# Patient Record
Sex: Male | Born: 1945
Health system: Southern US, Community
[De-identification: ages and names within clinical notes are randomized; demographics above are authoritative.]

## PROBLEM LIST (undated history)

## (undated) DIAGNOSIS — I10 Essential (primary) hypertension: Secondary | ICD-10-CM

## (undated) DIAGNOSIS — E785 Hyperlipidemia, unspecified: Secondary | ICD-10-CM

## (undated) DIAGNOSIS — J90 Pleural effusion, not elsewhere classified: Secondary | ICD-10-CM

## (undated) DIAGNOSIS — I214 Non-ST elevation (NSTEMI) myocardial infarction: Secondary | ICD-10-CM

## (undated) DIAGNOSIS — R570 Cardiogenic shock: Secondary | ICD-10-CM

## (undated) DIAGNOSIS — I209 Angina pectoris, unspecified: Secondary | ICD-10-CM

## (undated) DIAGNOSIS — R0602 Shortness of breath: Secondary | ICD-10-CM

## (undated) DIAGNOSIS — M1990 Unspecified osteoarthritis, unspecified site: Secondary | ICD-10-CM

## (undated) DIAGNOSIS — Z951 Presence of aortocoronary bypass graft: Secondary | ICD-10-CM

## (undated) DIAGNOSIS — I219 Acute myocardial infarction, unspecified: Secondary | ICD-10-CM

## (undated) DIAGNOSIS — I479 Paroxysmal tachycardia, unspecified: Secondary | ICD-10-CM

## (undated) DIAGNOSIS — J9690 Respiratory failure, unspecified, unspecified whether with hypoxia or hypercapnia: Secondary | ICD-10-CM

## (undated) DIAGNOSIS — C61 Malignant neoplasm of prostate: Secondary | ICD-10-CM

## (undated) DIAGNOSIS — R001 Bradycardia, unspecified: Secondary | ICD-10-CM

## (undated) DIAGNOSIS — R609 Edema, unspecified: Secondary | ICD-10-CM

## (undated) DIAGNOSIS — I2510 Atherosclerotic heart disease of native coronary artery without angina pectoris: Secondary | ICD-10-CM

## (undated) HISTORY — DX: Respiratory failure, unspecified, unspecified whether with hypoxia or hypercapnia: J96.90

## (undated) HISTORY — DX: Non-ST elevation (NSTEMI) myocardial infarction: I21.4

## (undated) HISTORY — DX: Presence of aortocoronary bypass graft: Z95.1

## (undated) HISTORY — DX: Malignant neoplasm of prostate: C61

## (undated) HISTORY — DX: Bradycardia, unspecified: R00.1

## (undated) HISTORY — DX: Pleural effusion, not elsewhere classified: J90

## (undated) HISTORY — DX: Shortness of breath: R06.02

## (undated) HISTORY — DX: Cardiogenic shock: R57.0

## (undated) HISTORY — DX: Hyperlipidemia, unspecified: E78.5

## (undated) HISTORY — DX: Edema, unspecified: R60.9

## (undated) HISTORY — DX: Paroxysmal tachycardia, unspecified: I47.9

## (undated) HISTORY — DX: Angina pectoris, unspecified: I20.9

---

## 2015-06-12 DIAGNOSIS — Z888 Allergy status to other drugs, medicaments and biological substances status: Secondary | ICD-10-CM | POA: Diagnosis not present

## 2015-06-12 DIAGNOSIS — Z79899 Other long term (current) drug therapy: Secondary | ICD-10-CM | POA: Diagnosis not present

## 2015-06-12 DIAGNOSIS — E860 Dehydration: Secondary | ICD-10-CM | POA: Diagnosis present

## 2015-06-12 DIAGNOSIS — Z85828 Personal history of other malignant neoplasm of skin: Secondary | ICD-10-CM | POA: Diagnosis not present

## 2015-06-12 DIAGNOSIS — J156 Pneumonia due to other aerobic Gram-negative bacteria: Secondary | ICD-10-CM | POA: Diagnosis present

## 2015-06-12 DIAGNOSIS — F329 Major depressive disorder, single episode, unspecified: Secondary | ICD-10-CM | POA: Diagnosis present

## 2015-06-12 DIAGNOSIS — I10 Essential (primary) hypertension: Secondary | ICD-10-CM | POA: Diagnosis not present

## 2015-06-12 DIAGNOSIS — K6289 Other specified diseases of anus and rectum: Secondary | ICD-10-CM | POA: Diagnosis not present

## 2015-06-12 DIAGNOSIS — K529 Noninfective gastroenteritis and colitis, unspecified: Secondary | ICD-10-CM | POA: Diagnosis not present

## 2015-06-12 DIAGNOSIS — J690 Pneumonitis due to inhalation of food and vomit: Secondary | ICD-10-CM | POA: Diagnosis not present

## 2015-06-12 DIAGNOSIS — R0902 Hypoxemia: Secondary | ICD-10-CM | POA: Diagnosis not present

## 2015-06-12 DIAGNOSIS — A419 Sepsis, unspecified organism: Secondary | ICD-10-CM | POA: Diagnosis not present

## 2015-06-12 DIAGNOSIS — K219 Gastro-esophageal reflux disease without esophagitis: Secondary | ICD-10-CM | POA: Diagnosis present

## 2015-06-12 DIAGNOSIS — K5730 Diverticulosis of large intestine without perforation or abscess without bleeding: Secondary | ICD-10-CM | POA: Diagnosis not present

## 2015-06-12 DIAGNOSIS — R1110 Vomiting, unspecified: Secondary | ICD-10-CM | POA: Diagnosis not present

## 2015-06-12 DIAGNOSIS — M1990 Unspecified osteoarthritis, unspecified site: Secondary | ICD-10-CM | POA: Diagnosis present

## 2015-06-12 DIAGNOSIS — J189 Pneumonia, unspecified organism: Secondary | ICD-10-CM | POA: Diagnosis not present

## 2015-06-12 DIAGNOSIS — J9811 Atelectasis: Secondary | ICD-10-CM | POA: Diagnosis not present

## 2015-06-12 DIAGNOSIS — R6521 Severe sepsis with septic shock: Secondary | ICD-10-CM | POA: Diagnosis not present

## 2015-06-12 DIAGNOSIS — A09 Infectious gastroenteritis and colitis, unspecified: Secondary | ICD-10-CM | POA: Diagnosis not present

## 2015-06-12 DIAGNOSIS — R112 Nausea with vomiting, unspecified: Secondary | ICD-10-CM | POA: Diagnosis not present

## 2015-06-12 DIAGNOSIS — N179 Acute kidney failure, unspecified: Secondary | ICD-10-CM | POA: Diagnosis not present

## 2015-10-29 DIAGNOSIS — M5090 Cervical disc disorder, unspecified, unspecified cervical region: Secondary | ICD-10-CM

## 2015-10-29 DIAGNOSIS — M542 Cervicalgia: Secondary | ICD-10-CM

## 2015-10-29 DIAGNOSIS — R29898 Other symptoms and signs involving the musculoskeletal system: Secondary | ICD-10-CM

## 2015-10-29 DIAGNOSIS — R531 Weakness: Secondary | ICD-10-CM

## 2015-10-29 DIAGNOSIS — R200 Anesthesia of skin: Secondary | ICD-10-CM | POA: Insufficient documentation

## 2015-10-29 DIAGNOSIS — R202 Paresthesia of skin: Secondary | ICD-10-CM | POA: Insufficient documentation

## 2015-10-29 HISTORY — DX: Cervicalgia: M54.2

## 2015-10-29 HISTORY — DX: Anesthesia of skin: R20.0

## 2015-10-29 HISTORY — DX: Weakness: R53.1

## 2015-10-29 HISTORY — DX: Other symptoms and signs involving the musculoskeletal system: R29.898

## 2015-10-29 HISTORY — DX: Cervical disc disorder, unspecified, unspecified cervical region: M50.90

## 2016-09-23 DIAGNOSIS — E785 Hyperlipidemia, unspecified: Secondary | ICD-10-CM | POA: Diagnosis present

## 2016-09-23 DIAGNOSIS — I10 Essential (primary) hypertension: Secondary | ICD-10-CM | POA: Diagnosis present

## 2016-09-23 DIAGNOSIS — W19XXXA Unspecified fall, initial encounter: Secondary | ICD-10-CM | POA: Diagnosis not present

## 2016-09-23 DIAGNOSIS — N19 Unspecified kidney failure: Secondary | ICD-10-CM | POA: Diagnosis not present

## 2016-09-23 DIAGNOSIS — K219 Gastro-esophageal reflux disease without esophagitis: Secondary | ICD-10-CM | POA: Diagnosis present

## 2016-09-23 DIAGNOSIS — Z9181 History of falling: Secondary | ICD-10-CM | POA: Diagnosis not present

## 2016-09-23 DIAGNOSIS — I214 Non-ST elevation (NSTEMI) myocardial infarction: Secondary | ICD-10-CM | POA: Diagnosis present

## 2016-09-23 DIAGNOSIS — Z79899 Other long term (current) drug therapy: Secondary | ICD-10-CM | POA: Diagnosis not present

## 2016-09-23 DIAGNOSIS — N179 Acute kidney failure, unspecified: Secondary | ICD-10-CM | POA: Diagnosis present

## 2016-09-24 ENCOUNTER — Other Ambulatory Visit: Payer: Self-pay | Admitting: *Deleted

## 2016-09-24 ENCOUNTER — Encounter (HOSPITAL_COMMUNITY)
Admission: AD | Disposition: A | Discharge status: Home Health | Payer: Self-pay | Source: Other Acute Inpatient Hospital | Attending: Cardiothoracic Surgery

## 2016-09-24 ENCOUNTER — Encounter (HOSPITAL_COMMUNITY): Payer: Self-pay | Admitting: Cardiology

## 2016-09-24 ENCOUNTER — Inpatient Hospital Stay (HOSPITAL_COMMUNITY)
Admission: AD | Admit: 2016-09-24 | Discharge: 2016-10-10 | DRG: 280 | Disposition: A | Discharge status: Home Health | Payer: BLUE CROSS/BLUE SHIELD | Source: Other Acute Inpatient Hospital | Attending: Cardiothoracic Surgery | Admitting: Cardiothoracic Surgery

## 2016-09-24 DIAGNOSIS — R0902 Hypoxemia: Secondary | ICD-10-CM

## 2016-09-24 DIAGNOSIS — I252 Old myocardial infarction: Secondary | ICD-10-CM

## 2016-09-24 DIAGNOSIS — J90 Pleural effusion, not elsewhere classified: Secondary | ICD-10-CM | POA: Diagnosis not present

## 2016-09-24 DIAGNOSIS — Z79899 Other long term (current) drug therapy: Secondary | ICD-10-CM

## 2016-09-24 DIAGNOSIS — J9601 Acute respiratory failure with hypoxia: Secondary | ICD-10-CM | POA: Diagnosis not present

## 2016-09-24 DIAGNOSIS — H9190 Unspecified hearing loss, unspecified ear: Secondary | ICD-10-CM | POA: Diagnosis present

## 2016-09-24 DIAGNOSIS — N183 Chronic kidney disease, stage 3 (moderate): Secondary | ICD-10-CM | POA: Diagnosis present

## 2016-09-24 DIAGNOSIS — E784 Other hyperlipidemia: Secondary | ICD-10-CM | POA: Diagnosis not present

## 2016-09-24 DIAGNOSIS — E8770 Fluid overload, unspecified: Secondary | ICD-10-CM | POA: Diagnosis not present

## 2016-09-24 DIAGNOSIS — D689 Coagulation defect, unspecified: Secondary | ICD-10-CM | POA: Diagnosis not present

## 2016-09-24 DIAGNOSIS — I25119 Atherosclerotic heart disease of native coronary artery with unspecified angina pectoris: Secondary | ICD-10-CM | POA: Diagnosis not present

## 2016-09-24 DIAGNOSIS — Z951 Presence of aortocoronary bypass graft: Secondary | ICD-10-CM

## 2016-09-24 DIAGNOSIS — J385 Laryngeal spasm: Secondary | ICD-10-CM | POA: Diagnosis not present

## 2016-09-24 DIAGNOSIS — J9811 Atelectasis: Secondary | ICD-10-CM | POA: Diagnosis not present

## 2016-09-24 DIAGNOSIS — Z01818 Encounter for other preprocedural examination: Secondary | ICD-10-CM

## 2016-09-24 DIAGNOSIS — R05 Cough: Secondary | ICD-10-CM | POA: Diagnosis not present

## 2016-09-24 DIAGNOSIS — Z888 Allergy status to other drugs, medicaments and biological substances status: Secondary | ICD-10-CM | POA: Diagnosis not present

## 2016-09-24 DIAGNOSIS — N179 Acute kidney failure, unspecified: Secondary | ICD-10-CM | POA: Diagnosis not present

## 2016-09-24 DIAGNOSIS — Z6835 Body mass index (BMI) 35.0-35.9, adult: Secondary | ICD-10-CM

## 2016-09-24 DIAGNOSIS — R0602 Shortness of breath: Secondary | ICD-10-CM

## 2016-09-24 DIAGNOSIS — I10 Essential (primary) hypertension: Secondary | ICD-10-CM

## 2016-09-24 DIAGNOSIS — D696 Thrombocytopenia, unspecified: Secondary | ICD-10-CM | POA: Diagnosis not present

## 2016-09-24 DIAGNOSIS — M1990 Unspecified osteoarthritis, unspecified site: Secondary | ICD-10-CM | POA: Diagnosis present

## 2016-09-24 DIAGNOSIS — R739 Hyperglycemia, unspecified: Secondary | ICD-10-CM | POA: Diagnosis not present

## 2016-09-24 DIAGNOSIS — E871 Hypo-osmolality and hyponatremia: Secondary | ICD-10-CM | POA: Diagnosis not present

## 2016-09-24 DIAGNOSIS — I25110 Atherosclerotic heart disease of native coronary artery with unstable angina pectoris: Secondary | ICD-10-CM | POA: Diagnosis present

## 2016-09-24 DIAGNOSIS — D62 Acute posthemorrhagic anemia: Secondary | ICD-10-CM | POA: Diagnosis not present

## 2016-09-24 DIAGNOSIS — I110 Hypertensive heart disease with heart failure: Secondary | ICD-10-CM | POA: Diagnosis present

## 2016-09-24 DIAGNOSIS — W19XXXA Unspecified fall, initial encounter: Secondary | ICD-10-CM

## 2016-09-24 DIAGNOSIS — E669 Obesity, unspecified: Secondary | ICD-10-CM | POA: Diagnosis present

## 2016-09-24 DIAGNOSIS — E785 Hyperlipidemia, unspecified: Secondary | ICD-10-CM | POA: Diagnosis present

## 2016-09-24 DIAGNOSIS — R0789 Other chest pain: Secondary | ICD-10-CM | POA: Diagnosis present

## 2016-09-24 DIAGNOSIS — I480 Paroxysmal atrial fibrillation: Secondary | ICD-10-CM | POA: Diagnosis present

## 2016-09-24 DIAGNOSIS — I2510 Atherosclerotic heart disease of native coronary artery without angina pectoris: Secondary | ICD-10-CM | POA: Diagnosis not present

## 2016-09-24 DIAGNOSIS — I214 Non-ST elevation (NSTEMI) myocardial infarction: Secondary | ICD-10-CM | POA: Diagnosis not present

## 2016-09-24 DIAGNOSIS — J939 Pneumothorax, unspecified: Secondary | ICD-10-CM

## 2016-09-24 DIAGNOSIS — J9801 Acute bronchospasm: Secondary | ICD-10-CM | POA: Diagnosis not present

## 2016-09-24 DIAGNOSIS — R570 Cardiogenic shock: Secondary | ICD-10-CM | POA: Diagnosis not present

## 2016-09-24 DIAGNOSIS — N19 Unspecified kidney failure: Secondary | ICD-10-CM

## 2016-09-24 DIAGNOSIS — Z881 Allergy status to other antibiotic agents status: Secondary | ICD-10-CM

## 2016-09-24 DIAGNOSIS — J9690 Respiratory failure, unspecified, unspecified whether with hypoxia or hypercapnia: Secondary | ICD-10-CM

## 2016-09-24 DIAGNOSIS — R001 Bradycardia, unspecified: Secondary | ICD-10-CM | POA: Diagnosis not present

## 2016-09-24 DIAGNOSIS — Z01810 Encounter for preprocedural cardiovascular examination: Secondary | ICD-10-CM | POA: Diagnosis not present

## 2016-09-24 DIAGNOSIS — I479 Paroxysmal tachycardia, unspecified: Secondary | ICD-10-CM | POA: Diagnosis present

## 2016-09-24 DIAGNOSIS — Z8249 Family history of ischemic heart disease and other diseases of the circulatory system: Secondary | ICD-10-CM | POA: Diagnosis not present

## 2016-09-24 DIAGNOSIS — I5043 Acute on chronic combined systolic (congestive) and diastolic (congestive) heart failure: Secondary | ICD-10-CM | POA: Diagnosis present

## 2016-09-24 DIAGNOSIS — K219 Gastro-esophageal reflux disease without esophagitis: Secondary | ICD-10-CM | POA: Diagnosis present

## 2016-09-24 DIAGNOSIS — I360 Nonrheumatic tricuspid (valve) stenosis: Secondary | ICD-10-CM | POA: Diagnosis not present

## 2016-09-24 DIAGNOSIS — E663 Overweight: Secondary | ICD-10-CM | POA: Diagnosis present

## 2016-09-24 DIAGNOSIS — J810 Acute pulmonary edema: Secondary | ICD-10-CM | POA: Diagnosis not present

## 2016-09-24 DIAGNOSIS — I471 Supraventricular tachycardia: Secondary | ICD-10-CM | POA: Diagnosis not present

## 2016-09-24 DIAGNOSIS — I509 Heart failure, unspecified: Secondary | ICD-10-CM

## 2016-09-24 DIAGNOSIS — E876 Hypokalemia: Secondary | ICD-10-CM | POA: Diagnosis present

## 2016-09-24 DIAGNOSIS — I2584 Coronary atherosclerosis due to calcified coronary lesion: Secondary | ICD-10-CM | POA: Diagnosis present

## 2016-09-24 DIAGNOSIS — R112 Nausea with vomiting, unspecified: Secondary | ICD-10-CM | POA: Diagnosis not present

## 2016-09-24 DIAGNOSIS — Z9889 Other specified postprocedural states: Secondary | ICD-10-CM

## 2016-09-24 DIAGNOSIS — K567 Ileus, unspecified: Secondary | ICD-10-CM

## 2016-09-24 HISTORY — PX: LEFT HEART CATH AND CORONARY ANGIOGRAPHY: CATH118249

## 2016-09-24 HISTORY — DX: Essential (primary) hypertension: I10

## 2016-09-24 HISTORY — DX: Hyperlipidemia, unspecified: E78.5

## 2016-09-24 HISTORY — DX: Atherosclerotic heart disease of native coronary artery without angina pectoris: I25.10

## 2016-09-24 HISTORY — DX: Acute myocardial infarction, unspecified: I21.9

## 2016-09-24 HISTORY — DX: Angina pectoris, unspecified: I20.9

## 2016-09-24 HISTORY — DX: Non-ST elevation (NSTEMI) myocardial infarction: I21.4

## 2016-09-24 HISTORY — DX: Unspecified osteoarthritis, unspecified site: M19.90

## 2016-09-24 LAB — HEPARIN LEVEL (UNFRACTIONATED): Heparin Unfractionated: 0.45 [IU]/mL (ref 0.30–0.70)

## 2016-09-24 LAB — MRSA PCR SCREENING: MRSA by PCR: NEGATIVE

## 2016-09-24 SURGERY — LEFT HEART CATH AND CORONARY ANGIOGRAPHY
Anesthesia: LOCAL

## 2016-09-24 MED ORDER — SODIUM CHLORIDE 0.9% FLUSH
3.0000 mL | INTRAVENOUS | Status: DC | PRN
  Administered 2016-09-24: 3 mL via INTRAVENOUS
  Filled 2016-09-24: qty 3

## 2016-09-24 MED ORDER — ASPIRIN EC 81 MG PO TBEC
81.0000 mg | DELAYED_RELEASE_TABLET | Freq: Every day | ORAL | Status: DC
  Administered 2016-09-25 – 2016-09-27 (×3): 81 mg via ORAL
  Filled 2016-09-24 (×4): qty 1

## 2016-09-24 MED ORDER — SODIUM CHLORIDE 0.9 % IV SOLN: 250.0000 mL | INTRAVENOUS | Status: DC | PRN

## 2016-09-24 MED ORDER — VERAPAMIL HCL 2.5 MG/ML IV SOLN
INTRA_ARTERIAL | Status: DC | PRN
  Administered 2016-09-24: 15 mL via INTRA_ARTERIAL

## 2016-09-24 MED ORDER — NITROGLYCERIN 0.4 MG SL SUBL
0.4000 mg | SUBLINGUAL_TABLET | SUBLINGUAL | Status: DC | PRN
  Administered 2016-09-26: 0.4 mg via SUBLINGUAL
  Filled 2016-09-24: qty 1

## 2016-09-24 MED ORDER — SODIUM CHLORIDE 0.9% FLUSH: 3.0000 mL | Freq: Two times a day (BID) | INTRAVENOUS | Status: DC

## 2016-09-24 MED ORDER — PANTOPRAZOLE SODIUM 40 MG PO TBEC
40.0000 mg | DELAYED_RELEASE_TABLET | Freq: Two times a day (BID) | ORAL | Status: DC
  Administered 2016-09-24 – 2016-09-27 (×7): 40 mg via ORAL
  Filled 2016-09-24 (×7): qty 1

## 2016-09-24 MED ORDER — ASPIRIN 81 MG PO CHEW
CHEWABLE_TABLET | ORAL | Status: AC
Start: 2016-09-24 — End: 2016-09-24
  Filled 2016-09-24: qty 1

## 2016-09-24 MED ORDER — ASPIRIN 81 MG PO CHEW
CHEWABLE_TABLET | ORAL | Status: DC | PRN
  Administered 2016-09-24: 324 mg via ORAL

## 2016-09-24 MED ORDER — ONDANSETRON HCL 4 MG/2ML IJ SOLN: 4.0000 mg | Freq: Four times a day (QID) | INTRAMUSCULAR | Status: DC | PRN

## 2016-09-24 MED ORDER — HEPARIN (PORCINE) IN NACL 2-0.9 UNIT/ML-% IJ SOLN
INTRAMUSCULAR | Status: AC
  Filled 2016-09-24: qty 1000

## 2016-09-24 MED ORDER — NITROGLYCERIN 1 MG/10 ML FOR IR/CATH LAB
INTRA_ARTERIAL | Status: AC
  Filled 2016-09-24: qty 10

## 2016-09-24 MED ORDER — HEPARIN SODIUM (PORCINE) 1000 UNIT/ML IJ SOLN
INTRAMUSCULAR | Status: AC
  Filled 2016-09-24: qty 1

## 2016-09-24 MED ORDER — ATORVASTATIN CALCIUM 80 MG PO TABS
80.0000 mg | ORAL_TABLET | Freq: Every day | ORAL | Status: DC
  Administered 2016-09-24 – 2016-10-09 (×14): 80 mg via ORAL
  Filled 2016-09-24 (×14): qty 1

## 2016-09-24 MED ORDER — ASPIRIN 81 MG PO CHEW: 81.0000 mg | CHEWABLE_TABLET | ORAL | Status: DC

## 2016-09-24 MED ORDER — SODIUM CHLORIDE 0.9 % WEIGHT BASED INFUSION: 3.0000 mL/kg/h | INTRAVENOUS | Status: DC

## 2016-09-24 MED ORDER — IOPAMIDOL (ISOVUE-370) INJECTION 76%
INTRAVENOUS | Status: DC | PRN
  Administered 2016-09-24: 90 mL via INTRA_ARTERIAL

## 2016-09-24 MED ORDER — SODIUM CHLORIDE 0.9 % IV SOLN
250.0000 mL | INTRAVENOUS | Status: DC | PRN
Start: 2016-09-24 — End: 2016-09-24

## 2016-09-24 MED ORDER — METOPROLOL SUCCINATE ER 50 MG PO TB24
50.0000 mg | ORAL_TABLET | Freq: Every day | ORAL | Status: DC
  Administered 2016-09-24 – 2016-09-27 (×4): 50 mg via ORAL
  Filled 2016-09-24 (×5): qty 1

## 2016-09-24 MED ORDER — SODIUM CHLORIDE 0.9% FLUSH
3.0000 mL | Freq: Two times a day (BID) | INTRAVENOUS | Status: DC
  Administered 2016-09-24 – 2016-09-27 (×6): 3 mL via INTRAVENOUS

## 2016-09-24 MED ORDER — VERAPAMIL HCL 2.5 MG/ML IV SOLN
INTRAVENOUS | Status: AC
  Filled 2016-09-24: qty 2

## 2016-09-24 MED ORDER — LIDOCAINE HCL (PF) 1 % IJ SOLN
INTRAMUSCULAR | Status: DC | PRN
  Administered 2016-09-24: 3 mL

## 2016-09-24 MED ORDER — HEPARIN (PORCINE) IN NACL 2-0.9 UNIT/ML-% IJ SOLN
INTRAMUSCULAR | Status: AC | PRN
  Administered 2016-09-24: 1000 mL

## 2016-09-24 MED ORDER — ASPIRIN 81 MG PO CHEW
CHEWABLE_TABLET | ORAL | Status: AC
  Filled 2016-09-24: qty 1

## 2016-09-24 MED ORDER — ACETAMINOPHEN 325 MG PO TABS
650.0000 mg | ORAL_TABLET | ORAL | Status: DC | PRN
  Administered 2016-09-24 – 2016-09-26 (×3): 650 mg via ORAL
  Filled 2016-09-24 (×3): qty 2

## 2016-09-24 MED ORDER — MORPHINE SULFATE (PF) 4 MG/ML IV SOLN: 2.0000 mg | INTRAVENOUS | Status: DC | PRN

## 2016-09-24 MED ORDER — HEPARIN (PORCINE) IN NACL 100-0.45 UNIT/ML-% IJ SOLN
1200.0000 [IU]/h | INTRAMUSCULAR | Status: DC
  Administered 2016-09-24 – 2016-09-27 (×3): 1200 [IU]/h via INTRAVENOUS
  Filled 2016-09-24 (×4): qty 250

## 2016-09-24 MED ORDER — ACETAMINOPHEN 325 MG PO TABS: 650.0000 mg | ORAL_TABLET | ORAL | Status: DC | PRN

## 2016-09-24 MED ORDER — HEPARIN SODIUM (PORCINE) 1000 UNIT/ML IJ SOLN
INTRAMUSCULAR | Status: DC | PRN
  Administered 2016-09-24: 4500 [IU] via INTRAVENOUS

## 2016-09-24 MED ORDER — SODIUM CHLORIDE 0.9 % IV SOLN: INTRAVENOUS | Status: AC

## 2016-09-24 MED ORDER — SODIUM CHLORIDE 0.9 % WEIGHT BASED INFUSION: 1.0000 mL/kg/h | INTRAVENOUS | Status: DC

## 2016-09-24 MED ORDER — LIDOCAINE HCL 1 % IJ SOLN
INTRAMUSCULAR | Status: AC
  Filled 2016-09-24: qty 20

## 2016-09-24 MED ORDER — ASPIRIN 81 MG PO CHEW: 81.0000 mg | CHEWABLE_TABLET | Freq: Every day | ORAL | Status: DC

## 2016-09-24 MED ORDER — SODIUM CHLORIDE 0.9% FLUSH: 3.0000 mL | INTRAVENOUS | Status: DC | PRN

## 2016-09-24 SURGICAL SUPPLY — 12 items
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
## 2016-09-24 NOTE — Progress Notes (Signed)
ANTICOAGULATION CONSULT NOTE - Initial Consult  Pharmacy Consult for heparin Indication: CAD- awaiting TCTS eval  Allergies  Allergen Reactions  . Benadryl [Diphenhydramine Hcl (Sleep)]   . Levofloxacin     Patient Measurements:  Reported weight from [facility] = 198 lbs (90 kg) Heparin Dosing Weight: 85 kg  Vital Signs: BP: 116/72 (06/07 1200) Pulse Rate: 58 (06/07 1200)  Labs: From [facility] records: Hgb 13.6, Pltc 159 Scr 1.2 INR 1.1  Medical History: Past Medical History:  Diagnosis Date  . Hyperlipidemia   . Hypertension     Medications:  Infusions:   Assessment: 71 yo male transferred from [facility] on IV heparin at 1200 units/hr.  Heparin turned off for cath lab.  Found with multivessel CAD, awaiting surgical evaluation.  Pharmacy asked to resume IV heparin 2 hrs after radial sheath removed (removed at 1415 PM).  Goal of Therapy:  Heparin level 0.3-0.7 units/ml Monitor platelets by anticoagulation protocol: Yes   Plan:  1. Restart IV heparin at 1615 PM at a rate of 1200 units/hr. 2. Check heparin level 6 hrs after gtt started. 3. Daily heparin level and CBC. 4. F/u plans for surgery.  Uvaldo Rising, BCPS  Clinical Pharmacist Pager [phone]  09/24/2016 3:03 PM

## 2016-09-24 NOTE — Progress Notes (Signed)
Patient admitted to holding area pre cath from [facility] by EMS. Arrived on NTG drip at 49mcg/min and heparin drip at 1100U/hr w/0.9NS at 125cc/hr to left forearm. Saline lock rt ac.

## 2016-09-24 NOTE — Progress Notes (Signed)
ANTICOAGULATION CONSULT NOTE - Follow Up Consult  Pharmacy Consult for Heparin  Indication: CAD awaiting surgical eval  Allergies  Allergen Reactions  . Benadryl [Diphenhydramine Hcl (Sleep)]   . Levofloxacin     Patient Measurements: Weight: 203 lb 11.3 oz (92.4 kg)  Vital Signs: Temp: 98.6 F (37 C) (06/07 2012) Temp Source: Oral (06/07 2012) BP: 121/69 (06/07 2012) Pulse Rate: 66 (06/07 2012)  Labs:  Recent Labs  09/24/16 2225  HEPARINUNFRC 0.45    Assessment: 71 y/o M with multi-vessel CAD awaiting TCTS consult, heparin level therapeutic x 1   Goal of Therapy:  Heparin level 0.3-0.7 units/ml Monitor platelets by anticoagulation protocol: Yes   Plan:  -Cont heparin at 1200 units/hr -AM HL  Narda Bonds 09/24/2016,11:27 PM

## 2016-09-25 ENCOUNTER — Inpatient Hospital Stay (HOSPITAL_COMMUNITY): Payer: BLUE CROSS/BLUE SHIELD

## 2016-09-25 ENCOUNTER — Encounter (HOSPITAL_COMMUNITY): Payer: Self-pay | Admitting: *Deleted

## 2016-09-25 DIAGNOSIS — Z01810 Encounter for preprocedural cardiovascular examination: Secondary | ICD-10-CM

## 2016-09-25 DIAGNOSIS — E876 Hypokalemia: Secondary | ICD-10-CM

## 2016-09-25 DIAGNOSIS — E784 Other hyperlipidemia: Secondary | ICD-10-CM

## 2016-09-25 DIAGNOSIS — I10 Essential (primary) hypertension: Secondary | ICD-10-CM

## 2016-09-25 DIAGNOSIS — I25110 Atherosclerotic heart disease of native coronary artery with unstable angina pectoris: Secondary | ICD-10-CM

## 2016-09-25 DIAGNOSIS — I214 Non-ST elevation (NSTEMI) myocardial infarction: Secondary | ICD-10-CM

## 2016-09-25 DIAGNOSIS — I2510 Atherosclerotic heart disease of native coronary artery without angina pectoris: Secondary | ICD-10-CM

## 2016-09-25 LAB — ECHOCARDIOGRAM COMPLETE
Ao-asc: 35 cm
Area-P 1/2: 3.38 cm2
E decel time: 222 msec
E/e' ratio: 10.96
FS: 33 % (ref 28–44)
Height: 66 in
IVS/LV PW RATIO, ED: 1.44
LA ID, A-P, ES: 35 mm
LA diam end sys: 35 mm
LA diam index: 1.66 cm/m2
LA vol A4C: 42.8 ml
LA vol index: 22.8 mL/m2
LA vol: 48 mL
LV E/e' medial: 10.96
LV E/e'average: 10.96
LV PW d: 9 mm — AB (ref 0.6–1.1)
LV dias vol index: 45 mL/m2
LV dias vol: 95 mL (ref 62–150)
LV e' LATERAL: 9.03 cm/s
LV sys vol index: 15 mL/m2
LV sys vol: 32 mL (ref 21–61)
LVOT SV: 97 mL
LVOT VTI: 31 cm
LVOT area: 3.14 cm2
LVOT diameter: 20 mm
LVOT peak grad rest: 8 mmHg
LVOT peak vel: 138 cm/s
Lateral S' vel: 12.7 cm/s
MV Dec: 222
MV Peak grad: 4 mmHg
MV pk A vel: 88.8 m/s
MV pk E vel: 99 m/s
P 1/2 time: 65 ms
Reg peak vel: 271 cm/s
Simpson's disk: 66
Stroke v: 63 ml
TAPSE: 22.8 mm
TDI e' lateral: 9.03
TDI e' medial: 8.05
TR max vel: 271 cm/s
Weight: 3259.28 oz

## 2016-09-25 LAB — SPIROMETRY WITH GRAPH
FEF 25-75 Post: 1.69 L/sec
FEF 25-75 Pre: 1.13 L/sec
FEF2575-%Change-Post: 49 %
FEF2575-%Pred-Post: 77 %
FEF2575-%Pred-Pre: 51 %
FEV1-%Change-Post: 10 %
FEV1-%Pred-Post: 84 %
FEV1-%Pred-Pre: 76 %
FEV1-Post: 2.39 L
FEV1-Pre: 2.16 L
FEV1FVC-%Change-Post: 9 %
FEV1FVC-%Pred-Pre: 91 %
FEV6-%Change-Post: 4 %
FEV6-%Pred-Post: 88 %
FEV6-%Pred-Pre: 84 %
FEV6-Post: 3.16 L
FEV6-Pre: 3.03 L
FEV6FVC-%Change-Post: 3 %
FEV6FVC-%Pred-Post: 104 %
FEV6FVC-%Pred-Pre: 101 %
FVC-%Change-Post: 0 %
FVC-%Pred-Post: 84 %
FVC-%Pred-Pre: 83 %
FVC-Post: 3.22 L
FVC-Pre: 3.19 L
Post FEV1/FVC ratio: 74 %
Post FEV6/FVC ratio: 98 %
Pre FEV1/FVC ratio: 68 %
Pre FEV6/FVC Ratio: 95 %

## 2016-09-25 LAB — BASIC METABOLIC PANEL
ANION GAP: 8 (ref 5–15)
BUN: 12 mg/dL (ref 6–20)
CHLORIDE: 108 mmol/L (ref 101–111)
CO2: 22 mmol/L (ref 22–32)
CREATININE: 1.2 mg/dL (ref 0.61–1.24)
Calcium: 8.1 mg/dL — ABNORMAL LOW (ref 8.9–10.3)
GFR calc non Af Amer: >60 mL/min (ref >60)
Glucose, Bld: 109 mg/dL — ABNORMAL HIGH (ref 65–99)
POTASSIUM: 3.4 mmol/L — AB (ref 3.5–5.1)
SODIUM: 138 mmol/L (ref 135–145)

## 2016-09-25 LAB — HEPARIN LEVEL (UNFRACTIONATED): HEPARIN UNFRACTIONATED: 0.43 [IU]/mL (ref 0.30–0.70)

## 2016-09-25 LAB — CBC
HEMATOCRIT: 38.8 % — AB (ref 39.0–52.0)
HEMOGLOBIN: 13 g/dL (ref 13.0–17.0)
MCH: 29.9 pg (ref 26.0–34.0)
MCHC: 33.5 g/dL (ref 30.0–36.0)
MCV: 89.2 fL (ref 78.0–100.0)
Platelets: 162 10*3/uL (ref 150–400)
RBC: 4.35 MIL/uL (ref 4.22–5.81)
RDW: 13.8 % (ref 11.5–15.5)
WBC: 9.3 10*3/uL (ref 4.0–10.5)

## 2016-09-25 LAB — URINALYSIS, ROUTINE W REFLEX MICROSCOPIC
Bilirubin Urine: NEGATIVE
Glucose, UA: NEGATIVE mg/dL
Hgb urine dipstick: NEGATIVE
Ketones, ur: NEGATIVE mg/dL
Leukocytes, UA: NEGATIVE
Nitrite: NEGATIVE
Protein, ur: NEGATIVE mg/dL
Specific Gravity, Urine: 1.014 (ref 1.005–1.030)
pH: 5 (ref 5.0–8.0)

## 2016-09-25 LAB — SURGICAL PCR SCREEN
MRSA, PCR: NEGATIVE
Staphylococcus aureus: POSITIVE — AB

## 2016-09-25 MED ORDER — SPIRONOLACTONE 25 MG PO TABS
25.0000 mg | ORAL_TABLET | Freq: Every day | ORAL | Status: DC
  Administered 2016-09-25 – 2016-09-26 (×2): 25 mg via ORAL
  Filled 2016-09-25 (×2): qty 1

## 2016-09-25 MED ORDER — POTASSIUM CHLORIDE CRYS ER 20 MEQ PO TBCR
40.0000 meq | EXTENDED_RELEASE_TABLET | Freq: Once | ORAL | Status: AC
  Administered 2016-09-25: 40 meq via ORAL
  Filled 2016-09-25: qty 2

## 2016-09-25 MED ORDER — MUPIROCIN 2 % EX OINT
TOPICAL_OINTMENT | Freq: Two times a day (BID) | CUTANEOUS | Status: DC
  Administered 2016-09-25: 21:00:00 via NASAL
  Administered 2016-09-25: 1 via NASAL
  Administered 2016-09-26 – 2016-09-27 (×4): via NASAL
  Filled 2016-09-25: qty 22

## 2016-09-25 MED ORDER — ALBUTEROL SULFATE (2.5 MG/3ML) 0.083% IN NEBU
2.5000 mg | INHALATION_SOLUTION | Freq: Once | RESPIRATORY_TRACT | Status: AC
  Administered 2016-09-25: 2.5 mg via RESPIRATORY_TRACT

## 2016-09-25 MED ORDER — CHLORHEXIDINE GLUCONATE CLOTH 2 % EX PADS
6.0000 | MEDICATED_PAD | Freq: Every day | CUTANEOUS | Status: DC
  Administered 2016-09-25 – 2016-09-27 (×3): 6 via TOPICAL

## 2016-09-25 NOTE — Consult Note (Signed)
[address]       [place] [address]             [phone]        Nation Whitter Morris Medical Record #[id number] Date of Birth: 07/01/1945  Referring: Dr Adora Fridge Primary Care:Dr Brigitte Pulse Chief Complaint:   No chief complaint on file.   Patient examined, coronary angiogram and 2-D echocardiogram images personally reviewed and counseled with patient and family  History of Present Illness:      71 year old hypertensive male with recently diagnosed severe multivessel coronary artery disease and non-ST elevation MI. Patient presented to the emergency department at [facility] with recent history of increasing heartburn. 48 hours ago he had severe chest pain while mowing the lawn and was seen in the emergency department. Cardiac enzymes were elevated with troponin of 3.8. He was seen by cardiology who recommended cardiac cath. The patient was transferred to this hospital yesterday and was cathed by Dr. Gwenlyn Found. He had three-vessel coronary disease, mild LV dysfunction, and elevated LVEDP 24 mmHg.  Echocardiogram performed today shows LVH with normal systolic function, no significant valvular disease. The patient is in sinus rhythm. 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   Zubrod Score: At the time of surgery this patient's most appropriate activity status/level should be described as: []      0    Normal activity, no symptoms [x]     1    Restricted in physical strenuous activity but ambulatory, able to do out light work [x]     2    Ambulatory and capable of self care, unable to do work activities, up and about                 more than 50%  Of the time                            []     3    Only limited self care, in bed greater than 50% of waking hours []     4    Completely disabled, no self care, confined to bed or chair []     5    Moribund  Past Medical History:  Diagnosis Date  . Anginal pain ([place])   . Arthritis   . Coronary artery disease   . Hyperlipidemia   . Hypertension   . Myocardial infarction [facility])     Past Surgical History:  Procedure Laterality Date  . LEFT HEART CATH AND CORONARY ANGIOGRAPHY N/A 09/24/2016   Procedure: Left Heart Cath and Coronary Angiography;  Surgeon: Lorretta Harp, MD;  Location: [facility];  Service: Cardiovascular;  Laterality: N/A;    History  Smoking Status  . Never Smoker  Smokeless Tobacco  . Never Used    History  Alcohol Use No    Social History   Social History  .  Marital status: N/A    Spouse name: N/A  . Number of children: N/A  . Years of education: N/A   Occupational History  . Not on file.   Social History Main Topics  . Smoking status: Never Smoker  . Smokeless tobacco: Never Used  . Alcohol use No  . Drug use: No  . Sexual activity: Yes    Birth control/ protection: None   Other Topics Concern  . Not on file   Social History Narrative  . No narrative on file    Allergies  Allergen Reactions  . Benadryl [Diphenhydramine Hcl (Sleep)]     UNSPECIFIED REACTION   . Levofloxacin     UNSPECIFIED REACTION     Current Facility-Administered Medications  Medication Dose Route Frequency Provider Last Rate Last Dose  . acetaminophen (TYLENOL) tablet 650 mg  650 mg Oral Q4H PRN Lorretta Harp, MD   650 mg at 09/24/16 2011  . aspirin EC tablet 81 mg  81 mg Oral Daily Reino Bellis  B, NP   81 mg at 09/25/16 0841  . atorvastatin (LIPITOR) tablet 80 mg  80 mg Oral q1800 Lorretta Harp, MD   80 mg at 09/24/16 1700  . Chlorhexidine Gluconate Cloth 2 % PADS 6 each  6 each Topical Daily Prescott Gum, Collier Salina, MD      . heparin ADULT infusion 100 units/mL (25000 units/259mL sodium chloride 0.45%)  1,200 Units/hr Intravenous Continuous Pat Patrick, RPH 12 mL/hr at 09/24/16 2242 1,200 Units/hr at 09/24/16 2242  . metoprolol succinate (TOPROL-XL) 24 hr tablet 50 mg  50 mg Oral Daily Reino Bellis B, NP   50 mg at 09/25/16 0839  . morphine 4 MG/ML injection 2 mg  2 mg Intravenous Q1H PRN Lorretta Harp, MD      . mupirocin ointment (BACTROBAN) 2 %   Nasal BID Prescott Gum, Collier Salina, MD      . nitroGLYCERIN (NITROSTAT) SL tablet 0.4 mg  0.4 mg Sublingual Q5 Min x 3 PRN Reino Bellis B, NP      . ondansetron [facility]) injection 4 mg  4 mg Intravenous Q6H PRN Lorretta Harp, MD      . pantoprazole (PROTONIX) EC tablet 40 mg  40 mg Oral BID Reino Bellis B, NP   40 mg at 09/25/16 0839  . sodium chloride flush (NS) 0.9 % injection 3 mL  3 mL Intravenous Q12H Lorretta Harp, MD   3 mL at 09/24/16 2136  . sodium chloride flush (NS) 0.9 % injection 3 mL  3 mL Intravenous PRN Lorretta Harp, MD   3 mL at 09/24/16 1700  . spironolactone (ALDACTONE) tablet 25 mg  25 mg Oral Daily Belva Crome, MD   25 mg at 09/25/16 1236    Prescriptions Prior to Admission  Medication Sig Dispense Refill Last Dose  . hydrOXYzine (ATARAX/VISTARIL) 25 MG tablet Take 25-50 mg by mouth at bedtime. For itching  1 Past Month at Unknown time  . lisinopril-hydrochlorothiazide (PRINZIDE,ZESTORETIC) 20-12.5 MG tablet Take 1 tablet by mouth daily.   09/23/2016 at Unknown time  . metoprolol succinate (TOPROL-XL) 50 MG 24 hr tablet Take 50 mg by mouth daily.  3 09/23/2016 at Unknown time  . pantoprazole (PROTONIX) 40 MG tablet Take 40 mg by mouth 2 (two) times daily.  3 09/23/2016 at Unknown time  . vitamin  B-12 (CYANOCOBALAMIN) 1000 MCG tablet Take 1,000 mcg by mouth daily.   09/23/2016 at Unknown time  Family History  Problem Relation Age of Onset  . Heart failure Mother   . Heart attack Father      Review of Systems:       Cardiac Review of Systems: Y or N  Chest Pain [ y   ]  Resting SOB [ n  ] Exertional SOB  [ y ]  Orthopnea [  ]   Pedal Edema [n   ]    Palpitations [ n ] Syncope  [n  ]   Presyncope [  n ]  General Review of Systems: [Y] = yes [  ]=no Constitional: recent weight change [  ]; anorexia [  ]; fatigue Blue.Reese  ]; nausea [  ]; night sweats [  ]; fever [  ]; or chills [  ]                                                               Dental: poor dentition[  ]; Last Dentist visit: 1 yr  Eye : blurred vision [  ]; diplopia [   ]; vision changes [  ];  Amaurosis fugax[  ]; Resp: cough [  ];  wheezing[  ];  hemoptysis[  ]; shortness of breath[  ]; paroxysmal nocturnal dyspnea[  ]; dyspnea on exertion[y  ]; or orthopnea[  ];  GI:  gallstones[  ], vomiting[  ];  dysphagia[  ]; melena[  ];  hematochezia [  ]; heartburn[y  ];   Hx of  Colonoscopy[  ]; GU: kidney stones [  ]; hematuria[  ];   dysuria [  ];  nocturia[  ];  history of     obstruction [  ]; urinary frequency [ y ]             Skin: rash, swelling[  ];, hair loss[  ];  peripheral edema[  ];  or itching[  ]; Musculosketetal: myalgias[  ];  joint swelling[  ];  joint erythema[  ];  joint pain[  ];  back pain[ mild ];  Heme/Lymph: bruising[  ];  bleeding[  ];  anemia[  ];  Neuro: TIA[  ];  headaches[  ];  stroke[  ];  vertigo[  ];  seizures[  ];   paresthesias[  ];  difficulty walking[  ];  Psych:depression[  ]; anxiety[  ];  Endocrine: diabetes[  ];  thyroid dysfunction[  ];  Immunizations: Flu [  ]; Pneumococcal[  ];  Other:right hand dominant, no prior surgery, no hx of thoracic trauma  Physical Exam: BP 114/68 (BP Location: Right Arm)   Pulse 67   Temp 97.7 F (36.5 C) (Axillary)   Resp 15   Ht 5\' 6"  (1.676 m)    Wt 203 lb 11.3 oz (92.4 kg)   SpO2 98%   BMI 32.88 kg/m   .    Physical Exam  General: Overweight 71 year old male no acute distress HEENT: Normocephalic pupils equal , dentition adequate Neck: Supple without JVD, adenopathy, or bruit Chest: Clear to auscultation, symmetrical breath sounds, no rhonchi, no tenderness             or deformity Cardiovascular: Regular rate and rhythm, no murmur, no gallop, peripheral pulses             palpable in all  extremities Abdomen:  Soft, nontender, no palpable mass or organomegaly Extremities: Warm, well-perfused, no clubbing cyanosis edema or tenderness,              no venous stasis changes of the legs Rectal/GU: Deferred Neuro: Grossly non--focal and symmetrical throughout Skin: Clean and dry without rash or ulceration    Diagnostic Studies & Laboratory data:   Cardiac cath, echocardiogram data as described above  Recent Radiology Findings:   No results found.   I have independently reviewed the above radiologic studies. Chest x-ray reveals clear lung fields mild cardiomegaly Recent Lab Findings: Lab Results  Component Value Date   WBC 9.3 09/25/2016   HGB 13.0 09/25/2016   HCT 38.8 (L) 09/25/2016   PLT 162 09/25/2016   GLUCOSE 109 (H) 09/25/2016   NA 138 09/25/2016   K 3.4 (L) 09/25/2016   CL 108 09/25/2016   CREATININE 1.20 09/25/2016   BUN 12 09/25/2016   CO2 22 09/25/2016      Assessment / Plan:      Non-ST elevation MI Severe three-vessel coronary artery disease Hypertension Preserved LV systolic function  Patient is appropriate candidate for CABG and agree with recommendations for his surgical coronary revascularization. Surgery will be scheduled for Monday, June 11. I discussed the procedure in detail the patient has family including indications benefits alternatives and risks.     @ME1 @ 09/25/2016 3:32 PM

## 2016-09-25 NOTE — Progress Notes (Signed)
ANTICOAGULATION CONSULT NOTE - Follow Up Consult  Pharmacy Consult for Heparin Indication: CAD  Allergies  Allergen Reactions  . Benadryl [Diphenhydramine Hcl (Sleep)]   . Levofloxacin     Patient Measurements: Height: 5\' 6"  (167.6 cm) Weight: 203 lb 11.3 oz (92.4 kg) IBW/kg (Calculated) : 63.8 Heparin Dosing Weight:    Vital Signs: Temp: 99.3 F (37.4 C) (06/08 0800) Temp Source: Oral (06/08 0800) BP: 119/68 (06/08 0839) Pulse Rate: 67 (06/08 0839)  Labs:  Recent Labs  09/24/16 2225 09/25/16 0455  HGB  --  13.0  HCT  --  38.8*  PLT  --  162  HEPARINUNFRC 0.45 0.43  CREATININE  --  1.20    Estimated Creatinine Clearance: 62.7 mL/min (by C-G formula based on SCr of 1.2 mg/dL).   Assessment:  Anticoag: CAD. HL 0.43. CBC WNL  Goal of Therapy:  Heparin level 0.3-0.7 units/ml Monitor platelets by anticoagulation protocol: Yes   Plan:  Cont heparin 1200 units/hr Daily HL and CBC   Britani Beattie S. Alford Highland, PharmD, BCPS Clinical Staff Pharmacist Pager [phone]  Eilene Ghazi Stillinger 09/25/2016,10:07 AM

## 2016-09-25 NOTE — Progress Notes (Signed)
Progress Note  Patient Name: Ernest Booker Date of Encounter: 09/25/2016  Primary Cardiologist: Adora Fridge  Subjective   Presented from the [facility] emergency room. Elevated markers and underwent coronary angiography yesterday. Coronary bypass grafting recommended by Dr.Berry.  Inpatient Medications    Scheduled Meds: . aspirin EC  81 mg Oral Daily  . atorvastatin  80 mg Oral q1800  . metoprolol succinate  50 mg Oral Daily  . pantoprazole  40 mg Oral BID  . sodium chloride flush  3 mL Intravenous Q12H   Continuous Infusions: . heparin 1,200 Units/hr (09/24/16 2242)   PRN Meds: acetaminophen, morphine injection, nitroGLYCERIN, ondansetron (ZOFRAN) IV, sodium chloride flush   Vital Signs    Vitals:   09/25/16 0500 09/25/16 0600 09/25/16 0800 09/25/16 0839  BP: 103/65 118/75 119/68 119/68  Pulse: (!) 59 (!) 58 63 67  Resp: (!) 23 18 16    Temp:   99.3 F (37.4 C)   TempSrc:   Oral   SpO2: 97% 96% 96%   Weight:      Height:  5\' 6"  (1.676 m)      Intake/Output Summary (Last 24 hours) at 09/25/16 0942 Last data filed at 09/25/16 0538  Gross per 24 hour  Intake             1074 ml  Output             2225 ml  Net            -1151 ml   Filed Weights   09/24/16 1513  Weight: 203 lb 11.3 oz (92.4 kg)    Telemetry    Normal sinus rhythm without ectopy. - Personally Reviewed  ECG    None available - Personally Reviewed  Physical Exam   GEN: No acute distress.   Neck: No JVD Cardiac: RRR, no murmurs, rubs, or gallops.  Respiratory: Clear to auscultation bilaterally. GI: Soft, nontender, non-distended  MS: No edema; No deformity. Neuro:  Nonfocal . Very hard of hearing Psych: Normal affect   Labs    Chemistry Recent Labs Lab 09/25/16 0455  NA 138  K 3.4*  CL 108  CO2 22  GLUCOSE 109*  BUN 12  CREATININE 1.20  CALCIUM 8.1*  GFRNONAA >60  GFRAA >60  ANIONGAP 8     Hematology Recent Labs Lab 09/25/16 0455  WBC 9.3  RBC 4.35  HGB  13.0  HCT 38.8*  MCV 89.2  MCH 29.9  MCHC 33.5  RDW 13.8  PLT 162    Cardiac EnzymesNo results for input(s): TROPONINI in the last 168 hours. No results for input(s): TROPIPOC in the last 168 hours.   BNPNo results for input(s): BNP, PROBNP in the last 168 hours.   DDimer No results for input(s): DDIMER in the last 168 hours.   Radiology    No results found.  Cardiac Studies   Coronary Diagrams   Diagnostic Diagram        Images were personally reviewed: the ramus intermedius has high-grade obstruction (culprit) as does the mid PDA. Proximal disease in the LAD is moderate to moderately severe (agree 70% range), proximal/ostial circumflex does not contain significant disease. There is diffuse disease and the targets are relatively small (images were made low magnification field). Await surgical opinion.  LVEF 45% LVEDP 36 mmHg    Patient Profile     71 y.o. male with PMH of HTN, and HLAnd no prior history of heart disease. Developed chest discomfort and upon reporting  to [facility] was noted to have elevated cardiac markers consistent with non-ST elevation MI. Coronary angiography demonstrated multivessel CAD with ostial involvement of LAD, ramus, and circumflex as well as high-grade obstruction in the mid PDA. CABG has been recommended.  Assessment & Plan    1. ACS/advanced CAD - coronary images personally reviewed. Targets are relatively small. 2. Hypokalemia - Will replete with oral potassium.  3. A/C systolic and diastolic HF, we'll use diuretic therapy and guideline recommended heart failure therapy as required.  Low-dose Aldactone started. Daily Bmet.  4. Hypertension - will be managed with antianginal and heart failure therapy.   Signed, Sinclair Grooms, MD  09/25/2016, 9:42 AM

## 2016-09-25 NOTE — Progress Notes (Signed)
  Echocardiogram 2D Echocardiogram has been performed.  Ernest Booker 09/25/2016, 2:39 PM

## 2016-09-25 NOTE — Progress Notes (Signed)
CARDIAC REHAB PHASE I   PRE:  Rate/Rhythm: 73 SR    BP: sitting 128/75    SaO2: 97 RA  MODE:  Ambulation: 272 ft   POST:  Rate/Rhythm: 90 SR    BP: sitting 129/81     SaO2: 96 RA  Pt eager to get OOB and walk. Slow pace, steady. Pt did become tired after 200 ft, stopped and rested. To recliner. Sts he is "ok", no CP. Discussed sternal precautions, IS, mobility and d/c planning with pt and family. Voiced understanding. I gave them OHS booklet, careguide, and video to watch. Pt and family thankful for information. Encouraged activity in room and light walking over weekend.  [place], ACSM 09/25/2016 10:23 AM

## 2016-09-25 NOTE — Progress Notes (Signed)
Pre-op Cardiac Surgery  Carotid Findings:  Findings suggest 1-39% internal carotid artery stenosis bilaterally. Vertebral arteries are patent with antegrade flow.  Upper Extremity Right Left  Brachial Pressures Triphasic 117-Triphasic  Radial Waveforms Triphasic Triphasic  Ulnar Waveforms Triphasic Triphasic  Palmar Arch (Allen's Test) Signal decreases <50% with radial compression, is unaffected with ulnar compression. Signal obliterates with radial compression, is unaffected with ulnar compression.   Lower  Extremity Right Left  Dorsalis Pedis Triphasic Triphasic  Posterior Tibial Triphasic Triphasic    Findings:   Bilateral pedal waveforms are within normal limits.  09/25/2016 12:05 PM Maudry Mayhew, BS, RVT, RDCS, RDMS

## 2016-09-26 ENCOUNTER — Inpatient Hospital Stay (HOSPITAL_COMMUNITY): Payer: BLUE CROSS/BLUE SHIELD

## 2016-09-26 LAB — BASIC METABOLIC PANEL
Anion gap: 7 (ref 5–15)
BUN: 11 mg/dL (ref 6–20)
CALCIUM: 8.4 mg/dL — AB (ref 8.9–10.3)
CHLORIDE: 108 mmol/L (ref 101–111)
CO2: 23 mmol/L (ref 22–32)
Creatinine, Ser: 1.25 mg/dL — ABNORMAL HIGH (ref 0.61–1.24)
GFR, EST NON AFRICAN AMERICAN: 57 mL/min — AB (ref >60)
Glucose, Bld: 119 mg/dL — ABNORMAL HIGH (ref 65–99)
Potassium: 3.6 mmol/L (ref 3.5–5.1)
SODIUM: 138 mmol/L (ref 135–145)

## 2016-09-26 LAB — CBC
HCT: 38.3 % — ABNORMAL LOW (ref 39.0–52.0)
HEMOGLOBIN: 13.1 g/dL (ref 13.0–17.0)
MCH: 30.5 pg (ref 26.0–34.0)
MCHC: 34.2 g/dL (ref 30.0–36.0)
MCV: 89.1 fL (ref 78.0–100.0)
PLATELETS: 157 10*3/uL (ref 150–400)
RBC: 4.3 MIL/uL (ref 4.22–5.81)
RDW: 14 % (ref 11.5–15.5)
WBC: 9 10*3/uL (ref 4.0–10.5)

## 2016-09-26 LAB — LIPID PANEL
Cholesterol: 116 mg/dL (ref 0–200)
HDL: 34 mg/dL — ABNORMAL LOW (ref >40)
LDL Cholesterol: 61 mg/dL (ref 0–99)
Total CHOL/HDL Ratio: 3.4 RATIO
Triglycerides: 103 mg/dL (ref <150)
VLDL: 21 mg/dL (ref 0–40)

## 2016-09-26 LAB — HEPARIN LEVEL (UNFRACTIONATED): HEPARIN UNFRACTIONATED: 0.47 [IU]/mL (ref 0.30–0.70)

## 2016-09-26 LAB — TSH: TSH: 2.023 u[IU]/mL (ref 0.350–4.500)

## 2016-09-26 NOTE — Progress Notes (Signed)
ANTICOAGULATION CONSULT NOTE - Follow Up Consult  Pharmacy Consult for Heparin Indication: CAD  Allergies  Allergen Reactions  . Benadryl [Diphenhydramine Hcl (Sleep)]     UNSPECIFIED REACTION   . Levofloxacin     UNSPECIFIED REACTION    Patient Measurements: Height: 5\' 6"  (167.6 cm) Weight: 203 lb 11.3 oz (92.4 kg) IBW/kg (Calculated) : 63.8 Heparin Dosing Weight:    Vital Signs: Temp: 98.2 F (36.8 C) (06/09 0825) Temp Source: Oral (06/09 0825) BP: 131/85 (06/09 0615) Pulse Rate: 65 (06/09 0615)  Labs:  Recent Labs  09/24/16 2225 09/25/16 0455 09/26/16 0232  HGB  --  13.0 13.1  HCT  --  38.8* 38.3*  PLT  --  162 157  HEPARINUNFRC 0.45 0.43 0.47  CREATININE  --  1.20 1.25*   Estimated Creatinine Clearance: 60.2 mL/min (A) (by C-G formula based on SCr of 1.25 mg/dL (H)).  Assessment: 71 yo male s/p cath on 6/7 on heparin gtt for planned CABG 6/11. CBC stable. No bleeding or infusion issues documented.   Heparin level: 0.47  Goal of Therapy:  Heparin level 0.3-0.7 units/ml Monitor platelets by anticoagulation protocol: Yes   Plan:  Continue heparin gtt at 1200 units/hr Daily heparin level/CBC Monitor for s/sx of bleeding  Georga Bora, PharmD Clinical Pharmacist 09/26/2016 11:47 AM

## 2016-09-26 NOTE — Progress Notes (Addendum)
Progress Note  Patient Name: Ernest Booker Date of Encounter: 09/26/2016  Primary Cardiologist: Adora Fridge  Subjective    Reports mild chest discomfort last night. Now resolved. No dyspnea.   Presented from the [facility] emergency room. Elevated markers and underwent coronary angiography yesterday. Coronary bypass grafting recommended by Dr.Berry.  Inpatient Medications    Scheduled Meds: . aspirin EC  81 mg Oral Daily  . atorvastatin  80 mg Oral q1800  . Chlorhexidine Gluconate Cloth  6 each Topical Daily  . metoprolol succinate  50 mg Oral Daily  . mupirocin ointment   Nasal BID  . pantoprazole  40 mg Oral BID  . sodium chloride flush  3 mL Intravenous Q12H  . spironolactone  25 mg Oral Daily   Continuous Infusions: . heparin 1,200 Units/hr (09/25/16 2119)   PRN Meds: acetaminophen, morphine injection, nitroGLYCERIN, ondansetron (ZOFRAN) IV, sodium chloride flush   Vital Signs    Vitals:   09/26/16 0545 09/26/16 0600 09/26/16 0615 09/26/16 0825  BP: 128/87 (!) 143/94 131/85   Pulse: 63 66 65   Resp: 19 (!) 23 (!) 22   Temp:    98.2 F (36.8 C)  TempSrc:    Oral  SpO2: 96% 99% 94%   Weight:      Height:        Intake/Output Summary (Last 24 hours) at 09/26/16 0842 Last data filed at 09/26/16 0700  Gross per 24 hour  Intake              684 ml  Output             1325 ml  Net             -641 ml   Filed Weights   09/24/16 1513  Weight: 203 lb 11.3 oz (92.4 kg)    Telemetry    Normal sinus rhythm without ectopy. - Personally Reviewed  ECG    Yesterday- NSR with low voltage. TWA c/w lateral ischemia.  - Personally Reviewed  Physical Exam   GEN: No acute distress.   Neck: No JVD Cardiac: RRR, no murmurs, rubs, or gallops. Right radial site without hematoma.  Respiratory: Clear to auscultation bilaterally. GI: Soft, nontender, non-distended  MS: No edema; No deformity. Neuro:  Nonfocal . Very hard of hearing Psych: Normal affect   Labs      Chemistry  Recent Labs Lab 09/25/16 0455 09/26/16 0232  NA 138 138  K 3.4* 3.6  CL 108 108  CO2 22 23  GLUCOSE 109* 119*  BUN 12 11  CREATININE 1.20 1.25*  CALCIUM 8.1* 8.4*  GFRNONAA >60 57*  GFRAA >60 >60  ANIONGAP 8 7     Hematology  Recent Labs Lab 09/25/16 0455 09/26/16 0232  WBC 9.3 9.0  RBC 4.35 4.30  HGB 13.0 13.1  HCT 38.8* 38.3*  MCV 89.2 89.1  MCH 29.9 30.5  MCHC 33.5 34.2  RDW 13.8 14.0  PLT 162 157    Cardiac EnzymesNo results for input(s): TROPONINI in the last 168 hours. No results for input(s): TROPIPOC in the last 168 hours.   BNPNo results for input(s): BNP, PROBNP in the last 168 hours.   DDimer No results for input(s): DDIMER in the last 168 hours.   Radiology    Dg Chest Port 1 View  Result Date: 09/26/2016 CLINICAL DATA:  71 year old male preoperative study. Planned CABG. N-STEMI. EXAM: PORTABLE CHEST 1 VIEW COMPARISON:  [facility] portable chest 09/23/2016 and earlier. FINDINGS:  Portable AP semi upright view at 0626 hours. Stable lung volumes. Allowing for portable technique the lungs are clear. Mediastinal contours are stable and within normal limits. Visualized tracheal air column is within normal limits. No acute osseous abnormality identified. IMPRESSION: No acute cardiopulmonary abnormality. Electronically Signed   By: Genevie Ann M.D.   On: 09/26/2016 08:09    Cardiac Studies   Coronary Diagrams   Diagnostic Diagram        Images were personally reviewed: the ramus intermedius has high-grade obstruction (culprit) as does the mid PDA. Proximal disease in the LAD is moderate to moderately severe (agree 70% range), proximal/ostial circumflex does not contain significant disease. There is diffuse disease and the targets are relatively small (images were made low magnification field). Await surgical opinion.  LVEF 45% LVEDP 36 mmHg    Patient Profile     71 y.o. male with PMH of HTN, and HLAnd no prior history of heart  disease. Developed chest discomfort and upon reporting to [facility] was noted to have elevated cardiac markers consistent with non-ST elevation MI. Coronary angiography demonstrated multivessel CAD with ostial involvement of LAD, ramus, and circumflex as well as high-grade obstruction in the mid PDA. CABG has been recommended.  Assessment & Plan    1. NSTEMI/ CAD - severe 3 vessel obstructive CAD. Seen by Dr. Prescott Gum. Plan CABG on Monday. Continue IV heparin. On beta blocker, statin, ASA.  2. Hypokalemia - replaced with oral potassium.  3. A/C systolic and diastolic HF, on aldactone. CXR clear. Lungs clear.  4. Hypertension - controlled.   Signed, Sherrod Toothman [place], MD  09/26/2016, 8:42 AM

## 2016-09-26 NOTE — Progress Notes (Signed)
CARDIAC REHAB PHASE I   PRE:  Rate/Rhythm: 64 SR  BP:  Supine:   Sitting: 121/83  Standing:    SaO2: 97% RA  MODE:  Ambulation: 780 ft   POST:  Rate/Rhythm: 66 SR  BP:  Supine:   Sitting: 124/89  Standing:    SaO2: 98% RA  1014-1040 Pt tolerated ambulation well, independent, denies CP, no c/o, VSS. To chair after walk with legs elevated.  Seward Carol, MS, ACSM CEp

## 2016-09-27 LAB — VAS US DOPPLER PRE CABG
LEFT ECA DIAS: -18 cm/s
LEFT VERTEBRAL DIAS: 13 cm/s
Left CCA dist dias: -26 cm/s
Left CCA dist sys: -107 cm/s
Left CCA prox dias: 22 cm/s
Left CCA prox sys: 119 cm/s
Left ICA dist dias: -27 cm/s
Left ICA dist sys: -75 cm/s
Left ICA prox dias: -20 cm/s
Left ICA prox sys: -55 cm/s
RIGHT ECA DIAS: -12 cm/s
RIGHT VERTEBRAL DIAS: 19 cm/s
Right CCA prox dias: 20 cm/s
Right CCA prox sys: 101 cm/s
Right cca dist sys: -93 cm/s

## 2016-09-27 LAB — CBC
HCT: 39 % (ref 39.0–52.0)
Hemoglobin: 13.4 g/dL (ref 13.0–17.0)
MCH: 30.5 pg (ref 26.0–34.0)
MCHC: 34.4 g/dL (ref 30.0–36.0)
MCV: 88.8 fL (ref 78.0–100.0)
PLATELETS: 172 10*3/uL (ref 150–400)
RBC: 4.39 MIL/uL (ref 4.22–5.81)
RDW: 13.9 % (ref 11.5–15.5)
WBC: 9.1 10*3/uL (ref 4.0–10.5)

## 2016-09-27 LAB — BASIC METABOLIC PANEL
Anion gap: 7 (ref 5–15)
BUN: 10 mg/dL (ref 6–20)
CALCIUM: 8.5 mg/dL — AB (ref 8.9–10.3)
CO2: 23 mmol/L (ref 22–32)
Chloride: 108 mmol/L (ref 101–111)
Creatinine, Ser: 1.29 mg/dL — ABNORMAL HIGH (ref 0.61–1.24)
GFR calc Af Amer: >60 mL/min (ref >60)
GFR calc non Af Amer: 55 mL/min — ABNORMAL LOW (ref >60)
GLUCOSE: 104 mg/dL — AB (ref 65–99)
Potassium: 3.5 mmol/L (ref 3.5–5.1)
Sodium: 138 mmol/L (ref 135–145)

## 2016-09-27 LAB — HEPARIN LEVEL (UNFRACTIONATED): Heparin Unfractionated: 0.44 IU/mL (ref 0.30–0.70)

## 2016-09-27 LAB — PREPARE RBC (CROSSMATCH)

## 2016-09-27 LAB — ABO/RH: ABO/RH(D): A POS

## 2016-09-27 MED ORDER — TRANEXAMIC ACID (OHS) PUMP PRIME SOLUTION
2.0000 mg/kg | INTRAVENOUS | Status: DC
  Filled 2016-09-27: qty 1.85

## 2016-09-27 MED ORDER — POTASSIUM CHLORIDE 2 MEQ/ML IV SOLN
80.0000 meq | INTRAVENOUS | Status: DC
  Filled 2016-09-27: qty 40

## 2016-09-27 MED ORDER — DEXTROSE 5 % IV SOLN
1.5000 g | INTRAVENOUS | Status: AC
  Administered 2016-09-28: 750 g via INTRAVENOUS
  Administered 2016-09-28: 1.5 g via INTRAVENOUS
  Filled 2016-09-27: qty 1.5

## 2016-09-27 MED ORDER — CHLORHEXIDINE GLUCONATE 0.12 % MT SOLN
15.0000 mL | Freq: Once | OROMUCOSAL | Status: AC
  Administered 2016-09-28: 15 mL via OROMUCOSAL
  Filled 2016-09-27: qty 15

## 2016-09-27 MED ORDER — DEXMEDETOMIDINE HCL IN NACL 400 MCG/100ML IV SOLN
0.1000 ug/kg/h | INTRAVENOUS | Status: DC
  Administered 2016-09-28: 0.7 ug/kg/h via INTRAVENOUS
  Filled 2016-09-27: qty 100

## 2016-09-27 MED ORDER — DIAZEPAM 2 MG PO TABS
2.0000 mg | ORAL_TABLET | Freq: Once | ORAL | Status: AC
  Administered 2016-09-28: 2 mg via ORAL
  Filled 2016-09-27: qty 1

## 2016-09-27 MED ORDER — SODIUM CHLORIDE 0.9 % IV SOLN
30.0000 ug/min | INTRAVENOUS | Status: DC
  Filled 2016-09-27: qty 2

## 2016-09-27 MED ORDER — VANCOMYCIN HCL 10 G IV SOLR
1500.0000 mg | INTRAVENOUS | Status: DC
  Filled 2016-09-27: qty 1500

## 2016-09-27 MED ORDER — SODIUM CHLORIDE 0.9 % IV SOLN
INTRAVENOUS | Status: DC
  Filled 2016-09-27: qty 1

## 2016-09-27 MED ORDER — BISACODYL 5 MG PO TBEC
5.0000 mg | DELAYED_RELEASE_TABLET | Freq: Once | ORAL | Status: AC
  Administered 2016-09-27: 5 mg via ORAL
  Filled 2016-09-27: qty 1

## 2016-09-27 MED ORDER — METOPROLOL TARTRATE 12.5 MG HALF TABLET
12.5000 mg | ORAL_TABLET | Freq: Once | ORAL | Status: AC
  Administered 2016-09-28: 12.5 mg via ORAL
  Filled 2016-09-27: qty 1

## 2016-09-27 MED ORDER — NITROGLYCERIN IN D5W 200-5 MCG/ML-% IV SOLN
2.0000 ug/min | INTRAVENOUS | Status: DC
  Filled 2016-09-27: qty 250

## 2016-09-27 MED ORDER — TRANEXAMIC ACID (OHS) BOLUS VIA INFUSION
15.0000 mg/kg | INTRAVENOUS | Status: AC
  Administered 2016-09-28: 1386 mg via INTRAVENOUS
  Filled 2016-09-27: qty 1386

## 2016-09-27 MED ORDER — EPINEPHRINE PF 1 MG/ML IJ SOLN
0.0000 ug/min | INTRAMUSCULAR | Status: DC
  Filled 2016-09-27: qty 4

## 2016-09-27 MED ORDER — DOPAMINE-DEXTROSE 3.2-5 MG/ML-% IV SOLN
0.0000 ug/kg/min | INTRAVENOUS | Status: DC
  Filled 2016-09-27: qty 250

## 2016-09-27 MED ORDER — SODIUM CHLORIDE 0.9 % IV SOLN
INTRAVENOUS | Status: DC
  Filled 2016-09-27: qty 30

## 2016-09-27 MED ORDER — PLASMA-LYTE 148 IV SOLN
INTRAVENOUS | Status: AC
  Administered 2016-09-28: 500 mL
  Filled 2016-09-27: qty 2.5

## 2016-09-27 MED ORDER — ALPRAZOLAM 0.25 MG PO TABS
0.2500 mg | ORAL_TABLET | Freq: Two times a day (BID) | ORAL | Status: DC | PRN
Start: 2016-09-27 — End: 2016-09-28

## 2016-09-27 MED ORDER — CHLORHEXIDINE GLUCONATE 4 % EX LIQD
60.0000 mL | Freq: Once | CUTANEOUS | Status: AC
  Administered 2016-09-28: 4 via TOPICAL

## 2016-09-27 MED ORDER — TEMAZEPAM 7.5 MG PO CAPS: 15.0000 mg | ORAL_CAPSULE | Freq: Once | ORAL | Status: DC | PRN

## 2016-09-27 MED ORDER — MAGNESIUM SULFATE 50 % IJ SOLN
40.0000 meq | INTRAMUSCULAR | Status: DC
Start: 2016-09-28 — End: 2016-09-28
  Filled 2016-09-27: qty 10

## 2016-09-27 MED ORDER — DEXTROSE 5 % IV SOLN
750.0000 mg | INTRAVENOUS | Status: DC
  Filled 2016-09-27: qty 750

## 2016-09-27 MED ORDER — TRANEXAMIC ACID 1000 MG/10ML IV SOLN
1.5000 mg/kg/h | INTRAVENOUS | Status: AC
  Administered 2016-09-28: 1.5 mg/kg/h via INTRAVENOUS
  Filled 2016-09-27: qty 25

## 2016-09-27 MED ORDER — CHLORHEXIDINE GLUCONATE 4 % EX LIQD
60.0000 mL | Freq: Once | CUTANEOUS | Status: AC
  Administered 2016-09-27: 4 via TOPICAL
  Filled 2016-09-27: qty 60

## 2016-09-27 NOTE — Progress Notes (Addendum)
ANTICOAGULATION CONSULT NOTE - Follow Up Consult  Pharmacy Consult for Heparin Indication: CAD  Allergies  Allergen Reactions  . Benadryl [Diphenhydramine Hcl (Sleep)]     UNSPECIFIED REACTION   . Levofloxacin     UNSPECIFIED REACTION    Patient Measurements: Height: 5\' 6"  (167.6 cm) Weight: 203 lb 11.3 oz (92.4 kg) IBW/kg (Calculated) : 63.8 Heparin Dosing Weight:    Vital Signs: Temp: 97.9 F (36.6 C) (06/10 0816) Temp Source: Oral (06/10 0816) BP: 144/90 (06/10 0816) Pulse Rate: 57 (06/10 0359)  Labs:  Recent Labs  09/25/16 0455 09/26/16 0232 09/27/16 0156  HGB 13.0 13.1 13.4  HCT 38.8* 38.3* 39.0  PLT 162 157 172  HEPARINUNFRC 0.43 0.47 0.44  CREATININE 1.20 1.25* 1.29*   Estimated Creatinine Clearance: 58.3 mL/min (A) (by C-G formula based on SCr of 1.29 mg/dL (H)).  Assessment: 71 yo male s/p cath on 6/7 on heparin gtt for planned CABG 6/11. CBC stable. No bleeding or infusion issues documented.   Heparin level: 0.44  Goal of Therapy:  Heparin level 0.3-0.7 units/ml Monitor platelets by anticoagulation protocol: Yes   Plan:  Continue heparin gtt at 1200 units/hr Daily heparin level/CBC Monitor for s/sx of bleeding  Georga Bora, PharmD Clinical Pharmacist 09/27/2016 10:59 AM

## 2016-09-27 NOTE — Progress Notes (Signed)
Progress Note  Patient Name: Ernest Booker Date of Encounter: 09/27/2016  Primary Cardiologist: Adora Fridge  Subjective    No chest pain yesterday. No dyspnea.   Presented from the [facility] emergency room. Elevated markers and underwent coronary angiography yesterday. Coronary bypass grafting recommended by Dr.Berry.  Inpatient Medications    Scheduled Meds: . aspirin EC  81 mg Oral Daily  . atorvastatin  80 mg Oral q1800  . bisacodyl  5 mg Oral Once  . chlorhexidine  60 mL Topical Once   And  . [START ON 09/28/2016] chlorhexidine  60 mL Topical Once  . [START ON 09/28/2016] chlorhexidine  15 mL Mouth/Throat Once  . Chlorhexidine Gluconate Cloth  6 each Topical Daily  . [START ON 09/28/2016] diazepam  2 mg Oral Once  . metoprolol succinate  50 mg Oral Daily  . [START ON 09/28/2016] metoprolol tartrate  12.5 mg Oral Once  . mupirocin ointment   Nasal BID  . pantoprazole  40 mg Oral BID  . sodium chloride flush  3 mL Intravenous Q12H  . spironolactone  25 mg Oral Daily   Continuous Infusions: . heparin 1,200 Units/hr (09/25/16 2119)   PRN Meds: acetaminophen, ALPRAZolam, morphine injection, nitroGLYCERIN, ondansetron (ZOFRAN) IV, sodium chloride flush, temazepam   Vital Signs    Vitals:   09/26/16 2045 09/26/16 2350 09/27/16 0359 09/27/16 0816  BP: 126/76 110/69 117/83 (!) 144/90  Pulse: (!) 57 (!) 54 (!) 57   Resp: 20 20 18 19   Temp: 98.2 F (36.8 C) 98.4 F (36.9 C) 97.8 F (36.6 C) 97.9 F (36.6 C)  TempSrc: Oral Oral Oral Oral  SpO2: 98% 96% 95% 96%  Weight:      Height:        Intake/Output Summary (Last 24 hours) at 09/27/16 0830 Last data filed at 09/27/16 9450  Gross per 24 hour  Intake            278.6 ml  Output              800 ml  Net           -521.4 ml   Filed Weights   09/24/16 1513  Weight: 203 lb 11.3 oz (92.4 kg)    Telemetry    Normal sinus rhythm without ectopy. - Personally Reviewed  ECG    None today  - Personally  Reviewed  Physical Exam   GEN: No acute distress.   Neck: No JVD Cardiac: RRR, no murmurs, rubs, or gallops. Right radial site without hematoma.  Respiratory: Clear to auscultation bilaterally. GI: Soft, nontender, non-distended  MS: No edema; No deformity. Neuro:  Nonfocal . Very hard of hearing Psych: Normal affect   Labs    Chemistry  Recent Labs Lab 09/25/16 0455 09/26/16 0232 09/27/16 0156  NA 138 138 138  K 3.4* 3.6 3.5  CL 108 108 108  CO2 22 23 23   GLUCOSE 109* 119* 104*  BUN 12 11 10   CREATININE 1.20 1.25* 1.29*  CALCIUM 8.1* 8.4* 8.5*  GFRNONAA >60 57* 55*  GFRAA >60 >60 >60  ANIONGAP 8 7 7      Hematology  Recent Labs Lab 09/25/16 0455 09/26/16 0232 09/27/16 0156  WBC 9.3 9.0 9.1  RBC 4.35 4.30 4.39  HGB 13.0 13.1 13.4  HCT 38.8* 38.3* 39.0  MCV 89.2 89.1 88.8  MCH 29.9 30.5 30.5  MCHC 33.5 34.2 34.4  RDW 13.8 14.0 13.9  PLT 162 157 172    Cardiac EnzymesNo  results for input(s): TROPONINI in the last 168 hours. No results for input(s): TROPIPOC in the last 168 hours.   BNPNo results for input(s): BNP, PROBNP in the last 168 hours.   DDimer No results for input(s): DDIMER in the last 168 hours.   Radiology    Dg Chest Port 1 View  Result Date: 09/26/2016 CLINICAL DATA:  71 year old male preoperative study. Planned CABG. N-STEMI. EXAM: PORTABLE CHEST 1 VIEW COMPARISON:  [facility] portable chest 09/23/2016 and earlier. FINDINGS: Portable AP semi upright view at 0626 hours. Stable lung volumes. Allowing for portable technique the lungs are clear. Mediastinal contours are stable and within normal limits. Visualized tracheal air column is within normal limits. No acute osseous abnormality identified. IMPRESSION: No acute cardiopulmonary abnormality. Electronically Signed   By: Genevie Ann M.D.   On: 09/26/2016 08:09    Cardiac Studies   Coronary Diagrams   Diagnostic Diagram        Images were personally reviewed: the ramus intermedius  has high-grade obstruction (culprit) as does the mid PDA. Proximal disease in the LAD is moderate to moderately severe (agree 70% range), proximal/ostial circumflex does not contain significant disease. There is diffuse disease and the targets are relatively small (images were made low magnification field). Await surgical opinion.  LVEF 45% LVEDP 36 mmHg    Patient Profile     71 y.o. male with PMH of HTN, and HLAnd no prior history of heart disease. Developed chest discomfort and upon reporting to [facility] was noted to have elevated cardiac markers consistent with non-ST elevation MI. Coronary angiography demonstrated multivessel CAD with ostial involvement of LAD, ramus, and circumflex as well as high-grade obstruction in the mid PDA. CABG has been recommended.  Assessment & Plan    1. NSTEMI/ CAD - severe 3 vessel obstructive CAD. Seen by Dr. Prescott Gum. Plan CABG on Monday. Continue IV heparin. On beta blocker, statin, ASA.  2. Hypokalemia - replaced with oral potassium.  3. A/C systolic and diastolic HF, CXR clear. Lungs clear. Will hold aldactone for now given CKD and plans for CABG tomorrow.  4. Hypertension - controlled.   Signed, Dameir Gentzler [place], MD  09/27/2016, 8:30 AM

## 2016-09-28 ENCOUNTER — Inpatient Hospital Stay (HOSPITAL_COMMUNITY): Payer: BLUE CROSS/BLUE SHIELD

## 2016-09-28 ENCOUNTER — Inpatient Hospital Stay (HOSPITAL_COMMUNITY): Payer: BLUE CROSS/BLUE SHIELD | Admitting: Certified Registered Nurse Anesthetist

## 2016-09-28 ENCOUNTER — Inpatient Hospital Stay (HOSPITAL_COMMUNITY)
Admission: AD | Disposition: A | Discharge status: Home Health | Payer: Self-pay | Source: Other Acute Inpatient Hospital | Attending: Cardiothoracic Surgery

## 2016-09-28 DIAGNOSIS — I2510 Atherosclerotic heart disease of native coronary artery without angina pectoris: Secondary | ICD-10-CM | POA: Diagnosis present

## 2016-09-28 DIAGNOSIS — I25110 Atherosclerotic heart disease of native coronary artery with unstable angina pectoris: Secondary | ICD-10-CM

## 2016-09-28 HISTORY — DX: Atherosclerotic heart disease of native coronary artery without angina pectoris: I25.10

## 2016-09-28 HISTORY — PX: TEE WITHOUT CARDIOVERSION: SHX5443

## 2016-09-28 HISTORY — PX: CORONARY ARTERY BYPASS GRAFT: SHX141

## 2016-09-28 LAB — POCT I-STAT, CHEM 8
BUN: 12 mg/dL (ref 6–20)
BUN: 12 mg/dL (ref 6–20)
BUN: 12 mg/dL (ref 6–20)
BUN: 11 mg/dL (ref 6–20)
BUN: 10 mg/dL (ref 6–20)
BUN: 10 mg/dL (ref 6–20)
BUN: 10 mg/dL (ref 6–20)
CALCIUM ION: 1.09 mmol/L — AB (ref 1.15–1.40)
CALCIUM ION: 1.07 mmol/L — AB (ref 1.15–1.40)
CALCIUM ION: 1.04 mmol/L — AB (ref 1.15–1.40)
CALCIUM ION: 1.16 mmol/L (ref 1.15–1.40)
CHLORIDE: 100 mmol/L — AB (ref 101–111)
CHLORIDE: 104 mmol/L (ref 101–111)
CREATININE: 1.1 mg/dL (ref 0.61–1.24)
CREATININE: 1.1 mg/dL (ref 0.61–1.24)
CREATININE: 0.9 mg/dL (ref 0.61–1.24)
CREATININE: 1.1 mg/dL (ref 0.61–1.24)
Calcium, Ion: 1.23 mmol/L (ref 1.15–1.40)
Calcium, Ion: 1.21 mmol/L (ref 1.15–1.40)
Calcium, Ion: 1.04 mmol/L — ABNORMAL LOW (ref 1.15–1.40)
Chloride: 105 mmol/L (ref 101–111)
Chloride: 104 mmol/L (ref 101–111)
Chloride: 101 mmol/L (ref 101–111)
Chloride: 101 mmol/L (ref 101–111)
Chloride: 101 mmol/L (ref 101–111)
Creatinine, Ser: 0.9 mg/dL (ref 0.61–1.24)
Creatinine, Ser: 1 mg/dL (ref 0.61–1.24)
Creatinine, Ser: 0.9 mg/dL (ref 0.61–1.24)
GLUCOSE: 102 mg/dL — AB (ref 65–99)
GLUCOSE: 108 mg/dL — AB (ref 65–99)
GLUCOSE: 130 mg/dL — AB (ref 65–99)
GLUCOSE: 175 mg/dL — AB (ref 65–99)
Glucose, Bld: 100 mg/dL — ABNORMAL HIGH (ref 65–99)
Glucose, Bld: 132 mg/dL — ABNORMAL HIGH (ref 65–99)
Glucose, Bld: 118 mg/dL — ABNORMAL HIGH (ref 65–99)
HCT: 30 % — ABNORMAL LOW (ref 39.0–52.0)
HCT: 33 % — ABNORMAL LOW (ref 39.0–52.0)
HCT: 29 % — ABNORMAL LOW (ref 39.0–52.0)
HCT: 30 % — ABNORMAL LOW (ref 39.0–52.0)
HCT: 29 % — ABNORMAL LOW (ref 39.0–52.0)
HCT: 26 % — ABNORMAL LOW (ref 39.0–52.0)
HEMATOCRIT: 38 % — AB (ref 39.0–52.0)
HEMOGLOBIN: 11.2 g/dL — AB (ref 13.0–17.0)
HEMOGLOBIN: 9.9 g/dL — AB (ref 13.0–17.0)
HEMOGLOBIN: 9.9 g/dL — AB (ref 13.0–17.0)
Hemoglobin: 12.9 g/dL — ABNORMAL LOW (ref 13.0–17.0)
Hemoglobin: 10.2 g/dL — ABNORMAL LOW (ref 13.0–17.0)
Hemoglobin: 10.2 g/dL — ABNORMAL LOW (ref 13.0–17.0)
Hemoglobin: 8.8 g/dL — ABNORMAL LOW (ref 13.0–17.0)
POTASSIUM: 4.1 mmol/L (ref 3.5–5.1)
Potassium: 3.7 mmol/L (ref 3.5–5.1)
Potassium: 4.1 mmol/L (ref 3.5–5.1)
Potassium: 4.7 mmol/L (ref 3.5–5.1)
Potassium: 4.6 mmol/L (ref 3.5–5.1)
Potassium: 3.9 mmol/L (ref 3.5–5.1)
Potassium: 4.3 mmol/L (ref 3.5–5.1)
SODIUM: 138 mmol/L (ref 135–145)
Sodium: 140 mmol/L (ref 135–145)
Sodium: 139 mmol/L (ref 135–145)
Sodium: 141 mmol/L (ref 135–145)
Sodium: 135 mmol/L (ref 135–145)
Sodium: 136 mmol/L (ref 135–145)
Sodium: 137 mmol/L (ref 135–145)
TCO2: 27 mmol/L (ref 0–100)
TCO2: 24 mmol/L (ref 0–100)
TCO2: 28 mmol/L (ref 0–100)
TCO2: 27 mmol/L (ref 0–100)
TCO2: 27 mmol/L (ref 0–100)
TCO2: 23 mmol/L (ref 0–100)
TCO2: 22 mmol/L (ref 0–100)

## 2016-09-28 LAB — POCT I-STAT 4, (NA,K, GLUC, HGB,HCT)
Glucose, Bld: 122 mg/dL — ABNORMAL HIGH (ref 65–99)
HCT: 28 % — ABNORMAL LOW (ref 39.0–52.0)
Hemoglobin: 9.5 g/dL — ABNORMAL LOW (ref 13.0–17.0)
POTASSIUM: 3.8 mmol/L (ref 3.5–5.1)
Sodium: 139 mmol/L (ref 135–145)

## 2016-09-28 LAB — BASIC METABOLIC PANEL
ANION GAP: 6 (ref 5–15)
BUN: 12 mg/dL (ref 6–20)
CO2: 23 mmol/L (ref 22–32)
Calcium: 8.7 mg/dL — ABNORMAL LOW (ref 8.9–10.3)
Chloride: 108 mmol/L (ref 101–111)
Creatinine, Ser: 1.24 mg/dL (ref 0.61–1.24)
GFR calc non Af Amer: 58 mL/min — ABNORMAL LOW (ref >60)
Glucose, Bld: 104 mg/dL — ABNORMAL HIGH (ref 65–99)
POTASSIUM: 3.6 mmol/L (ref 3.5–5.1)
SODIUM: 137 mmol/L (ref 135–145)

## 2016-09-28 LAB — POCT I-STAT 3, ART BLOOD GAS (G3+)
ACID-BASE DEFICIT: 1 mmol/L (ref 0.0–2.0)
ACID-BASE DEFICIT: 6 mmol/L — AB (ref 0.0–2.0)
ACID-BASE DEFICIT: 4 mmol/L — AB (ref 0.0–2.0)
ACID-BASE EXCESS: 4 mmol/L — AB (ref 0.0–2.0)
Acid-base deficit: 3 mmol/L — ABNORMAL HIGH (ref 0.0–2.0)
BICARBONATE: 19.5 mmol/L — AB (ref 20.0–28.0)
Bicarbonate: 28.7 mmol/L — ABNORMAL HIGH (ref 20.0–28.0)
Bicarbonate: 22.8 mmol/L (ref 20.0–28.0)
Bicarbonate: 22 mmol/L (ref 20.0–28.0)
Bicarbonate: 21.1 mmol/L (ref 20.0–28.0)
O2 SAT: 100 %
O2 SAT: 97 %
O2 SAT: 93 %
O2 Saturation: 99 %
O2 Saturation: 97 %
PCO2 ART: 34.7 mmHg (ref 32.0–48.0)
PCO2 ART: 34.8 mmHg (ref 32.0–48.0)
PH ART: 7.424 (ref 7.350–7.450)
PO2 ART: 159 mmHg — AB (ref 83.0–108.0)
Patient temperature: 35.8
TCO2: 30 mmol/L (ref 0–100)
TCO2: 24 mmol/L (ref 0–100)
TCO2: 23 mmol/L (ref 0–100)
TCO2: 21 mmol/L (ref 0–100)
TCO2: 22 mmol/L (ref 0–100)
pCO2 arterial: 43.9 mmHg (ref 32.0–48.0)
pCO2 arterial: 35.1 mmHg (ref 32.0–48.0)
pCO2 arterial: 39.6 mmHg (ref 32.0–48.0)
pH, Arterial: 7.426 (ref 7.350–7.450)
pH, Arterial: 7.404 (ref 7.350–7.450)
pH, Arterial: 7.351 (ref 7.350–7.450)
pH, Arterial: 7.336 — ABNORMAL LOW (ref 7.350–7.450)
pO2, Arterial: 448 mmHg — ABNORMAL HIGH (ref 83.0–108.0)
pO2, Arterial: 81 mmHg — ABNORMAL LOW (ref 83.0–108.0)
pO2, Arterial: 95 mmHg (ref 83.0–108.0)
pO2, Arterial: 71 mmHg — ABNORMAL LOW (ref 83.0–108.0)

## 2016-09-28 LAB — CBC
HCT: 42.1 % (ref 39.0–52.0)
HCT: 28.7 % — ABNORMAL LOW (ref 39.0–52.0)
HEMATOCRIT: 32.1 % — AB (ref 39.0–52.0)
HEMOGLOBIN: 14.4 g/dL (ref 13.0–17.0)
HEMOGLOBIN: 10.8 g/dL — AB (ref 13.0–17.0)
Hemoglobin: 9.7 g/dL — ABNORMAL LOW (ref 13.0–17.0)
MCH: 30.8 pg (ref 26.0–34.0)
MCH: 29.5 pg (ref 26.0–34.0)
MCH: 30 pg (ref 26.0–34.0)
MCHC: 34.2 g/dL (ref 30.0–36.0)
MCHC: 33.6 g/dL (ref 30.0–36.0)
MCHC: 33.8 g/dL (ref 30.0–36.0)
MCV: 90.1 fL (ref 78.0–100.0)
MCV: 87.7 fL (ref 78.0–100.0)
MCV: 88.9 fL (ref 78.0–100.0)
PLATELETS: 191 10*3/uL (ref 150–400)
PLATELETS: 121 10*3/uL — AB (ref 150–400)
Platelets: 126 10*3/uL — ABNORMAL LOW (ref 150–400)
RBC: 4.67 MIL/uL (ref 4.22–5.81)
RBC: 3.66 MIL/uL — AB (ref 4.22–5.81)
RBC: 3.23 MIL/uL — ABNORMAL LOW (ref 4.22–5.81)
RDW: 14.5 % (ref 11.5–15.5)
RDW: 13.6 % (ref 11.5–15.5)
RDW: 13.9 % (ref 11.5–15.5)
WBC: 9.2 10*3/uL (ref 4.0–10.5)
WBC: 13.1 10*3/uL — ABNORMAL HIGH (ref 4.0–10.5)
WBC: 11.5 10*3/uL — ABNORMAL HIGH (ref 4.0–10.5)

## 2016-09-28 LAB — GLUCOSE, CAPILLARY
GLUCOSE-CAPILLARY: 115 mg/dL — AB (ref 65–99)
Glucose-Capillary: 170 mg/dL — ABNORMAL HIGH (ref 65–99)
Glucose-Capillary: 132 mg/dL — ABNORMAL HIGH (ref 65–99)

## 2016-09-28 LAB — PROTIME-INR
INR: 1.41
Prothrombin Time: 17.3 seconds — ABNORMAL HIGH (ref 11.4–15.2)

## 2016-09-28 LAB — CREATININE, SERUM
Creatinine, Ser: 1.24 mg/dL (ref 0.61–1.24)
GFR calc Af Amer: >60 mL/min (ref >60)
GFR calc non Af Amer: 58 mL/min — ABNORMAL LOW (ref >60)

## 2016-09-28 LAB — HEMOGLOBIN AND HEMATOCRIT, BLOOD
HCT: 29.7 % — ABNORMAL LOW (ref 39.0–52.0)
Hemoglobin: 10.1 g/dL — ABNORMAL LOW (ref 13.0–17.0)

## 2016-09-28 LAB — APTT: APTT: 29 s (ref 24–36)

## 2016-09-28 LAB — PLATELET COUNT: Platelets: 122 10*3/uL — ABNORMAL LOW (ref 150–400)

## 2016-09-28 LAB — MAGNESIUM: Magnesium: 2.6 mg/dL — ABNORMAL HIGH (ref 1.7–2.4)

## 2016-09-28 SURGERY — CORONARY ARTERY BYPASS GRAFTING (CABG)
Anesthesia: General | Site: Chest

## 2016-09-28 MED ORDER — METOPROLOL TARTRATE 5 MG/5ML IV SOLN
2.5000 mg | INTRAVENOUS | Status: DC | PRN
  Administered 2016-10-03: 5 mg via INTRAVENOUS
  Filled 2016-09-28 (×2): qty 5

## 2016-09-28 MED ORDER — SODIUM CHLORIDE 0.9% FLUSH: 3.0000 mL | INTRAVENOUS | Status: DC | PRN

## 2016-09-28 MED ORDER — MILRINONE LACTATE IN DEXTROSE 20-5 MG/100ML-% IV SOLN
0.1250 ug/kg/min | INTRAVENOUS | Status: AC
  Administered 2016-09-29: 0.125 ug/kg/min via INTRAVENOUS
  Filled 2016-09-28 (×2): qty 100

## 2016-09-28 MED ORDER — AMIODARONE LOAD VIA INFUSION
150.0000 mg | Freq: Once | INTRAVENOUS | Status: AC
  Administered 2016-09-28: 150 mg via INTRAVENOUS
  Filled 2016-09-28: qty 83.34

## 2016-09-28 MED ORDER — LACTATED RINGERS IV SOLN
INTRAVENOUS | Status: DC | PRN
  Administered 2016-09-28: 07:00:00 via INTRAVENOUS

## 2016-09-28 MED ORDER — SODIUM CHLORIDE 0.9% FLUSH: 10.0000 mL | INTRAVENOUS | Status: DC | PRN

## 2016-09-28 MED ORDER — ROCURONIUM BROMIDE 100 MG/10ML IV SOLN
INTRAVENOUS | Status: DC | PRN
  Administered 2016-09-28: 30 mg via INTRAVENOUS
  Administered 2016-09-28 (×3): 50 mg via INTRAVENOUS

## 2016-09-28 MED ORDER — BISACODYL 10 MG RE SUPP: 10.0000 mg | Freq: Every day | RECTAL | Status: DC

## 2016-09-28 MED ORDER — SODIUM CHLORIDE 0.45 % IV SOLN
INTRAVENOUS | Status: DC | PRN
  Administered 2016-09-28: 15:00:00 via INTRAVENOUS

## 2016-09-28 MED ORDER — MORPHINE SULFATE (PF) 4 MG/ML IV SOLN
1.0000 mg | INTRAVENOUS | Status: DC | PRN
  Administered 2016-09-28: 4 mg via INTRAVENOUS

## 2016-09-28 MED ORDER — ALBUMIN HUMAN 5 % IV SOLN
INTRAVENOUS | Status: DC | PRN
  Administered 2016-09-28: 14:00:00 via INTRAVENOUS

## 2016-09-28 MED ORDER — ROCURONIUM BROMIDE 10 MG/ML (PF) SYRINGE
PREFILLED_SYRINGE | INTRAVENOUS | Status: AC
  Filled 2016-09-28: qty 5

## 2016-09-28 MED ORDER — HEMOSTATIC AGENTS (NO CHARGE) OPTIME
TOPICAL | Status: DC | PRN
  Administered 2016-09-28 (×2): 1 via TOPICAL
  Administered 2016-09-28: 2 via TOPICAL

## 2016-09-28 MED ORDER — EPHEDRINE SULFATE 50 MG/ML IJ SOLN
INTRAMUSCULAR | Status: DC | PRN
  Administered 2016-09-28: 10 mg via INTRAVENOUS

## 2016-09-28 MED ORDER — TRAMADOL HCL 50 MG PO TABS
50.0000 mg | ORAL_TABLET | ORAL | Status: DC | PRN
  Administered 2016-10-04 – 2016-10-09 (×3): 50 mg via ORAL
  Filled 2016-09-28 (×3): qty 1

## 2016-09-28 MED ORDER — MILRINONE LACTATE IN DEXTROSE 20-5 MG/100ML-% IV SOLN
0.1250 ug/kg/min | Freq: Once | INTRAVENOUS | Status: AC
  Administered 2016-09-28: 0.25 ug/kg/min via INTRAVENOUS
  Filled 2016-09-28: qty 100

## 2016-09-28 MED ORDER — LACTATED RINGERS IV SOLN: INTRAVENOUS | Status: DC

## 2016-09-28 MED ORDER — POTASSIUM CHLORIDE 10 MEQ/50ML IV SOLN
10.0000 meq | INTRAVENOUS | Status: AC
Start: 2016-09-28 — End: 2016-09-28
  Administered 2016-09-28 (×3): 10 meq via INTRAVENOUS
  Filled 2016-09-28 (×3): qty 50

## 2016-09-28 MED ORDER — INSULIN REGULAR HUMAN 100 UNIT/ML IJ SOLN
INTRAMUSCULAR | Status: DC
  Filled 2016-09-28: qty 1

## 2016-09-28 MED ORDER — PROTAMINE SULFATE 10 MG/ML IV SOLN
INTRAVENOUS | Status: DC | PRN
  Administered 2016-09-28: 350 mg via INTRAVENOUS

## 2016-09-28 MED ORDER — MIDAZOLAM HCL 2 MG/2ML IJ SOLN: 2.0000 mg | INTRAMUSCULAR | Status: DC | PRN

## 2016-09-28 MED ORDER — DEXTROSE 5 % IV SOLN
1.5000 g | Freq: Two times a day (BID) | INTRAVENOUS | Status: AC
  Administered 2016-09-28 – 2016-09-30 (×4): 1.5 g via INTRAVENOUS
  Filled 2016-09-28 (×4): qty 1.5

## 2016-09-28 MED ORDER — CALCIUM CHLORIDE 10 % IV SOLN
INTRAVENOUS | Status: AC
  Filled 2016-09-28: qty 10

## 2016-09-28 MED ORDER — INSULIN ASPART 100 UNIT/ML ~~LOC~~ SOLN
0.0000 [IU] | SUBCUTANEOUS | Status: DC
  Administered 2016-09-28: 2 [IU] via SUBCUTANEOUS
  Administered 2016-09-28: 4 [IU] via SUBCUTANEOUS
  Administered 2016-09-29 – 2016-10-01 (×12): 2 [IU] via SUBCUTANEOUS
  Administered 2016-10-01 (×2): 4 [IU] via SUBCUTANEOUS
  Administered 2016-10-01 – 2016-10-03 (×7): 2 [IU] via SUBCUTANEOUS

## 2016-09-28 MED ORDER — PHENYLEPHRINE HCL 10 MG/ML IJ SOLN
INTRAVENOUS | Status: DC | PRN
  Administered 2016-09-28: 30 ug/min via INTRAVENOUS

## 2016-09-28 MED ORDER — PROPOFOL 10 MG/ML IV BOLUS
INTRAVENOUS | Status: AC
  Filled 2016-09-28: qty 20

## 2016-09-28 MED ORDER — HEPARIN SODIUM (PORCINE) 1000 UNIT/ML IJ SOLN
INTRAMUSCULAR | Status: AC
  Filled 2016-09-28: qty 1

## 2016-09-28 MED ORDER — MIDAZOLAM HCL 5 MG/5ML IJ SOLN
INTRAMUSCULAR | Status: DC | PRN
  Administered 2016-09-28 (×2): 2 mg via INTRAVENOUS
  Administered 2016-09-28: 1 mg via INTRAVENOUS
  Administered 2016-09-28 (×2): 2 mg via INTRAVENOUS
  Administered 2016-09-28: 1 mg via INTRAVENOUS

## 2016-09-28 MED ORDER — VANCOMYCIN HCL IN DEXTROSE 1-5 GM/200ML-% IV SOLN
1000.0000 mg | Freq: Two times a day (BID) | INTRAVENOUS | Status: AC
  Administered 2016-09-28 – 2016-09-29 (×2): 1000 mg via INTRAVENOUS
  Filled 2016-09-28 (×2): qty 200

## 2016-09-28 MED ORDER — PROTAMINE SULFATE 10 MG/ML IV SOLN
INTRAVENOUS | Status: AC
  Filled 2016-09-28: qty 10

## 2016-09-28 MED ORDER — PHENYLEPHRINE HCL 10 MG/ML IJ SOLN
INTRAVENOUS | Status: DC | PRN
  Administered 2016-09-28: 20 ug/min via INTRAVENOUS

## 2016-09-28 MED ORDER — ONDANSETRON HCL 4 MG/2ML IJ SOLN
4.0000 mg | Freq: Four times a day (QID) | INTRAMUSCULAR | Status: DC | PRN
Start: 2016-09-28 — End: 2016-10-10
  Administered 2016-09-30 – 2016-10-07 (×4): 4 mg via INTRAVENOUS
  Filled 2016-09-28 (×4): qty 2

## 2016-09-28 MED ORDER — DEXMEDETOMIDINE HCL IN NACL 400 MCG/100ML IV SOLN
INTRAVENOUS | Status: DC | PRN
  Administered 2016-09-28: .3 ug/kg/h via INTRAVENOUS

## 2016-09-28 MED ORDER — SODIUM CHLORIDE 0.9 % IV SOLN
INTRAVENOUS | Status: DC
  Administered 2016-09-28: 15:00:00 via INTRAVENOUS

## 2016-09-28 MED ORDER — FENTANYL CITRATE (PF) 250 MCG/5ML IJ SOLN
INTRAMUSCULAR | Status: AC
  Filled 2016-09-28: qty 25

## 2016-09-28 MED ORDER — LIDOCAINE 2% (20 MG/ML) 5 ML SYRINGE
INTRAMUSCULAR | Status: AC
  Filled 2016-09-28: qty 5

## 2016-09-28 MED ORDER — ACETAMINOPHEN 160 MG/5ML PO SOLN: 650.0000 mg | Freq: Once | ORAL | Status: AC

## 2016-09-28 MED ORDER — BISACODYL 5 MG PO TBEC
10.0000 mg | DELAYED_RELEASE_TABLET | Freq: Every day | ORAL | Status: DC
  Administered 2016-09-29 – 2016-10-02 (×4): 10 mg via ORAL
  Filled 2016-09-28 (×5): qty 2

## 2016-09-28 MED ORDER — FENTANYL CITRATE (PF) 100 MCG/2ML IJ SOLN
INTRAMUSCULAR | Status: DC | PRN
  Administered 2016-09-28: 200 ug via INTRAVENOUS
  Administered 2016-09-28 (×4): 100 ug via INTRAVENOUS
  Administered 2016-09-28 (×2): 50 ug via INTRAVENOUS
  Administered 2016-09-28: 150 ug via INTRAVENOUS
  Administered 2016-09-28: 100 ug via INTRAVENOUS
  Administered 2016-09-28: 250 ug via INTRAVENOUS
  Administered 2016-09-28: 500 ug via INTRAVENOUS
  Administered 2016-09-28: 50 ug via INTRAVENOUS

## 2016-09-28 MED ORDER — CHLORHEXIDINE GLUCONATE 0.12% ORAL RINSE (MEDLINE KIT)
15.0000 mL | Freq: Two times a day (BID) | OROMUCOSAL | Status: DC
  Administered 2016-09-28 – 2016-09-29 (×3): 15 mL via OROMUCOSAL

## 2016-09-28 MED ORDER — SODIUM CHLORIDE 0.9 % IV SOLN
20.0000 ug | INTRAVENOUS | Status: AC
  Administered 2016-09-28: 20 ug via INTRAVENOUS
  Filled 2016-09-28: qty 5

## 2016-09-28 MED ORDER — GELATIN ABSORBABLE MT POWD
OROMUCOSAL | Status: DC | PRN
  Administered 2016-09-28 (×3): 4 mL via TOPICAL

## 2016-09-28 MED ORDER — CHLORHEXIDINE GLUCONATE CLOTH 2 % EX PADS
6.0000 | MEDICATED_PAD | Freq: Every day | CUTANEOUS | Status: DC
  Administered 2016-09-28 – 2016-09-30 (×3): 6 via TOPICAL

## 2016-09-28 MED ORDER — SODIUM CHLORIDE 0.9% FLUSH
10.0000 mL | Freq: Two times a day (BID) | INTRAVENOUS | Status: DC
  Administered 2016-09-28 – 2016-09-30 (×3): 10 mL

## 2016-09-28 MED ORDER — SODIUM CHLORIDE 0.9 % IV SOLN
INTRAVENOUS | Status: DC | PRN
  Administered 2016-09-28: 1500 mg via INTRAVENOUS

## 2016-09-28 MED ORDER — FAMOTIDINE IN NACL 20-0.9 MG/50ML-% IV SOLN
20.0000 mg | Freq: Two times a day (BID) | INTRAVENOUS | Status: AC
  Administered 2016-09-28 – 2016-09-29 (×2): 20 mg via INTRAVENOUS
  Filled 2016-09-28: qty 50

## 2016-09-28 MED ORDER — NITROGLYCERIN IN D5W 200-5 MCG/ML-% IV SOLN: 0.0000 ug/min | INTRAVENOUS | Status: DC

## 2016-09-28 MED ORDER — HEPARIN SODIUM (PORCINE) 1000 UNIT/ML IJ SOLN
INTRAMUSCULAR | Status: DC | PRN
  Administered 2016-09-28: 37000 [IU] via INTRAVENOUS
  Administered 2016-09-28: 3000 [IU] via INTRAVENOUS

## 2016-09-28 MED ORDER — SODIUM CHLORIDE 0.9 % IV SOLN
0.0000 ug/min | INTRAVENOUS | Status: DC
  Administered 2016-09-28: 50 ug/min via INTRAVENOUS
  Administered 2016-09-29: 60 ug/min via INTRAVENOUS
  Administered 2016-09-29: 35 ug/min via INTRAVENOUS
  Filled 2016-09-28 (×4): qty 2

## 2016-09-28 MED ORDER — ORAL CARE MOUTH RINSE
15.0000 mL | Freq: Four times a day (QID) | OROMUCOSAL | Status: DC
  Administered 2016-09-28 – 2016-09-29 (×4): 15 mL via OROMUCOSAL

## 2016-09-28 MED ORDER — MORPHINE SULFATE (PF) 2 MG/ML IV SOLN: 1.0000 mg | INTRAVENOUS | Status: DC | PRN

## 2016-09-28 MED ORDER — LACTATED RINGERS IV SOLN
INTRAVENOUS | Status: DC | PRN
Start: 2016-09-28 — End: 2016-09-28
  Administered 2016-09-28: 07:00:00 via INTRAVENOUS

## 2016-09-28 MED ORDER — SUCCINYLCHOLINE CHLORIDE 200 MG/10ML IV SOSY
PREFILLED_SYRINGE | INTRAVENOUS | Status: AC
  Filled 2016-09-28: qty 10

## 2016-09-28 MED ORDER — PANTOPRAZOLE SODIUM 40 MG PO TBEC
40.0000 mg | DELAYED_RELEASE_TABLET | Freq: Every day | ORAL | Status: DC
  Administered 2016-09-30 – 2016-10-01 (×2): 40 mg via ORAL
  Filled 2016-09-28 (×2): qty 1

## 2016-09-28 MED ORDER — 0.9 % SODIUM CHLORIDE (POUR BTL) OPTIME
TOPICAL | Status: DC | PRN
  Administered 2016-09-28: 6000 mL

## 2016-09-28 MED ORDER — ACETAMINOPHEN 650 MG RE SUPP
650.0000 mg | Freq: Once | RECTAL | Status: AC
  Administered 2016-09-28: 650 mg via RECTAL

## 2016-09-28 MED ORDER — METOPROLOL TARTRATE 25 MG/10 ML ORAL SUSPENSION: 12.5000 mg | Freq: Two times a day (BID) | ORAL | Status: DC

## 2016-09-28 MED ORDER — SODIUM CHLORIDE 0.9 % IV SOLN
0.0000 ug/kg/h | INTRAVENOUS | Status: DC
  Filled 2016-09-28 (×2): qty 2

## 2016-09-28 MED ORDER — AMIODARONE HCL IN DEXTROSE 360-4.14 MG/200ML-% IV SOLN
60.0000 mg/h | INTRAVENOUS | Status: AC
  Administered 2016-09-28 (×2): 60 mg/h via INTRAVENOUS
  Filled 2016-09-28: qty 200

## 2016-09-28 MED ORDER — METOCLOPRAMIDE HCL 5 MG/ML IJ SOLN
10.0000 mg | Freq: Four times a day (QID) | INTRAMUSCULAR | Status: AC
  Administered 2016-09-28 – 2016-09-29 (×2): 10 mg via INTRAVENOUS
  Filled 2016-09-28: qty 2

## 2016-09-28 MED ORDER — ASPIRIN EC 325 MG PO TBEC
325.0000 mg | DELAYED_RELEASE_TABLET | Freq: Every day | ORAL | Status: DC
  Administered 2016-09-29 – 2016-10-08 (×10): 325 mg via ORAL
  Filled 2016-09-28 (×10): qty 1

## 2016-09-28 MED ORDER — ACETAMINOPHEN 160 MG/5ML PO SOLN: 1000.0000 mg | Freq: Four times a day (QID) | ORAL | Status: AC

## 2016-09-28 MED ORDER — LACTATED RINGERS IV SOLN
INTRAVENOUS | Status: DC
  Administered 2016-09-28: 15:00:00 via INTRAVENOUS

## 2016-09-28 MED ORDER — SODIUM CHLORIDE 0.9% FLUSH
3.0000 mL | Freq: Two times a day (BID) | INTRAVENOUS | Status: DC
  Administered 2016-09-30 – 2016-10-09 (×15): 3 mL via INTRAVENOUS

## 2016-09-28 MED ORDER — OXYCODONE HCL 5 MG PO TABS
5.0000 mg | ORAL_TABLET | ORAL | Status: DC | PRN
  Administered 2016-09-29 – 2016-10-03 (×5): 10 mg via ORAL
  Administered 2016-10-05 – 2016-10-06 (×7): 5 mg via ORAL
  Administered 2016-10-07 – 2016-10-09 (×7): 10 mg via ORAL
  Filled 2016-09-28: qty 1
  Filled 2016-09-28 (×5): qty 2
  Filled 2016-09-28: qty 1
  Filled 2016-09-28: qty 2
  Filled 2016-09-28: qty 1
  Filled 2016-09-28 (×2): qty 2
  Filled 2016-09-28: qty 1
  Filled 2016-09-28: qty 2
  Filled 2016-09-28: qty 1
  Filled 2016-09-28 (×2): qty 2
  Filled 2016-09-28 (×2): qty 1
  Filled 2016-09-28: qty 2

## 2016-09-28 MED ORDER — ACETAMINOPHEN 500 MG PO TABS
1000.0000 mg | ORAL_TABLET | Freq: Four times a day (QID) | ORAL | Status: AC
  Administered 2016-09-28 – 2016-10-03 (×12): 1000 mg via ORAL
  Filled 2016-09-28 (×13): qty 2

## 2016-09-28 MED ORDER — ASPIRIN 81 MG PO CHEW
324.0000 mg | CHEWABLE_TABLET | Freq: Every day | ORAL | Status: DC
  Filled 2016-09-28 (×2): qty 4

## 2016-09-28 MED ORDER — MIDAZOLAM HCL 10 MG/2ML IJ SOLN
INTRAMUSCULAR | Status: AC
  Filled 2016-09-28: qty 2

## 2016-09-28 MED ORDER — PROPOFOL 10 MG/ML IV BOLUS
INTRAVENOUS | Status: DC | PRN
  Administered 2016-09-28: 40 mg via INTRAVENOUS

## 2016-09-28 MED ORDER — DOCUSATE SODIUM 100 MG PO CAPS
200.0000 mg | ORAL_CAPSULE | Freq: Every day | ORAL | Status: DC
  Administered 2016-09-29 – 2016-10-02 (×4): 200 mg via ORAL
  Filled 2016-09-28 (×5): qty 2

## 2016-09-28 MED ORDER — FENTANYL CITRATE (PF) 250 MCG/5ML IJ SOLN
INTRAMUSCULAR | Status: AC
  Filled 2016-09-28: qty 5

## 2016-09-28 MED ORDER — ALBUMIN HUMAN 5 % IV SOLN
250.0000 mL | INTRAVENOUS | Status: AC | PRN
  Administered 2016-09-28 – 2016-09-29 (×4): 250 mL via INTRAVENOUS
  Filled 2016-09-28 (×2): qty 250

## 2016-09-28 MED ORDER — VANCOMYCIN HCL IN DEXTROSE 1-5 GM/200ML-% IV SOLN
1000.0000 mg | Freq: Once | INTRAVENOUS | Status: DC
  Filled 2016-09-28: qty 200

## 2016-09-28 MED ORDER — MORPHINE SULFATE (PF) 2 MG/ML IV SOLN: 2.0000 mg | INTRAVENOUS | Status: DC | PRN

## 2016-09-28 MED ORDER — MORPHINE SULFATE (PF) 4 MG/ML IV SOLN
2.0000 mg | INTRAVENOUS | Status: DC | PRN
  Administered 2016-09-28 – 2016-09-29 (×2): 2 mg via INTRAVENOUS
  Administered 2016-09-29: 4 mg via INTRAVENOUS
  Administered 2016-09-29 (×3): 2 mg via INTRAVENOUS
  Filled 2016-09-28 (×7): qty 1

## 2016-09-28 MED ORDER — METOPROLOL TARTRATE 12.5 MG HALF TABLET: 12.5000 mg | ORAL_TABLET | Freq: Two times a day (BID) | ORAL | Status: DC

## 2016-09-28 MED ORDER — LACTATED RINGERS IV SOLN: 500.0000 mL | Freq: Once | INTRAVENOUS | Status: DC | PRN

## 2016-09-28 MED ORDER — MAGNESIUM SULFATE 4 GM/100ML IV SOLN
4.0000 g | Freq: Once | INTRAVENOUS | Status: AC
  Administered 2016-09-28: 4 g via INTRAVENOUS
  Filled 2016-09-28: qty 100

## 2016-09-28 MED ORDER — PROTAMINE SULFATE 10 MG/ML IV SOLN
INTRAVENOUS | Status: AC
  Filled 2016-09-28: qty 25

## 2016-09-28 MED ORDER — EPHEDRINE 5 MG/ML INJ
INTRAVENOUS | Status: AC
  Filled 2016-09-28: qty 10

## 2016-09-28 MED ORDER — ARTIFICIAL TEARS OPHTHALMIC OINT
TOPICAL_OINTMENT | OPHTHALMIC | Status: DC | PRN
  Administered 2016-09-28: 1 via OPHTHALMIC

## 2016-09-28 MED ORDER — PHENYLEPHRINE 40 MCG/ML (10ML) SYRINGE FOR IV PUSH (FOR BLOOD PRESSURE SUPPORT)
PREFILLED_SYRINGE | INTRAVENOUS | Status: AC
  Filled 2016-09-28: qty 10

## 2016-09-28 MED ORDER — CALCIUM CHLORIDE 10 % IV SOLN
INTRAVENOUS | Status: DC | PRN
  Administered 2016-09-28: 500 mg via INTRAVENOUS

## 2016-09-28 MED ORDER — INSULIN REGULAR HUMAN 100 UNIT/ML IJ SOLN
INTRAMUSCULAR | Status: DC | PRN
  Administered 2016-09-28: .4 [IU]/h via INTRAVENOUS

## 2016-09-28 MED ORDER — AMIODARONE HCL IN DEXTROSE 360-4.14 MG/200ML-% IV SOLN
30.0000 mg/h | INTRAVENOUS | Status: DC
  Administered 2016-09-29: 30 mg/h via INTRAVENOUS
  Filled 2016-09-28 (×2): qty 200

## 2016-09-28 MED ORDER — CHLORHEXIDINE GLUCONATE 0.12 % MT SOLN
15.0000 mL | OROMUCOSAL | Status: AC
  Administered 2016-09-28: 15 mL via OROMUCOSAL

## 2016-09-28 MED ORDER — SODIUM CHLORIDE 0.9 % IV SOLN: 250.0000 mL | INTRAVENOUS | Status: DC

## 2016-09-28 MED ORDER — SODIUM CHLORIDE 0.9 % IV SOLN
INTRAVENOUS | Status: DC | PRN
  Administered 2016-09-28: 14:00:00 via INTRAVENOUS

## 2016-09-28 MED ORDER — INSULIN REGULAR BOLUS VIA INFUSION
0.0000 [IU] | Freq: Three times a day (TID) | INTRAVENOUS | Status: DC
  Filled 2016-09-28: qty 10

## 2016-09-28 MED FILL — Sodium Chloride IV Soln 0.9%: INTRAVENOUS | Qty: 2000 | Status: AC

## 2016-09-28 MED FILL — Potassium Chloride Inj 2 mEq/ML: INTRAVENOUS | Qty: 5 | Status: AC

## 2016-09-28 MED FILL — Mannitol IV Soln 20%: INTRAVENOUS | Qty: 500 | Status: AC

## 2016-09-28 MED FILL — Heparin Sodium (Porcine) Inj 1000 Unit/ML: INTRAMUSCULAR | Qty: 20 | Status: AC

## 2016-09-28 MED FILL — Sodium Bicarbonate IV Soln 8.4%: INTRAVENOUS | Qty: 50 | Status: AC

## 2016-09-28 MED FILL — Heparin Sodium (Porcine) Inj 1000 Unit/ML: INTRAMUSCULAR | Qty: 30 | Status: AC

## 2016-09-28 MED FILL — Electrolyte-R (PH 7.4) Solution: INTRAVENOUS | Qty: 5000 | Status: AC

## 2016-09-28 MED FILL — Lidocaine HCl IV Inj 20 MG/ML: INTRAVENOUS | Qty: 10 | Status: AC

## 2016-09-28 MED FILL — Magnesium Sulfate Inj 50%: INTRAMUSCULAR | Qty: 2 | Status: AC

## 2016-09-28 SURGICAL SUPPLY — 107 items
ADAPTER CARDIO PERF ANTE/RETRO (ADAPTER) ×4 IMPLANT
APPLICATOR CHLORAPREP 3ML ORNG (MISCELLANEOUS) ×4 IMPLANT
BAG DECANTER FOR FLEXI CONT (MISCELLANEOUS) ×4 IMPLANT
BANDAGE ACE 4X5 VEL STRL LF (GAUZE/BANDAGES/DRESSINGS) ×4 IMPLANT
BANDAGE ACE 6X5 VEL STRL LF (GAUZE/BANDAGES/DRESSINGS) ×4 IMPLANT
BASKET HEART  (ORDER IN 25'S) (MISCELLANEOUS) ×1
BASKET HEART (ORDER IN 25'S) (MISCELLANEOUS) ×1
BASKET HEART (ORDER IN 25S) (MISCELLANEOUS) ×2 IMPLANT
BENZOIN TINCTURE PRP APPL 2/3 (GAUZE/BANDAGES/DRESSINGS) ×4 IMPLANT
BLADE CLIPPER SURG (BLADE) IMPLANT
BLADE EYE MINI 60D BEAVER (BLADE) ×4 IMPLANT
BLADE STERNUM SYSTEM 6 (BLADE) ×4 IMPLANT
BLADE SURG 11 STRL SS (BLADE) ×4 IMPLANT
BLADE SURG 12 STRL SS (BLADE) ×4 IMPLANT
BNDG GAUZE ELAST 4 BULKY (GAUZE/BANDAGES/DRESSINGS) ×4 IMPLANT
CANISTER SUCT 3000ML PPV (MISCELLANEOUS) ×4 IMPLANT
CANNULA GUNDRY RCSP 15FR (MISCELLANEOUS) ×4 IMPLANT
CATH CPB KIT VANTRIGT (MISCELLANEOUS) ×4 IMPLANT
CATH ROBINSON RED A/P 18FR (CATHETERS) ×12 IMPLANT
CATH THORACIC 36FR RT ANG (CATHETERS) ×4 IMPLANT
CLIP FOGARTY SPRING 6M (CLIP) ×4 IMPLANT
CLIP RETRACTION 3.0MM CORONARY (MISCELLANEOUS) ×4 IMPLANT
CLIP TI WIDE RED SMALL 24 (CLIP) ×8 IMPLANT
COVER MAYO STAND STRL (DRAPES) ×4 IMPLANT
CRADLE DONUT ADULT HEAD (MISCELLANEOUS) ×4 IMPLANT
DERMABOND ADVANCED (GAUZE/BANDAGES/DRESSINGS) ×2
DERMABOND ADVANCED .7 DNX12 (GAUZE/BANDAGES/DRESSINGS) ×2 IMPLANT
DRAIN CHANNEL 32F RND 10.7 FF (WOUND CARE) ×4 IMPLANT
DRAPE CARDIOVASCULAR INCISE (DRAPES) ×2
DRAPE SLUSH/WARMER DISC (DRAPES) ×4 IMPLANT
DRAPE SRG 135X102X78XABS (DRAPES) ×2 IMPLANT
DRSG AQUACEL AG ADV 3.5X14 (GAUZE/BANDAGES/DRESSINGS) ×4 IMPLANT
ELECT BLADE 4.0 EZ CLEAN MEGAD (MISCELLANEOUS) ×4
ELECT BLADE 6.5 EXT (BLADE) ×4 IMPLANT
ELECT CAUTERY BLADE 6.4 (BLADE) ×4 IMPLANT
ELECT REM PT RETURN 9FT ADLT (ELECTROSURGICAL) ×8
ELECTRODE BLDE 4.0 EZ CLN MEGD (MISCELLANEOUS) ×2 IMPLANT
ELECTRODE REM PT RTRN 9FT ADLT (ELECTROSURGICAL) ×4 IMPLANT
FELT TEFLON 1X6 (MISCELLANEOUS) ×8 IMPLANT
GAUZE SPONGE 4X4 12PLY STRL (GAUZE/BANDAGES/DRESSINGS) ×8 IMPLANT
GAUZE SPONGE 4X4 12PLY STRL LF (GAUZE/BANDAGES/DRESSINGS) ×8 IMPLANT
GLOVE BIO SURGEON STRL SZ 6 (GLOVE) ×12 IMPLANT
GLOVE BIO SURGEON STRL SZ 6.5 (GLOVE) ×15 IMPLANT
GLOVE BIO SURGEON STRL SZ7 (GLOVE) ×8 IMPLANT
GLOVE BIO SURGEON STRL SZ7.5 (GLOVE) ×16 IMPLANT
GLOVE BIO SURGEONS STRL SZ 6.5 (GLOVE) ×5
GOWN STRL REUS W/ TWL LRG LVL3 (GOWN DISPOSABLE) ×20 IMPLANT
GOWN STRL REUS W/TWL LRG LVL3 (GOWN DISPOSABLE) ×20
HEMOSTAT POWDER SURGIFOAM 1G (HEMOSTASIS) ×12 IMPLANT
HEMOSTAT SURGICEL 2X14 (HEMOSTASIS) ×4 IMPLANT
INSERT FOGARTY XLG (MISCELLANEOUS) IMPLANT
KIT BASIN OR (CUSTOM PROCEDURE TRAY) ×4 IMPLANT
KIT ROOM TURNOVER OR (KITS) ×4 IMPLANT
KIT SUCTION CATH 14FR (SUCTIONS) ×4 IMPLANT
KIT VASOVIEW HEMOPRO VH 3000 (KITS) ×4 IMPLANT
LEAD PACING MYOCARDI (MISCELLANEOUS) ×4 IMPLANT
MARKER GRAFT CORONARY BYPASS (MISCELLANEOUS) ×12 IMPLANT
NS IRRIG 1000ML POUR BTL (IV SOLUTION) ×24 IMPLANT
PACK OPEN HEART (CUSTOM PROCEDURE TRAY) ×4 IMPLANT
PAD ARMBOARD 7.5X6 YLW CONV (MISCELLANEOUS) ×8 IMPLANT
PAD ELECT DEFIB RADIOL ZOLL (MISCELLANEOUS) ×4 IMPLANT
PENCIL BUTTON HOLSTER BLD 10FT (ELECTRODE) ×4 IMPLANT
PUNCH AORTIC ROTATE 4.0MM (MISCELLANEOUS) ×4 IMPLANT
PUNCH AORTIC ROTATE 4.5MM 8IN (MISCELLANEOUS) IMPLANT
PUNCH AORTIC ROTATE 5MM 8IN (MISCELLANEOUS) IMPLANT
SET CARDIOPLEGIA MPS 5001102 (MISCELLANEOUS) ×4 IMPLANT
SPOGE SURGIFLO 8M (HEMOSTASIS) ×6
SPONGE LAP 18X18 X RAY DECT (DISPOSABLE) ×8 IMPLANT
SPONGE SURGIFLO 8M (HEMOSTASIS) ×6 IMPLANT
SURGIFLO W/THROMBIN 8M KIT (HEMOSTASIS) ×8 IMPLANT
SUT BONE WAX W31G (SUTURE) ×4 IMPLANT
SUT MNCRL AB 4-0 PS2 18 (SUTURE) ×4 IMPLANT
SUT PROLENE 3 0 SH DA (SUTURE) ×4 IMPLANT
SUT PROLENE 3 0 SH1 36 (SUTURE) IMPLANT
SUT PROLENE 4 0 RB 1 (SUTURE) ×2
SUT PROLENE 4 0 SH DA (SUTURE) ×4 IMPLANT
SUT PROLENE 4-0 RB1 .5 CRCL 36 (SUTURE) ×2 IMPLANT
SUT PROLENE 5 0 C 1 36 (SUTURE) IMPLANT
SUT PROLENE 6 0 C 1 30 (SUTURE) ×40 IMPLANT
SUT PROLENE 6 0 CC (SUTURE) ×28 IMPLANT
SUT PROLENE 8 0 BV175 6 (SUTURE) ×28 IMPLANT
SUT PROLENE BLUE 7 0 (SUTURE) ×8 IMPLANT
SUT PROLENE POLY MONO (SUTURE) ×4 IMPLANT
SUT SILK  1 MH (SUTURE)
SUT SILK 1 MH (SUTURE) IMPLANT
SUT SILK 2 0 SH CR/8 (SUTURE) ×4 IMPLANT
SUT SILK 3 0 SH CR/8 (SUTURE) ×4 IMPLANT
SUT STEEL 6MS V (SUTURE) ×8 IMPLANT
SUT STEEL SZ 6 DBL 3X14 BALL (SUTURE) ×4 IMPLANT
SUT VIC AB 1 CTX 36 (SUTURE) ×8
SUT VIC AB 1 CTX36XBRD ANBCTR (SUTURE) ×8 IMPLANT
SUT VIC AB 2-0 CT1 27 (SUTURE) ×2
SUT VIC AB 2-0 CT1 TAPERPNT 27 (SUTURE) ×2 IMPLANT
SUT VIC AB 2-0 CTX 27 (SUTURE) IMPLANT
SUT VIC AB 3-0 X1 27 (SUTURE) IMPLANT
SUTURE E-PAK OPEN HEART (SUTURE) ×4 IMPLANT
SYSTEM SAHARA CHEST DRAIN ATS (WOUND CARE) ×4 IMPLANT
TAPE CLOTH SURG 4X10 WHT LF (GAUZE/BANDAGES/DRESSINGS) ×4 IMPLANT
TAPE PAPER 2X10 WHT MICROPORE (GAUZE/BANDAGES/DRESSINGS) ×4 IMPLANT
TOWEL GREEN STERILE (TOWEL DISPOSABLE) ×16 IMPLANT
TOWEL GREEN STERILE FF (TOWEL DISPOSABLE) ×8 IMPLANT
TOWEL OR 17X24 6PK STRL BLUE (TOWEL DISPOSABLE) ×8 IMPLANT
TOWEL OR 17X26 10 PK STRL BLUE (TOWEL DISPOSABLE) ×8 IMPLANT
TRAY FOLEY SILVER 16FR TEMP (SET/KITS/TRAYS/PACK) ×4 IMPLANT
TUBING INSUFFLATION (TUBING) ×4 IMPLANT
UNDERPAD 30X30 (UNDERPADS AND DIAPERS) ×4 IMPLANT
WATER STERILE IRR 1000ML POUR (IV SOLUTION) ×8 IMPLANT

## 2016-09-28 NOTE — Anesthesia Preprocedure Evaluation (Addendum)
Anesthesia Evaluation  Patient identified by MRN, date of birth, ID band Patient awake    Reviewed: Allergy & Precautions, NPO status , Patient's Chart, lab work & pertinent test results  Airway Mallampati: I  TM Distance: >3 FB Neck ROM: Full    Dental  (+) Dental Advisory Given, Teeth Intact   Pulmonary    Pulmonary exam normal        Cardiovascular hypertension, Pt. on medications + CAD and + Past MI  Normal cardiovascular exam     Neuro/Psych    GI/Hepatic   Endo/Other    Renal/GU      Musculoskeletal   Abdominal   Peds  Hematology   Anesthesia Other Findings   Reproductive/Obstetrics                            Anesthesia Physical Anesthesia Plan  ASA: III  Anesthesia Plan: General   Post-op Pain Management:    Induction: Intravenous  PONV Risk Score and Plan: 2 and Treatment may vary due to age or medical condition  Airway Management Planned: Oral ETT  Additional Equipment: Arterial line, PA Cath, TEE and Ultrasound Guidance Line Placement  Intra-op Plan:   Post-operative Plan:   Informed Consent: I have reviewed the patients History and Physical, chart, labs and discussed the procedure including the risks, benefits and alternatives for the proposed anesthesia with the patient or authorized representative who has indicated his/her understanding and acceptance.     Plan Discussed with: CRNA, Surgeon and Anesthesiologist  Anesthesia Plan Comments:        Anesthesia Quick Evaluation

## 2016-09-28 NOTE — OR Nursing (Signed)
13:15 - 45 minute call to SICU

## 2016-09-28 NOTE — Anesthesia Procedure Notes (Signed)
Procedure Name: Intubation Date/Time: 09/28/2016 7:55 AM Performed by: Julieta Bellini Pre-anesthesia Checklist: Patient identified, Emergency Drugs available, Suction available and Patient being monitored Patient Re-evaluated:Patient Re-evaluated prior to inductionOxygen Delivery Method: Circle system utilized Preoxygenation: Pre-oxygenation with 100% oxygen Intubation Type: IV induction Ventilation: Mask ventilation without difficulty Laryngoscope Size: Mac and 4 Grade View: Grade I Tube type: Oral Tube size: 7.5 mm Number of attempts: 1 Airway Equipment and Method: Stylet Placement Confirmation: ETT inserted through vocal cords under direct vision,  positive ETCO2 and breath sounds checked- equal and bilateral Secured at: 25 cm Tube secured with: Tape Dental Injury: Teeth and Oropharynx as per pre-operative assessment

## 2016-09-28 NOTE — Anesthesia Postprocedure Evaluation (Signed)
Anesthesia Post Note  Patient: Ernest Booker  Procedure(s) Performed: Procedure(s) (LRB): CORONARY ARTERY BYPASS GRAFTING (CABG) x four, using left internal mammary artery and right leg greater saphenous vein harvested endoscopically (N/A) TRANSESOPHAGEAL ECHOCARDIOGRAM (TEE) (N/A)     Patient location during evaluation: SICU Anesthesia Type: General Level of consciousness: sedated Pain management: pain level controlled Vital Signs Assessment: post-procedure vital signs reviewed and stable Respiratory status: patient remains intubated per anesthesia plan Cardiovascular status: stable Anesthetic complications: no    Last Vitals:  Vitals:   09/28/16 0723 09/28/16 0724  BP:    Pulse: (!) 58 (!) 58  Resp: 14 13  Temp:      Last Pain:  Vitals:   09/28/16 0300  TempSrc: Oral  PainSc:                  Ernest Booker

## 2016-09-28 NOTE — Procedures (Addendum)
Extubation Procedure Note  Patient Details:   Name: Ernest Booker DOB: October 02, 1945 MRN: [id number]   Airway Documentation:     Evaluation  O2 sats: stable throughout Complications: No apparent complications Patient did tolerate procedure well. Bilateral Breath Sounds: Clear   Yes   Pt extubated to 3L [place] per Open Heart Rapid Wean Protocol. VC 0.65, NIF -55, ABG within normal limits. Positive cuff leak noted. Pt stable throughout with no complications. Pt able to speak and has a strong, non-productive cough post extubation. RN at bedside. RT will continue to monitor.    Jesse Sans 09/28/2016, 7:01 PM

## 2016-09-28 NOTE — OR Nursing (Signed)
14:00 - 20 minute call to SICU

## 2016-09-28 NOTE — Brief Op Note (Addendum)
09/24/2016 - 09/28/2016  12:22 PM  PATIENT:  Ernest Booker  71 y.o. male  PRE-OPERATIVE DIAGNOSIS:  CAD  POST-OPERATIVE DIAGNOSIS:  CAD  PROCEDURE: TRANSESOPHAGEAL ECHOCARDIOGRAM (TEE) ,MEDIAN STERNOTOMY for CORONARY ARTERY BYPASS GRAFTING (CABG) x 4 (LIMA to LAD, SVG to DIAGONAL, SVG to RAMUS INTERMEDIATE, and SVG to PDA), using left internal mammary artery and right leg greater saphenous vein harvested endoscopically (N/A)  SURGEON:  Surgeon(s) and Role:    Ivin Poot, MD - Primary  PHYSICIAN ASSISTANT: Lars Pinks PA-C  ANESTHESIA:   general  EBL:  Total I/O In: 1300 [I.V.:1300] Out: 575 [Urine:575]  BLOOD ADMINISTERED:Two  FFP and one PLTS  DRAINS: Chest tubes placed in the mediastinal and pleural spaces   COUNTS CORRECT:  YES  DICTATION: .Dragon Dictation  PLAN OF CARE: Admit to inpatient   PATIENT DISPOSITION:  ICU - intubated and hemodynamically stable.   Delay start of Pharmacological VTE agent (>24hrs) due to surgical blood loss or risk of bleeding: yes  BASELINE WEIGHT: 89 kg

## 2016-09-28 NOTE — Anesthesia Procedure Notes (Signed)
Central Venous Catheter Insertion Performed by: Oleta Mouse, anesthesiologist Start/End6/02/2017 7:09 AM, 09/28/2016 7:16 AM Patient location: Pre-op. Preanesthetic checklist: patient identified, IV checked, site marked, risks and benefits discussed, surgical consent, monitors and equipment checked, pre-op evaluation, timeout performed and anesthesia consent Hand hygiene performed  and maximum sterile barriers used  PA cath was placed.Swan type:thermodilution PA Cath depth:45 Procedure performed without using ultrasound guided technique. Attempts: 1 Patient tolerated the procedure well with no immediate complications.

## 2016-09-28 NOTE — Anesthesia Procedure Notes (Signed)
Central Venous Catheter Insertion Performed by: Oleta Mouse, anesthesiologist Start/End6/02/2017 7:09 AM, 09/28/2016 7:16 AM Patient location: Pre-op. Preanesthetic checklist: patient identified, IV checked, site marked, risks and benefits discussed, surgical consent, monitors and equipment checked, pre-op evaluation, timeout performed and anesthesia consent Lidocaine 1% used for infiltration and patient sedated Hand hygiene performed  and maximum sterile barriers used  Catheter size: 8.5 Fr Total catheter length 10. Sheath introducer Procedure performed using ultrasound guided technique. Ultrasound Notes:anatomy identified, needle tip was noted to be adjacent to the nerve/plexus identified, no ultrasound evidence of intravascular and/or intraneural injection and image(s) printed for medical record Attempts: 1 Following insertion, line sutured and dressing applied. Post procedure assessment: blood return through all ports, free fluid flow and no air  Patient tolerated the procedure well with no immediate complications.

## 2016-09-28 NOTE — Progress Notes (Signed)
      [address]       [place] [address]             [phone]      S/p CABG x 4  Intubated, sedated  BP 90/71   Pulse 70   Temp 97.3 F (36.3 C)   Resp 16   Ht 5\' 6"  (1.676 m)   Wt 196 lb 6.9 oz (89.1 kg)   SpO2 100%   BMI 31.70 kg/m   Intake/Output Summary (Last 24 hours) at 09/28/16 1724 Last data filed at 09/28/16 1605  Gross per 24 hour  Intake          4084.44 ml  Output             2380 ml  Net          1704.44 ml   PA= 43/28, CI 1.4  There is a lot of respiratory variation on the PA tracing- likely still intravascularly dry Will give an additional albumin. Also turned pacer up to 80 bpm On milrinone, consider adding dopamine if index remains low  Remo Lipps C. Roxan Hockey, MD [facility] [phone]

## 2016-09-28 NOTE — Transfer of Care (Signed)
Immediate Anesthesia Transfer of Care Note  Patient: Ernest Booker  Procedure(s) Performed: Procedure(s): CORONARY ARTERY BYPASS GRAFTING (CABG) x four, using left internal mammary artery and right leg greater saphenous vein harvested endoscopically (N/A) TRANSESOPHAGEAL ECHOCARDIOGRAM (TEE) (N/A)  Patient Location: SICU  Anesthesia Type:General  Level of Consciousness: sedated and Patient remains intubated per anesthesia plan  Airway & Oxygen Therapy: Patient remains intubated per anesthesia plan and Patient placed on Ventilator (see vital sign flow sheet for setting)  Post-op Assessment: Report given to RN and Post -op Vital signs reviewed and stable  Post vital signs: Reviewed and stable  Last Vitals:  Vitals:   09/28/16 0723 09/28/16 0724  BP:    Pulse: (!) 58 (!) 58  Resp: 14 13  Temp:      Last Pain:  Vitals:   09/28/16 0300  TempSrc: Oral  PainSc:       Patients Stated Pain Goal: 0 (63/01/60 1093)  Complications: No apparent anesthesia complications   Patient transported to ICU with standard monitors (HR, BP, SPO2, RR) and emergency drugs/equipment. Controlled ventilation maintained via ambu bag. Report given to bedside RN and respiratory therapist. All questioned answered before leaving.

## 2016-09-28 NOTE — Progress Notes (Signed)
Initiated open heart rapid wean per protocol. RN aware. MD at bedside. Rt will continue to monitor.

## 2016-09-28 NOTE — Progress Notes (Signed)
Patient transported to OR via stretcher with RN. Report given to OR nurse at bedside. Patient has hearing aides d/t very [place]. Family in waiting room. Wife has all personal belongings.

## 2016-09-28 NOTE — Progress Notes (Signed)
  Echocardiogram Echocardiogram Transesophageal has been performed.  Ernest Booker Ernest Booker 09/28/2016, 10:08 AM

## 2016-09-28 NOTE — Op Note (Signed)
NAMEADAMA, FERBER NO.:  [id number]  MEDICAL RECORD NO.:  [id number]  LOCATION:  [facility]                         FACILITY:  [facility]  PHYSICIAN:  Ivin Poot, M.D.  DATE OF BIRTH:  Jul 02, 1945  DATE OF PROCEDURE:  09/28/2016 DATE OF DISCHARGE:                              OPERATIVE REPORT   OPERATION: 1. Coronary artery bypass grafting x4 (left internal mammary artery to     LAD, saphenous vein graft to diagonal, saphenous vein graft to     ramus branch of the circumflex, saphenous vein graft to posterior     descending branch of the RCA). 2. Endoscopic harvest of right leg greater saphenous vein.  SURGEON:  Ivin Poot, M.D.  ASSISTANT:  Lars Pinks, PA-C.  PREOPERATIVE DIAGNOSES:  Non-ST elevation MI, unstable angina, severe three-vessel coronary artery disease.  POSTOPERATIVE DIAGNOSES:  Non-ST elevation MI, unstable angina, severe three-vessel coronary artery disease.  ANESTHESIA:  General by Dr. Lillia Abed.  INDICATIONS:  The patient is an obese 71 year old, nondiabetic male, who presented with symptoms of unstable angina and positive cardiac enzymes. Cardiac catheterization was performed after he was transferred from [facility].  This demonstrated severe multivessel coronary artery disease with preserved LV function.  He was felt to be a candidate for surgical revascularization based on his coronary anatomy and symptoms. I discussed the procedure of CABG with the patient.  I discussed the major aspects of the operation including the use of general anesthesia and cardiopulmonary bypass, the location of the surgical incisions, and the expected postoperative hospital recovery.  I discussed with the patient the risks of the operation including risks of the operation with the risks of stroke, bleeding, blood transfusion requirement, infection, postoperative pulmonary problems including pleural effusion, MI, and death.  After  reviewing the coronary arteriograms, I noted that the patient's coronary vessels were very small on the left side and this would also increase risk of surgery.  The right coronary was large and hyper dominant.  After reviewing all of these issues with the patient, he demonstrated his understanding and agreed to proceed with surgery under what I felt was an informed consent.  FINDINGS: 1. Very small left-sided vessels, suboptimal targets, but graftable     vessels to the LAD, diagonal, and ramus branch of the circumflex.     The distal circumflex vessels were too small to graft. 2. Preserved LV systolic function after separation from     cardiopulmonary bypass. 3. Adequate conduit, although the IMA was small.  DESCRIPTION OF PROCEDURE:  The patient was brought to the operating room, placed supine on the operating room table.  General anesthesia was induced under invasive hemodynamic monitor.  The chest, abdomen, and legs were prepped with Betadine and draped as a sterile field.  A proper time-out was performed.  A sternal incision was made as the saphenous vein was harvested endoscopically from the right leg.  The left internal mammary artery was harvested as a pedicle graft from its origin at the subclavian vessels.  It was 1.5-mm vessel with good flow.  The sternal retractor was placed.  The pericardium was opened and suspended. Pursestrings were placed in the  ascending aorta and right atrium.  After heparin was administered and the ACT was documented as being therapeutic, the patient was cannulated and placed on cardiopulmonary bypass.  The coronary arteries were identified for grafting.  The mammary artery and vein grafts were prepared for the distal anastomoses. Cardioplegia cannulas were placed both antegrade and retrograde cold blood cardioplegia.  The patient was cooled to 32 degrees and aortic crossclamp was applied.  One liter of cold blood cardioplegia was delivered in split  doses between the antegrade aortic and retrograde coronary sinus catheters.  There was good cardioplegic arrest and septal temperature dropped less than 14 degrees.  Cardioplegia was delivered every 20 minutes or less.  The distal coronary anastomoses were performed.  The first distal anastomosis was the posterior descending.  This had an 89% stenosis in the mid vessel.  The graft was placed distally.  The vessel was 1.5 mm. A reverse saphenous vein was sewn end-to-side with running 7-0 Prolene and there was good flow through the graft.  Cardioplegia was redosed.  The second distal anastomosis was to the ramus branch of the circumflex. This was a small 1 mm vessel.  A reverse saphenous vein was sewn end-to- side with a running 8-0 Prolene.  There was adequate flow through the graft.  Cardioplegia reduced was redosed.  The third distal anastomosis was the first diagonal branch to the LAD. This had an ostial 90% stenosis.  It was a 1 mm vessel.  A reverse saphenous vein was sewn end-to-side with running 7-0 Prolene with good flow through the graft.  Cardioplegia was redosed.  The fourth distal anastomosis was placed to the distal LAD.  It was diffusely diseased, calcified.  Where we did the anastomosis, there was a posterior plaque.  However, the 1 mm probe passed distally to the apex.  The left IMA pedicle was brought through an opening created in the left lateral pericardium, it was brought down onto the LAD and sewn end-to-side with a running 8-0 Prolene.  There was good flow through the anastomosis after briefly releasing the pedicle bulldog on the mammary artery.  The bulldog was reapplied.  The pedicle was secured with epicardium.  Cardioplegia was redosed.  While the crossclamp was still in place, 3 proximal vein anastomoses were performed on the ascending aorta using a 4.5-mm punch and running 6- 0 Prolene.  Prior to removing the crossclamp, air was vented from the coronary  arteries with a dose of retrograde warm blood cardioplegia in the usual de-airing maneuvers on bypass.  The crossclamp was removed.  The heart resumed spontaneous beating.  The vein grafts were de-aired, unclamped, and opened.  Each had adequate flow and hemostasis was documented proximal to the distal anastomoses.  The patient was rewarmed and reperfused.  Temporary pacing wires were applied.  The lungs were expanded.  Ventilator was resumed.  The patient was weaned from cardiopulmonary bypass without difficulty on low-dose milrinone.  Echo showed preserved LV systolic function.  The patient received protamine without adverse reaction.  Hemodynamics remained stable.  There was diffuse coagulopathy.  The patient was given FFP with improvement in coagulation function.  The pericardial fat over the aorta was closed with interrupted silk sutures.  The chest was drained with an anterior mediastinal and a left pleural chest tube, brought out through separate incisions.  The sternum was closed with wire.  The patient remained stable.  The pectoralis fascia was closed with a running #1 Vicryl.  The subcutaneous and skin layers were  closed in running Vicryl.  Sterile dressings were applied.  Total cardiopulmonary bypass time was 165 minutes.     Ivin Poot, M.D.     PV/MEDQ  D:  09/28/2016  T:  09/28/2016  Job:  [id number]  cc:   Quay Burow, M.D.

## 2016-09-29 ENCOUNTER — Inpatient Hospital Stay (HOSPITAL_COMMUNITY): Payer: BLUE CROSS/BLUE SHIELD

## 2016-09-29 ENCOUNTER — Encounter (HOSPITAL_COMMUNITY): Payer: Self-pay | Admitting: Cardiothoracic Surgery

## 2016-09-29 LAB — MAGNESIUM
Magnesium: 2.3 mg/dL (ref 1.7–2.4)
Magnesium: 2.2 mg/dL (ref 1.7–2.4)

## 2016-09-29 LAB — BASIC METABOLIC PANEL
Anion gap: 6 (ref 5–15)
BUN: 10 mg/dL (ref 6–20)
CHLORIDE: 106 mmol/L (ref 101–111)
CO2: 22 mmol/L (ref 22–32)
Calcium: 7.7 mg/dL — ABNORMAL LOW (ref 8.9–10.3)
Creatinine, Ser: 1.25 mg/dL — ABNORMAL HIGH (ref 0.61–1.24)
GFR calc Af Amer: >60 mL/min (ref >60)
GFR calc non Af Amer: 57 mL/min — ABNORMAL LOW (ref >60)
Glucose, Bld: 144 mg/dL — ABNORMAL HIGH (ref 65–99)
POTASSIUM: 4.5 mmol/L (ref 3.5–5.1)
SODIUM: 134 mmol/L — AB (ref 135–145)

## 2016-09-29 LAB — GLUCOSE, CAPILLARY
GLUCOSE-CAPILLARY: 130 mg/dL — AB (ref 65–99)
GLUCOSE-CAPILLARY: 139 mg/dL — AB (ref 65–99)
GLUCOSE-CAPILLARY: 135 mg/dL — AB (ref 65–99)
Glucose-Capillary: 129 mg/dL — ABNORMAL HIGH (ref 65–99)
Glucose-Capillary: 133 mg/dL — ABNORMAL HIGH (ref 65–99)
Glucose-Capillary: 128 mg/dL — ABNORMAL HIGH (ref 65–99)

## 2016-09-29 LAB — CBC
HCT: 28.2 % — ABNORMAL LOW (ref 39.0–52.0)
HEMATOCRIT: 30.2 % — AB (ref 39.0–52.0)
Hemoglobin: 9.5 g/dL — ABNORMAL LOW (ref 13.0–17.0)
Hemoglobin: 10 g/dL — ABNORMAL LOW (ref 13.0–17.0)
MCH: 29.7 pg (ref 26.0–34.0)
MCH: 29.6 pg (ref 26.0–34.0)
MCHC: 33.7 g/dL (ref 30.0–36.0)
MCHC: 33.1 g/dL (ref 30.0–36.0)
MCV: 88.1 fL (ref 78.0–100.0)
MCV: 89.3 fL (ref 78.0–100.0)
PLATELETS: 120 10*3/uL — AB (ref 150–400)
Platelets: 128 10*3/uL — ABNORMAL LOW (ref 150–400)
RBC: 3.2 MIL/uL — ABNORMAL LOW (ref 4.22–5.81)
RBC: 3.38 MIL/uL — ABNORMAL LOW (ref 4.22–5.81)
RDW: 13.9 % (ref 11.5–15.5)
RDW: 14.4 % (ref 11.5–15.5)
WBC: 10.8 10*3/uL — AB (ref 4.0–10.5)
WBC: 11.6 10*3/uL — AB (ref 4.0–10.5)

## 2016-09-29 LAB — PREPARE FRESH FROZEN PLASMA
UNIT DIVISION: 0
UNIT DIVISION: 0

## 2016-09-29 LAB — POCT I-STAT, CHEM 8
BUN: 11 mg/dL (ref 6–20)
CREATININE: 1.2 mg/dL (ref 0.61–1.24)
Calcium, Ion: 1.19 mmol/L (ref 1.15–1.40)
Chloride: 100 mmol/L — ABNORMAL LOW (ref 101–111)
Glucose, Bld: 131 mg/dL — ABNORMAL HIGH (ref 65–99)
HEMATOCRIT: 28 % — AB (ref 39.0–52.0)
HEMOGLOBIN: 9.5 g/dL — AB (ref 13.0–17.0)
POTASSIUM: 4.2 mmol/L (ref 3.5–5.1)
SODIUM: 135 mmol/L (ref 135–145)
TCO2: 24 mmol/L (ref 0–100)

## 2016-09-29 LAB — BPAM FFP
BLOOD PRODUCT EXPIRATION DATE: 201806162359
Blood Product Expiration Date: 201806162359
ISSUE DATE / TIME: 201806111148
ISSUE DATE / TIME: 201806111148
UNIT TYPE AND RH: 6200
UNIT TYPE AND RH: 6200

## 2016-09-29 LAB — CREATININE, SERUM
Creatinine, Ser: 1.18 mg/dL (ref 0.61–1.24)
GFR calc Af Amer: >60 mL/min (ref >60)

## 2016-09-29 MED ORDER — AMIODARONE HCL 200 MG PO TABS
200.0000 mg | ORAL_TABLET | Freq: Every day | ORAL | Status: DC
  Administered 2016-09-29 – 2016-09-30 (×2): 200 mg via ORAL
  Filled 2016-09-29 (×2): qty 1

## 2016-09-29 MED ORDER — INSULIN ASPART 100 UNIT/ML ~~LOC~~ SOLN: 0.0000 [IU] | SUBCUTANEOUS | Status: DC

## 2016-09-29 MED ORDER — FUROSEMIDE 10 MG/ML IJ SOLN
20.0000 mg | Freq: Two times a day (BID) | INTRAMUSCULAR | Status: DC
Start: 2016-09-29 — End: 2016-09-30
  Administered 2016-09-29 – 2016-09-30 (×3): 20 mg via INTRAVENOUS
  Filled 2016-09-29 (×3): qty 2

## 2016-09-29 MED FILL — Perflutren Lipid Microsphere IV Susp 1.1 MG/ML: INTRAVENOUS | Qty: 10 | Status: AC

## 2016-09-29 NOTE — Progress Notes (Signed)
CT surgery p.m. Rounds  Patient examined and record reviewed.Hemodynamics stable,labs satisfactory.Patient had stable day.Continue current care. Ernest Booker 09/29/2016

## 2016-09-29 NOTE — Progress Notes (Signed)
1 Day Post-Op Procedure(s) (LRB): CORONARY ARTERY BYPASS GRAFTING (CABG) x four, using left internal mammary artery and right leg greater saphenous vein harvested endoscopically (N/A) TRANSESOPHAGEAL ECHOCARDIOGRAM (TEE) (N/A) Subjective: Extubated Slow sinus rhythm woth PACs, a-pacing CXR wetr- lasix ordered Cardiac index 2.5- 3  Objective: Vital signs in last 24 hours: Temp:  [96.4 F (35.8 C)-100.6 F (38.1 C)] 99 F (37.2 C) (06/12 1000) Pulse Rate:  [60-91] 90 (06/12 1100) Cardiac Rhythm: Atrial paced (06/12 0800) Resp:  [6-31] 24 (06/12 1100) BP: (77-115)/(52-72) 96/65 (06/12 1000) SpO2:  [93 %-100 %] 97 % (06/12 1100) Arterial Line BP: (69-139)/(41-76) 135/57 (06/12 1100) FiO2 (%):  [50 %] 50 % (06/11 1437) Weight:  [218 lb 7.6 oz (99.1 kg)] 218 lb 7.6 oz (99.1 kg) (06/12 0432)  Hemodynamic parameters for last 24 hours: PAP: (25-58)/(6-42) 29/15 CO:  [2.8 L/min-6.3 L/min] 6.3 L/min CI:  [1.4 L/min/m2-3.2 L/min/m2] 3.2 L/min/m2  Intake/Output from previous day: 06/11 0701 - 06/12 0700 In: 6841.6 [I.V.:4595.6; Blood:796; IV Piggyback:1450] Out: 9629 [Urine:2250; Blood:875; Chest Tube:520] Intake/Output this shift: Total I/O In: 760.1 [P.O.:120; I.V.:490.1; IV Piggyback:150] Out: 905 [Urine:875; Chest Tube:30]       Exam    General- alert and comfortable   Lungs- clear without rales, wheezes   Cor- regular rate and rhythm, no murmur , gallop   Abdomen- soft, non-tender   Extremities - warm, non-tender, minimal edema   Neuro- oriented, appropriate, no focal weakness   Lab Results:  Recent Labs  09/28/16 2022 09/28/16 2026 09/29/16 0336  WBC 11.5*  --  10.8*  HGB 9.7* 8.8* 9.5*  HCT 28.7* 26.0* 28.2*  PLT 126*  --  128*   BMET:  Recent Labs  09/28/16 0430  09/28/16 2026 09/29/16 0336  NA 137  < > 137 134*  K 3.6  < > 4.3 4.5  CL 108  < > 104 106  CO2 23  --   --  22  GLUCOSE 104*  < > 175* 144*  BUN 12  < > 10 10  CREATININE 1.24  < > 1.10  1.25*  CALCIUM 8.7*  --   --  7.7*  < > = values in this interval not displayed.  PT/INR:  Recent Labs  09/28/16 1449  LABPROT 17.3*  INR 1.41   ABG    Component Value Date/Time   PHART 7.336 (L) 09/28/2016 2025   HCO3 21.1 09/28/2016 2025   TCO2 22 09/28/2016 2026   ACIDBASEDEF 4.0 (H) 09/28/2016 2025   O2SAT 93.0 09/28/2016 2025   CBG (last 3)   Recent Labs  09/29/16 0316 09/29/16 0750 09/29/16 1123  GLUCAP 129* 130* 133*    Assessment/Plan: S/P Procedure(s) (LRB): CORONARY ARTERY BYPASS GRAFTING (CABG) x four, using left internal mammary artery and right leg greater saphenous vein harvested endoscopically (N/A) TRANSESOPHAGEAL ECHOCARDIOGRAM (TEE) (N/A) Diuresis Mobilize progressionn orders Transition to low dose po amiodarone for postop afib   LOS: 5 days    Ernest Booker 09/29/2016

## 2016-09-29 NOTE — Care Management Note (Signed)
Case Management Note Marvetta Gibbons RN, BSN Unit 2W-Case Manager-- [place] coverage [phone]  Patient Details  Name: Ernest Booker MRN: [id number] Date of Birth: April 20, 1946  Subjective/Objective:   Pt admitted to [facility] as a  transfer from outside hospital with non-STEMI. Cath reveled MVD- pt s/p CABGx4 on 09/28/16                Action/Plan: PTA pt lived at home with wife- active and independent- anticipate pt will return home with wife- CM will follow pt progression post op.   Expected Discharge Date:                  Expected Discharge Plan:     In-House Referral:     Discharge planning Services  CM Consult  Post Acute Care Choice:    Choice offered to:     DME Arranged:    DME Agency:     HH Arranged:    HH Agency:     Status of Service:  In process, will continue to follow  If discussed at Long Length of Stay Meetings, dates discussed:    Discharge Disposition:   Additional Comments:  Dawayne Patricia, RN 09/29/2016, 9:47 AM

## 2016-09-30 ENCOUNTER — Inpatient Hospital Stay (HOSPITAL_COMMUNITY): Payer: BLUE CROSS/BLUE SHIELD

## 2016-09-30 LAB — BASIC METABOLIC PANEL
Anion gap: 7 (ref 5–15)
BUN: 12 mg/dL (ref 6–20)
CO2: 23 mmol/L (ref 22–32)
Calcium: 7.7 mg/dL — ABNORMAL LOW (ref 8.9–10.3)
Chloride: 100 mmol/L — ABNORMAL LOW (ref 101–111)
Creatinine, Ser: 1.13 mg/dL (ref 0.61–1.24)
GFR calc Af Amer: >60 mL/min (ref >60)
GFR calc non Af Amer: >60 mL/min (ref >60)
Glucose, Bld: 112 mg/dL — ABNORMAL HIGH (ref 65–99)
Potassium: 3.8 mmol/L (ref 3.5–5.1)
Sodium: 130 mmol/L — ABNORMAL LOW (ref 135–145)

## 2016-09-30 LAB — CBC
HCT: 30.1 % — ABNORMAL LOW (ref 39.0–52.0)
Hemoglobin: 10 g/dL — ABNORMAL LOW (ref 13.0–17.0)
MCH: 29.9 pg (ref 26.0–34.0)
MCHC: 33.2 g/dL (ref 30.0–36.0)
MCV: 89.9 fL (ref 78.0–100.0)
Platelets: 115 10*3/uL — ABNORMAL LOW (ref 150–400)
RBC: 3.35 MIL/uL — ABNORMAL LOW (ref 4.22–5.81)
RDW: 14.5 % (ref 11.5–15.5)
WBC: 11.7 10*3/uL — ABNORMAL HIGH (ref 4.0–10.5)

## 2016-09-30 LAB — GLUCOSE, CAPILLARY
GLUCOSE-CAPILLARY: 127 mg/dL — AB (ref 65–99)
GLUCOSE-CAPILLARY: 131 mg/dL — AB (ref 65–99)
Glucose-Capillary: 116 mg/dL — ABNORMAL HIGH (ref 65–99)
Glucose-Capillary: 129 mg/dL — ABNORMAL HIGH (ref 65–99)
Glucose-Capillary: 143 mg/dL — ABNORMAL HIGH (ref 65–99)

## 2016-09-30 LAB — TYPE AND SCREEN
ABO/RH(D): A POS
Antibody Screen: NEGATIVE
Unit division: 0
Unit division: 0

## 2016-09-30 LAB — POCT I-STAT 3, ART BLOOD GAS (G3+)
Bicarbonate: 24.2 mmol/L (ref 20.0–28.0)
O2 Saturation: 93 %
PH ART: 7.433 (ref 7.350–7.450)
TCO2: 25 mmol/L (ref 0–100)
pCO2 arterial: 36.1 mmHg (ref 32.0–48.0)
pO2, Arterial: 63 mmHg — ABNORMAL LOW (ref 83.0–108.0)

## 2016-09-30 LAB — POCT I-STAT, CHEM 8
BUN: 13 mg/dL (ref 6–20)
CREATININE: 1.1 mg/dL (ref 0.61–1.24)
Calcium, Ion: 1.18 mmol/L (ref 1.15–1.40)
Chloride: 96 mmol/L — ABNORMAL LOW (ref 101–111)
GLUCOSE: 139 mg/dL — AB (ref 65–99)
HCT: 28 % — ABNORMAL LOW (ref 39.0–52.0)
HEMOGLOBIN: 9.5 g/dL — AB (ref 13.0–17.0)
Potassium: 4 mmol/L (ref 3.5–5.1)
Sodium: 130 mmol/L — ABNORMAL LOW (ref 135–145)
TCO2: 26 mmol/L (ref 0–100)

## 2016-09-30 LAB — BPAM RBC
Blood Product Expiration Date: 201806212359
Blood Product Expiration Date: 201806212359
ISSUE DATE / TIME: 201806061306
Unit Type and Rh: 6200
Unit Type and Rh: 6200

## 2016-09-30 LAB — HEMOGLOBIN A1C
HEMOGLOBIN A1C: 5.7 % — AB (ref 4.8–5.6)
Mean Plasma Glucose: 117 mg/dL

## 2016-09-30 LAB — COOXEMETRY PANEL
Carboxyhemoglobin: 1.1 % (ref 0.5–1.5)
Methemoglobin: 1.4 % (ref 0.0–1.5)
O2 Saturation: 55.7 %
Total hemoglobin: 10.4 g/dL — ABNORMAL LOW (ref 12.0–16.0)

## 2016-09-30 MED ORDER — FUROSEMIDE 10 MG/ML IJ SOLN
40.0000 mg | Freq: Two times a day (BID) | INTRAMUSCULAR | Status: DC
  Administered 2016-09-30 – 2016-10-04 (×9): 40 mg via INTRAVENOUS
  Filled 2016-09-30 (×10): qty 4

## 2016-09-30 MED ORDER — SODIUM CHLORIDE 0.9% FLUSH
10.0000 mL | Freq: Two times a day (BID) | INTRAVENOUS | Status: DC
  Administered 2016-09-30: 20 mL

## 2016-09-30 MED ORDER — LEVALBUTEROL HCL 0.63 MG/3ML IN NEBU
0.6300 mg | INHALATION_SOLUTION | RESPIRATORY_TRACT | Status: DC | PRN
  Administered 2016-09-30: 0.63 mg via RESPIRATORY_TRACT

## 2016-09-30 MED ORDER — FUROSEMIDE 10 MG/ML IJ SOLN
20.0000 mg | Freq: Once | INTRAMUSCULAR | Status: AC
  Administered 2016-09-30: 20 mg via INTRAVENOUS
  Filled 2016-09-30: qty 2

## 2016-09-30 MED ORDER — PROMETHAZINE HCL 25 MG/ML IJ SOLN
12.5000 mg | Freq: Four times a day (QID) | INTRAMUSCULAR | Status: DC | PRN
  Administered 2016-09-30 – 2016-10-09 (×4): 12.5 mg via INTRAVENOUS
  Filled 2016-09-30 (×4): qty 1

## 2016-09-30 MED ORDER — SODIUM CHLORIDE 0.9% FLUSH: 10.0000 mL | INTRAVENOUS | Status: DC | PRN

## 2016-09-30 MED ORDER — DILTIAZEM LOAD VIA INFUSION
10.0000 mg | Freq: Once | INTRAVENOUS | Status: AC
  Administered 2016-09-30: 10 mg via INTRAVENOUS
  Filled 2016-09-30: qty 10

## 2016-09-30 MED ORDER — MILRINONE LACTATE IN DEXTROSE 20-5 MG/100ML-% IV SOLN
0.1250 ug/kg/min | INTRAVENOUS | Status: AC
  Administered 2016-09-30: 0.25 ug/kg/min via INTRAVENOUS
  Administered 2016-10-01 (×2): 0.375 ug/kg/min via INTRAVENOUS
  Administered 2016-10-01 – 2016-10-05 (×7): 0.25 ug/kg/min via INTRAVENOUS
  Filled 2016-09-30 (×11): qty 100

## 2016-09-30 MED ORDER — LEVALBUTEROL HCL 0.63 MG/3ML IN NEBU
INHALATION_SOLUTION | RESPIRATORY_TRACT | Status: AC
  Filled 2016-09-30: qty 3

## 2016-09-30 MED ORDER — DILTIAZEM HCL 100 MG IV SOLR
10.0000 mg/h | INTRAVENOUS | Status: DC
  Administered 2016-09-30 – 2016-10-01 (×2): 10 mg/h via INTRAVENOUS
  Filled 2016-09-30 (×3): qty 100

## 2016-09-30 MED ORDER — MILRINONE LACTATE IN DEXTROSE 20-5 MG/100ML-% IV SOLN: 0.2500 ug/kg/min | INTRAVENOUS | Status: DC

## 2016-09-30 MED ORDER — FUROSEMIDE 10 MG/ML IJ SOLN
40.0000 mg | Freq: Once | INTRAMUSCULAR | Status: AC
  Administered 2016-09-30: 40 mg via INTRAVENOUS
  Filled 2016-09-30: qty 4

## 2016-09-30 MED ORDER — ORAL CARE MOUTH RINSE
15.0000 mL | Freq: Two times a day (BID) | OROMUCOSAL | Status: DC
  Administered 2016-09-30 – 2016-10-09 (×11): 15 mL via OROMUCOSAL

## 2016-09-30 MED ORDER — CHLORHEXIDINE GLUCONATE CLOTH 2 % EX PADS
6.0000 | MEDICATED_PAD | Freq: Every day | CUTANEOUS | Status: DC
  Administered 2016-09-30 – 2016-10-09 (×7): 6 via TOPICAL

## 2016-09-30 NOTE — Progress Notes (Signed)
Procedure(s) (LRB): CORONARY ARTERY BYPASS GRAFTING (CABG) x four, using left internal mammary artery and right leg greater saphenous vein harvested endoscopically (N/A) TRANSESOPHAGEAL ECHOCARDIOGRAM (TEE) (N/A) Subjective: Feels poorly, BP stable, a-pacing Wheezing with ambulation Low lung volumes with ambulation Will stop amiodarone  Objective: Vital signs in last 24 hours: Temp:  [97.9 F (36.6 C)-99 F (37.2 C)] 97.9 F (36.6 C) (06/13 0739) Pulse Rate:  [88-90] 89 (06/13 0800) Cardiac Rhythm: Atrial paced (06/13 0800) Resp:  [16-25] 21 (06/13 0800) BP: (94-123)/(63-79) 123/73 (06/13 0800) SpO2:  [93 %-99 %] 98 % (06/13 0800) Arterial Line BP: (94-142)/(44-65) 135/58 (06/12 1800) Weight:  [217 lb 2.5 oz (98.5 kg)] 217 lb 2.5 oz (98.5 kg) (06/13 0444)  Hemodynamic parameters for last 24 hours: PAP: (29)/(15) 29/15  Intake/Output from previous day: 06/12 0701 - 06/13 0700 In: 1312.7 [P.O.:360; I.V.:752.7; IV Piggyback:200] Out: 2090 [Urine:2050; Chest Tube:40] Intake/Output this shift: No intake/output data recorded.      Exam    General- alert and comfortable   Lungs- clear without rales, wheezes   Cor- regular rate and rhythm, no murmur , gallop   Abdomen- soft, non-tender   Extremities - warm, non-tender, minimal edema   Neuro- oriented, appropriate, no focal weakness   Lab Results:  Recent Labs  09/29/16 1705 09/29/16 1718 09/30/16 0410  WBC 11.6*  --  11.7*  HGB 10.0* 9.5* 10.0*  HCT 30.2* 28.0* 30.1*  PLT 120*  --  115*   BMET:  Recent Labs  09/29/16 0336  09/29/16 1718 09/30/16 0410  NA 134*  --  135 130*  K 4.5  --  4.2 3.8  CL 106  --  100* 100*  CO2 22  --   --  23  GLUCOSE 144*  --  131* 112*  BUN 10  --  11 12  CREATININE 1.25*  < > 1.20 1.13  CALCIUM 7.7*  --   --  7.7*  < > = values in this interval not displayed.  PT/INR:  Recent Labs  09/28/16 1449  LABPROT 17.3*  INR 1.41   ABG    Component Value Date/Time   PHART  7.336 (L) 09/28/2016 2025   HCO3 21.1 09/28/2016 2025   TCO2 24 09/29/2016 1718   ACIDBASEDEF 4.0 (H) 09/28/2016 2025   O2SAT 93.0 09/28/2016 2025   CBG (last 3)   Recent Labs  09/29/16 2328 09/30/16 0338 09/30/16 0737  GLUCAP 128* 116* 127*    Assessment/Plan: S/P Procedure(s) (LRB): CORONARY ARTERY BYPASS GRAFTING (CABG) x four, using left internal mammary artery and right leg greater saphenous vein harvested endoscopically (N/A) TRANSESOPHAGEAL ECHOCARDIOGRAM (TEE) (N/Aphysical therapy   LOS: 6 days  Diuresis PT Stop amiodarone Check co-ox  Tharon Aquas Trigt III 09/30/2016

## 2016-09-30 NOTE — Progress Notes (Signed)
      [address]       [place] [address]             [phone]      POD # 2 CABG  Up in chair, no complaints at present  BP 131/82   Pulse 89   Temp 99.3 F (37.4 C) (Oral)   Resp (!) 22   Ht 5\' 6"  (1.676 m)   Wt 217 lb 2.5 oz (98.5 kg)   SpO2 97%   BMI 35.05 kg/m    Intake/Output Summary (Last 24 hours) at 09/30/16 1737 Last data filed at 09/30/16 1600  Gross per 24 hour  Intake            411.6 ml  Output             1925 ml  Net          -1513.4 ml   Co-ox= 56  Shareta Fishbaugh C. Roxan Hockey, MD [facility] [phone]

## 2016-09-30 NOTE — Progress Notes (Signed)
Paged MD Roxan Hockey regarding patient respiratory status. Patient has audible wheezing heard from the hallway, although lungs sound clear and diminished in the bases. Patient now on non-rebreather with O2 sats 92-95%. Patient is using his accessory muscles to breathe and is slow to respond to voice. He is Alert and Oriented x4. I checked a CVP which was 16. BP 112/69. HR 100-120s in afib with occasional v pacing at backup of 60. Orders received to give 40mg  of lasix, STAT chest x-ray, and obtain an ABG. Will call physician back when all orders have been completed. Will continue to monitor patient.  Ernest Hedger, RN

## 2016-09-30 NOTE — Progress Notes (Signed)
Peripherally Inserted Central Catheter/Midline Placement  The IV Nurse has discussed with the patient and/or persons authorized to consent for the patient, the purpose of this procedure and the potential benefits and risks involved with this procedure.  The benefits include less needle sticks, lab draws from the catheter, and the patient may be discharged home with the catheter. Risks include, but not limited to, infection, bleeding, blood clot (thrombus formation), and puncture of an artery; nerve damage and irregular heartbeat and possibility to perform a PICC exchange if needed/ordered by physician.  Alternatives to this procedure were also discussed.  Bard Power PICC patient education guide, fact sheet on infection prevention and patient information card has been provided to patient /or left at bedside.    PICC/Midline Placement Documentation        Ernest Booker 09/30/2016, 11:17 AM

## 2016-09-30 NOTE — Progress Notes (Signed)
Paged MD Roxan Hockey back with results of chest x-ray, response to lasix (300 uop), CVP 15, patient believed to have more of an upper airway stridor rather than wheeze. Patient is still using some accessory muscles, however the patient states he feels more comfortable. Orders received to turn milrinone to 0.376mcg. Will continue to monitor patient.  Levon Hedger, RN

## 2016-09-30 NOTE — Progress Notes (Signed)
Paged MD Roxan Hockey regarding patients audible wheezing, complaint of shortness of breath, and nausea. Lung sounds clear, however wheezing is heard upon entrance to the room. HR afib rate 100-120s, with an occasional ventricular pace back up. BP 125/72. O2 sat 89% on [place] 6L. Orders received to give a breathing treatment and 12.5mg  of phenergan. Will continue to monitor patient.  Levon Hedger, RN

## 2016-10-01 ENCOUNTER — Inpatient Hospital Stay (HOSPITAL_COMMUNITY): Payer: BLUE CROSS/BLUE SHIELD

## 2016-10-01 DIAGNOSIS — J9601 Acute respiratory failure with hypoxia: Secondary | ICD-10-CM

## 2016-10-01 DIAGNOSIS — K219 Gastro-esophageal reflux disease without esophagitis: Secondary | ICD-10-CM

## 2016-10-01 DIAGNOSIS — J810 Acute pulmonary edema: Secondary | ICD-10-CM

## 2016-10-01 DIAGNOSIS — I360 Nonrheumatic tricuspid (valve) stenosis: Secondary | ICD-10-CM

## 2016-10-01 DIAGNOSIS — R05 Cough: Secondary | ICD-10-CM

## 2016-10-01 DIAGNOSIS — R570 Cardiogenic shock: Secondary | ICD-10-CM

## 2016-10-01 LAB — GLUCOSE, CAPILLARY
GLUCOSE-CAPILLARY: 135 mg/dL — AB (ref 65–99)
Glucose-Capillary: 113 mg/dL — ABNORMAL HIGH (ref 65–99)
Glucose-Capillary: 139 mg/dL — ABNORMAL HIGH (ref 65–99)
Glucose-Capillary: 147 mg/dL — ABNORMAL HIGH (ref 65–99)
Glucose-Capillary: 167 mg/dL — ABNORMAL HIGH (ref 65–99)
Glucose-Capillary: 180 mg/dL — ABNORMAL HIGH (ref 65–99)

## 2016-10-01 LAB — CBC
HCT: 28.1 % — ABNORMAL LOW (ref 39.0–52.0)
Hemoglobin: 9.6 g/dL — ABNORMAL LOW (ref 13.0–17.0)
MCH: 30.3 pg (ref 26.0–34.0)
MCHC: 34.2 g/dL (ref 30.0–36.0)
MCV: 88.6 fL (ref 78.0–100.0)
Platelets: 138 10*3/uL — ABNORMAL LOW (ref 150–400)
RBC: 3.17 MIL/uL — ABNORMAL LOW (ref 4.22–5.81)
RDW: 14 % (ref 11.5–15.5)
WBC: 14.2 10*3/uL — ABNORMAL HIGH (ref 4.0–10.5)

## 2016-10-01 LAB — PROCALCITONIN: Procalcitonin: 0.82 ng/mL

## 2016-10-01 LAB — POCT I-STAT, CHEM 8
BUN: 15 mg/dL (ref 6–20)
BUN: 18 mg/dL (ref 6–20)
BUN: 22 mg/dL — ABNORMAL HIGH (ref 6–20)
CALCIUM ION: 1.16 mmol/L (ref 1.15–1.40)
CALCIUM ION: 1.16 mmol/L (ref 1.15–1.40)
CALCIUM ION: 1.19 mmol/L (ref 1.15–1.40)
CHLORIDE: 95 mmol/L — AB (ref 101–111)
CHLORIDE: 94 mmol/L — AB (ref 101–111)
CHLORIDE: 94 mmol/L — AB (ref 101–111)
CREATININE: 1 mg/dL (ref 0.61–1.24)
Creatinine, Ser: 1.4 mg/dL — ABNORMAL HIGH (ref 0.61–1.24)
Creatinine, Ser: 1.6 mg/dL — ABNORMAL HIGH (ref 0.61–1.24)
GLUCOSE: 185 mg/dL — AB (ref 65–99)
GLUCOSE: 197 mg/dL — AB (ref 65–99)
Glucose, Bld: 150 mg/dL — ABNORMAL HIGH (ref 65–99)
HCT: 27 % — ABNORMAL LOW (ref 39.0–52.0)
HCT: 27 % — ABNORMAL LOW (ref 39.0–52.0)
HEMATOCRIT: 28 % — AB (ref 39.0–52.0)
HEMOGLOBIN: 9.2 g/dL — AB (ref 13.0–17.0)
Hemoglobin: 9.2 g/dL — ABNORMAL LOW (ref 13.0–17.0)
Hemoglobin: 9.5 g/dL — ABNORMAL LOW (ref 13.0–17.0)
POTASSIUM: 4.4 mmol/L (ref 3.5–5.1)
POTASSIUM: 4.1 mmol/L (ref 3.5–5.1)
Potassium: 4.2 mmol/L (ref 3.5–5.1)
SODIUM: 131 mmol/L — AB (ref 135–145)
Sodium: 132 mmol/L — ABNORMAL LOW (ref 135–145)
Sodium: 130 mmol/L — ABNORMAL LOW (ref 135–145)
TCO2: 27 mmol/L (ref 0–100)
TCO2: 24 mmol/L (ref 0–100)
TCO2: 23 mmol/L (ref 0–100)

## 2016-10-01 LAB — ECHOCARDIOGRAM COMPLETE
E decel time: 197 msec
E/e' ratio: 12.76
FS: 48 % — AB (ref 28–44)
Height: 66 in
IVS/LV PW RATIO, ED: 0.95
LA ID, A-P, ES: 43 mm
LA diam end sys: 43 mm
LA diam index: 2.09 cm/m2
LA vol A4C: 42.8 ml
LA vol index: 20 mL/m2
LA vol: 41.1 mL
LV E/e' medial: 12.76
LV E/e'average: 12.76
LV PW d: 12.2 mm — AB (ref 0.6–1.1)
LV e' LATERAL: 8.7 cm/s
LVOT area: 3.8 cm2
LVOT diameter: 22 mm
Lateral S' vel: 10.3 cm/s
MV Dec: 197
MV Peak grad: 5 mmHg
MV pk A vel: 79.1 m/s
MV pk E vel: 111 m/s
TDI e' lateral: 8.7
TDI e' medial: 9.46
Weight: 3428.59 oz

## 2016-10-01 LAB — COMPREHENSIVE METABOLIC PANEL
ALT: 26 U/L (ref 17–63)
AST: 29 U/L (ref 15–41)
Albumin: 2.9 g/dL — ABNORMAL LOW (ref 3.5–5.0)
Alkaline Phosphatase: 32 U/L — ABNORMAL LOW (ref 38–126)
Anion gap: 8 (ref 5–15)
BUN: 14 mg/dL (ref 6–20)
CO2: 27 mmol/L (ref 22–32)
Calcium: 7.9 mg/dL — ABNORMAL LOW (ref 8.9–10.3)
Chloride: 97 mmol/L — ABNORMAL LOW (ref 101–111)
Creatinine, Ser: 1.38 mg/dL — ABNORMAL HIGH (ref 0.61–1.24)
GFR calc Af Amer: 59 mL/min — ABNORMAL LOW (ref >60)
GFR calc non Af Amer: 51 mL/min — ABNORMAL LOW (ref >60)
Glucose, Bld: 169 mg/dL — ABNORMAL HIGH (ref 65–99)
Potassium: 3.7 mmol/L (ref 3.5–5.1)
Sodium: 132 mmol/L — ABNORMAL LOW (ref 135–145)
Total Bilirubin: 1.1 mg/dL (ref 0.3–1.2)
Total Protein: 5.2 g/dL — ABNORMAL LOW (ref 6.5–8.1)

## 2016-10-01 LAB — POCT I-STAT 3, ART BLOOD GAS (G3+)
Acid-Base Excess: 1 mmol/L (ref 0.0–2.0)
BICARBONATE: 24.6 mmol/L (ref 20.0–28.0)
O2 Saturation: 93 %
PCO2 ART: 35.8 mmHg (ref 32.0–48.0)
PO2 ART: 64 mmHg — AB (ref 83.0–108.0)
Patient temperature: 98.5
TCO2: 26 mmol/L (ref 0–100)
pH, Arterial: 7.444 (ref 7.350–7.450)

## 2016-10-01 LAB — COOXEMETRY PANEL
Carboxyhemoglobin: 0.8 % (ref 0.5–1.5)
Methemoglobin: 1.3 % (ref 0.0–1.5)
O2 Saturation: 50.5 %
Total hemoglobin: 9.7 g/dL — ABNORMAL LOW (ref 12.0–16.0)

## 2016-10-01 LAB — CORTISOL-AM, BLOOD: Cortisol - AM: 27.9 ug/dL — ABNORMAL HIGH (ref 6.7–22.6)

## 2016-10-01 MED ORDER — AMIODARONE IV BOLUS ONLY 150 MG/100ML
150.0000 mg | Freq: Once | INTRAVENOUS | Status: AC
  Administered 2016-10-01: 150 mg via INTRAVENOUS
  Filled 2016-10-01: qty 100

## 2016-10-01 MED ORDER — ALBUMIN HUMAN 5 % IV SOLN
12.5000 g | Freq: Once | INTRAVENOUS | Status: AC
  Administered 2016-10-01: 12.5 g via INTRAVENOUS
  Filled 2016-10-01: qty 250

## 2016-10-01 MED ORDER — PANTOPRAZOLE SODIUM 40 MG IV SOLR
40.0000 mg | Freq: Two times a day (BID) | INTRAVENOUS | Status: DC
  Administered 2016-10-01 – 2016-10-02 (×4): 40 mg via INTRAVENOUS
  Filled 2016-10-01 (×4): qty 40

## 2016-10-01 MED ORDER — PIPERACILLIN-TAZOBACTAM 3.375 G IVPB 30 MIN
3.3750 g | Freq: Once | INTRAVENOUS | Status: AC
  Administered 2016-10-01: 3.375 g via INTRAVENOUS
  Filled 2016-10-01: qty 50

## 2016-10-01 MED ORDER — LEVALBUTEROL HCL 1.25 MG/0.5ML IN NEBU
1.2500 mg | INHALATION_SOLUTION | Freq: Four times a day (QID) | RESPIRATORY_TRACT | Status: DC
  Administered 2016-10-01 – 2016-10-04 (×13): 1.25 mg via RESPIRATORY_TRACT
  Filled 2016-10-01 (×15): qty 0.5

## 2016-10-01 MED ORDER — SODIUM CHLORIDE 0.9 % IV SOLN: INTRAVENOUS | Status: DC | PRN

## 2016-10-01 MED ORDER — METOCLOPRAMIDE HCL 5 MG/ML IJ SOLN
10.0000 mg | Freq: Four times a day (QID) | INTRAMUSCULAR | Status: AC
  Administered 2016-10-01 – 2016-10-05 (×16): 10 mg via INTRAVENOUS
  Filled 2016-10-01 (×16): qty 2

## 2016-10-01 MED ORDER — AMIODARONE HCL IN DEXTROSE 360-4.14 MG/200ML-% IV SOLN
60.0000 mg/h | INTRAVENOUS | Status: AC
  Administered 2016-10-01 (×2): 60 mg/h via INTRAVENOUS
  Filled 2016-10-01 (×2): qty 200

## 2016-10-01 MED ORDER — ALBUMIN HUMAN 25 % IV SOLN
12.5000 g | Freq: Four times a day (QID) | INTRAVENOUS | Status: DC
  Administered 2016-10-01 – 2016-10-02 (×3): 12.5 g via INTRAVENOUS
  Filled 2016-10-01 (×4): qty 50

## 2016-10-01 MED ORDER — POTASSIUM CHLORIDE 10 MEQ/50ML IV SOLN
10.0000 meq | INTRAVENOUS | Status: AC
  Administered 2016-10-01 (×3): 10 meq via INTRAVENOUS
  Filled 2016-10-01 (×3): qty 50

## 2016-10-01 MED ORDER — LEVALBUTEROL HCL 1.25 MG/0.5ML IN NEBU
1.2500 mg | INHALATION_SOLUTION | Freq: Four times a day (QID) | RESPIRATORY_TRACT | Status: DC
  Administered 2016-10-01: 1.25 mg via RESPIRATORY_TRACT

## 2016-10-01 MED ORDER — CALCIUM GLUCONATE 10 % IV SOLN
1.0000 g | Freq: Once | INTRAVENOUS | Status: DC
  Filled 2016-10-01: qty 10

## 2016-10-01 MED ORDER — PIPERACILLIN-TAZOBACTAM 3.375 G IVPB
3.3750 g | Freq: Three times a day (TID) | INTRAVENOUS | Status: AC
  Administered 2016-10-01 – 2016-10-08 (×22): 3.375 g via INTRAVENOUS
  Filled 2016-10-01 (×23): qty 50

## 2016-10-01 MED ORDER — CALCIUM CHLORIDE 10 % IV SOLN
1.0000 g | Freq: Once | INTRAVENOUS | Status: AC
  Administered 2016-10-01: 1 g via INTRAVENOUS

## 2016-10-01 MED ORDER — AMIODARONE HCL IN DEXTROSE 360-4.14 MG/200ML-% IV SOLN
30.0000 mg/h | INTRAVENOUS | Status: DC
  Administered 2016-10-01 – 2016-10-05 (×10): 30 mg/h via INTRAVENOUS
  Filled 2016-10-01 (×5): qty 200
  Filled 2016-10-01: qty 400
  Filled 2016-10-01 (×5): qty 200

## 2016-10-01 MED ORDER — DOPAMINE-DEXTROSE 3.2-5 MG/ML-% IV SOLN
2.0000 ug/kg/min | INTRAVENOUS | Status: DC
  Administered 2016-10-01: 2.5 ug/kg/min via INTRAVENOUS
  Filled 2016-10-01: qty 250

## 2016-10-01 MED ORDER — POTASSIUM CHLORIDE CRYS ER 20 MEQ PO TBCR: 30.0000 meq | EXTENDED_RELEASE_TABLET | Freq: Once | ORAL | Status: DC

## 2016-10-01 MED ORDER — ENOXAPARIN SODIUM 40 MG/0.4ML ~~LOC~~ SOLN
40.0000 mg | SUBCUTANEOUS | Status: DC
  Administered 2016-10-01 – 2016-10-07 (×7): 40 mg via SUBCUTANEOUS
  Filled 2016-10-01 (×7): qty 0.4

## 2016-10-01 NOTE — Progress Notes (Signed)
Nurse paged me as patient more tachypneic and despite being on a NRB, oxygen desataturation into the high 80's at times. Echo done earlier this am showed LVEF 65-70%, small posterior pericardial effusion, no SAM or LVOT gradient, and volume appears low., On exam, patient in some distress, tachypneic with RR high 20's, in a fib with HR in the 110-120's, upper airway stridor. Respiratory at bedside. Placed on bi pap and stat ABG ordered. As discussed with Dr. Prescott Gum, will ask respiratory to place a line. Patient has not responded to Lasix. Will give Albumin with calcium. Will ask CCM/Pulmonary to evaluate. Patient may need intubated.

## 2016-10-01 NOTE — Progress Notes (Signed)
PT Cancellation Note  Patient Details Name: Ernest Booker MRN: [id number] DOB: Oct 26, 1945   Cancelled Treatment:    Reason Eval/Treat Not Completed: Medical issues which prohibited therapy.  Pt had to placed on BiPAP per RN and she recommends holding today.  PT to check back tomorrow.   Thanks,   Barbarann Ehlers. Jumanah Hynson, PT, DPT [phone]   10/01/2016, 3:10 PM

## 2016-10-01 NOTE — Progress Notes (Signed)
  Echocardiogram 2D Echocardiogram has been performed.  Ernest Booker 10/01/2016, 9:11 AM

## 2016-10-01 NOTE — Progress Notes (Signed)
Pharmacy Antibiotic Note  Ernest Booker is a 71 y.o. male S/P CABG on 6/11 now noted with acute respiratory failure. Pharmacy consulted for Zosyn  Dosing for possible PNA. -WBC= 14.2, SCr= 1.4 (trend up), CrCl ~ 55  Plan: -Zosyn 3.375gm IV q8h -Will follow renal function, cultures and clinical progress   Height: 5\' 6"  (167.6 cm) Weight: 214 lb 4.6 oz (97.2 kg) IBW/kg (Calculated) : 63.8  Temp (24hrs), Avg:98.2 F (36.8 C), Min:97.2 F (36.2 C), Max:99.3 F (37.4 C)   Recent Labs Lab 09/28/16 2022  09/29/16 0336 09/29/16 1705  09/30/16 0410 09/30/16 1127 09/30/16 1708 10/01/16 0226 10/01/16 0828  WBC 11.5*  --  10.8* 11.6*  --  11.7*  --   --  14.2*  --   CREATININE 1.24  < > 1.25* 1.18  < > 1.13 1.10 1.00 1.38* 1.40*  < > = values in this interval not displayed.  Estimated Creatinine Clearance: 55.1 mL/min (A) (by C-G formula based on SCr of 1.4 mg/dL (H)).    Allergies  Allergen Reactions  . Benadryl [Diphenhydramine Hcl (Sleep)]     UNSPECIFIED REACTION   . Levofloxacin     UNSPECIFIED REACTION     Antimicrobials this admission: 6/14 zosyn>>  Dose adjustments this admission:   Microbiology results: 6/8 MRSA PCR- neg  Thank you for allowing pharmacy to be a part of this patient's care.  Hildred Laser, Pharm D 10/01/2016 1:12 PM

## 2016-10-01 NOTE — Consult Note (Signed)
PULMONARY / CRITICAL CARE MEDICINE   Name: Ernest Booker MRN: [id number] DOB: 29-Jun-1945    ADMISSION DATE:  09/24/2016 CONSULTATION DATE:  6/14  REFERRING MD:  Prescott Gum   CHIEF COMPLAINT:  Acute respiratory failure   HISTORY OF PRESENT ILLNESS:   This is a 71 year old male w/ h/o HL and HTN, who was transferred from [facility] on 6/7 after being admitted for chest pain. Underwent cardiac cath which showed multivessel disease and was referred here for CABG. He underwent 4V CABG on 6/11.  He was extubated on 6/12. On 6/13 he was noted to be feeling poorly and had increased audible wheezing w/ ambulation. Also has had intermittent nausea and vomited the am of 6/14. Was felt volume overloaded and treated w/ additional diuresis. Later that evening again noted to have increased wheezing (w/out stethoscope) and desaturations requiring 6 liters to get sats to 89%. Treated again this time w/ lasix and nebs but team concerned about stridor. On 6/14 RR worse, desaturated to high 80s on NRB mask. Placed on NIPPV for loud upper airway noises. PCCM asked to assess.   PAST MEDICAL HISTORY :  He  has a past medical history of Anginal pain ([place]); Arthritis; Coronary artery disease; Hyperlipidemia; Hypertension; and Myocardial infarction ([place]).  PAST SURGICAL HISTORY: He  has a past surgical history that includes Left Heart Cath and Coronary Angiography (N/A, 09/24/2016); Coronary artery bypass graft (N/A, 09/28/2016); and TEE without cardioversion (N/A, 09/28/2016).  Allergies  Allergen Reactions  . Benadryl [Diphenhydramine Hcl (Sleep)]     UNSPECIFIED REACTION   . Levofloxacin     UNSPECIFIED REACTION     No current facility-administered medications on file prior to encounter.    No current outpatient prescriptions on file prior to encounter.    FAMILY HISTORY:  His indicated that the status of his mother is unknown. He indicated that the status of his father is unknown.    SOCIAL  HISTORY: He  reports that he has never smoked. He has never used smokeless tobacco. He reports that he does not drink alcohol or use drugs. Non-smoker  REVIEW OF SYSTEMS:   Feels better on NIPPV   SUBJECTIVE:  Feels better on NIPPV   VITAL SIGNS: BP (!) 91/56   Pulse (!) 118   Temp 97.2 F (36.2 C) (Axillary)   Resp 16   Ht 5\' 6"  (1.676 m)   Wt 214 lb 4.6 oz (97.2 kg)   SpO2 99%   BMI 34.59 kg/m   HEMODYNAMICS: CVP:  [15 mmHg-16 mmHg] 16 mmHg  VENTILATOR SETTINGS: Vent Mode: BIPAP FiO2 (%):  [50 %-100 %] 100 % Set Rate:  [12 bmp] 12 bmp Pressure Support:  [4 cmH20] 4 cmH20  INTAKE / OUTPUT: I/O last 3 completed shifts: In: 910.2 [P.O.:390; I.V.:370.2; IV Piggyback:150] Out: 3050 [Urine:3050]  PHYSICAL EXAMINATION: General appearance:  71 Year old  Male, well nourished, on NIPPV  Eyes: anicteric sclerae icteric , moist conjunctivae; PERRL, EOMI bilaterally. Mouth:  membranes and no mucosal ulcerations; normal hard and soft palate Neck: Trachea midline; neck supple, no JVD Lungs/chest: CTA, with normal respiratory effort and no intercostal retractions when on BIPAP mid-sternal incision CD&I CV: RRR, no MRGs  Abdomen: Soft, non-tender; no masses or HSM Extremities: No peripheral edema or extremity lymphadenopathy Skin: Normal temperature, turgor and texture; no rash, ulcers or subcutaneous nodules Psych: Appropriate affect, alert and oriented to person, place and time   LABS:  BMET  Recent Labs Lab 09/29/16 0336  09/30/16 0410  09/30/16 1708 10/01/16 0226 10/01/16 0828  NA 134*  < > 130*  < > 132* 132* 130*  K 4.5  < > 3.8  < > 4.4 3.7 4.2  CL 106  < > 100*  < > 95* 97* 94*  CO2 22  --  23  --   --  27  --   BUN 10  < > 12  < > 15 14 18   CREATININE 1.25*  < > 1.13  < > 1.00 1.38* 1.40*  GLUCOSE 144*  < > 112*  < > 185* 169* 197*  < > = values in this interval not displayed.  Electrolytes  Recent Labs Lab 09/28/16 2022 09/29/16 0336 09/29/16 1705  09/30/16 0410 10/01/16 0226  CALCIUM  --  7.7*  --  7.7* 7.9*  MG 2.6* 2.3 2.2  --   --     CBC  Recent Labs Lab 09/29/16 1705  09/30/16 0410  09/30/16 1708 10/01/16 0226 10/01/16 0828  WBC 11.6*  --  11.7*  --   --  14.2*  --   HGB 10.0*  < > 10.0*  < > 9.2* 9.6* 9.2*  HCT 30.2*  < > 30.1*  < > 27.0* 28.1* 27.0*  PLT 120*  --  115*  --   --  138*  --   < > = values in this interval not displayed.  Coag's  Recent Labs Lab 09/28/16 1449  APTT 29  INR 1.41    Sepsis Markers No results for input(s): LATICACIDVEN, PROCALCITON, O2SATVEN in the last 168 hours.  ABG  Recent Labs Lab 09/28/16 1857 09/28/16 2025 09/30/16 2152  PHART 7.351 7.336* 7.433  PCO2ART 35.1 39.6 36.1  PO2ART 95.0 71.0* 63.0*    Liver Enzymes  Recent Labs Lab 10/01/16 0226  AST 29  ALT 26  ALKPHOS 32*  BILITOT 1.1  ALBUMIN 2.9*    Cardiac Enzymes No results for input(s): TROPONINI, PROBNP in the last 168 hours.  Glucose  Recent Labs Lab 09/30/16 1154 09/30/16 1622 09/30/16 1950 09/30/16 2344 10/01/16 0346 10/01/16 0808  GLUCAP 129* 131* 143* 139* 113* 180*    Imaging Dg Chest Port 1 View  Result Date: 10/01/2016 CLINICAL DATA:  Shortness of breath and weakness EXAM: PORTABLE CHEST 1 VIEW COMPARISON:  09/30/2016 FINDINGS: Cardiac shadow remains enlarged. Postsurgical changes are again seen. Right-sided PICC line is stable. Bilateral pleural effusions and likely underlying atelectasis are present but stable. No new focal abnormality is seen. IMPRESSION: Stable appearance of the chest. Electronically Signed   By: Inez Catalina M.D.   On: 10/01/2016 07:33   Dg Chest Port 1 View  Result Date: 09/30/2016 CLINICAL DATA:  Dyspnea EXAM: PORTABLE CHEST 1 VIEW COMPARISON:  09/30/2016 FINDINGS: Status post median sternotomy with stable cardiomegaly. Aortic atherosclerosis. Small bilateral pleural effusions with bibasilar atelectasis, similar in appearance to previous comparison. Low  lung volumes are again noted. No acute nor suspicious osseous abnormalities. Osteoarthritis of the glenohumeral joints. Right-sided PICC line tip noted near the cavoatrial juncture. IMPRESSION: 1. Low lung volumes without bibasilar atelectasis and probable small bilateral pleural effusions. 2. Stable cardiomegaly with aortic atherosclerosis. 3. PICC line tip near the cavoatrial juncture. Electronically Signed   By: Ashley Royalty M.D.   On: 09/30/2016 22:05     STUDIES:  ECHO 6/14>>>  CULTURES:   ANTIBIOTICS: Zosyn 6/14>>>  SIGNIFICANT EVENTS:   LINES/TUBES:   DISCUSSION: 71 year old male s/p 4 V CABG 6/11. PCCM asked  to see 6/14 for intermittent hypoxia and presumed laryngospasm.   ASSESSMENT / PLAN:  Acute hypoxic respiratory failure in setting of what seems to be transient Laryngospasm +/-bronchospasm,  superimposed on element of pulmonary edema/volume overload. Can't exclude aspiration event -he has a h/o severe GERD -has had nausea and vomited this am -audible wheeze @ bedside that resolves w/ positive pressure -suspect that he is refluxing and irritating vocal cords resulting transient laryngospasm +/- bronchospasm.  Plan Cont NIPPV (on 2-3 hrs; followed by cycles of rest) Continue lasix as BUN/cr allows PCT algo Empiric zosyn (day 1) 1 view abd r/o ileus.  Agree w/ scheduled reglan PPI BID    CAD now s/p 4V CABG AF w/ RVR Plan:  Cont tele  Await ECHO Rate control and inotropic support per IM.    Mild AKI Fluid and electrolyt Plan Trend I&O Careful w/ diuresis  Renal dose meds F/u am chemistry   GERD, gastric distention, N&V. R/o ileus  Plan Bowel rest PPI 1 view abd   Acute blood loss anemia Plan Trend cbc Transfuse per protocol    Hyperglycemia Plan ssi    My ccm time 35 minutes  Erick Colace ACNP-BC [place] Pager # [phone] OR # [phone] if no answer   10/01/2016, 11:33 AM  Attending Note:  71 year old  male with history of GERD and gastric distension who presents to PCCM post CABG.  Patient was diuresed but continued to have respiratory distress.  Patient was started on BiPAP with significant improvement in respiratory symptoms post BiPAP.  PCCM was consulted for respiratory failure.  With BiPAP the lungs are clear to auscultation.  I reviewed CXR myself, pulmonary edema however improving is noted.  Will continue BiPAP for now.  Continue diureses. Monitor in the ICU for evidence of respiratory deterioration.  Double GERD treatment and PCCM will continue to follow.  The patient is critically ill with multiple organ systems failure and requires high complexity decision making for assessment and support, frequent evaluation and titration of therapies, application of advanced monitoring technologies and extensive interpretation of multiple databases.   Critical Care Time devoted to patient care services described in this note is  35  Minutes. This time reflects time of care of this signee Dr [name]. This critical care time does not reflect procedure time, or teaching time or supervisory time of PA/NP/Med student/Med Resident etc but could involve care discussion time.  Rush Farmer, M.D. [facility] Pulmonary/Critical Care Medicine. Pager: [phone]. After hours pager: [phone].

## 2016-10-01 NOTE — Procedures (Signed)
Arterial Catheter Insertion Procedure Note Ernest Booker [id number] March 29, 1946  Procedure: Insertion of Arterial Catheter  Indications: Blood pressure monitoring and Frequent blood sampling  Procedure Details Consent: Unable to obtain consent because of altered level of consciousness. Time Out: Verified patient identification, verified procedure, site/side was marked, verified correct patient position, special equipment/implants available, medications/allergies/relevent history reviewed, required imaging and test results available.  Performed  Maximum sterile technique was used including antiseptics, cap, gloves, hand hygiene and mask. Skin prep: Chlorhexidine; local anesthetic administered 20 gauge catheter was inserted into left radial artery using the Seldinger technique.  Evaluation Blood flow good; BP tracing good. Complications: No apparent complications.   [name] 10/01/2016

## 2016-10-01 NOTE — Progress Notes (Addendum)
[facility] DAILY ICU PROGRESS NOTE                   [address]            [place] [address]          [phone]   3 Days Post-Op Procedure(s) (LRB): CORONARY ARTERY BYPASS GRAFTING (CABG) x four, using left internal mammary artery and right leg greater saphenous vein harvested endoscopically (N/A) TRANSESOPHAGEAL ECHOCARDIOGRAM (TEE) (N/A)  Total Length of Stay:  LOS: 7 days   Subjective: Patient states he just does not feel well. He has nausea.  Objective: Vital signs in last 24 hours: Temp:  [97.9 F (36.6 C)-99.3 F (37.4 C)] 98.2 F (36.8 C) (06/14 0330) Pulse Rate:  [55-141] 112 (06/14 0700) Cardiac Rhythm: Atrial fibrillation;Ventricular paced (06/14 0600) Resp:  [10-30] 22 (06/14 0700) BP: (88-141)/(52-106) 116/64 (06/14 0700) SpO2:  [87 %-100 %] 97 % (06/14 0700) FiO2 (%):  [50 %-100 %] 100 % (06/13 2140) Weight:  [97.2 kg (214 lb 4.6 oz)] 97.2 kg (214 lb 4.6 oz) (06/14 0500)  Filed Weights   09/29/16 0432 09/30/16 0444 10/01/16 0500  Weight: 99.1 kg (218 lb 7.6 oz) 98.5 kg (217 lb 2.5 oz) 97.2 kg (214 lb 4.6 oz)    Weight change: -1.3 kg (-2 lb 13.9 oz)   Hemodynamic parameters for last 24 hours: CVP:  [15 mmHg-16 mmHg] 15 mmHg  Intake/Output from previous day: 06/13 0701 - 06/14 0700 In: 530.2 [P.O.:150; I.V.:280.2; IV Piggyback:100] Out: 2650 [Urine:2650]  Intake/Output this shift: Total I/O In: 50 [IV Piggyback:50] Out: -   Current Meds: Scheduled Meds: . acetaminophen  1,000 mg Oral Q6H   Or  . acetaminophen (TYLENOL) oral liquid 160 mg/5 mL  1,000 mg Per Tube Q6H  . aspirin EC  325 mg Oral Daily   Or  . aspirin  324 mg Per Tube Daily  . atorvastatin  80 mg Oral q1800  . bisacodyl  10 mg Oral Daily   Or  . bisacodyl  10 mg Rectal Daily  . Chlorhexidine Gluconate Cloth  6 each Topical Daily  . docusate sodium  200 mg Oral Daily  . furosemide  40 mg Intravenous BID  . insulin aspart  0-24 Units Subcutaneous Q4H  . mouth  rinse  15 mL Mouth Rinse BID  . pantoprazole  40 mg Oral Daily  . sodium chloride flush  3 mL Intravenous Q12H   Continuous Infusions: . sodium chloride Stopped (09/29/16 1100)  . sodium chloride    . sodium chloride Stopped (09/29/16 1100)  . amiodarone    . amiodarone    . amiodarone    . diltiazem (CARDIZEM) infusion 10 mg/hr (10/01/16 0700)  . lactated ringers    . lactated ringers Stopped (09/30/16 0400)  . milrinone 0.375 mcg/kg/min (10/01/16 0700)  . potassium chloride 10 mEq (10/01/16 0703)   PRN Meds:.sodium chloride, levalbuterol, metoprolol tartrate, midazolam, ondansetron (ZOFRAN) IV, oxyCODONE, promethazine, sodium chloride flush, sodium chloride flush, traMADol  Neurologic: intact Heart: IRRR IRRR Lungs: Diminshed basilar breath sounds Abdomen: Soft, non tender, sporadic bowel sounds present Extremities: Bilateral LE edema Wound: RLE dressing partially removed and wound is clean and dry;Aquacel sternal dressing is intact  Lab Results: CBC: Recent Labs  09/30/16 0410 09/30/16 1127 10/01/16 0226  WBC 11.7*  --  14.2*  HGB 10.0* 9.5* 9.6*  HCT 30.1* 28.0* 28.1*  PLT 115*  --  138*   BMET:  Recent Labs  09/30/16  0410 09/30/16 1127 10/01/16 0226  NA 130* 130* 132*  K 3.8 4.0 3.7  CL 100* 96* 97*  CO2 23  --  27  GLUCOSE 112* 139* 169*  BUN 12 13 14   CREATININE 1.13 1.10 1.38*  CALCIUM 7.7*  --  7.9*    CMET: Lab Results  Component Value Date   WBC 14.2 (H) 10/01/2016   HGB 9.6 (L) 10/01/2016   HCT 28.1 (L) 10/01/2016   PLT 138 (L) 10/01/2016   GLUCOSE 169 (H) 10/01/2016   CHOL 116 09/26/2016   TRIG 103 09/26/2016   HDL 34 (L) 09/26/2016   LDLCALC 61 09/26/2016   ALT 26 10/01/2016   AST 29 10/01/2016   NA 132 (L) 10/01/2016   K 3.7 10/01/2016   CL 97 (L) 10/01/2016   CREATININE 1.38 (H) 10/01/2016   BUN 14 10/01/2016   CO2 27 10/01/2016   TSH 2.023 09/26/2016   INR 1.41 09/28/2016   HGBA1C 5.7 (H) 09/29/2016      PT/INR:  Recent  Labs  09/28/16 1449  LABPROT 17.3*  INR 1.41   Radiology: Dg Chest Port 1 View  Result Date: 10/01/2016 CLINICAL DATA:  Shortness of breath and weakness EXAM: PORTABLE CHEST 1 VIEW COMPARISON:  09/30/2016 FINDINGS: Cardiac shadow remains enlarged. Postsurgical changes are again seen. Right-sided PICC line is stable. Bilateral pleural effusions and likely underlying atelectasis are present but stable. No new focal abnormality is seen. IMPRESSION: Stable appearance of the chest. Electronically Signed   By: Inez Catalina M.D.   On: 10/01/2016 07:33   Dg Chest Port 1 View  Result Date: 09/30/2016 CLINICAL DATA:  Dyspnea EXAM: PORTABLE CHEST 1 VIEW COMPARISON:  09/30/2016 FINDINGS: Status post median sternotomy with stable cardiomegaly. Aortic atherosclerosis. Small bilateral pleural effusions with bibasilar atelectasis, similar in appearance to previous comparison. Low lung volumes are again noted. No acute nor suspicious osseous abnormalities. Osteoarthritis of the glenohumeral joints. Right-sided PICC line tip noted near the cavoatrial juncture. IMPRESSION: 1. Low lung volumes without bibasilar atelectasis and probable small bilateral pleural effusions. 2. Stable cardiomegaly with aortic atherosclerosis. 3. PICC line tip near the cavoatrial juncture. Electronically Signed   By: Ashley Royalty M.D.   On: 09/30/2016 22:05     Assessment/Plan: S/P Procedure(s) (LRB): CORONARY ARTERY BYPASS GRAFTING (CABG) x four, using left internal mammary artery and right leg greater saphenous vein harvested endoscopically (N/A) TRANSESOPHAGEAL ECHOCARDIOGRAM (TEE) (N/A)  1.CV-A fib with HR in the 110's this am.. Cardizem drip stopped and now on Amiodarone drip. Also, on Milrinone drip and co ox 50.5 this am. Dopamine drip to be started this am. Await echo results. 2. Pulmonary-On a non re breather this am. CXR this am shows no pneumothorax, bilateral pleural effusions, cardiomegaly.  3. Volume overload-weight today  is up at least 8 pounds from pre op weight. On Lasix 40 mg IV bid. 4. ABL anemia- H and H this am stable at 9.6 and 28.1 5. Thrombocytopenia-platelets up to 138,000. 6. Supplement potassium 7. Creatinine slightly increased to 1.38. Monitor 8.GI-regarding nausea, continue Phenergan PRN and Reglan. Monitor   Nani Skillern PA-C 10/01/2016 7:47 AM   Echocardiogram today shows no sig pericardial effusion with normal biventricular function- images personally reviewed. afib  alternates w/ nsr  Acute respiratory failure req BIPAP- appreciate CCM input. Poss aspiration , GERD Zosyn started  Cut back inoropes- pt with LVH needs volume  patient examined and medical record reviewed,agree with above note. Tharon Aquas Trigt III 10/01/2016

## 2016-10-01 NOTE — Progress Notes (Signed)
Patient ID: Ernest Booker, male   DOB: 01/13/1946, 71 y.o.   MRN: [id number]  SICU Evening Rounds:  Hemodynamically stable on milrinone 0.25. Was on dopamine but stopped today.  Has looked better on bipap today according to nurse. sats 100% now.  On IV amio for atrial fib but now back in sinus 76.   Urine output marginal 30-40/hr. Did not diurese with lasix this am and creat up this afternoon.  CBC    Component Value Date/Time   WBC 14.2 (H) 10/01/2016 0226   RBC 3.17 (L) 10/01/2016 0226   HGB 9.5 (L) 10/01/2016 1539   HCT 28.0 (L) 10/01/2016 1539   PLT 138 (L) 10/01/2016 0226   MCV 88.6 10/01/2016 0226   MCH 30.3 10/01/2016 0226   MCHC 34.2 10/01/2016 0226   RDW 14.0 10/01/2016 0226   BMET    Component Value Date/Time   NA 131 (L) 10/01/2016 1539   K 4.1 10/01/2016 1539   CL 94 (L) 10/01/2016 1539   CO2 27 10/01/2016 0226   GLUCOSE 150 (H) 10/01/2016 1539   BUN 22 (H) 10/01/2016 1539   CREATININE 1.60 (H) 10/01/2016 1539   CALCIUM 7.9 (L) 10/01/2016 0226   GFRNONAA 51 (L) 10/01/2016 0226   GFRAA 59 (L) 10/01/2016 2694

## 2016-10-02 ENCOUNTER — Inpatient Hospital Stay (HOSPITAL_COMMUNITY): Payer: BLUE CROSS/BLUE SHIELD

## 2016-10-02 DIAGNOSIS — J90 Pleural effusion, not elsewhere classified: Secondary | ICD-10-CM

## 2016-10-02 LAB — POCT I-STAT, CHEM 8
BUN: 28 mg/dL — AB (ref 6–20)
CREATININE: 1.7 mg/dL — AB (ref 0.61–1.24)
Calcium, Ion: 1.15 mmol/L (ref 1.15–1.40)
Chloride: 95 mmol/L — ABNORMAL LOW (ref 101–111)
GLUCOSE: 155 mg/dL — AB (ref 65–99)
HCT: 24 % — ABNORMAL LOW (ref 39.0–52.0)
HEMOGLOBIN: 8.2 g/dL — AB (ref 13.0–17.0)
Potassium: 3.8 mmol/L (ref 3.5–5.1)
Sodium: 132 mmol/L — ABNORMAL LOW (ref 135–145)
TCO2: 26 mmol/L (ref 0–100)

## 2016-10-02 LAB — COMPREHENSIVE METABOLIC PANEL
ALT: 21 U/L (ref 17–63)
AST: 22 U/L (ref 15–41)
Albumin: 3.1 g/dL — ABNORMAL LOW (ref 3.5–5.0)
Alkaline Phosphatase: 34 U/L — ABNORMAL LOW (ref 38–126)
Anion gap: 11 (ref 5–15)
BUN: 24 mg/dL — ABNORMAL HIGH (ref 6–20)
CO2: 25 mmol/L (ref 22–32)
Calcium: 8.2 mg/dL — ABNORMAL LOW (ref 8.9–10.3)
Chloride: 94 mmol/L — ABNORMAL LOW (ref 101–111)
Creatinine, Ser: 1.71 mg/dL — ABNORMAL HIGH (ref 0.61–1.24)
GFR calc Af Amer: 46 mL/min — ABNORMAL LOW (ref >60)
GFR calc non Af Amer: 39 mL/min — ABNORMAL LOW (ref >60)
Glucose, Bld: 170 mg/dL — ABNORMAL HIGH (ref 65–99)
Potassium: 4.2 mmol/L (ref 3.5–5.1)
Sodium: 130 mmol/L — ABNORMAL LOW (ref 135–145)
Total Bilirubin: 0.9 mg/dL (ref 0.3–1.2)
Total Protein: 5.3 g/dL — ABNORMAL LOW (ref 6.5–8.1)

## 2016-10-02 LAB — COOXEMETRY PANEL
Carboxyhemoglobin: 1.4 % (ref 0.5–1.5)
Methemoglobin: 1 % (ref 0.0–1.5)
O2 Saturation: 73.8 %
Total hemoglobin: 7.3 g/dL — ABNORMAL LOW (ref 12.0–16.0)

## 2016-10-02 LAB — GLUCOSE, CAPILLARY
GLUCOSE-CAPILLARY: 119 mg/dL — AB (ref 65–99)
GLUCOSE-CAPILLARY: 154 mg/dL — AB (ref 65–99)
Glucose-Capillary: 125 mg/dL — ABNORMAL HIGH (ref 65–99)
Glucose-Capillary: 136 mg/dL — ABNORMAL HIGH (ref 65–99)
Glucose-Capillary: 140 mg/dL — ABNORMAL HIGH (ref 65–99)
Glucose-Capillary: 149 mg/dL — ABNORMAL HIGH (ref 65–99)
Glucose-Capillary: 154 mg/dL — ABNORMAL HIGH (ref 65–99)

## 2016-10-02 LAB — CBC
HCT: 25.2 % — ABNORMAL LOW (ref 39.0–52.0)
Hemoglobin: 8.5 g/dL — ABNORMAL LOW (ref 13.0–17.0)
MCH: 29.8 pg (ref 26.0–34.0)
MCHC: 33.7 g/dL (ref 30.0–36.0)
MCV: 88.4 fL (ref 78.0–100.0)
PLATELETS: 164 10*3/uL (ref 150–400)
RBC: 2.85 MIL/uL — ABNORMAL LOW (ref 4.22–5.81)
RDW: 14.3 % (ref 11.5–15.5)
WBC: 11.6 10*3/uL — ABNORMAL HIGH (ref 4.0–10.5)

## 2016-10-02 LAB — BLOOD GAS, ARTERIAL
Acid-Base Excess: 1.8 mmol/L (ref 0.0–2.0)
Bicarbonate: 25.3 mmol/L (ref 20.0–28.0)
Delivery systems: POSITIVE
Drawn by: 437071
Expiratory PAP: 8
FIO2: 60
Inspiratory PAP: 12
O2 Saturation: 99 %
Patient temperature: 99
pCO2 arterial: 36.1 mmHg (ref 32.0–48.0)
pH, Arterial: 7.46 — ABNORMAL HIGH (ref 7.350–7.450)
pO2, Arterial: 137 mmHg — ABNORMAL HIGH (ref 83.0–108.0)

## 2016-10-02 LAB — PROCALCITONIN: PROCALCITONIN: 1.22 ng/mL

## 2016-10-02 MED ORDER — POTASSIUM CHLORIDE 10 MEQ/50ML IV SOLN
INTRAVENOUS | Status: AC
  Administered 2016-10-02: 10 meq via INTRAVENOUS
  Filled 2016-10-02: qty 50

## 2016-10-02 MED ORDER — POTASSIUM CHLORIDE 10 MEQ/50ML IV SOLN
10.0000 meq | INTRAVENOUS | Status: AC
  Administered 2016-10-02 (×4): 10 meq via INTRAVENOUS
  Filled 2016-10-02 (×3): qty 50

## 2016-10-02 MED ORDER — ALBUMIN HUMAN 25 % IV SOLN
12.5000 g | Freq: Four times a day (QID) | INTRAVENOUS | Status: AC
  Administered 2016-10-02 – 2016-10-03 (×4): 12.5 g via INTRAVENOUS
  Filled 2016-10-02 (×3): qty 50

## 2016-10-02 MED ORDER — METHYLPREDNISOLONE SODIUM SUCC 40 MG IJ SOLR
40.0000 mg | Freq: Two times a day (BID) | INTRAMUSCULAR | Status: AC
  Administered 2016-10-02 – 2016-10-03 (×3): 40 mg via INTRAVENOUS
  Filled 2016-10-02 (×3): qty 1

## 2016-10-02 MED ORDER — AMIODARONE IV BOLUS ONLY 150 MG/100ML
150.0000 mg | Freq: Once | INTRAVENOUS | Status: AC
  Administered 2016-10-02: 150 mg via INTRAVENOUS
  Filled 2016-10-02: qty 100

## 2016-10-02 NOTE — Progress Notes (Signed)
10/02/2016 1530 Dr. Prescott Gum paged and made aware of pt. Continued in and out of Afib rate 90-110 and NSR 70-80. Pt. Asymptomatic with Afib, VSS. Orders received to check Chem 8 now and replace KCL if necessary per protocol. Orders enacted. Will continue to closely monitor patient.  Kenitha Glendinning, Arville Lime

## 2016-10-02 NOTE — Progress Notes (Signed)
Patient transported to CT Scan on Bipap and returned to unit without any complications.

## 2016-10-02 NOTE — Progress Notes (Signed)
4 Days Post-Op Procedure(s) (LRB): CORONARY ARTERY BYPASS GRAFTING (CABG) x four, using left internal mammary artery and right leg greater saphenous vein harvested endoscopically (N/A) TRANSESOPHAGEAL ECHOCARDIOGRAM (TEE) (N/A) Subjective: Acute respiratory failure after CABG- aspiration Much better on BiPaP Echo= dynamic LV, RV, co-ox 70% Now nsr CXR mild edema Temp 101.4 yesterday pm, now normal prob GERD- aspiration Cont antibiotics and mobilize OOB w/ BIPaP as needed  Objective: Vital signs in last 24 hours: Temp:  [97.2 F (36.2 C)-101.1 F (38.4 C)] 98.5 F (36.9 C) (06/15 0743) Pulse Rate:  [63-138] 73 (06/15 0743) Cardiac Rhythm: Normal sinus rhythm (06/14 2000) Resp:  [12-32] 21 (06/15 0743) BP: (91-140)/(51-75) 112/75 (06/15 0743) SpO2:  [87 %-100 %] 100 % (06/15 0743) Arterial Line BP: (94-161)/(44-73) 161/57 (06/15 0600) FiO2 (%):  [50 %-100 %] 50 % (06/15 0743) Weight:  [213 lb 13.5 oz (97 kg)] 213 lb 13.5 oz (97 kg) (06/15 0500)  Hemodynamic parameters for last 24 hours:    Intake/Output from previous day: 06/14 0701 - 06/15 0700 In: 1400 [P.O.:320; I.V.:680; IV Piggyback:400] Out: 910 [Urine:910] Intake/Output this shift: No intake/output data recorded.       Exam    General- alert and comfortable   Lungs-diminished at R basewithout rales, wheezes   Cor- regular rate and rhythm, no murmur , gallop   Abdomen- soft, non-tender   Extremities - warm, non-tender, minimal edema   Neuro- oriented, appropriate, no focal weakness   Lab Results:  Recent Labs  10/01/16 0226  10/01/16 1539 10/02/16 0304  WBC 14.2*  --   --  11.6*  HGB 9.6*  < > 9.5* 8.5*  HCT 28.1*  < > 28.0* 25.2*  PLT 138*  --   --  164  < > = values in this interval not displayed. BMET:  Recent Labs  10/01/16 0226  10/01/16 1539 10/02/16 0304  NA 132*  < > 131* 130*  K 3.7  < > 4.1 4.2  CL 97*  < > 94* 94*  CO2 27  --   --  25  GLUCOSE 169*  < > 150* 170*  BUN 14  < > 22*  24*  CREATININE 1.38*  < > 1.60* 1.71*  CALCIUM 7.9*  --   --  8.2*  < > = values in this interval not displayed.  PT/INR: No results for input(s): LABPROT, INR in the last 72 hours. ABG    Component Value Date/Time   PHART 7.460 (H) 10/02/2016 0227   HCO3 25.3 10/02/2016 0227   TCO2 23 10/01/2016 1539   ACIDBASEDEF 4.0 (H) 09/28/2016 2025   O2SAT 73.8 10/02/2016 0315   CBG (last 3)   Recent Labs  10/01/16 2045 10/02/16 0017 10/02/16 0345  GLUCAP 135* 140* 119*    Assessment/Plan: S/P Procedure(s) (LRB): CORONARY ARTERY BYPASS GRAFTING (CABG) x four, using left internal mammary artery and right leg greater saphenous vein harvested endoscopically (N/A) TRANSESOPHAGEAL ECHOCARDIOGRAM (TEE) (N/A) Mobilize Diuresis cont iv amiodarone   LOS: 8 days    Ernest Booker 10/02/2016

## 2016-10-02 NOTE — Progress Notes (Signed)
10/02/2016 1015 RN noted pt. Back in Afib rate 90-115. Pt. Asymptomatic, VSS. Dr. Prescott Gum paged and made aware. Verbal order received to administer 150 mg Amiodarone bolus x 1. Orders enacted. Will continue to closely monitor patient.  Ernest Booker, Ernest Booker

## 2016-10-02 NOTE — Evaluation (Signed)
Physical Therapy Evaluation Patient Details Name: Ernest Booker MRN: [id number] DOB: March 07, 1946 Today's Date: 10/02/2016   History of Present Illness  Pt adm with NSTEMI and underwent CABG x 4 on 6/11. Pt developed respiratory failure on 6/14 and required bipap. PMH - HTN  Clinical Impression  Pt admitted with above diagnosis and presents to PT with functional limitations due to deficits listed below (See PT problem list). Pt needs skilled PT to maximize independence and safety to allow discharge to home with wife. Pt's mobility currently primarily limited by his respiratory status. Expect good progress if respiratory issues resolve.     Follow Up Recommendations Home health PT;Supervision/Assistance - 24 hour    Equipment Recommendations  Other (comment) (To be assessed)    Recommendations for Other Services       Precautions / Restrictions Precautions Precautions: Sternal;Fall Precaution Comments: watch SpO2      Mobility  Bed Mobility Overal bed mobility: Needs Assistance Bed Mobility: Supine to Sit     Supine to sit: +2 for physical assistance;Min assist     General bed mobility comments: Assist to elevate trunk into sitting and bring hips to EOB  Transfers Overall transfer level: Needs assistance Equipment used: Rolling walker (2 wheeled) Transfers: Sit to/from [employer] Transfers Sit to Stand: +2 physical assistance;Min assist Stand pivot transfers: +2 physical assistance;Min assist       General transfer comment: Verbal cues for technique and to follow sternal precautions. Assist to bring hips up and for stability when taking pivotal steps with walker from bed to chair. Pt on 10L of O2 with ventimask. SpO2 >94%  Ambulation/Gait                Stairs            Wheelchair Mobility    Modified Rankin (Stroke Patients Only)       Balance Overall balance assessment: Needs assistance Sitting-balance support: No upper extremity  supported;Feet supported Sitting balance-Leahy Scale: Good     Standing balance support: Bilateral upper extremity supported Standing balance-Leahy Scale: Poor Standing balance comment: walker and min guard for static standing                             Pertinent Vitals/Pain Pain Assessment: No/denies pain    Home Living Family/patient expects to be discharged to:: Private residence Living Arrangements: Spouse/significant other Available Help at Discharge: Family;Available 24 hours/day Type of Home: House Home Access: Ramped entrance     Home Layout: One level Home Equipment: None      Prior Function Level of Independence: Independent               Hand Dominance        Extremity/Trunk Assessment   Upper Extremity Assessment Upper Extremity Assessment: Overall WFL for tasks assessed    Lower Extremity Assessment Lower Extremity Assessment: Generalized weakness       Communication   Communication: No difficulties  Cognition Arousal/Alertness: Awake/alert Behavior During Therapy: WFL for tasks assessed/performed Overall Cognitive Status: Within Functional Limits for tasks assessed                                        General Comments      Exercises     Assessment/Plan    PT Assessment Patient needs continued PT services  PT Problem List  Decreased strength;Decreased activity tolerance;Decreased balance;Decreased mobility;Decreased knowledge of precautions;Cardiopulmonary status limiting activity       PT Treatment Interventions DME instruction;Gait training;Functional mobility training;Therapeutic activities;Therapeutic exercise;Balance training;Patient/family education    PT Goals (Current goals can be found in the Care Plan section)  Acute Rehab PT Goals Patient Stated Goal: go home PT Goal Formulation: With patient/family Time For Goal Achievement: 10/09/16 Potential to Achieve Goals: Good    Frequency Min  3X/week   Barriers to discharge        Co-evaluation               AM-PAC PT "6 Clicks" Daily Activity  Outcome Measure Difficulty turning over in bed (including adjusting bedclothes, sheets and blankets)?: Total Difficulty moving from lying on back to sitting on the side of the bed? : Total Difficulty sitting down on and standing up from a chair with arms (e.g., wheelchair, bedside commode, etc,.)?: Total Help needed moving to and from a bed to chair (including a wheelchair)?: A Little Help needed walking in hospital room?: A Lot Help needed climbing 3-5 steps with a railing? : Total 6 Click Score: 9    End of Session Equipment Utilized During Treatment: Oxygen Activity Tolerance: Patient limited by fatigue;Other (comment) Patient left: in chair;with call bell/phone within reach;with chair alarm set;with family/visitor present Nurse Communication: Mobility status PT Visit Diagnosis: Unsteadiness on feet (R26.81);Difficulty in walking, not elsewhere classified (R26.2)    Time: 4496-7591 PT Time Calculation (min) (ACUTE ONLY): 16 min   Charges:   PT Evaluation $PT Eval Moderate Complexity: 1 Procedure     PT G CodesMarland Kitchen        [facility] PT [place] 10/02/2016, 2:23 PM

## 2016-10-02 NOTE — Progress Notes (Signed)
10/02/2016 1800  Verbal order Dr. Roxan Hockey administer 4 Runs IV KCL. Orders enacted. Will continue to closely monitor patient. Angelita Harnack, Arville Lime

## 2016-10-02 NOTE — Progress Notes (Signed)
PULMONARY / CRITICAL CARE MEDICINE   Name: Ernest Booker MRN: [id number] DOB: Jun 10, 1945    ADMISSION DATE:  09/24/2016 CONSULTATION DATE:  6/14  REFERRING MD:  Prescott Gum   CHIEF COMPLAINT:  Acute respiratory failure   HISTORY OF PRESENT ILLNESS:   This is a 71 year old male w/ h/o HL and HTN, who was transferred from [facility] on 6/7 after being admitted for chest pain. Underwent cardiac cath which showed multivessel disease and was referred here for CABG. He underwent 4V CABG on 6/11.  He was extubated on 6/12. On 6/13 he was noted to be feeling poorly and had increased audible wheezing w/ ambulation. Also has had intermittent nausea and vomited the am of 6/14. Was felt volume overloaded and treated w/ additional diuresis. Later that evening again noted to have increased wheezing (w/out stethoscope) and desaturations requiring 6 liters to get sats to 89%. Treated again this time w/ lasix and nebs but team concerned about stridor. On 6/14 RR worse, desaturated to high 80s on NRB mask. Placed on NIPPV for loud upper airway noises. PCCM asked to assess.   SUBJECTIVE:  Breathing better this AM. Remains on BiPAP  VITAL SIGNS: BP 112/75   Pulse 73   Temp 98.5 F (36.9 C)   Resp (!) 21   Ht 5\' 6"  (1.676 m)   Wt 97 kg (213 lb 13.5 oz)   SpO2 100%   BMI 34.52 kg/m   HEMODYNAMICS:    VENTILATOR SETTINGS: Vent Mode: BIPAP;PCV FiO2 (%):  [50 %-100 %] 50 % Set Rate:  [12 bmp] 12 bmp PEEP:  [8 cmH20] 8 cmH20 Pressure Support:  [4 cmH20] 4 cmH20 Plateau Pressure:  [9 cmH20] 9 cmH20  INTAKE / OUTPUT: I/O last 3 completed shifts: In: 1890.1 [P.O.:470; I.V.:920.1; IV Piggyback:500] Out: 8882 [Urine:1785]  PHYSICAL EXAMINATION:  General:  Adult male in NAD on BiPAP Neuro:  Alert, oriented, non-focal HEENT:  [place]/AT, No JVD noted, PERRL Cardiovascular:  RRR, no MRG Lungs:  Diminished R>L Abdomen:  Soft, non-distended, non-tender Musculoskeletal:  No acute deformity, or ROM  limitation Skin:  Intact, MMM     LABS:  BMET  Recent Labs Lab 09/30/16 0410  10/01/16 0226 10/01/16 0828 10/01/16 1539 10/02/16 0304  NA 130*  < > 132* 130* 131* 130*  K 3.8  < > 3.7 4.2 4.1 4.2  CL 100*  < > 97* 94* 94* 94*  CO2 23  --  27  --   --  25  BUN 12  < > 14 18 22* 24*  CREATININE 1.13  < > 1.38* 1.40* 1.60* 1.71*  GLUCOSE 112*  < > 169* 197* 150* 170*  < > = values in this interval not displayed.  Electrolytes  Recent Labs Lab 09/28/16 2022 09/29/16 0336 09/29/16 1705 09/30/16 0410 10/01/16 0226 10/02/16 0304  CALCIUM  --  7.7*  --  7.7* 7.9* 8.2*  MG 2.6* 2.3 2.2  --   --   --     CBC  Recent Labs Lab 09/30/16 0410  10/01/16 0226 10/01/16 0828 10/01/16 1539 10/02/16 0304  WBC 11.7*  --  14.2*  --   --  11.6*  HGB 10.0*  < > 9.6* 9.2* 9.5* 8.5*  HCT 30.1*  < > 28.1* 27.0* 28.0* 25.2*  PLT 115*  --  138*  --   --  164  < > = values in this interval not displayed.  Coag's  Recent Labs Lab 09/28/16 1449  APTT  29  INR 1.41    Sepsis Markers  Recent Labs Lab 10/01/16 1314 10/02/16 0304  PROCALCITON 0.82 1.22    ABG  Recent Labs Lab 09/30/16 2152 10/01/16 1341 10/02/16 0227  PHART 7.433 7.444 7.460*  PCO2ART 36.1 35.8 36.1  PO2ART 63.0* 64.0* 137*    Liver Enzymes  Recent Labs Lab 10/01/16 0226 10/02/16 0304  AST 29 22  ALT 26 21  ALKPHOS 32* 34*  BILITOT 1.1 0.9  ALBUMIN 2.9* 3.1*    Cardiac Enzymes No results for input(s): TROPONINI, PROBNP in the last 168 hours.  Glucose  Recent Labs Lab 10/01/16 1146 10/01/16 1522 10/01/16 2045 10/02/16 0017 10/02/16 0345 10/02/16 0745  GLUCAP 167* 147* 135* 140* 119* 125*    Imaging Dg Chest Port 1 View  Result Date: 10/02/2016 CLINICAL DATA:  Shortness of Breath EXAM: PORTABLE CHEST 1 VIEW COMPARISON:  10/01/2016 FINDINGS: Cardiac shadow is enlarged but stable. Postsurgical changes are again noted. Right-sided PICC line is seen in satisfactory position.  Bilateral pleural effusions are again identified and stable. Underlying atelectasis is noted. IMPRESSION: Stable bilateral pleural effusions with basilar atelectasis. Electronically Signed   By: Inez Catalina M.D.   On: 10/02/2016 07:55   Dg Abd Portable 1v  Result Date: 10/01/2016 CLINICAL DATA:  Ileus EXAM: PORTABLE ABDOMEN - 1 VIEW COMPARISON:  Abdominopelvic CT scan of June 12, 2015 FINDINGS: There is a relative paucity of bowel gas. There is no evidence of obstruction. There is some gas in the stomach, descending colon, and rectum. There are no abnormal soft tissue calcifications. The bony structures exhibit no acute abnormalities. IMPRESSION: No significant gaseous distention of bowel. No definite fluid-filled bowel loops are observed either. Further evaluation with abdominopelvic CT scan may be useful if the patient's symptoms or laboratory values warrant further evaluation. Electronically Signed   By: David  Martinique M.D.   On: 10/01/2016 13:05     STUDIES:  ECHO 6/14> LVEF 65-70%, small effusion  CULTURES:   ANTIBIOTICS: Zosyn 6/14>>>  SIGNIFICANT EVENTS:   LINES/TUBES:   DISCUSSION: 71 year old male s/p 4 V CABG 6/11. PCCM asked to see 6/14 for intermittent hypoxia and presumed laryngospasm. He was started on BiPAP with some improvement with plans for continued diuresis and treatment of possible PNA and was moved to ICU for closer monitoring. 6/15 he was able to come off BiPAP for some long stretches, but still requiring it PRN.   ASSESSMENT / PLAN:  Acute hypoxic respiratory failure in setting of what seems to be transient Laryngospasm +/-bronchospasm,  superimposed on element of pulmonary edema/volume overload. Can't exclude aspiration event as he has had some vomiting. Audible wheeze resolved with NIMV. No clear ileus on KUB. If no improvement may need to consider interstitial process such as amiodarone lung tox.   Plan Cont NIPPV PRN, if needed this continuously will  need to consider intubation. Continue lasix BID as BUN/cr allows Empiric zosyn (day 2) , trend PCT. Plan 5 day course PPI BID  Mobilize Consider CT chest if no improvement today. He is dry on echo with hyperdynamic LV and mild creatinine bump.   CAD now s/p 4V CABG No CHF based on echo. AF w/ RVR Plan:  Cont telemetry  Management per primary (currently on milrinone and amiodarone) Diuresing  Mild AKI Hyponatremia Plan Strict intake/output Follow BMP Correct electrolytes as indicated  GERD, gastric distention, N&V. No ileus on KUB 6/14 Plan Bowel rest PPI max Scheduled reglan Zofran PRN  Acute blood loss anemia Plan  Trend cbc Transfuse per protocol   Hyperglycemia Plan SSI  Georgann Housekeeper, AGACNP-BC [facility] Pulmonology/Critical Care Pager [phone] or [phone]  10/02/2016 8:49 AM  Attending Note:  71 year old male s/p open heart surgery who presents to PCCM with hypoxemic respiratory failure likely a combination of pulmonary edema and aspiration.  On exam, bibasilar crackles and decreased BS.  I reviewed CXR myself, pulmonary edema and pleural effusion.  Will check a CT to evaluate pulmonary parenchyma today and extent of pleural effusion.  Highly doubtful that pleural effusion is a factor in hypoxemic respiratory failure since it usually causes a matched defect.  Change BiPAP to PRN and titrate O2 for sat of 88-92%.  PCCM will continue to follow.  The patient is critically ill with multiple organ systems failure and requires high complexity decision making for assessment and support, frequent evaluation and titration of therapies, application of advanced monitoring technologies and extensive interpretation of multiple databases.   Critical Care Time devoted to patient care services described in this note is  35  Minutes. This time reflects time of care of this signee Dr [name]. This critical care time does not reflect procedure time, or teaching time or  supervisory time of PA/NP/Med student/Med Resident etc but could involve care discussion time.  Rush Farmer, M.D. [facility] Pulmonary/Critical Care Medicine. Pager: [phone]. After hours pager: [phone].

## 2016-10-02 NOTE — Progress Notes (Signed)
      [address]       [place] [address]             [phone]      Currently in atrial fib HR 90-110 on amiodarone drip  BP 123/75   Pulse (!) 109   Temp 98.1 F (36.7 C) (Oral)   Resp (!) 23   Ht 5\' 6"  (1.676 m)   Wt 213 lb 13.5 oz (97 kg)   SpO2 98%   BMI 34.52 kg/m    Intake/Output Summary (Last 24 hours) at 10/02/16 1754 Last data filed at 10/02/16 1700  Gross per 24 hour  Intake          1383.68 ml  Output             1885 ml  Net          -501.32 ml   K= 3.8, creatinine stable at 1.7  Remo Lipps C. Roxan Hockey, MD [facility] [phone]

## 2016-10-03 ENCOUNTER — Inpatient Hospital Stay (HOSPITAL_COMMUNITY): Payer: BLUE CROSS/BLUE SHIELD

## 2016-10-03 LAB — POCT I-STAT, CHEM 8
BUN: 39 mg/dL — ABNORMAL HIGH (ref 6–20)
CALCIUM ION: 1.1 mmol/L — AB (ref 1.15–1.40)
CHLORIDE: 97 mmol/L — AB (ref 101–111)
Creatinine, Ser: 1.6 mg/dL — ABNORMAL HIGH (ref 0.61–1.24)
GLUCOSE: 144 mg/dL — AB (ref 65–99)
HCT: 24 % — ABNORMAL LOW (ref 39.0–52.0)
HEMOGLOBIN: 8.2 g/dL — AB (ref 13.0–17.0)
Potassium: 3.6 mmol/L (ref 3.5–5.1)
SODIUM: 135 mmol/L (ref 135–145)
TCO2: 26 mmol/L (ref 0–100)

## 2016-10-03 LAB — CBC
HCT: 24.7 % — ABNORMAL LOW (ref 39.0–52.0)
Hemoglobin: 8.3 g/dL — ABNORMAL LOW (ref 13.0–17.0)
MCH: 29.6 pg (ref 26.0–34.0)
MCHC: 33.6 g/dL (ref 30.0–36.0)
MCV: 88.2 fL (ref 78.0–100.0)
Platelets: 183 10*3/uL (ref 150–400)
RBC: 2.8 MIL/uL — ABNORMAL LOW (ref 4.22–5.81)
RDW: 14.7 % (ref 11.5–15.5)
WBC: 11.5 10*3/uL — ABNORMAL HIGH (ref 4.0–10.5)

## 2016-10-03 LAB — COMPREHENSIVE METABOLIC PANEL
ALT: 23 U/L (ref 17–63)
AST: 28 U/L (ref 15–41)
Albumin: 3.5 g/dL (ref 3.5–5.0)
Alkaline Phosphatase: 37 U/L — ABNORMAL LOW (ref 38–126)
Anion gap: 10 (ref 5–15)
BUN: 32 mg/dL — ABNORMAL HIGH (ref 6–20)
CO2: 26 mmol/L (ref 22–32)
Calcium: 8.5 mg/dL — ABNORMAL LOW (ref 8.9–10.3)
Chloride: 97 mmol/L — ABNORMAL LOW (ref 101–111)
Creatinine, Ser: 1.63 mg/dL — ABNORMAL HIGH (ref 0.61–1.24)
GFR calc Af Amer: 48 mL/min — ABNORMAL LOW (ref >60)
GFR calc non Af Amer: 42 mL/min — ABNORMAL LOW (ref >60)
Glucose, Bld: 145 mg/dL — ABNORMAL HIGH (ref 65–99)
Potassium: 3.8 mmol/L (ref 3.5–5.1)
Sodium: 133 mmol/L — ABNORMAL LOW (ref 135–145)
Total Bilirubin: 1.5 mg/dL — ABNORMAL HIGH (ref 0.3–1.2)
Total Protein: 6.2 g/dL — ABNORMAL LOW (ref 6.5–8.1)

## 2016-10-03 LAB — BLOOD GAS, ARTERIAL
Acid-Base Excess: 3.5 mmol/L — ABNORMAL HIGH (ref 0.0–2.0)
Bicarbonate: 27.3 mmol/L (ref 20.0–28.0)
Delivery systems: POSITIVE
Expiratory PAP: 8
FIO2: 50
Inspiratory PAP: 12
Mode: POSITIVE
O2 Saturation: 92.3 %
Patient temperature: 98.6
RATE: 12 resp/min
pCO2 arterial: 40.1 mmHg (ref 32.0–48.0)
pH, Arterial: 7.447 (ref 7.350–7.450)
pO2, Arterial: 66.9 mmHg — ABNORMAL LOW (ref 83.0–108.0)

## 2016-10-03 LAB — GLUCOSE, CAPILLARY
GLUCOSE-CAPILLARY: 155 mg/dL — AB (ref 65–99)
GLUCOSE-CAPILLARY: 138 mg/dL — AB (ref 65–99)
GLUCOSE-CAPILLARY: 134 mg/dL — AB (ref 65–99)
Glucose-Capillary: 150 mg/dL — ABNORMAL HIGH (ref 65–99)
Glucose-Capillary: 142 mg/dL — ABNORMAL HIGH (ref 65–99)

## 2016-10-03 LAB — PROCALCITONIN: PROCALCITONIN: 0.87 ng/mL

## 2016-10-03 MED ORDER — METOPROLOL TARTRATE 12.5 MG HALF TABLET
12.5000 mg | ORAL_TABLET | Freq: Two times a day (BID) | ORAL | Status: DC
  Administered 2016-10-03 – 2016-10-06 (×7): 12.5 mg via ORAL
  Filled 2016-10-03 (×7): qty 1

## 2016-10-03 MED ORDER — INSULIN ASPART 100 UNIT/ML ~~LOC~~ SOLN
0.0000 [IU] | Freq: Three times a day (TID) | SUBCUTANEOUS | Status: DC
Start: 2016-10-03 — End: 2016-10-07
  Administered 2016-10-03 – 2016-10-04 (×3): 2 [IU] via SUBCUTANEOUS
  Administered 2016-10-04: 3 [IU] via SUBCUTANEOUS
  Administered 2016-10-05: 2 [IU] via SUBCUTANEOUS

## 2016-10-03 MED ORDER — INSULIN ASPART 100 UNIT/ML ~~LOC~~ SOLN: 0.0000 [IU] | Freq: Every day | SUBCUTANEOUS | Status: DC

## 2016-10-03 MED ORDER — POTASSIUM CHLORIDE CRYS ER 20 MEQ PO TBCR
20.0000 meq | EXTENDED_RELEASE_TABLET | Freq: Two times a day (BID) | ORAL | Status: AC
  Administered 2016-10-03 (×2): 20 meq via ORAL
  Filled 2016-10-03 (×2): qty 1

## 2016-10-03 MED ORDER — PANTOPRAZOLE SODIUM 40 MG PO TBEC
40.0000 mg | DELAYED_RELEASE_TABLET | Freq: Two times a day (BID) | ORAL | Status: DC
  Administered 2016-10-03 – 2016-10-09 (×14): 40 mg via ORAL
  Filled 2016-10-03 (×15): qty 1

## 2016-10-03 MED ORDER — POTASSIUM CHLORIDE 10 MEQ/50ML IV SOLN
10.0000 meq | INTRAVENOUS | Status: AC | PRN
  Administered 2016-10-03 (×3): 10 meq via INTRAVENOUS
  Filled 2016-10-03 (×3): qty 50

## 2016-10-03 MED ORDER — ZOLPIDEM TARTRATE 5 MG PO TABS
5.0000 mg | ORAL_TABLET | Freq: Every evening | ORAL | Status: DC | PRN
  Administered 2016-10-03 – 2016-10-06 (×2): 5 mg via ORAL
  Filled 2016-10-03 (×2): qty 1

## 2016-10-03 MED ORDER — GUAIFENESIN ER 600 MG PO TB12
1200.0000 mg | ORAL_TABLET | Freq: Two times a day (BID) | ORAL | Status: DC
  Administered 2016-10-03 – 2016-10-09 (×14): 1200 mg via ORAL
  Filled 2016-10-03 (×15): qty 2

## 2016-10-03 NOTE — Plan of Care (Signed)
Problem: Activity: Goal: Risk for activity intolerance will decrease Outcome: Progressing Pt ambulated in hallways three times today without difficulty.  Problem: Respiratory: Goal: Respiratory status will improve Outcome: Progressing Pt was able to be off BiPAP all day today.  Problem: Pain Management: Goal: Pain level will decrease Outcome: Progressing Pts pain is adequately controlled with pain medication ordered.  Problem: Urinary Elimination: Goal: Ability to achieve and maintain adequate renal perfusion and functioning will improve Outcome: Progressing Pt is making adequate amounts of urine.

## 2016-10-03 NOTE — Progress Notes (Signed)
5 Days Post-Op Procedure(s) (LRB): CORONARY ARTERY BYPASS GRAFTING (CABG) x four, using left internal mammary artery and right leg greater saphenous vein harvested endoscopically (N/A) TRANSESOPHAGEAL ECHOCARDIOGRAM (TEE) (N/A) Subjective: Ambulated this AM. No pain but still feels SOB  Objective: Vital signs in last 24 hours: Temp:  [97.8 F (36.6 C)-98.3 F (36.8 C)] 98 F (36.7 C) (06/16 0756) Pulse Rate:  [72-120] 110 (06/16 0700) Cardiac Rhythm: Atrial fibrillation (06/16 0400) Resp:  [5-35] 19 (06/16 0700) BP: (105-147)/(66-93) 147/83 (06/16 0700) SpO2:  [90 %-99 %] 95 % (06/16 0700) Arterial Line BP: (112-205)/(40-94) 184/94 (06/16 0700) FiO2 (%):  [50 %] 50 % (06/16 0330) Weight:  [212 lb 15.4 oz (96.6 kg)-214 lb 15.2 oz (97.5 kg)] 212 lb 15.4 oz (96.6 kg) (06/16 0600)  Hemodynamic parameters for last 24 hours:    Intake/Output from previous day: 06/15 0701 - 06/16 0700 In: 1367 [I.V.:867; IV Piggyback:500] Out: 2080 [Urine:2080] Intake/Output this shift: No intake/output data recorded.  General appearance: alert, cooperative and no distress Neurologic: intact Heart: irregularly irregular rhythm Lungs: diminished breath sounds bibasilar and wheezes bilaterally Abdomen: normal findings: soft, non-tender Extremities: edema 2+  Lab Results:  Recent Labs  10/02/16 0304 10/02/16 1545 10/03/16 0340  WBC 11.6*  --  11.5*  HGB 8.5* 8.2* 8.3*  HCT 25.2* 24.0* 24.7*  PLT 164  --  183   BMET:  Recent Labs  10/02/16 0304 10/02/16 1545 10/03/16 0340  NA 130* 132* 133*  K 4.2 3.8 3.8  CL 94* 95* 97*  CO2 25  --  26  GLUCOSE 170* 155* 145*  BUN 24* 28* 32*  CREATININE 1.71* 1.70* 1.63*  CALCIUM 8.2*  --  8.5*    PT/INR: No results for input(s): LABPROT, INR in the last 72 hours. ABG    Component Value Date/Time   PHART 7.447 10/03/2016 0400   HCO3 27.3 10/03/2016 0400   TCO2 26 10/02/2016 1545   ACIDBASEDEF 4.0 (H) 09/28/2016 2025   O2SAT 92.3  10/03/2016 0400   CBG (last 3)   Recent Labs  10/02/16 1933 10/02/16 2327 10/03/16 0330  GLUCAP 154* 136* 150*    Assessment/Plan: S/P Procedure(s) (LRB): CORONARY ARTERY BYPASS GRAFTING (CABG) x four, using left internal mammary artery and right leg greater saphenous vein harvested endoscopically (N/A) TRANSESOPHAGEAL ECHOCARDIOGRAM (TEE) (N/A) -CV- still in atrial fib on amiodarone  Remains on milrinone at 0.25  RESP- ARF after CABG with likely aspiration   Continue pulmonary hygiene, nebs  On zosyn for presumed aspiration pneumonia  RENAL- creatinine a little better this AM eben with diuresis  Continue diuresis, supplement K  ENDO_ CBG OK change to AC/HS  SCD + enoxaparin for DVT prophylaxis  GI- will try clear liquids today as long as respiratory status remains stable    LOS: 9 days    Melrose Nakayama 10/03/2016

## 2016-10-03 NOTE — Progress Notes (Signed)
PULMONARY / CRITICAL CARE MEDICINE   Name: Ernest Booker MRN: [id number] DOB: [birth year]    ADMISSION DATE:  09/24/2016 CONSULTATION DATE:  6/14  REFERRING MD:  Prescott Gum   CHIEF COMPLAINT:  Acute respiratory failure   HISTORY OF PRESENT ILLNESS:   This is a 71 year old male w/ h/o HL and HTN, who was transferred from [facility] on 6/7 after being admitted for chest pain. Underwent cardiac cath which showed multivessel disease and was referred here for CABG. He underwent 4V CABG on 6/11.  He was extubated on 6/12. On 6/13 he was noted to be feeling poorly and had increased audible wheezing w/ ambulation. Also has had intermittent nausea and vomited the am of 6/14. Was felt volume overloaded and treated w/ additional diuresis. Later that evening again noted to have increased wheezing (w/out stethoscope) and desaturations requiring 6 liters to get sats to 89%. Treated again this time w/ lasix and nebs but team concerned about stridor. On 6/14 RR worse, desaturated to high 80s on NRB mask. Placed on NIPPV for loud upper airway noises. PCCM asked to assess.   SUBJECTIVE:  Off BiPap, on HFNC at 10 L, sitting up in chair,stated his breathing is a bit better today  VITAL SIGNS: BP (!) 147/83   Pulse (!) 110   Temp 98 F (36.7 C) (Oral)   Resp 19   Ht 5\' 6"  (1.676 m)   Wt 212 lb 15.4 oz (96.6 kg)   SpO2 95%   BMI 34.37 kg/m   HEMODYNAMICS:    VENTILATOR SETTINGS: Vent Mode: BIPAP;PCV FiO2 (%):  [50 %] 50 % Set Rate:  [12 bmp] 12 bmp PEEP:  [8 cmH20] 8 cmH20  INTAKE / OUTPUT: I/O last 3 completed shifts: In: 2052.2 [P.O.:320; I.V.:1182.2; IV Piggyback:550] Out: 2560 [Urine:2560]  PHYSICAL EXAMINATION:  General:  Adult male in NAD sitting in chair wearing high flow [place] Neuro:  Awake, alert and oriented x 3, MAE x 4 HEENT:  [place]/AT, No JVD noted, PERRL, slight upper airway wheeze Cardiovascular:  S1, S2, a fib per monitor, No RMG noted Lungs:  Diminished R>L, scattered rhonchi  which clear with cough Abdomen:  Firm, distended, non-tender, BS + Musculoskeletal:  No obvious deformity of decreased ROM noted Skin:  Intact, warm and dry, no rash or  Lesions noted     LABS:  BMET  Recent Labs Lab 10/01/16 0226  10/02/16 0304 10/02/16 1545 10/03/16 0340  NA 132*  < > 130* 132* 133*  K 3.7  < > 4.2 3.8 3.8  CL 97*  < > 94* 95* 97*  CO2 27  --  25  --  26  BUN 14  < > 24* 28* 32*  CREATININE 1.38*  < > 1.71* 1.70* 1.63*  GLUCOSE 169*  < > 170* 155* 145*  < > = values in this interval not displayed.  Electrolytes  Recent Labs Lab 09/28/16 2022 09/29/16 0336 09/29/16 1705  10/01/16 0226 10/02/16 0304 10/03/16 0340  CALCIUM  --  7.7*  --   < > 7.9* 8.2* 8.5*  MG 2.6* 2.3 2.2  --   --   --   --   < > = values in this interval not displayed.  CBC  Recent Labs Lab 10/01/16 0226  10/02/16 0304 10/02/16 1545 10/03/16 0340  WBC 14.2*  --  11.6*  --  11.5*  HGB 9.6*  < > 8.5* 8.2* 8.3*  HCT 28.1*  < > 25.2* 24.0* 24.7*  PLT 138*  --  164  --  183  < > = values in this interval not displayed.  Coag's  Recent Labs Lab 09/28/16 1449  APTT 29  INR 1.41    Sepsis Markers  Recent Labs Lab 10/01/16 1314 10/02/16 0304 10/03/16 0340  PROCALCITON 0.82 1.22 0.87    ABG  Recent Labs Lab 10/01/16 1341 10/02/16 0227 10/03/16 0400  PHART 7.444 7.460* 7.447  PCO2ART 35.8 36.1 40.1  PO2ART 64.0* 137* 66.9*    Liver Enzymes  Recent Labs Lab 10/01/16 0226 10/02/16 0304 10/03/16 0340  AST 29 22 28   ALT 26 21 23   ALKPHOS 32* 34* 37*  BILITOT 1.1 0.9 1.5*  ALBUMIN 2.9* 3.1* 3.5    Cardiac Enzymes No results for input(s): TROPONINI, PROBNP in the last 168 hours.  Glucose  Recent Labs Lab 10/02/16 1116 10/02/16 1617 10/02/16 1933 10/02/16 2327 10/03/16 0330 10/03/16 0751  GLUCAP 154* 149* 154* 136* 150* 155*    Imaging Ct Chest Wo Contrast  Result Date: 10/03/2016 CLINICAL DATA:  Acute onset of hypoxia. Follow-up  evaluation requested. EXAM: CT CHEST WITHOUT CONTRAST TECHNIQUE: Multidetector CT imaging of the chest was performed following the standard protocol without IV contrast. COMPARISON:  Chest radiograph performed earlier today at 5:45 a.m., and CT of the chest performed 09/23/2016 FINDINGS: Cardiovascular: The heart is borderline normal in size. Scattered coronary artery calcifications are seen. The patient is status post median sternotomy. Pacer leads are noted tracking along the right side of the heart. The thoracic aorta is grossly unremarkable. The great vessels are unremarkable in appearance, though difficult to fully assess without contrast. Mediastinum/Nodes: Trace pericardial fluid is of somewhat high attenuation. Would correlate clinically for any evidence of pericarditis. No definite mediastinal lymphadenopathy is seen. The thyroid gland is unremarkable. No axillary lymphadenopathy is appreciated. Lungs/Pleura: Small bilateral pleural effusions are noted, right larger than left, with partial consolidation of both lower lung lobes. Underlying pneumonia is a concern. The expanded portions of both lungs are clear. No pneumothorax is seen. No dominant mass is identified. Upper Abdomen: The visualized portions of the liver are grossly unremarkable. Vague hypodensity at the splenic hilum may reflect minimal fat. The visualized portions of the pancreas and adrenal glands are within normal limits. The visualized portions of the gallbladder are grossly unremarkable. Musculoskeletal: No acute osseous abnormalities are identified. The visualized musculature is unremarkable in appearance. IMPRESSION: 1. Small bilateral pleural effusions, right greater than left, with partial consolidation of both lower lung lobes. Underlying pneumonia is a concern. 2. Scattered coronary artery calcifications seen. Electronically Signed   By: Garald Balding M.D.   On: 10/03/2016 01:31   Dg Chest Port 1 View  Result Date:  10/03/2016 CLINICAL DATA:  CABG EXAM: PORTABLE CHEST 1 VIEW COMPARISON:  10/02/2016 FINDINGS: Bibasilar atelectasis and pleural effusion unchanged. No significant pulmonary edema. Postop CABG. Right arm PICC tip in the SVC at the cavoatrial junction unchanged. IMPRESSION: Bibasilar atelectasis and effusions unchanged. Electronically Signed   By: Franchot Gallo M.D.   On: 10/03/2016 07:24     STUDIES:  ECHO 6/14> LVEF 65-70%, small effusion, hyperdynamic LV CT Chest 6/15/>> Small bilateral pleural effusions, right greater than left, with partial consolidation of both lower lung lobes. Underlying pneumonia   CULTURES:   ANTIBIOTICS: Zosyn 6/14>>>   SIGNIFICANT EVENTS:   LINES/TUBES:   DISCUSSION: 71 year old male s/p 4 V CABG 6/11. PCCM asked to see 6/14 for intermittent hypoxia and presumed laryngospasm. He was started on  BiPAP with some improvement with plans for continued diuresis and treatment of possible PNA and was moved to ICU for closer monitoring. 6/15 he was able to come off BiPAP for some long stretches, but still requiring it PRN.   ASSESSMENT / PLAN:  Acute hypoxic respiratory failure in setting of what seems to be transient Laryngospasm +/-bronchospasm,  superimposed on element of pulmonary edema/volume overload. Can't exclude aspiration event as he has had some vomiting. Audible wheeze resolved with NIMV. No clear ileus on KUB. CT Chest negative for interstitial process such as amiodarone lung tox.  BiPAP overnight  Plan Cont NIPPV PRN, if needed this continuously will need to consider intubation. Continue lasix BID as BUN/cr allows Empiric zosyn (day 3) , trend PCT. Plan 5- 7  day course PPI BID  Continue scheduled/ prn  Nebs Titrate/ wean  HFNC for saturations 88-92% Mobilize, walk in hall Aggressive pulmonary toilet,  IS Q 1 while awake.  Sputum culture if able Mucinex as mucolytic  CAD now s/p 4V CABG No CHF based on echo. AF w/ RVR Plan:  Cont  telemetry  Management per primary (currently on milrinone and amiodarone) Diuresing  Mild AKI Hyponatremia Plan Strict intake/output Follow BMP Correct/ replete electrolytes as indicated Avoid nephrotoxic medications/ Maintain renal perfusion  Infection: Leukocytosis Afebrile  Plan: Trend CBC and Fever Curve Sputum for culture if able Pan Culture if clinical S/S    GERD, gastric distention, N&V. No ileus on KUB 6/14 Having BM's Plan Bowel rest PPI max Scheduled reglan Zofran PRN   Acute blood loss anemia No obvious source of bleeding Plan Trend cbc Monitor for bleeding Transfuse per protocol   Hyperglycemia Plan CBG's  SSI   Discussion: Coughing up thick tan to green secretions. Will have nursing send for culture if able to get adequate sample.Contiue breahting treatments and mobilization, aggressive Pulmonary toilet  Magdalen Spatz AGACNP-BC [facility] Pager [phone] or [phone]  10/03/2016 8:08 AM

## 2016-10-03 NOTE — Progress Notes (Signed)
Patient tolerating venturi mask well at this time. BIPAP not placd on at this time, but at bedside if needed. RT will continue to monitor.

## 2016-10-03 NOTE — Progress Notes (Signed)
Patient tolerating venutir mask at 50% well at this time. Plan to place patient on BIPAP for sleeping after 10pm meds. RT will continue to monitor.

## 2016-10-03 NOTE — Progress Notes (Signed)
      [address]       [place] [address]             [phone]      [facility] again this afternoon  BP (!) 146/97 (BP Location: Left Arm)   Pulse (!) 114   Temp 97.7 F (36.5 C) (Oral)   Resp (!) 22   Ht 5\' 6"  (1.676 m)   Wt 212 lb 15.4 oz (96.6 kg)   SpO2 96%   BMI 34.37 kg/m    Intake/Output Summary (Last 24 hours) at 10/03/16 1704 Last data filed at 10/03/16 1600  Gross per 24 hour  Intake           1130.2 ml  Output             1645 ml  Net           -514.8 ml   BP has been elevated- will restart low dose lopressor  Remo Lipps C. Roxan Hockey, MD [facility] [phone]

## 2016-10-04 ENCOUNTER — Inpatient Hospital Stay (HOSPITAL_COMMUNITY): Payer: BLUE CROSS/BLUE SHIELD

## 2016-10-04 LAB — CBC
HCT: 27.1 % — ABNORMAL LOW (ref 39.0–52.0)
Hemoglobin: 9 g/dL — ABNORMAL LOW (ref 13.0–17.0)
MCH: 29.7 pg (ref 26.0–34.0)
MCHC: 33.2 g/dL (ref 30.0–36.0)
MCV: 89.4 fL (ref 78.0–100.0)
PLATELETS: 223 10*3/uL (ref 150–400)
RBC: 3.03 MIL/uL — ABNORMAL LOW (ref 4.22–5.81)
RDW: 15.4 % (ref 11.5–15.5)
WBC: 15.7 10*3/uL — ABNORMAL HIGH (ref 4.0–10.5)

## 2016-10-04 LAB — COOXEMETRY PANEL
Carboxyhemoglobin: 1.4 % (ref 0.5–1.5)
Methemoglobin: 0.9 % (ref 0.0–1.5)
O2 Saturation: 59.7 %
Total hemoglobin: 8.8 g/dL — ABNORMAL LOW (ref 12.0–16.0)

## 2016-10-04 LAB — BASIC METABOLIC PANEL
Anion gap: 10 (ref 5–15)
BUN: 43 mg/dL — ABNORMAL HIGH (ref 6–20)
CALCIUM: 8.6 mg/dL — AB (ref 8.9–10.3)
CO2: 26 mmol/L (ref 22–32)
CREATININE: 1.7 mg/dL — AB (ref 0.61–1.24)
Chloride: 98 mmol/L — ABNORMAL LOW (ref 101–111)
GFR calc Af Amer: 46 mL/min — ABNORMAL LOW (ref >60)
GFR, EST NON AFRICAN AMERICAN: 40 mL/min — AB (ref >60)
Glucose, Bld: 127 mg/dL — ABNORMAL HIGH (ref 65–99)
Potassium: 4 mmol/L (ref 3.5–5.1)
SODIUM: 134 mmol/L — AB (ref 135–145)

## 2016-10-04 LAB — GLUCOSE, CAPILLARY
GLUCOSE-CAPILLARY: 123 mg/dL — AB (ref 65–99)
Glucose-Capillary: 181 mg/dL — ABNORMAL HIGH (ref 65–99)
Glucose-Capillary: 122 mg/dL — ABNORMAL HIGH (ref 65–99)

## 2016-10-04 LAB — POCT I-STAT, CHEM 8
BUN: 46 mg/dL — ABNORMAL HIGH (ref 6–20)
CREATININE: 1.7 mg/dL — AB (ref 0.61–1.24)
Calcium, Ion: 1.08 mmol/L — ABNORMAL LOW (ref 1.15–1.40)
Chloride: 94 mmol/L — ABNORMAL LOW (ref 101–111)
GLUCOSE: 121 mg/dL — AB (ref 65–99)
HCT: 36 % — ABNORMAL LOW (ref 39.0–52.0)
HEMOGLOBIN: 12.2 g/dL — AB (ref 13.0–17.0)
POTASSIUM: 3.4 mmol/L — AB (ref 3.5–5.1)
Sodium: 138 mmol/L (ref 135–145)
TCO2: 29 mmol/L (ref 0–100)

## 2016-10-04 LAB — BLOOD GAS, ARTERIAL
Acid-Base Excess: 2.9 mmol/L — ABNORMAL HIGH (ref 0.0–2.0)
Bicarbonate: 26.3 mmol/L (ref 20.0–28.0)
FIO2: 50
O2 Saturation: 97 %
Patient temperature: 98.6
pCO2 arterial: 36.5 mmHg (ref 32.0–48.0)
pH, Arterial: 7.472 — ABNORMAL HIGH (ref 7.350–7.450)
pO2, Arterial: 92.8 mmHg (ref 83.0–108.0)

## 2016-10-04 MED ORDER — POTASSIUM CHLORIDE 10 MEQ/100ML IV SOLN
10.0000 meq | INTRAVENOUS | Status: AC
  Administered 2016-10-04 (×3): 10 meq via INTRAVENOUS
  Filled 2016-10-04 (×3): qty 100

## 2016-10-04 MED ORDER — POTASSIUM CHLORIDE 10 MEQ/50ML IV SOLN
10.0000 meq | INTRAVENOUS | Status: AC
  Administered 2016-10-04 – 2016-10-05 (×2): 10 meq via INTRAVENOUS
  Filled 2016-10-04 (×2): qty 50

## 2016-10-04 MED ORDER — AMIODARONE LOAD VIA INFUSION
150.0000 mg | Freq: Once | INTRAVENOUS | Status: AC
  Administered 2016-10-04: 150 mg via INTRAVENOUS
  Filled 2016-10-04: qty 83.34

## 2016-10-04 MED ORDER — LEVALBUTEROL HCL 1.25 MG/0.5ML IN NEBU
1.2500 mg | INHALATION_SOLUTION | Freq: Three times a day (TID) | RESPIRATORY_TRACT | Status: DC
  Administered 2016-10-05 – 2016-10-08 (×11): 1.25 mg via RESPIRATORY_TRACT
  Filled 2016-10-04 (×12): qty 0.5

## 2016-10-04 MED ORDER — POTASSIUM CHLORIDE 10 MEQ/50ML IV SOLN
10.0000 meq | INTRAVENOUS | Status: DC | PRN
  Filled 2016-10-04: qty 50

## 2016-10-04 MED ORDER — LIDOCAINE HCL (PF) 1 % IJ SOLN
INTRAMUSCULAR | Status: AC
  Filled 2016-10-04: qty 10

## 2016-10-04 NOTE — Progress Notes (Signed)
      [address]       [place] [address]             [phone]       Up in chair  Tolerated thoracentesis well  BP 121/79   Pulse (!) 131   Temp 98 F (36.7 C) (Oral)   Resp 20   Ht 5\' 6"  (1.676 m)   Wt 211 lb 6.7 oz (95.9 kg)   SpO2 95%   BMI 34.12 kg/m   6L [place] 95% sat   Intake/Output Summary (Last 24 hours) at 10/04/16 1952 Last data filed at 10/04/16 1945  Gross per 24 hour  Intake           1259.3 ml  Output             1841 ml  Net           -581.7 ml   K= 3.4- supplement  Left thoracentesis done earlier- 800 ml of fluid drained  Will order right thoracentesis for tomorrow  Remo Lipps C. Roxan Hockey, MD [facility] [phone]

## 2016-10-04 NOTE — Progress Notes (Signed)
PULMONARY / CRITICAL CARE MEDICINE   Name: Ernest Booker MRN: [id number] DOB: 1945/07/13    ADMISSION DATE:  09/24/2016 CONSULTATION DATE:  6/14  REFERRING MD:  Prescott Gum   CHIEF COMPLAINT:  Acute respiratory failure   HISTORY OF PRESENT ILLNESS:   This is a 71 year old male w/ h/o HL and HTN, who was transferred from [facility] on 6/7 after being admitted for chest pain. Underwent cardiac cath which showed multivessel disease and was referred here for CABG. He underwent 4V CABG on 6/11.  He was extubated on 6/12. On 6/13 he was noted to be feeling poorly and had increased audible wheezing w/ ambulation. Also has had intermittent nausea and vomited the am of 6/14. Was felt volume overloaded and treated w/ additional diuresis. Later that evening again noted to have increased wheezing (w/out stethoscope) and desaturations requiring 6 liters to get sats to 89%. Treated again this time w/ lasix and nebs but team concerned about stridor. On 6/14 RR worse, desaturated to high 80s on NRB mask. Placed on NIPPV for loud upper airway noises. PCCM asked to assess.   SUBJECTIVE:  Off BiPap, on HFNC at 10 L, sitting up in chair,feels better, breathing better  VITAL SIGNS: BP 115/89   Pulse (!) 120   Temp 98.1 F (36.7 C)   Resp 20   Ht 5\' 6"  (1.676 m)   Wt 211 lb 6.7 oz (95.9 kg)   SpO2 96%   BMI 34.12 kg/m   HEMODYNAMICS:    VENTILATOR SETTINGS: FiO2 (%):  [50 %] 50 %  INTAKE / OUTPUT: I/O last 3 completed shifts: In: 2153.5 [P.O.:250; I.V.:1303.5; IV Piggyback:600] Out: 2635 [Urine:2635]  PHYSICAL EXAMINATION:  General:  Adult male in NAD sitting in chair wearing high flow [place] Neuro:  Awake, alert and oriented x 3, MAE x 4, appropriate HEENT:  [place]/AT, No JVD noted, PERRL, slight upper airway wheeze Cardiovascular:  S1, S2, a fib per monitor, No RMG noted Lungs:  Diminished R>L, scattered rhonchi which clear with cough Abdomen:  Firm, distended, non-tender, BS + Musculoskeletal:   No obvious deformity of decreased ROM noted Skin:  Intact, warm and dry, no rash or  Lesions noted     LABS:  BMET  Recent Labs Lab 10/02/16 0304  10/03/16 0340 10/03/16 1712 10/04/16 0347  NA 130*  < > 133* 135 134*  K 4.2  < > 3.8 3.6 4.0  CL 94*  < > 97* 97* 98*  CO2 25  --  26  --  26  BUN 24*  < > 32* 39* 43*  CREATININE 1.71*  < > 1.63* 1.60* 1.70*  GLUCOSE 170*  < > 145* 144* 127*  < > = values in this interval not displayed.  Electrolytes  Recent Labs Lab 09/28/16 2022 09/29/16 0336 09/29/16 1705  10/02/16 0304 10/03/16 0340 10/04/16 0347  CALCIUM  --  7.7*  --   < > 8.2* 8.5* 8.6*  MG 2.6* 2.3 2.2  --   --   --   --   < > = values in this interval not displayed.  CBC  Recent Labs Lab 10/02/16 0304  10/03/16 0340 10/03/16 1712 10/04/16 0347  WBC 11.6*  --  11.5*  --  15.7*  HGB 8.5*  < > 8.3* 8.2* 9.0*  HCT 25.2*  < > 24.7* 24.0* 27.1*  PLT 164  --  183  --  223  < > = values in this interval not displayed.  Coag's  Recent Labs Lab 09/28/16 1449  APTT 29  INR 1.41    Sepsis Markers  Recent Labs Lab 10/01/16 1314 10/02/16 0304 10/03/16 0340  PROCALCITON 0.82 1.22 0.87    ABG  Recent Labs Lab 10/02/16 0227 10/03/16 0400 10/04/16 0350  PHART 7.460* 7.447 7.472*  PCO2ART 36.1 40.1 36.5  PO2ART 137* 66.9* 92.8    Liver Enzymes  Recent Labs Lab 10/01/16 0226 10/02/16 0304 10/03/16 0340  AST 29 22 28   ALT 26 21 23   ALKPHOS 32* 34* 37*  BILITOT 1.1 0.9 1.5*  ALBUMIN 2.9* 3.1* 3.5    Cardiac Enzymes No results for input(s): TROPONINI, PROBNP in the last 168 hours.  Glucose  Recent Labs Lab 10/02/16 2327 10/03/16 0330 10/03/16 0751 10/03/16 1138 10/03/16 1644 10/03/16 2214  GLUCAP 136* 150* 155* 138* 142* 134*    Imaging Dg Chest Port 1 View  Result Date: 10/04/2016 CLINICAL DATA:  Pleural effusion EXAM: PORTABLE CHEST 1 VIEW COMPARISON:  10/03/2016 FINDINGS: Right-sided PICC tip overlying the level of  the lower superior vena cava. The heart is enlarged. There is veiling density of the right hemithorax, consistent with pleural effusion. Left pleural effusions also present. Bibasilar opacities obscure the hemidiaphragms consistent with atelectasis or infiltrates. IMPRESSION: Stable appearance of bilateral pleural effusions. Electronically Signed   By: Nolon Nations M.D.   On: 10/04/2016 07:27     STUDIES:  ECHO 6/14> LVEF 65-70%, small effusion, hyperdynamic LV CT Chest 6/15/>> Small bilateral pleural effusions, right greater than left, with partial consolidation of both lower lung lobes. Underlying pneumonia CXR 6/17>> Slight increase in effusion on the right  CULTURES:   ANTIBIOTICS: Zosyn 6/14>>>   SIGNIFICANT EVENTS:   LINES/TUBES:   DISCUSSION: 71 year old male s/p 4 V CABG 6/11. PCCM asked to see 6/14 for intermittent hypoxia and presumed laryngospasm. He was started on BiPAP with some improvement with plans for continued diuresis and treatment of possible PNA and was moved to ICU for closer monitoring. 6/15 he was able to come off BiPAP for some long stretches, but still requiring it PRN.   ASSESSMENT / PLAN:  Acute hypoxic respiratory failure in setting of what seems to be transient Laryngospasm +/-bronchospasm,  superimposed on element of pulmonary edema/volume overload. Can't exclude aspiration event as he has had some vomiting. Audible wheeze resolved with NIMV. No clear ileus on KUB. CT Chest negative for interstitial process such as amiodarone lung tox.  BiPAP overnight Remains on HFNC @ 10 L  Plan Cont NIPPV PRN Continue lasix BID as BUN/cr allows Empiric zosyn (day 3) , trend PCT. Plan 5- 7  day course PPI BID  Continue scheduled/ prn  Nebs Titrate/ wean  HFNC for saturations 88-92% Mobilize, walk in hall Aggressive pulmonary toilet,  IS Q 1 while awake.  Sputum culture if able Mucinex as mucolytic Consider Thoracentesis per TCTS Continue Lasix as renal  function allows  CAD now s/p 4V CABG No CHF based on echo. AF w/ RVR Plan:  Cont telemetry  Management per primary (currently on milrinone and amiodarone) Continue diuresis as renal function allows  Mild AKI Hyponatremia Plan Strict intake/output Follow BMP Correct/ replete electrolytes as indicated Avoid nephrotoxic medications/ Maintain renal perfusion  Infection: Leukocytosis Afebrile Clinically asymptomatic  Plan: Trend CBC and Fever Curve Sputum for culture if able Pan Culture if clinical S/S    GERD, gastric distention, N&V. No ileus on KUB 6/14 Having soft BM's per patient and nursing staff Plan Bowel rest PPI max  Scheduled reglan Zofran PRN   Acute blood loss anemia No obvious source of bleeding Plan Trend cbc/ platelets Trend Fever Curve/ WBC Monitor for bleeding Transfuse per protocol  Hold Lovenox for possible thoracentesis today   Hyperglycemia Plan CBG's  SSI   Discussion: Coughing up thick tan to green secretions. Will have nursing send for culture if able to get adequate sample.Contiue breahting treatments and mobilization, aggressive Pulmonary toilet. For Thoracentesis per TCTS for worsening R effusion  Magdalen Spatz AGACNP-BC [facility] Pager [phone] or [phone]  10/04/2016 8:03 AM

## 2016-10-04 NOTE — Progress Notes (Signed)
Pharmacy Antibiotic Note  Ernest Booker is a 71 y.o. male S/P CABG on 6/11 now noted with acute respiratory failure. Pharmacy consulted for Zosyn  Dosing for possible PNA. -WBC= 15.7, SCr= 1.7 (trend up), CrCl ~ 45  Plan: -Zosyn 3.375gm IV q8h -Will follow renal function, cultures and clinical progress -F/u length of antibiotic therapy.   Height: 5\' 6"  (167.6 cm) Weight: 211 lb 6.7 oz (95.9 kg) IBW/kg (Calculated) : 63.8  Temp (24hrs), Avg:97.9 F (36.6 C), Min:97.4 F (36.3 C), Max:98.4 F (36.9 C)   Recent Labs Lab 09/30/16 0410  10/01/16 0226  10/02/16 0304 10/02/16 1545 10/03/16 0340 10/03/16 1712 10/04/16 0347  WBC 11.7*  --  14.2*  --  11.6*  --  11.5*  --  15.7*  CREATININE 1.13  < > 1.38*  < > 1.71* 1.70* 1.63* 1.60* 1.70*  < > = values in this interval not displayed.  Estimated Creatinine Clearance: 45.1 mL/min (A) (by C-G formula based on SCr of 1.7 mg/dL (H)).    Allergies  Allergen Reactions  . Benadryl [Diphenhydramine Hcl (Sleep)]     UNSPECIFIED REACTION   . Levofloxacin     UNSPECIFIED REACTION     Antimicrobials this admission: 6/14 zosyn>>   Dose adjustments this admission:     Microbiology results: 6/8 MRSA PCR- neg 6/8 Surgical PCR + staph aureus (neg MRSA)  Thank you for allowing pharmacy to be a part of this patient's care.  Uvaldo Rising, BCPS  Clinical Pharmacist Pager [phone]  10/04/2016 1:26 PM

## 2016-10-04 NOTE — Progress Notes (Signed)
6 Days Post-Op Procedure(s) (LRB): CORONARY ARTERY BYPASS GRAFTING (CABG) x four, using left internal mammary artery and right leg greater saphenous vein harvested endoscopically (N/A) TRANSESOPHAGEAL ECHOCARDIOGRAM (TEE) (N/A) Subjective: C/o irritation from Foley. Thinks breathing may be a little better  Objective: Vital signs in last 24 hours: Temp:  [97.4 F (36.3 C)-98.4 F (36.9 C)] 98.1 F (36.7 C) (06/17 0747) Pulse Rate:  [68-122] 120 (06/17 0700) Cardiac Rhythm: Atrial fibrillation (06/16 2000) Resp:  [16-29] 20 (06/17 0700) BP: (115-173)/(73-105) 115/89 (06/17 0700) SpO2:  [93 %-98 %] 96 % (06/17 0714) Arterial Line BP: (125-218)/(66-94) 171/74 (06/17 0500) FiO2 (%):  [50 %] 50 % (06/17 0400) Weight:  [211 lb 6.7 oz (95.9 kg)] 211 lb 6.7 oz (95.9 kg) (06/17 0500)  Hemodynamic parameters for last 24 hours:    Intake/Output from previous day: 06/16 0701 - 06/17 0700 In: 1564.3 [P.O.:250; I.V.:1014.3; IV Piggyback:300] Out: 1960 [Urine:1960] Intake/Output this shift: No intake/output data recorded.  General appearance: alert, cooperative and no distress Neurologic: intact Heart: irregularly irregular rhythm Lungs: decreased at right base, no wheeziong at present Wound: clean and dry  Lab Results:  Recent Labs  10/03/16 0340 10/03/16 1712 10/04/16 0347  WBC 11.5*  --  15.7*  HGB 8.3* 8.2* 9.0*  HCT 24.7* 24.0* 27.1*  PLT 183  --  223   BMET:  Recent Labs  10/03/16 0340 10/03/16 1712 10/04/16 0347  NA 133* 135 134*  K 3.8 3.6 4.0  CL 97* 97* 98*  CO2 26  --  26  GLUCOSE 145* 144* 127*  BUN 32* 39* 43*  CREATININE 1.63* 1.60* 1.70*  CALCIUM 8.5*  --  8.6*    PT/INR: No results for input(s): LABPROT, INR in the last 72 hours. ABG    Component Value Date/Time   PHART 7.472 (H) 10/04/2016 0350   HCO3 26.3 10/04/2016 0350   TCO2 26 10/03/2016 1712   ACIDBASEDEF 4.0 (H) 09/28/2016 2025   O2SAT 59.7 10/04/2016 0400   CBG (last 3)   Recent  Labs  10/03/16 1138 10/03/16 1644 10/03/16 2214  GLUCAP 138* 142* 134*    Assessment/Plan: S/P Procedure(s) (LRB): CORONARY ARTERY BYPASS GRAFTING (CABG) x four, using left internal mammary artery and right leg greater saphenous vein harvested endoscopically (N/A) TRANSESOPHAGEAL ECHOCARDIOGRAM (TEE) (N/A) -CV- continues with atrial fib- rate up this AM  On amiodarone drip and low dose lopressor   -RESP- status remains marginal although slowly improving overall  Right pleural effusion looks larger on CXR this AM, CT from a couple of days ago showed both effusion and atelectasis/ consolidation at right base. I think he would benefit from thoracentesis.  On Zosyn for presumed aspiration  -RENAL- creatinine relatively stable, but BUN rising  Continue diuresis  Dc Foley  -ENDO- CBG better controlled  - Deconditioning- has been ambulating   LOS: 10 days    Melrose Nakayama 10/04/2016

## 2016-10-04 NOTE — Progress Notes (Signed)
Patient tolerating 6L [place] HFNC with sp02 98%. No respiratory distress noted. BIPAP is not needed at this time. RT will monitor as needed.

## 2016-10-04 NOTE — Procedures (Signed)
PROCEDURE SUMMARY:  Successful US guided left thoracentesis. Yielded 800 mL of dark amber colored fluid. Pt tolerated procedure well. No immediate complications.  Specimen was not sent for labs. CXR ordered.  Ascencion Dike PA-C 10/04/2016 10:18 AM

## 2016-10-05 ENCOUNTER — Inpatient Hospital Stay (HOSPITAL_COMMUNITY): Payer: BLUE CROSS/BLUE SHIELD

## 2016-10-05 DIAGNOSIS — I471 Supraventricular tachycardia: Secondary | ICD-10-CM

## 2016-10-05 DIAGNOSIS — J9690 Respiratory failure, unspecified, unspecified whether with hypoxia or hypercapnia: Secondary | ICD-10-CM

## 2016-10-05 DIAGNOSIS — J90 Pleural effusion, not elsewhere classified: Secondary | ICD-10-CM

## 2016-10-05 LAB — COOXEMETRY PANEL
Carboxyhemoglobin: 1.4 % (ref 0.5–1.5)
Methemoglobin: 1 % (ref 0.0–1.5)
O2 Saturation: 67.4 %
Total hemoglobin: 9.8 g/dL — ABNORMAL LOW (ref 12.0–16.0)

## 2016-10-05 LAB — GLUCOSE, CAPILLARY
Glucose-Capillary: 100 mg/dL — ABNORMAL HIGH (ref 65–99)
Glucose-Capillary: 97 mg/dL (ref 65–99)
Glucose-Capillary: 97 mg/dL (ref 65–99)

## 2016-10-05 LAB — BASIC METABOLIC PANEL
ANION GAP: 8 (ref 5–15)
Anion gap: 5 (ref 5–15)
Anion gap: 8 (ref 5–15)
BUN: 43 mg/dL — AB (ref 6–20)
BUN: 43 mg/dL — ABNORMAL HIGH (ref 6–20)
BUN: 38 mg/dL — ABNORMAL HIGH (ref 6–20)
CALCIUM: 8 mg/dL — AB (ref 8.9–10.3)
CALCIUM: 8 mg/dL — AB (ref 8.9–10.3)
CHLORIDE: 100 mmol/L — AB (ref 101–111)
CO2: 28 mmol/L (ref 22–32)
CO2: 27 mmol/L (ref 22–32)
CO2: 27 mmol/L (ref 22–32)
CREATININE: 1.65 mg/dL — AB (ref 0.61–1.24)
Calcium: 8.3 mg/dL — ABNORMAL LOW (ref 8.9–10.3)
Chloride: 99 mmol/L — ABNORMAL LOW (ref 101–111)
Chloride: 98 mmol/L — ABNORMAL LOW (ref 101–111)
Creatinine, Ser: 1.61 mg/dL — ABNORMAL HIGH (ref 0.61–1.24)
Creatinine, Ser: 1.65 mg/dL — ABNORMAL HIGH (ref 0.61–1.24)
GFR calc Af Amer: 48 mL/min — ABNORMAL LOW (ref >60)
GFR calc Af Amer: 48 mL/min — ABNORMAL LOW (ref >60)
GFR calc non Af Amer: 41 mL/min — ABNORMAL LOW (ref >60)
GFR, EST AFRICAN AMERICAN: 49 mL/min — AB (ref >60)
GFR, EST NON AFRICAN AMERICAN: 41 mL/min — AB (ref >60)
GFR, EST NON AFRICAN AMERICAN: 42 mL/min — AB (ref >60)
Glucose, Bld: 171 mg/dL — ABNORMAL HIGH (ref 65–99)
Glucose, Bld: 110 mg/dL — ABNORMAL HIGH (ref 65–99)
Glucose, Bld: 139 mg/dL — ABNORMAL HIGH (ref 65–99)
POTASSIUM: 3.5 mmol/L (ref 3.5–5.1)
Potassium: 7.3 mmol/L (ref 3.5–5.1)
Potassium: 3.7 mmol/L (ref 3.5–5.1)
SODIUM: 133 mmol/L — AB (ref 135–145)
Sodium: 134 mmol/L — ABNORMAL LOW (ref 135–145)
Sodium: 133 mmol/L — ABNORMAL LOW (ref 135–145)

## 2016-10-05 LAB — BLOOD GAS, ARTERIAL
Acid-Base Excess: 3.8 mmol/L — ABNORMAL HIGH (ref 0.0–2.0)
Bicarbonate: 27 mmol/L (ref 20.0–28.0)
Drawn by: 441351
O2 Content: 4 L/min
O2 Saturation: 95.5 %
Patient temperature: 98.6
pCO2 arterial: 35.7 mmHg (ref 32.0–48.0)
pH, Arterial: 7.492 — ABNORMAL HIGH (ref 7.350–7.450)
pO2, Arterial: 75.8 mmHg — ABNORMAL LOW (ref 83.0–108.0)

## 2016-10-05 LAB — CBC
HCT: 29 % — ABNORMAL LOW (ref 39.0–52.0)
Hemoglobin: 9.4 g/dL — ABNORMAL LOW (ref 13.0–17.0)
MCH: 29.6 pg (ref 26.0–34.0)
MCHC: 32.4 g/dL (ref 30.0–36.0)
MCV: 91.2 fL (ref 78.0–100.0)
PLATELETS: 212 10*3/uL (ref 150–400)
RBC: 3.18 MIL/uL — ABNORMAL LOW (ref 4.22–5.81)
RDW: 15.7 % — AB (ref 11.5–15.5)
WBC: 11.6 10*3/uL — ABNORMAL HIGH (ref 4.0–10.5)

## 2016-10-05 MED ORDER — DILTIAZEM HCL 100 MG IV SOLR
10.0000 mg/h | INTRAVENOUS | Status: DC
  Administered 2016-10-05 (×3): 10 mg/h via INTRAVENOUS
  Filled 2016-10-05 (×3): qty 100

## 2016-10-05 MED ORDER — FUROSEMIDE 10 MG/ML IJ SOLN
40.0000 mg | Freq: Every day | INTRAMUSCULAR | Status: DC
Start: 2016-10-05 — End: 2016-10-06
  Administered 2016-10-05: 40 mg via INTRAVENOUS

## 2016-10-05 MED ORDER — POTASSIUM CHLORIDE 10 MEQ/50ML IV SOLN
10.0000 meq | INTRAVENOUS | Status: AC
  Administered 2016-10-05 (×2): 10 meq via INTRAVENOUS
  Filled 2016-10-05 (×2): qty 50

## 2016-10-05 MED ORDER — METOCLOPRAMIDE HCL 5 MG/ML IJ SOLN
10.0000 mg | Freq: Four times a day (QID) | INTRAMUSCULAR | Status: AC
  Administered 2016-10-05 – 2016-10-07 (×7): 10 mg via INTRAVENOUS
  Filled 2016-10-05 (×7): qty 2

## 2016-10-05 NOTE — Progress Notes (Signed)
CRITICAL VALUE ALERT  Critical Value:  Potassium 7.3  Date & Time Notied:  10/05/16 @ 0432  Provider Notified: Roxan Hockey @ 0530  Orders Received/Actions taken: [facility] lab

## 2016-10-05 NOTE — Progress Notes (Signed)
Progress Note  Patient Name: Ernest Booker Date of Encounter: 10/05/2016  Primary Cardiologist: Dr. Agustin Cree  Subjective   Not currently feeling palpitations.  Feels ok at this time.   Inpatient Medications    Scheduled Meds: . aspirin EC  325 mg Oral Daily   Or  . aspirin  324 mg Per Tube Daily  . atorvastatin  80 mg Oral q1800  . Chlorhexidine Gluconate Cloth  6 each Topical Daily  . enoxaparin (LOVENOX) injection  40 mg Subcutaneous Q24H  . furosemide  40 mg Intravenous Daily  . guaiFENesin  1,200 mg Oral BID  . insulin aspart  0-15 Units Subcutaneous TID WC  . insulin aspart  0-5 Units Subcutaneous QHS  . levalbuterol  1.25 mg Nebulization TID  . mouth rinse  15 mL Mouth Rinse BID  . metoCLOPramide (REGLAN) injection  10 mg Intravenous Q6H  . metoprolol tartrate  12.5 mg Oral BID  . pantoprazole  40 mg Oral BID  . sodium chloride flush  3 mL Intravenous Q12H   Continuous Infusions: . sodium chloride Stopped (09/29/16 1100)  . sodium chloride    . sodium chloride Stopped (09/29/16 1100)  . sodium chloride    . amiodarone 30 mg/hr (10/05/16 0817)  . diltiazem (CARDIZEM) infusion 10 mg/hr (10/05/16 0818)  . lactated ringers    . lactated ringers Stopped (09/30/16 0400)  . milrinone 0.125 mcg/kg/min (10/05/16 0749)  . piperacillin-tazobactam (ZOSYN)  IV Stopped (10/05/16 0721)  . potassium chloride    . potassium chloride 10 mEq (10/05/16 0925)   PRN Meds: sodium chloride, Place/Maintain arterial line **AND** sodium chloride, levalbuterol, metoprolol tartrate, ondansetron (ZOFRAN) IV, oxyCODONE, potassium chloride, promethazine, sodium chloride flush, sodium chloride flush, traMADol, zolpidem   Vital Signs    Vitals:   10/05/16 0759 10/05/16 0800 10/05/16 0900 10/05/16 0936  BP:  123/81    Pulse:  (!) 125 (!) 128   Resp:  (!) 23 20   Temp: 98.4 F (36.9 C)     TempSrc: Oral     SpO2:  92% 95% 96%  Weight:   208 lb 6.4 oz (94.5 kg)   Height:         Intake/Output Summary (Last 24 hours) at 10/05/16 1013 Last data filed at 10/05/16 0850  Gross per 24 hour  Intake           1195.1 ml  Output             1826 ml  Net           -630.9 ml   Filed Weights   10/03/16 0600 10/04/16 0500 10/05/16 0900  Weight: 212 lb 15.4 oz (96.6 kg) 211 lb 6.7 oz (95.9 kg) 208 lb 6.4 oz (94.5 kg)    Telemetry    tachycardia - Personally Reviewed  ECG    Atrial pacing, IRBBB, PAC - Personally Reviewed  Physical Exam   GEN: No acute distress.   Neck: No JVD Cardiac: tachycardia, no murmurs, rubs, or gallops.  Respiratory: Clear to auscultation bilaterally. GI: Soft, nontender, non-distended  MS: Tr pretibial edemaedema; No deformity. Neuro:  Nonfocal  Psych: Flat affect   Labs    Chemistry Recent Labs Lab 10/01/16 0226  10/02/16 0304  10/03/16 0340  10/04/16 0347 10/04/16 1618 10/05/16 0342 10/05/16 0605  NA 132*  < > 130*  < > 133*  < > 134* 138 133* 134*  K 3.7  < > 4.2  < > 3.8  < > 4.0  3.4* 7.3* 3.5  CL 97*  < > 94*  < > 97*  < > 98* 94* 100* 99*  CO2 27  --  25  --  26  --  26  --  28 27  GLUCOSE 169*  < > 170*  < > 145*  < > 127* 121* 171* 110*  BUN 14  < > 24*  < > 32*  < > 43* 46* 43* 43*  CREATININE 1.38*  < > 1.71*  < > 1.63*  < > 1.70* 1.70* 1.65* 1.61*  CALCIUM 7.9*  --  8.2*  --  8.5*  --  8.6*  --  8.0* 8.0*  PROT 5.2*  --  5.3*  --  6.2*  --   --   --   --   --   ALBUMIN 2.9*  --  3.1*  --  3.5  --   --   --   --   --   AST 29  --  22  --  28  --   --   --   --   --   ALT 26  --  21  --  23  --   --   --   --   --   ALKPHOS 32*  --  34*  --  37*  --   --   --   --   --   BILITOT 1.1  --  0.9  --  1.5*  --   --   --   --   --   GFRNONAA 51*  --  39*  --  42*  --  40*  --  41* 42*  GFRAA 59*  --  46*  --  48*  --  46*  --  48* 49*  ANIONGAP 8  --  11  --  10  --  10  --  5 8  < > = values in this interval not displayed.   Hematology Recent Labs Lab 10/03/16 0340  10/04/16 0347 10/04/16 1618  10/05/16 0342  WBC 11.5*  --  15.7*  --  11.6*  RBC 2.80*  --  3.03*  --  3.18*  HGB 8.3*  < > 9.0* 12.2* 9.4*  HCT 24.7*  < > 27.1* 36.0* 29.0*  MCV 88.2  --  89.4  --  91.2  MCH 29.6  --  29.7  --  29.6  MCHC 33.6  --  33.2  --  32.4  RDW 14.7  --  15.4  --  15.7*  PLT 183  --  223  --  212  < > = values in this interval not displayed.  Cardiac EnzymesNo results for input(s): TROPONINI in the last 168 hours. No results for input(s): TROPIPOC in the last 168 hours.   BNPNo results for input(s): BNP, PROBNP in the last 168 hours.   DDimer No results for input(s): DDIMER in the last 168 hours.   Radiology    Dg Chest 1 View  Result Date: 10/04/2016 CLINICAL DATA:  Status post left thoracentesis. EXAM: CHEST 1 VIEW COMPARISON:  10/04/2016 FINDINGS: Right-sided PICC line tip overlies the superior vena cava. The heart is enlarged. Status post median sternotomy and CABG. There is no pneumothorax following left thoracentesis. There has been improvement in aeration of the left lung base. Small left pleural effusion remains and there is persistent opacity in the medial left lower lobe. Persistent right lung base opacity and pleural effusion.  IMPRESSION: No pneumothorax following left thoracentesis. Improved aeration in the left lower lobe. Electronically Signed   By: Nolon Nations M.D.   On: 10/04/2016 10:56   Dg Chest Port 1 View  Result Date: 10/05/2016 CLINICAL DATA:  Shortness of breath.  Pleural effusion . EXAM: PORTABLE CHEST 1 VIEW COMPARISON:  Chest x-ray 10/04/2016. FINDINGS: Right PICC line stable position. Prior CABG. Persistent cardiomegaly with bilateral pulmonary infiltrates/edema and small pleural effusions. Findings consistent CHF. Interim slight worsening from prior exam. Pneumothorax . IMPRESSION: 1. Right PICC line stable position. 2. Prior CABG. Cardiomegaly with bilateral pulmonary infiltrates/ edema and bilateral small pleural effusions. Findings consistent with CHF.  Interim slight worsening from prior exam. Electronically Signed   By: Marcello Moores  Register   On: 10/05/2016 07:36   Dg Chest Port 1 View  Result Date: 10/04/2016 CLINICAL DATA:  Pleural effusion EXAM: PORTABLE CHEST 1 VIEW COMPARISON:  10/03/2016 FINDINGS: Right-sided PICC tip overlying the level of the lower superior vena cava. The heart is enlarged. There is veiling density of the right hemithorax, consistent with pleural effusion. Left pleural effusions also present. Bibasilar opacities obscure the hemidiaphragms consistent with atelectasis or infiltrates. IMPRESSION: Stable appearance of bilateral pleural effusions. Electronically Signed   By: Nolon Nations M.D.   On: 10/04/2016 07:27   US Thoracentesis Asp Pleural Space W/img Guide  Result Date: 10/04/2016 INDICATION: Postop CABG. Bilateral pleural effusions. Request therapeutic thoracentesis. EXAM: ULTRASOUND GUIDED LEFT THORACENTESIS MEDICATIONS: None. COMPLICATIONS: None immediate. Postprocedural chest x-ray negative for pneumothorax. PROCEDURE: An ultrasound guided thoracentesis was thoroughly discussed with the patient and questions answered. The benefits, risks, alternatives and complications were also discussed. The patient understands and wishes to proceed with the procedure. Written consent was obtained. Ultrasound of the chest finds bilateral moderate-sized pleural effusions. Left side chosen for thoracentesis today. Patient may benefit from right-sided thoracentesis as well. Ultrasound was performed to localize and mark an adequate pocket of fluid in the left chest. The area was then prepped and draped in the normal sterile fashion. 1% Lidocaine was used for local anesthesia. Under ultrasound guidance a Safe-T-Centesis catheter was introduced. Thoracentesis was performed. The catheter was removed and a dressing applied. FINDINGS: A total of approximately 800 mL of clear, dark amber colored fluid was removed. IMPRESSION: Successful ultrasound  guided left thoracentesis yielding 100 mL of pleural fluid. Right-sided pleural effusion. Read by: Ascencion Dike PA-C Electronically Signed   By: Corrie Mckusick D.O.   On: 10/04/2016 10:58    Cardiac Studies     Patient Profile     71 y.o. male s/p CABG  Assessment & Plan    1) CAD: post CABG  2) I reviewed the tele.  I think his rhythm is likely atrial tach rather than atrial fibrillation this AM.  The rate has been stuck at 125, 126 and regular.  THere appear to be P waves, although the rhythm does not appear to be sinus.  COntinue Cardizem and Amio.  If current meds unsuccessful, could consider IV adenosine 6 mg, then 12 mg if no response, to see if this breaks the tachycardia  Signed, Larae Grooms, MD  10/05/2016, 10:13 AM

## 2016-10-05 NOTE — Progress Notes (Signed)
Patient resting comfortably on 4L [place] with no respiratory distress noted. BIPAP is not needed at this time. RT will monitor as needed. 

## 2016-10-05 NOTE — Progress Notes (Signed)
Physical Therapy Treatment Patient Details Name: Ernest Booker MRN: [id number] DOB: [birth year] Today's Date: 10/05/2016    History of Present Illness Pt adm with NSTEMI and underwent CABG x 4 on 6/11. Pt developed respiratory failure on 6/14 and required bipap. PMH - HTN    PT Comments    Pt admitted with above diagnosis. Pt currently with functional limitations due to balance and endurance deficits. Pt was able to ambulate entire unit with occasional desaturation needing pursed lip breathing.  Overall steady gait with RW.  Will continue acute PT.   Pt will benefit from skilled PT to increase their independence and safety with mobility to allow discharge to the venue listed below.     Follow Up Recommendations  Home health PT;Supervision/Assistance - 24 hour     Equipment Recommendations  Other (comment) (To be assessed)    Recommendations for Other Services       Precautions / Restrictions Precautions Precautions: Sternal;Fall Precaution Comments: watch SpO2 Restrictions Weight Bearing Restrictions: Yes (sternal precautions)    Mobility  Bed Mobility               General bed mobility comments: In chair on arrival  Transfers Overall transfer level: Needs assistance Equipment used: Rolling walker (2 wheeled) Transfers: Sit to/from [facility] Sit to Stand: +2 physical assistance;Min assist         General transfer comment: Verbal cues for technique and to follow sternal precautions. Assist to bring hips up and for stability.  Pt on 4L of HFNCO2. SpO2 87-92% on 4LO2 which improved if pt performed pursed lip breathing. 92-125 bpm.  BP 134/87 initially and 122/85 post treatment.   Ambulation/Gait Ambulation/Gait assistance: Min assist;+2 safety/equipment Ambulation Distance (Feet): 700 Feet Assistive device: Rolling walker (2 wheeled) Gait Pattern/deviations: Step-through pattern;Decreased stride length;Drifts right/left;Trunk flexed   Gait velocity  interpretation: Below normal speed for age/gender General Gait Details: Pt generally steady with RW.  Ambulated entire unit desatting occasionally needing standing rest breaks and breathing techniques.     Stairs            Wheelchair Mobility    Modified Rankin (Stroke Patients Only)       Balance Overall balance assessment: Needs assistance Sitting-balance support: No upper extremity supported;Feet supported Sitting balance-Leahy Scale: Good     Standing balance support: Bilateral upper extremity supported Standing balance-Leahy Scale: Poor Standing balance comment: walker and min guard for static standing                            Cognition Arousal/Alertness: Awake/alert Behavior During Therapy: WFL for tasks assessed/performed Overall Cognitive Status: Within Functional Limits for tasks assessed                                        Exercises      General Comments General comments (skin integrity, edema, etc.):  Pt on 4L of HFNCO2. SpO2 87-92% on 4LO2 which improved if pt performed pursed lip breathing. 92-125 bpm. BP 134/87 initially and 122/85 post treatment.       Pertinent Vitals/Pain Pain Assessment: 0-10 Pain Score: 0-No pain    Home Living                      Prior Function            PT Goals (  current goals can now be found in the care plan section) Progress towards PT goals: Progressing toward goals    Frequency    Min 3X/week      PT Plan Current plan remains appropriate    Co-evaluation              AM-PAC PT "6 Clicks" Daily Activity  Outcome Measure  Difficulty turning over in bed (including adjusting bedclothes, sheets and blankets)?: Total Difficulty moving from lying on back to sitting on the side of the bed? : Total Difficulty sitting down on and standing up from a chair with arms (e.g., wheelchair, bedside commode, etc,.)?: A Little Help needed moving to and from a bed to chair  (including a wheelchair)?: A Little Help needed walking in hospital room?: A Little Help needed climbing 3-5 steps with a railing? : A Lot 6 Click Score: 13    End of Session Equipment Utilized During Treatment: Oxygen;Gait belt Activity Tolerance: Patient limited by fatigue Patient left: in chair;with call bell/phone within reach;with chair alarm set Nurse Communication: Mobility status PT Visit Diagnosis: Unsteadiness on feet (R26.81);Difficulty in walking, not elsewhere classified (R26.2)     Time: 2353-6144 PT Time Calculation (min) (ACUTE ONLY): 31 min  Charges:  $Gait Training: 23-37 mins                    G Codes:       Ernest Booker,PT Acute Rehabilitation [phone] (pager)    Ernest Booker 10/05/2016, 4:17 PM

## 2016-10-05 NOTE — Progress Notes (Signed)
7 Days Post-Op Procedure(s) (LRB): CORONARY ARTERY BYPASS GRAFTING (CABG) x four, using left internal mammary artery and right leg greater saphenous vein harvested endoscopically (N/A) TRANSESOPHAGEAL ECHOCARDIOGRAM (TEE) (N/A) Subjective: Breathing better after L thoracentesis Back in afib  Objective: Vital signs in last 24 hours: Temp:  [98 F (36.7 C)-98.4 F (36.9 C)] 98.4 F (36.9 C) (06/18 0300) Pulse Rate:  [66-131] 112 (06/18 0700) Cardiac Rhythm: Atrial fibrillation (06/18 0600) Resp:  [12-24] 12 (06/18 0700) BP: (88-147)/(59-101) 111/95 (06/18 0700) SpO2:  [94 %-100 %] 94 % (06/18 0700)  Hemodynamic parameters for last 24 hours: CVP:  [12 mmHg-27 mmHg] 20 mmHg  Intake/Output from previous day: 06/17 0701 - 06/18 0700 In: 1339.4 [P.O.:210; I.V.:604.4; IV Piggyback:450] Out: 7096 [Urine:1825; Stool:1] Intake/Output this shift: No intake/output data recorded.       Exam    General- alert and comfortable   Lungs- clear without rales, wheezes   Cor- regular rate and rhythm, no murmur , gallop   Abdomen- soft, non-tender   Extremities - warm, non-tender, minimal edema   Neuro- oriented, appropriate, no focal weakness   Lab Results:  Recent Labs  10/04/16 0347 10/04/16 1618 10/05/16 0342  WBC 15.7*  --  11.6*  HGB 9.0* 12.2* 9.4*  HCT 27.1* 36.0* 29.0*  PLT 223  --  212   BMET:  Recent Labs  10/05/16 0342 10/05/16 0605  NA 133* 134*  K 7.3* 3.5  CL 100* 99*  CO2 28 27  GLUCOSE 171* 110*  BUN 43* 43*  CREATININE 1.65* 1.61*  CALCIUM 8.0* 8.0*    PT/INR: No results for input(s): LABPROT, INR in the last 72 hours. ABG    Component Value Date/Time   PHART 7.492 (H) 10/05/2016 0445   HCO3 27.0 10/05/2016 0445   TCO2 29 10/04/2016 1618   ACIDBASEDEF 4.0 (H) 09/28/2016 2025   O2SAT 95.5 10/05/2016 0445   CBG (last 3)   Recent Labs  10/04/16 0751 10/04/16 1210 10/04/16 2317  GLUCAP 123* 181* 122*    Assessment/Plan: S/P Procedure(s)  (LRB): CORONARY ARTERY BYPASS GRAFTING (CABG) x four, using left internal mammary artery and right leg greater saphenous vein harvested endoscopically (N/A) TRANSESOPHAGEAL ECHOCARDIOGRAM (TEE) (N/A) Start iv Cardizem for afib Wean milrinone - co-ox > 65% Keep in iCU due to rapid afib, pulmonary status  LOS: 11 days    Tharon Aquas Trigt III 10/05/2016

## 2016-10-05 NOTE — Progress Notes (Signed)
Patient ID: Ernest Booker, male   DOB: 10/31/1945, 71 y.o.   MRN: [id number] EVENING ROUNDS NOTE :     [address]       [place] [address]             [phone]                 7 Days Post-Op Procedure(s) (LRB): CORONARY ARTERY BYPASS GRAFTING (CABG) x four, using left internal mammary artery and right leg greater saphenous vein harvested endoscopically (N/A) TRANSESOPHAGEAL ECHOCARDIOGRAM (TEE) (N/A)  Total Length of Stay:  LOS: 11 days  BP 112/73   Pulse 89   Temp 98.2 F (36.8 C) (Oral)   Resp 16   Ht 5\' 6"  (1.676 m)   Wt 208 lb 6.4 oz (94.5 kg)   SpO2 95%   BMI 33.64 kg/m   .Intake/Output      06/17 0701 - 06/18 0700 06/18 0701 - 06/19 0700   P.O. 210 720   I.V. (mL/kg) 604.4 (6.3) 1250.3 (13.2)   Other 75    IV Piggyback 450 50   Total Intake(mL/kg) 1339.4 (14) 2020.3 (21.4)   Urine (mL/kg/hr) 1825 (0.8) 1275 (1.2)   Stool 1 (0) 0 (0)   Total Output 1826 1275   Net -486.6 +745.3        Urine Occurrence 1 x 1 x   Stool Occurrence 1 x 1 x     . sodium chloride Stopped (09/29/16 1100)  . sodium chloride    . sodium chloride Stopped (09/29/16 1100)  . sodium chloride    . amiodarone 30 mg/hr (10/05/16 0817)  . diltiazem (CARDIZEM) infusion 10 mg/hr (10/05/16 1319)  . lactated ringers    . lactated ringers Stopped (09/30/16 0400)  . milrinone 0.125 mcg/kg/min (10/05/16 0749)  . piperacillin-tazobactam (ZOSYN)  IV Stopped (10/05/16 1557)  . potassium chloride       Lab Results  Component Value Date   WBC 11.6 (H) 10/05/2016   HGB 9.4 (L) 10/05/2016   HCT 29.0 (L) 10/05/2016   PLT 212 10/05/2016   GLUCOSE 110 (H) 10/05/2016   CHOL 116 09/26/2016   TRIG 103 09/26/2016   HDL 34 (L) 09/26/2016   LDLCALC 61 09/26/2016   ALT 23 10/03/2016   AST 28 10/03/2016   NA 134 (L) 10/05/2016   K 3.5 10/05/2016   CL 99 (L) 10/05/2016   CREATININE 1.61 (H) 10/05/2016   BUN 43 (H) 10/05/2016   CO2 27 10/05/2016   TSH 2.023 09/26/2016   INR 1.41  09/28/2016   HGBA1C 5.7 (H) 09/29/2016   On iv Cardizem and amiodarone for afib Still afib but reate decreased from this am    Grace Isaac MD  Beeper [phone] Office [phone] 10/05/2016 5:49 PM

## 2016-10-05 NOTE — Progress Notes (Signed)
PULMONARY / CRITICAL CARE MEDICINE   Name: Ernest Booker MRN: [id number] DOB: 13-Feb-1946    ADMISSION DATE:  09/24/2016 CONSULTATION DATE:  6/14  REFERRING MD:  Prescott Gum   CHIEF COMPLAINT:  Acute respiratory failure   HISTORY OF PRESENT ILLNESS:   This is a 71 year old male w/ h/o HL and HTN, who was transferred from [facility] on 6/7 after being admitted for chest pain. Underwent cardiac cath which showed multivessel disease and was referred here for CABG. He underwent 4V CABG on 6/11.  He was extubated on 6/12. On 6/13 he was noted to be feeling poorly and had increased audible wheezing w/ ambulation. Also has had intermittent nausea and vomited the am of 6/14. Was felt volume overloaded and treated w/ additional diuresis. Later that evening again noted to have increased wheezing (w/out stethoscope) and desaturations requiring 6 liters to get sats to 89%. Treated again this time w/ lasix and nebs but team concerned about stridor. On 6/14 RR worse, desaturated to high 80s on NRB mask. Placed on NIPPV for loud upper airway noises. PCCM asked to assess.   SUBJECTIVE:  Has not required bipap since yesterday.  On 4LNC.  Had L thoracentesis yesterday, plan for R today in IR.   VITAL SIGNS: BP 123/81 (BP Location: Left Arm)   Pulse (!) 128   Temp 98.4 F (36.9 C) (Oral)   Resp 20   Ht 5\' 6"  (1.676 m)   Wt 94.5 kg (208 lb 6.4 oz)   SpO2 95%   BMI 33.64 kg/m   HEMODYNAMICS: CVP:  [12 mmHg-27 mmHg] 20 mmHg  VENTILATOR SETTINGS:    INTAKE / OUTPUT: I/O last 3 completed shifts: In: 1998.6 [P.O.:210; I.V.:1113.6; Other:75; IV [id number]:417] Out: 2566 [Urine:2565; Stool:1]  PHYSICAL EXAMINATION:  General:  Adult male, NAD on bedside commode HEENT: MM pink/moist Neuro: awake, alert appropriate CV: s1s2 AFib PULM: even/non-labored on 4LNC, few exp wheeze, scattered rhonchi  EY:CXKG, non-tender, bsx4 active  Extremities: warm/dry, no sig edema  Skin: no rashes or  lesions    LABS:  BMET  Recent Labs Lab 10/04/16 0347 10/04/16 1618 10/05/16 0342 10/05/16 0605  NA 134* 138 133* 134*  K 4.0 3.4* 7.3* 3.5  CL 98* 94* 100* 99*  CO2 26  --  28 27  BUN 43* 46* 43* 43*  CREATININE 1.70* 1.70* 1.65* 1.61*  GLUCOSE 127* 121* 171* 110*    Electrolytes  Recent Labs Lab 09/28/16 2022 09/29/16 0336 09/29/16 1705  10/04/16 0347 10/05/16 0342 10/05/16 0605  CALCIUM  --  7.7*  --   < > 8.6* 8.0* 8.0*  MG 2.6* 2.3 2.2  --   --   --   --   < > = values in this interval not displayed.  CBC  Recent Labs Lab 10/03/16 0340  10/04/16 0347 10/04/16 1618 10/05/16 0342  WBC 11.5*  --  15.7*  --  11.6*  HGB 8.3*  < > 9.0* 12.2* 9.4*  HCT 24.7*  < > 27.1* 36.0* 29.0*  PLT 183  --  223  --  212  < > = values in this interval not displayed.  Coag's  Recent Labs Lab 09/28/16 1449  APTT 29  INR 1.41    Sepsis Markers  Recent Labs Lab 10/01/16 1314 10/02/16 0304 10/03/16 0340  PROCALCITON 0.82 1.22 0.87    ABG  Recent Labs Lab 10/03/16 0400 10/04/16 0350 10/05/16 0445  PHART 7.447 7.472* 7.492*  PCO2ART 40.1 36.5 35.7  PO2ART 66.9* 92.8 75.8*    Liver Enzymes  Recent Labs Lab 10/01/16 0226 10/02/16 0304 10/03/16 0340  AST 29 22 28   ALT 26 21 23   ALKPHOS 32* 34* 37*  BILITOT 1.1 0.9 1.5*  ALBUMIN 2.9* 3.1* 3.5    Cardiac Enzymes No results for input(s): TROPONINI, PROBNP in the last 168 hours.  Glucose  Recent Labs Lab 10/03/16 1644 10/03/16 2214 10/04/16 0751 10/04/16 1210 10/04/16 2317 10/05/16 0755  GLUCAP 142* 134* 123* 181* 122* 100*    Imaging Dg Chest 1 View  Result Date: 10/04/2016 CLINICAL DATA:  Status post left thoracentesis. EXAM: CHEST 1 VIEW COMPARISON:  10/04/2016 FINDINGS: Right-sided PICC line tip overlies the superior vena cava. The heart is enlarged. Status post median sternotomy and CABG. There is no pneumothorax following left thoracentesis. There has been improvement in  aeration of the left lung base. Small left pleural effusion remains and there is persistent opacity in the medial left lower lobe. Persistent right lung base opacity and pleural effusion. IMPRESSION: No pneumothorax following left thoracentesis. Improved aeration in the left lower lobe. Electronically Signed   By: Nolon Nations M.D.   On: 10/04/2016 10:56   Dg Chest Port 1 View  Result Date: 10/05/2016 CLINICAL DATA:  Shortness of breath.  Pleural effusion . EXAM: PORTABLE CHEST 1 VIEW COMPARISON:  Chest x-ray 10/04/2016. FINDINGS: Right PICC line stable position. Prior CABG. Persistent cardiomegaly with bilateral pulmonary infiltrates/edema and small pleural effusions. Findings consistent CHF. Interim slight worsening from prior exam. Pneumothorax . IMPRESSION: 1. Right PICC line stable position. 2. Prior CABG. Cardiomegaly with bilateral pulmonary infiltrates/ edema and bilateral small pleural effusions. Findings consistent with CHF. Interim slight worsening from prior exam. Electronically Signed   By: Marcello Moores  Register   On: 10/05/2016 07:36   US Thoracentesis Asp Pleural Space W/img Guide  Result Date: 10/04/2016 INDICATION: Postop CABG. Bilateral pleural effusions. Request therapeutic thoracentesis. EXAM: ULTRASOUND GUIDED LEFT THORACENTESIS MEDICATIONS: None. COMPLICATIONS: None immediate. Postprocedural chest x-ray negative for pneumothorax. PROCEDURE: An ultrasound guided thoracentesis was thoroughly discussed with the patient and questions answered. The benefits, risks, alternatives and complications were also discussed. The patient understands and wishes to proceed with the procedure. Written consent was obtained. Ultrasound of the chest finds bilateral moderate-sized pleural effusions. Left side chosen for thoracentesis today. Patient may benefit from right-sided thoracentesis as well. Ultrasound was performed to localize and mark an adequate pocket of fluid in the left chest. The area was then  prepped and draped in the normal sterile fashion. 1% Lidocaine was used for local anesthesia. Under ultrasound guidance a Safe-T-Centesis catheter was introduced. Thoracentesis was performed. The catheter was removed and a dressing applied. FINDINGS: A total of approximately 800 mL of clear, dark amber colored fluid was removed. IMPRESSION: Successful ultrasound guided left thoracentesis yielding 100 mL of pleural fluid. Right-sided pleural effusion. Read by: Ascencion Dike PA-C Electronically Signed   By: Corrie Mckusick D.O.   On: 10/04/2016 10:58     STUDIES:  ECHO 6/14> LVEF 65-70%, small effusion, hyperdynamic LV CT Chest 6/15/>> Small bilateral pleural effusions, right greater than left, with partial consolidation of both lower lung lobes. Underlying pneumonia CXR 6/17>> Slight increase in effusion on the right  CULTURES:   ANTIBIOTICS: Zosyn 6/14>>>   SIGNIFICANT EVENTS:   LINES/TUBES:   DISCUSSION: 71 year old male s/p 4 V CABG 6/11. PCCM asked to see 6/14 for intermittent hypoxia and presumed laryngospasm. He was started on BiPAP with some improvement with plans for  continued diuresis and treatment of possible PNA and was moved to ICU for closer monitoring. 6/15 he was able to come off BiPAP for some long stretches, but still requiring it PRN.   ASSESSMENT / PLAN:  Acute hypoxic respiratory failure likely multifactorial in setting transient Laryngospasm +/-bronchospasm superimposed on element of pulmonary edema/volume overload. Can't exclude aspiration event as he has had some vomiting. Improving slowly.   Bilateral pleural effusions - s/p L thora 6/17 Plan Continue lasix BID as BUN/cr allows Empiric zosyn (day 4) , trend PCT. Plan 5- 7  day course PPI BID  Continue scheduled/ prn  Nebs Titrate/ wean  HFNC for saturations 88-92% Mobilize, walk in hall Aggressive pulmonary hygiene  Sputum culture if able Mucinex  Repeat Thora on R today - send pleural studies    CAD now  s/p 4V CABG No CHF based on echo. AF w/ RVR Plan:  Cont telemetry  Management per primary (currently on milrinone and amiodarone) Continue diuresis as renal function allows  Mild AKI Hyponatremia Plan Strict intake/output Follow BMP Correct/ replete electrolytes as indicated Monitor closely with diuresis   Infection: Leukocytosis Afebrile Clinically asymptomatic  Plan: Trend CBC and Fever Curve Sputum for culture if able Pan Culture if clinical S/S    GERD, gastric distention, N&V. No ileus on KUB 6/14 Having soft BM's per patient and nursing staff Plan Bowel rest PPI max Scheduled reglan Zofran PRN   Acute blood loss anemia No obvious source of bleeding Plan Trend cbc/ platelets Trend Fever Curve/ WBC Monitor for bleeding Transfuse per protocol  Hold Lovenox for possible thoracentesis today   Hyperglycemia Plan CBG's  SSI  Can tx to SDU from PCCM standpoint.   Nickolas Madrid, NP 10/05/2016  9:38 AM Pager: [phone] or [phone]

## 2016-10-06 ENCOUNTER — Inpatient Hospital Stay (HOSPITAL_COMMUNITY): Payer: BLUE CROSS/BLUE SHIELD

## 2016-10-06 ENCOUNTER — Encounter (HOSPITAL_COMMUNITY): Payer: Self-pay | Admitting: Student

## 2016-10-06 DIAGNOSIS — I479 Paroxysmal tachycardia, unspecified: Secondary | ICD-10-CM

## 2016-10-06 DIAGNOSIS — I25119 Atherosclerotic heart disease of native coronary artery with unspecified angina pectoris: Secondary | ICD-10-CM

## 2016-10-06 DIAGNOSIS — Z951 Presence of aortocoronary bypass graft: Secondary | ICD-10-CM

## 2016-10-06 HISTORY — PX: IR THORACENTESIS ASP PLEURAL SPACE W/IMG GUIDE: IMG5380

## 2016-10-06 LAB — CBC
HCT: 31.1 % — ABNORMAL LOW (ref 39.0–52.0)
Hemoglobin: 10.1 g/dL — ABNORMAL LOW (ref 13.0–17.0)
MCH: 30.1 pg (ref 26.0–34.0)
MCHC: 32.5 g/dL (ref 30.0–36.0)
MCV: 92.8 fL (ref 78.0–100.0)
Platelets: 238 10*3/uL (ref 150–400)
RBC: 3.35 MIL/uL — ABNORMAL LOW (ref 4.22–5.81)
RDW: 16 % — ABNORMAL HIGH (ref 11.5–15.5)
WBC: 12.5 10*3/uL — ABNORMAL HIGH (ref 4.0–10.5)

## 2016-10-06 LAB — GLUCOSE, CAPILLARY
GLUCOSE-CAPILLARY: 92 mg/dL (ref 65–99)
GLUCOSE-CAPILLARY: 115 mg/dL — AB (ref 65–99)
Glucose-Capillary: 132 mg/dL — ABNORMAL HIGH (ref 65–99)
Glucose-Capillary: 95 mg/dL (ref 65–99)
Glucose-Capillary: 90 mg/dL (ref 65–99)

## 2016-10-06 MED ORDER — FUROSEMIDE 40 MG PO TABS
40.0000 mg | ORAL_TABLET | Freq: Every day | ORAL | Status: DC
  Administered 2016-10-07 – 2016-10-09 (×3): 40 mg via ORAL
  Filled 2016-10-06 (×4): qty 1

## 2016-10-06 MED ORDER — WARFARIN SODIUM 5 MG PO TABS
5.0000 mg | ORAL_TABLET | Freq: Every day | ORAL | Status: AC
  Administered 2016-10-06: 5 mg via ORAL
  Filled 2016-10-06: qty 1

## 2016-10-06 MED ORDER — LIDOCAINE HCL (PF) 1 % IJ SOLN
INTRAMUSCULAR | Status: DC | PRN
  Administered 2016-10-06: 10 mL

## 2016-10-06 MED ORDER — FUROSEMIDE 10 MG/ML IJ SOLN
40.0000 mg | Freq: Every day | INTRAMUSCULAR | Status: AC
  Administered 2016-10-06: 40 mg via INTRAVENOUS
  Filled 2016-10-06: qty 4

## 2016-10-06 MED ORDER — LIDOCAINE HCL (PF) 1 % IJ SOLN
INTRAMUSCULAR | Status: AC
  Filled 2016-10-06: qty 30

## 2016-10-06 MED ORDER — WARFARIN - PHYSICIAN DOSING INPATIENT: Freq: Every day | Status: DC

## 2016-10-06 MED ORDER — AMIODARONE HCL 200 MG PO TABS
400.0000 mg | ORAL_TABLET | Freq: Every day | ORAL | Status: DC
  Administered 2016-10-06 – 2016-10-08 (×3): 400 mg via ORAL
  Filled 2016-10-06 (×3): qty 2

## 2016-10-06 NOTE — Progress Notes (Signed)
Progress Note  Patient Name: Ernest Booker Date of Encounter: 10/06/2016  Primary Cardiologist: Dr. Agustin Cree  Subjective   Had thoracentesis today.  HR improved.  Inpatient Medications    Scheduled Meds: . amiodarone  400 mg Oral Daily  . aspirin EC  325 mg Oral Daily   Or  . aspirin  324 mg Per Tube Daily  . atorvastatin  80 mg Oral q1800  . Chlorhexidine Gluconate Cloth  6 each Topical Daily  . enoxaparin (LOVENOX) injection  40 mg Subcutaneous Q24H  . [START ON 10/07/2016] furosemide  40 mg Oral Daily  . guaiFENesin  1,200 mg Oral BID  . insulin aspart  0-15 Units Subcutaneous TID WC  . insulin aspart  0-5 Units Subcutaneous QHS  . levalbuterol  1.25 mg Nebulization TID  . mouth rinse  15 mL Mouth Rinse BID  . metoCLOPramide (REGLAN) injection  10 mg Intravenous Q6H  . metoprolol tartrate  12.5 mg Oral BID  . pantoprazole  40 mg Oral BID  . sodium chloride flush  3 mL Intravenous Q12H  . warfarin  5 mg Oral q1800  . Warfarin - Physician Dosing Inpatient   Does not apply q1800   Continuous Infusions: . sodium chloride Stopped (09/29/16 1100)  . sodium chloride    . sodium chloride Stopped (09/29/16 1100)  . sodium chloride    . piperacillin-tazobactam (ZOSYN)  IV 3.375 g (10/06/16 1156)  . potassium chloride     PRN Meds: sodium chloride, Place/Maintain arterial line **AND** sodium chloride, levalbuterol, lidocaine (PF), metoprolol tartrate, ondansetron (ZOFRAN) IV, oxyCODONE, potassium chloride, promethazine, sodium chloride flush, sodium chloride flush, traMADol, zolpidem   Vital Signs    Vitals:   10/06/16 0929 10/06/16 1100 10/06/16 1134 10/06/16 1314  BP: 138/75   133/83  Pulse: 69 70  (!) 102  Resp: 18 19  (!) 22  Temp:   98.2 F (36.8 C)   TempSrc:   Oral   SpO2: 97% 97%  97%  Weight:      Height:        Intake/Output Summary (Last 24 hours) at 10/06/16 1342 Last data filed at 10/06/16 1159  Gross per 24 hour  Intake          1381.76 ml    Output             1080 ml  Net           301.76 ml   Filed Weights   10/03/16 0600 10/04/16 0500 10/05/16 0900  Weight: 212 lb 15.4 oz (96.6 kg) 211 lb 6.7 oz (95.9 kg) 208 lb 6.4 oz (94.5 kg)    Telemetry    ? Atrial tach with variable block - Personally Reviewed  ECG    No recent - Personally Reviewed  Physical Exam   GEN: No acute distress.   Neck: No JVD Cardiac: irregualrly irregular, no murmurs, rubs, or gallops.  Respiratory: Clear to auscultation bilaterally. GI: Soft, nontender, non-distended  MS: No edema; No deformity. Neuro:  Nonfocal  Psych: Normal affect   Labs    Chemistry Recent Labs Lab 10/01/16 0226  10/02/16 0304  10/03/16 0340  10/05/16 0342 10/05/16 0605 10/05/16 1856  NA 132*  < > 130*  < > 133*  < > 133* 134* 133*  K 3.7  < > 4.2  < > 3.8  < > 7.3* 3.5 3.7  CL 97*  < > 94*  < > 97*  < > 100* 99* 98*  CO2 27  --  25  --  26  < > 28 27 27   GLUCOSE 169*  < > 170*  < > 145*  < > 171* 110* 139*  BUN 14  < > 24*  < > 32*  < > 43* 43* 38*  CREATININE 1.38*  < > 1.71*  < > 1.63*  < > 1.65* 1.61* 1.65*  CALCIUM 7.9*  --  8.2*  --  8.5*  < > 8.0* 8.0* 8.3*  PROT 5.2*  --  5.3*  --  6.2*  --   --   --   --   ALBUMIN 2.9*  --  3.1*  --  3.5  --   --   --   --   AST 29  --  22  --  28  --   --   --   --   ALT 26  --  21  --  23  --   --   --   --   ALKPHOS 32*  --  34*  --  37*  --   --   --   --   BILITOT 1.1  --  0.9  --  1.5*  --   --   --   --   GFRNONAA 51*  --  39*  --  42*  < > 41* 42* 41*  GFRAA 59*  --  46*  --  48*  < > 48* 49* 48*  ANIONGAP 8  --  11  --  10  < > 5 8 8   < > = values in this interval not displayed.   Hematology Recent Labs Lab 10/04/16 0347 10/04/16 1618 10/05/16 0342 10/06/16 0214  WBC 15.7*  --  11.6* 12.5*  RBC 3.03*  --  3.18* 3.35*  HGB 9.0* 12.2* 9.4* 10.1*  HCT 27.1* 36.0* 29.0* 31.1*  MCV 89.4  --  91.2 92.8  MCH 29.7  --  29.6 30.1  MCHC 33.2  --  32.4 32.5  RDW 15.4  --  15.7* 16.0*  PLT 223  --   212 238    Cardiac EnzymesNo results for input(s): TROPONINI in the last 168 hours. No results for input(s): TROPIPOC in the last 168 hours.   BNPNo results for input(s): BNP, PROBNP in the last 168 hours.   DDimer No results for input(s): DDIMER in the last 168 hours.   Radiology    Dg Chest 1 View  Result Date: 10/06/2016 CLINICAL DATA:  Status post thoracentesis EXAM: CHEST 1 VIEW COMPARISON:  10/06/2016 FINDINGS: Cardiomegaly is again identified. Postsurgical changes are seen. Right-sided PICC line is noted in satisfactory position. Interval right-sided thoracentesis has been performed without evidence of pneumothorax. Significant reduction in right pleural fluid is noted. Mild bibasilar atelectatic changes remain. IMPRESSION: No pneumothorax following right thoracentesis. Electronically Signed   By: Inez Catalina M.D.   On: 10/06/2016 10:38   Dg Chest Port 1 View  Result Date: 10/06/2016 CLINICAL DATA:  Shortness of breath. EXAM: PORTABLE CHEST 1 VIEW COMPARISON:  10/05/2016. FINDINGS: Right PICC line stable position. Prior CABG. Stable cardiomegaly. Persistent right base infiltrate and small right pleural effusion. Continued improvement of aeration of left lung base. No pneumothorax . IMPRESSION: Right PICC line stable position. 2. Persistent right base infiltrate and right pleural effusion. Improved aeration left lung base. 3.  Prior CABG.  Stable cardiomegaly. Electronically Signed   By: Marcello Moores  Register   On: 10/06/2016 07:20  Dg Chest Port 1 View  Result Date: 10/05/2016 CLINICAL DATA:  Shortness of breath.  Pleural effusion . EXAM: PORTABLE CHEST 1 VIEW COMPARISON:  Chest x-ray 10/04/2016. FINDINGS: Right PICC line stable position. Prior CABG. Persistent cardiomegaly with bilateral pulmonary infiltrates/edema and small pleural effusions. Findings consistent CHF. Interim slight worsening from prior exam. Pneumothorax . IMPRESSION: 1. Right PICC line stable position. 2. Prior CABG.  Cardiomegaly with bilateral pulmonary infiltrates/ edema and bilateral small pleural effusions. Findings consistent with CHF. Interim slight worsening from prior exam. Electronically Signed   By: Marcello Moores  Register   On: 10/05/2016 07:36   Ir Thoracentesis Asp Pleural Space W/img Guide  Result Date: 10/06/2016 INDICATION: Patient with history of CHF post bypass, now with acute hypoxic respiratory failure and right pleural effusion. Request is made for therapeutic thoracentesis. EXAM: ULTRASOUND GUIDED THERAPEUTIC RIGHT THORACENTESIS MEDICATIONS: 10 mL 1% lidocaine COMPLICATIONS: None immediate. PROCEDURE: An ultrasound guided thoracentesis was thoroughly discussed with the patient and questions answered. The benefits, risks, alternatives and complications were also discussed. The patient understands and wishes to proceed with the procedure. Written consent was obtained. Ultrasound was performed to localize and mark an adequate pocket of fluid in the right chest. The area was then prepped and draped in the normal sterile fashion. 1% Lidocaine was used for local anesthesia. Under ultrasound guidance a Safe-T-centesis catheter was introduced. Thoracentesis was performed. The catheter was removed and a dressing applied. FINDINGS: A total of approximately 600 mL of amber fluid was removed. IMPRESSION: Successful ultrasound guided therapeutic right thoracentesis yielding 600 mL of pleural fluid. Read by:  Brynda Greathouse PA-C Electronically Signed   By: Marybelle Killings M.D.   On: 10/06/2016 10:32    Cardiac Studies     Patient Profile     71 y.o. male s/p CABG with arhythmia  Assessment & Plan    Check 12 lead ECG to help determine rhythm.  For now, AV nodal blocking agents to slow rate.  If he does have AFib, would have to consider anticoagulation.  Would wait until no further procedures planned.   Signed, Larae Grooms, MD  10/06/2016, 1:42 PM

## 2016-10-06 NOTE — Progress Notes (Signed)
Physical Therapy Treatment Patient Details Name: Ernest Booker MRN: [id number] DOB: 1945/08/11 Today's Date: 10/06/2016    History of Present Illness Pt adm with NSTEMI and underwent CABG x 4 on 6/11. Pt developed respiratory failure on 6/14 and required bipap. PMH - HTN    PT Comments    Pt admitted with above diagnosis. Pt currently with functional limitations due to balance and endurance deficits. Pt was able to ambulate unit again.  Sats >90% on 2LO2 with pursed lip breathing.  Progressing well.   Pt will benefit from skilled PT to increase their independence and safety with mobility to allow discharge to the venue listed below.     Follow Up Recommendations  Home health PT;Supervision/Assistance - 24 hour     Equipment Recommendations  Other (comment) (To be assessed)    Recommendations for Other Services       Precautions / Restrictions Precautions Precautions: Sternal;Fall Precaution Comments: watch SpO2 Restrictions Weight Bearing Restrictions: Yes (Sternal )    Mobility  Bed Mobility               General bed mobility comments: on 3N1 on arrival  Transfers Overall transfer level: Needs assistance Equipment used: Rolling walker (2 wheeled) Transfers: Sit to/from [employer] Transfers Sit to Stand: +2 physical assistance;Min guard         General transfer comment: Verbal cues for technique and to follow sternal precautions.  Pt on 2L of O2. SpO2  on 2LO2 88-94% which stayed >90%  if pt performed pursed lip breathing. 89-120 bpm.    Ambulation/Gait Ambulation/Gait assistance: +2 safety/equipment;Min guard Ambulation Distance (Feet): 700 Feet Assistive device: Rolling walker (2 wheeled) Gait Pattern/deviations: Step-through pattern;Decreased stride length;Drifts right/left;Trunk flexed   Gait velocity interpretation: Below normal speed for age/gender General Gait Details: Pt generally steady with RW.  Ambulated entire unit needing standing rest  breaks and breathing techniques.     Stairs            Wheelchair Mobility    Modified Rankin (Stroke Patients Only)       Balance Overall balance assessment: Needs assistance Sitting-balance support: No upper extremity supported;Feet supported Sitting balance-Leahy Scale: Good     Standing balance support: Bilateral upper extremity supported Standing balance-Leahy Scale: Poor Standing balance comment: walker and min guard for static standing                            Cognition Arousal/Alertness: Awake/alert Behavior During Therapy: WFL for tasks assessed/performed Overall Cognitive Status: Within Functional Limits for tasks assessed                                        Exercises      General Comments        Pertinent Vitals/Pain Pain Assessment: No/denies pain    Home Living                      Prior Function            PT Goals (current goals can now be found in the care plan section) Acute Rehab PT Goals Patient Stated Goal: go home Progress towards PT goals: Progressing toward goals    Frequency    Min 3X/week      PT Plan Current plan remains appropriate    Co-evaluation  AM-PAC PT "6 Clicks" Daily Activity  Outcome Measure  Difficulty turning over in bed (including adjusting bedclothes, sheets and blankets)?: Total Difficulty moving from lying on back to sitting on the side of the bed? : Total Difficulty sitting down on and standing up from a chair with arms (e.g., wheelchair, bedside commode, etc,.)?: A Little Help needed moving to and from a bed to chair (including a wheelchair)?: A Little Help needed walking in hospital room?: A Little Help needed climbing 3-5 steps with a railing? : A Lot 6 Click Score: 13    End of Session Equipment Utilized During Treatment: Oxygen;Gait belt Activity Tolerance: Patient limited by fatigue Patient left: in chair;with call bell/phone  within reach;with chair alarm set Nurse Communication: Mobility status PT Visit Diagnosis: Unsteadiness on feet (R26.81);Difficulty in walking, not elsewhere classified (R26.2)     Time: 7943-2761 PT Time Calculation (min) (ACUTE ONLY): 24 min  Charges:  $Gait Training: 23-37 mins                    G Codes:       Ernest Booker,PT Acute Rehabilitation [phone] (pager)    Ernest Booker 10/06/2016, 12:32 PM

## 2016-10-06 NOTE — Progress Notes (Signed)
[facility] Attending Note  ADMISSION DATE:09/24/2016  CONSULTATION DATE:10/01/2016  REFERRING MD:Van Trigt, M.D. / [facility]  CHIEF COMPLAINT:Acute Hypoxic Respiratory Failure  Presenting HPI:  71 y.o. male with hypertension and hyperlipidemia transferred to our hospital for chest pain with multivessel coronary artery disease sitting on left heart catheterization. Patient underwent four-vessel bypass on 6/11 and was extubated on 6/12. Patient had increased audible wheezing with ambulation on 6/13. Developed intermittent nausea and vomiting the morning of 6/14. Diuresis was initiated by primary service. Later on 6/14 patient was notably desaturated to 89% on 6 L/m. Nebulizer treatments were started but given patient's tenuous respiratory status pulmonary medicine was called for consultation. Patient was started on noninvasive positive pressure ventilation.  Subjective:  No acute events overnight. Patient reports minimal coughing. No new chest pain or pressure. Denies any new dyspnea.  Review of Systems:  Denies any abdominal pain or nausea. No subjective fever or chills.  Temp:  [97.9 F (36.6 C)-98.7 F (37.1 C)] 97.9 F (36.6 C) (06/19 0754) Pulse Rate:  [41-128] 67 (06/19 0800) Resp:  [10-23] 16 (06/19 0800) BP: (101-134)/(60-93) 124/61 (06/19 0800) SpO2:  [92 %-100 %] 98 % (06/19 0800) Weight:  [208 lb 6.4 oz (94.5 kg)] 208 lb 6.4 oz (94.5 kg) (06/18 0900)  General:  Napping in chair. No distress. Alert. Integument:  Warm & dry. No rash or bruising on exposed skin. HEENT:  No scleral icterus or injection. Pupils symmetric.  Pulmonary:  Slightly decreased breath sounds bilateral lung bases. Normal breathing on high flow nasal cannula oxygen.  Cardiovascular:  Regular rate. No JVD apprecaited given positioning. Trace lower extremity edema. Abdomen:  Soft. Protuberant. Nontender. Normal bowel sounds.  CBC Latest Ref Rng & Units 10/06/2016 10/05/2016 10/04/2016   WBC 4.0 - 10.5 K/uL 12.5(H) 11.6(H) -  Hemoglobin 13.0 - 17.0 g/dL 10.1(L) 9.4(L) 12.2(L)  Hematocrit 39.0 - 52.0 % 31.1(L) 29.0(L) 36.0(L)  Platelets 150 - 400 K/uL 238 212 -   BMP Latest Ref Rng & Units 10/05/2016 10/05/2016 10/05/2016  Glucose 65 - 99 mg/dL 139(H) 110(H) 171(H)  BUN 6 - 20 mg/dL 38(H) 43(H) 43(H)  Creatinine 0.61 - 1.24 mg/dL 1.65(H) 1.61(H) 1.65(H)  Sodium 135 - 145 mmol/L 133(L) 134(L) 133(L)  Potassium 3.5 - 5.1 mmol/L 3.7 3.5 7.3(HH)  Chloride 101 - 111 mmol/L 98(L) 99(L) 100(L)  CO2 22 - 32 mmol/L 27 27 28   Calcium 8.9 - 10.3 mg/dL 8.3(L) 8.0(L) 8.0(L)    IMAGING/STUDIES: PFT 09/25/16: FVC 3.19 L (83%) FEV1 2.16 L (76%) FEV1/FVC 0.68 FEF 25-75 4.13 L (51%) negative bronchodilator response TTE 6/14:  LV normal in size with EF 65-70%. LA mildly dilated & RA normal in size. RV normal in size and function. No aortic stenosis or regurgitation. Aortic root normal in size. No significant mitral regurgitation. Mild pulmonic and tricuspid regurgitation. Small posterior pericardial effusion. CT CHEST W/O 6/15:  Small bilateral pleural effusions, right greater than left, with partial consolidation of both lower lung lobes. Underlying pneumonia is a concern. Scattered coronary artery calcifications seen. PORT CXR 6/18:  Previously reviewed by me. Right-sided PICC line in good position. Persistent bilateral lower lung predominant opacities unchanged. PORT CXR 6/19:  Personally reviewed by me. Right upper extremity PICC line in place. Persistent bilateral lower lung opacities right greater than left with fluid still present within right fissure. New blunting of left costal cardiac echo suggestive of increasing effusion size.  MICROBIOLOGY: MRSA PCR 6/8:  Negative   ANTIBIOTICS: Zosyn 6/14 >>>  ASSESSMENT/PLAN:  71 y.o. male with acute hypoxic respiratory failure post bypass. Hypoxia is likely multifactorial from not only fluid overload but also some element of atelectasis. Low  suspicion for infectious etiology. Patient to undergo thoracentesis today & is planned for transition to stepdown unit.  1. Acute hypoxic respiratory failure: Patient counseled on appropriate technique and frequency with incentive spirometry. Continuing out of bed to chair to minimize atelectasis as well. Continuing diuresis as renal function and blood pressure allow. 2. Bilateral pleural effusions: Status post left thoracentesis 6/17. Plan for right thoracentesis with pleural fluid analysis. Suspect secondary to volume overload. 3. Possible healthcare associated pneumonia: Currently on day #6/7 of empiric Zosyn. Plan for pleural fluid culture during thoracentesis today. 4. Possible bronchospasm: Previous spirometry showed mild airway obstruction. Continuing Xopenex scheduled 3 times a day. No signs of COPD exacerbation at this time.  I have spent a total of 29 minutes of time today caring for the patient, reviewing the patient's electronic medical record, and with more than 50% of that time spent coordinating care with the patient as well as reviewing the continuing plan of care with the patient at bedside.  Remainder of care as per primary service & other consultants.   Sonia Baller Ashok Cordia, M.D. [facility] Pager:  [phone] After 3pm or if no response, call [phone] 8:39 AM 10/06/16

## 2016-10-06 NOTE — Progress Notes (Signed)
8 Days Post-Op Procedure(s) (LRB): CORONARY ARTERY BYPASS GRAFTING (CABG) x four, using left internal mammary artery and right leg greater saphenous vein harvested endoscopically (N/A) TRANSESOPHAGEAL ECHOCARDIOGRAM (TEE) (N/A) Subjective: Back in nsr Walked in hall  CXR with R effusion Plan R thoracentesis then tx to stepdown Amiodarone to po dosing Objective: Vital signs in last 24 hours: Temp:  [97.9 F (36.6 C)-98.7 F (37.1 C)] 97.9 F (36.6 C) (06/19 0754) Pulse Rate:  [41-128] 84 (06/19 0700) Cardiac Rhythm: Normal sinus rhythm (06/19 0600) Resp:  [10-23] 18 (06/19 0700) BP: (101-134)/(60-93) 105/91 (06/19 0700) SpO2:  [92 %-100 %] 95 % (06/19 0700) Weight:  [208 lb 6.4 oz (94.5 kg)] 208 lb 6.4 oz (94.5 kg) (06/18 0900)  Hemodynamic parameters for last 24 hours: CVP:  [20 mmHg] 20 mmHg  Intake/Output from previous day: 06/18 0701 - 06/19 0700 In: 2642 [P.O.:960; I.V.:1582; IV Piggyback:100] Out: 2005 [Urine:2005] Intake/Output this shift: No intake/output data recorded.       Exam    General- alert and comfortable   Lungs- clear without rales, wheezes   Cor- regular rate and rhythm, no murmur , gallop   Abdomen- soft, non-tender   Extremities - warm, non-tender, minimal edema   Neuro- oriented, appropriate, no focal weakness   Lab Results:  Recent Labs  10/05/16 0342 10/06/16 0214  WBC 11.6* 12.5*  HGB 9.4* 10.1*  HCT 29.0* 31.1*  PLT 212 238   BMET:  Recent Labs  10/05/16 0605 10/05/16 1856  NA 134* 133*  K 3.5 3.7  CL 99* 98*  CO2 27 27  GLUCOSE 110* 139*  BUN 43* 38*  CREATININE 1.61* 1.65*  CALCIUM 8.0* 8.3*    PT/INR: No results for input(s): LABPROT, INR in the last 72 hours. ABG    Component Value Date/Time   PHART 7.492 (H) 10/05/2016 0445   HCO3 27.0 10/05/2016 0445   TCO2 29 10/04/2016 1618   ACIDBASEDEF 4.0 (H) 09/28/2016 2025   O2SAT 95.5 10/05/2016 0445   CBG (last 3)   Recent Labs  10/05/16 0755 10/05/16 1223  10/05/16 2148  GLUCAP 100* 97 97    Assessment/Plan: S/P Procedure(s) (LRB): CORONARY ARTERY BYPASS GRAFTING (CABG) x four, using left internal mammary artery and right leg greater saphenous vein harvested endoscopically (N/A) TRANSESOPHAGEAL ECHOCARDIOGRAM (TEE) (N/A) Mobilize Diuresis Diabetes control Plan for transfer to step-down: see transfer orders   LOS: 12 days    Tharon Aquas Trigt III 10/06/2016

## 2016-10-06 NOTE — Procedures (Signed)
PROCEDURE SUMMARY:  Successful US guided right therapeutic thoracentesis. Yielded 600 mL of amber fluid. Pt tolerated procedure well. No immediate complications.  Specimen was not sent for labs. CXR ordered.  Docia Barrier PA-C 10/06/2016 10:22 AM

## 2016-10-07 ENCOUNTER — Inpatient Hospital Stay (HOSPITAL_COMMUNITY): Payer: BLUE CROSS/BLUE SHIELD

## 2016-10-07 LAB — BASIC METABOLIC PANEL
Anion gap: 7 (ref 5–15)
BUN: 28 mg/dL — ABNORMAL HIGH (ref 6–20)
CO2: 29 mmol/L (ref 22–32)
Calcium: 8.3 mg/dL — ABNORMAL LOW (ref 8.9–10.3)
Chloride: 98 mmol/L — ABNORMAL LOW (ref 101–111)
Creatinine, Ser: 1.36 mg/dL — ABNORMAL HIGH (ref 0.61–1.24)
GFR calc Af Amer: >60 mL/min (ref >60)
GFR calc non Af Amer: 52 mL/min — ABNORMAL LOW (ref >60)
Glucose, Bld: 96 mg/dL (ref 65–99)
Potassium: 3.7 mmol/L (ref 3.5–5.1)
Sodium: 134 mmol/L — ABNORMAL LOW (ref 135–145)

## 2016-10-07 LAB — CBC
HCT: 31.9 % — ABNORMAL LOW (ref 39.0–52.0)
Hemoglobin: 10.4 g/dL — ABNORMAL LOW (ref 13.0–17.0)
MCH: 29.8 pg (ref 26.0–34.0)
MCHC: 32.6 g/dL (ref 30.0–36.0)
MCV: 91.4 fL (ref 78.0–100.0)
Platelets: 239 10*3/uL (ref 150–400)
RBC: 3.49 MIL/uL — ABNORMAL LOW (ref 4.22–5.81)
RDW: 15.7 % — ABNORMAL HIGH (ref 11.5–15.5)
WBC: 11.5 10*3/uL — ABNORMAL HIGH (ref 4.0–10.5)

## 2016-10-07 LAB — PROTIME-INR
INR: 1.27
Prothrombin Time: 15.9 seconds — ABNORMAL HIGH (ref 11.4–15.2)

## 2016-10-07 LAB — GLUCOSE, CAPILLARY: GLUCOSE-CAPILLARY: 85 mg/dL (ref 65–99)

## 2016-10-07 MED ORDER — COUMADIN BOOK
Freq: Once | Status: AC
  Administered 2016-10-07: 12:00:00
  Filled 2016-10-07: qty 1

## 2016-10-07 MED ORDER — POTASSIUM CHLORIDE CRYS ER 20 MEQ PO TBCR
40.0000 meq | EXTENDED_RELEASE_TABLET | Freq: Every day | ORAL | Status: DC
  Administered 2016-10-07 – 2016-10-09 (×3): 40 meq via ORAL
  Filled 2016-10-07 (×4): qty 2

## 2016-10-07 MED ORDER — METOPROLOL TARTRATE 25 MG PO TABS
25.0000 mg | ORAL_TABLET | Freq: Two times a day (BID) | ORAL | Status: DC
  Administered 2016-10-07: 25 mg via ORAL
  Filled 2016-10-07: qty 1

## 2016-10-07 MED ORDER — METOPROLOL TARTRATE 50 MG PO TABS
50.0000 mg | ORAL_TABLET | Freq: Two times a day (BID) | ORAL | Status: DC
  Administered 2016-10-07 – 2016-10-08 (×3): 50 mg via ORAL
  Filled 2016-10-07 (×3): qty 1

## 2016-10-07 MED ORDER — WARFARIN SODIUM 5 MG PO TABS
5.0000 mg | ORAL_TABLET | Freq: Every day | ORAL | Status: DC
  Administered 2016-10-07 – 2016-10-08 (×2): 5 mg via ORAL
  Filled 2016-10-07 (×2): qty 1

## 2016-10-07 MED ORDER — WARFARIN VIDEO
Freq: Once | Status: AC
  Administered 2016-10-07: 12:00:00

## 2016-10-07 NOTE — Discharge Instructions (Addendum)
Discharge instructions:  1. Please draw PT/INR every 48 hours, fax results to [phone] attn: Coumadin clinic   Coronary Artery Bypass Grafting, Care After This sheet gives you information about how to care for yourself after your procedure. Your health care provider may also give you more specific instructions. If you have problems or questions, contact your health care provider. What can I expect after the procedure? After the procedure, it is common to have:  Nausea and a lack of appetite.  Constipation.  Weakness and fatigue.  Depression or irritability.  Pain or discomfort in your incision areas.  Follow these instructions at home: Medicines  Take over-the-counter and prescription medicines only as told by your health care provider. Do not stop taking medicines or start any new medicines without approval from your health care provider.  If you were prescribed an antibiotic medicine, take it as told by your health care provider. Do not stop taking the antibiotic even if you start to feel better.  Do not drive or use heavy machinery while taking prescription pain medicine. Incision care  Follow instructions from your health care provider about how to take care of your incisions. Make sure you: ? Wash your hands with soap and water before you change your bandage (dressing). If soap and water are not available, use hand sanitizer. ? Change your dressing as told by your health care provider. ? Leave stitches (sutures), skin glue, or adhesive strips in place. These skin closures may need to stay in place for 2 weeks or longer. If adhesive strip edges start to loosen and curl up, you may trim the loose edges. Do not remove adhesive strips completely unless your health care provider tells you to do that.  Keep incision areas clean, dry, and protected.  Check your incision areas every day for signs of infection. Check for: ? More redness, swelling, or pain. ? More fluid or  blood. ? Warmth. ? Pus or a bad smell.  If incisions were made in your legs: ? Avoid crossing your legs. ? Avoid sitting for long periods of time. Change positions every 30 minutes. ? Raise (elevate) your legs when you are sitting. Bathing  Do not take baths, swim, or use a hot tub until your health care provider approves.  Only take sponge baths. Pat the incisions dry. Do not rub incisions with a washcloth or towel.  Ask your health care provider when you can shower. Eating and drinking  Eat foods that are high in fiber, such as raw fruits and vegetables, whole grains, beans, and nuts. Meats should be lean cut. Avoid canned, processed, and fried foods. This can help prevent constipation and is a recommended part of a heart-healthy diet.  Drink enough fluid to keep your urine clear or pale yellow.  Limit alcohol intake to no more than 1 drink a day for nonpregnant women and 2 drinks a day for men. One drink equals 12 oz of beer, 5 oz of wine, or 1 oz of hard liquor. Activity  Rest and limit your activity as told by your health care provider. You may be instructed to: ? Stop any activity right away if you have chest pain, shortness of breath, irregular heartbeats, or dizziness. Get help right away if you have any of these symptoms. ? Move around frequently for short periods or take short walks as directed by your health care provider. Gradually increase your activities. You may need physical therapy or cardiac rehabilitation to help strengthen your muscles and build  your endurance. ? Avoid lifting, pushing, or pulling anything that is heavier than 10 lb (4.5 kg) for at least 6 weeks or as told by your health care provider.  Do not drive until your health care provider approves.  Ask your health care provider when you may return to work.  Ask your health care provider when you may resume sexual activity. General instructions  Do not use any products that contain nicotine or  tobacco, such as cigarettes and e-cigarettes. If you need help quitting, ask your health care provider.  Take 2-3 deep breaths every few hours during the day, while you recover. This helps expand your lungs and prevent complications like pneumonia after surgery.  If you were given a device called an incentive spirometer, use it several times a day to practice deep breathing. Support your chest with a pillow or your arms when you take deep breaths or cough.  Wear compression stockings as told by your health care provider. These stockings help to prevent blood clots and reduce swelling in your legs.  Weigh yourself every day. This helps identify if your body is holding (retaining) fluid that may make your heart and lungs work harder.  Keep all follow-up visits as told by your health care provider. This is important. Contact a health care provider if:  You have more redness, swelling, or pain around any incision.  You have more fluid or blood coming from any incision.  Any incision feels warm to the touch.  You have pus or a bad smell coming from any incision  You have a fever.  You have swelling in your ankles or legs.  You have pain in your legs.  You gain 2 lb (0.9 kg) or more a day.  You are nauseous or you vomit.  You have diarrhea. Get help right away if:  You have chest pain that spreads to your jaw or arms.  You are short of breath.  You have a fast or irregular heartbeat.  You notice a "clicking" in your breastbone (sternum) when you move.  You have numbness or weakness in your arms or legs.  You feel dizzy or light-headed. Summary  After the procedure, it is common to have pain or discomfort in the incision areas.  Do not take baths, swim, or use a hot tub until your health care provider approves.  Gradually increase your activities. You may need physical therapy or cardiac rehabilitation to help strengthen your muscles and build your endurance.  Weigh  yourself every day. This helps identify if your body is holding (retaining) fluid that may make your heart and lungs work harder. This information is not intended to replace advice given to you by your health care provider. Make sure you discuss any questions you have with your health care provider. Document Released: 10/24/2004 Document Revised: 02/24/2016 Document Reviewed: 02/24/2016 Elsevier Interactive Patient Education  2018 [place].   Endoscopic Saphenous Vein Harvesting, Care After Refer to this sheet in the next few weeks. These instructions provide you with information about caring for yourself after your procedure. Your health care provider may also give you more specific instructions. Your treatment has been planned according to current medical practices, but problems sometimes occur. Call your health care provider if you have any problems or questions after your procedure. What can I expect after the procedure? After the procedure, it is common to have:  Pain.  Bruising.  Swelling.  Numbness.  Follow these instructions at home: Medicine  Take over-the-counter  and prescription medicines only as told by your health care provider.  Do not drive or operate heavy machinery while taking prescription pain medicine. Incision care   Follow instructions from your health care provider about how to take care of the cut made during surgery (incision). Make sure you: ? Wash your hands with soap and water before you change your bandage (dressing). If soap and water are not available, use hand sanitizer. ? Change your dressing as told by your health care provider. ? Leave stitches (sutures), skin glue, or adhesive strips in place. These skin closures may need to be in place for 2 weeks or longer. If adhesive strip edges start to loosen and curl up, you may trim the loose edges. Do not remove adhesive strips completely unless your health care provider tells you to do that.  Check  your incision area every day for signs of infection. Check for: ? More redness, swelling, or pain. ? More fluid or blood. ? Warmth. ? Pus or a bad smell. General instructions  Raise (elevate) your legs above the level of your heart while you are sitting or lying down.  Do any exercises your health care providers have given you. These may include deep breathing, coughing, and walking exercises.  Do not shower, take baths, swim, or use a hot tub unless told by your health care provider.  Wear your elastic stocking if told by your health care provider.  Keep all follow-up visits as told by your health care provider. This is important. Contact a health care provider if:  Medicine does not help your pain.  Your pain gets worse.  You have new leg bruises or your leg bruises get bigger.  You have a fever.  Your leg feels numb.  You have more redness, swelling, or pain around your incision.  You have more fluid or blood coming from your incision.  Your incision feels warm to the touch.  You have pus or a bad smell coming from your incision. Get help right away if:  Your pain is severe.  You develop pain, tenderness, warmth, redness, or swelling in any part of your leg.  You have chest pain.  You have trouble breathing. This information is not intended to replace advice given to you by your health care provider. Make sure you discuss any questions you have with your health care provider. Document Released: 12/17/2010 Document Revised: 09/12/2015 Document Reviewed: 02/18/2015 Elsevier Interactive Patient Education  2018 [place] on my medicine - Coumadin   (Warfarin)  This medication education was reviewed with me or my healthcare representative as part of my discharge preparation.  Why was Coumadin prescribed for you? Coumadin was prescribed for you because you have a blood clot or a medical condition that can cause an increased risk of forming  blood clots. Blood clots can cause serious health problems by blocking the flow of blood to the heart, lung, or brain. Coumadin can prevent harmful blood clots from forming. As a reminder your indication for Coumadin is:   Stroke Prevention Because Of Atrial Fibrillation  What test will check on my response to Coumadin? While on Coumadin (warfarin) you will need to have an INR test regularly to ensure that your dose is keeping you in the desired range. The INR (international normalized ratio) number is calculated from the result of the laboratory test called prothrombin time (PT).  If an INR APPOINTMENT HAS NOT ALREADY BEEN MADE FOR YOU please schedule an  appointment to have this lab work done by your health care provider within 7 days. Your INR goal is usually a number between:  2 to 3 or your provider may give you a more narrow range like 2-2.5.  Ask your health care provider during an office visit what your goal INR is.  What  do you need to  know  About  COUMADIN? Take Coumadin (warfarin) exactly as prescribed by your healthcare provider about the same time each day.  DO NOT stop taking without talking to the doctor who prescribed the medication.  Stopping without other blood clot prevention medication to take the place of Coumadin may increase your risk of developing a new clot or stroke.  Get refills before you run out.  What do you do if you miss a dose? If you miss a dose, take it as soon as you remember on the same day then continue your regularly scheduled regimen the next day.  Do not take two doses of Coumadin at the same time.  Important Safety Information A possible side effect of Coumadin (Warfarin) is an increased risk of bleeding. You should call your healthcare provider right away if you experience any of the following: ? Bleeding from an injury or your nose that does not stop. ? Unusual colored urine (red or dark brown) or unusual colored stools (red or black). ? Unusual  bruising for unknown reasons. ? A serious fall or if you hit your head (even if there is no bleeding).  Some foods or medicines interact with Coumadin (warfarin) and might alter your response to warfarin. To help avoid this: ? Eat a balanced diet, maintaining a consistent amount of Vitamin K. ? Notify your provider about major diet changes you plan to make. ? Avoid alcohol or limit your intake to 1 drink for women and 2 drinks for men per day. (1 drink is 5 oz. wine, 12 oz. beer, or 1.5 oz. liquor.)  Make sure that ANY health care provider who prescribes medication for you knows that you are taking Coumadin (warfarin).  Also make sure the healthcare provider who is monitoring your Coumadin knows when you have started a new medication including herbals and non-prescription products.  Coumadin (Warfarin)  Major Drug Interactions  Increased Warfarin Effect Decreased Warfarin Effect  Alcohol (large quantities) Antibiotics (esp. Septra/Bactrim, Flagyl, Cipro) Amiodarone (Cordarone) Aspirin (ASA) Cimetidine (Tagamet) Megestrol (Megace) NSAIDs (ibuprofen, naproxen, etc.) Piroxicam (Feldene) Propafenone (Rythmol SR) Propranolol (Inderal) Isoniazid (INH) Posaconazole (Noxafil) Barbiturates (Phenobarbital) Carbamazepine (Tegretol) Chlordiazepoxide (Librium) Cholestyramine (Questran) Griseofulvin Oral Contraceptives Rifampin Sucralfate (Carafate) Vitamin K   Coumadin (Warfarin) Major Herbal Interactions  Increased Warfarin Effect Decreased Warfarin Effect  Garlic Ginseng Ginkgo biloba Coenzyme Q10 Green tea St. Johns wort    Coumadin (Warfarin) FOOD Interactions  Eat a consistent number of servings per week of foods HIGH in Vitamin K (1 serving =  cup)  Collards (cooked, or boiled & drained) Kale (cooked, or boiled & drained) Mustard greens (cooked, or boiled & drained) Parsley *serving size only =  cup Spinach (cooked, or boiled & drained) Swiss chard (cooked, or boiled  & drained) Turnip greens (cooked, or boiled & drained)  Eat a consistent number of servings per week of foods MEDIUM-HIGH in Vitamin K (1 serving = 1 cup)  Asparagus (cooked, or boiled & drained) Broccoli (cooked, boiled & drained, or raw & chopped) Brussel sprouts (cooked, or boiled & drained) *serving size only =  cup Lettuce, raw (green leaf, endive, romaine) Spinach, raw Turnip  greens, raw & chopped   These websites have more information on Coumadin (warfarin):  FailFactory.se; VeganReport.com.au;

## 2016-10-07 NOTE — Progress Notes (Addendum)
      [address]       [place] [address]             [phone]      9 Days Post-Op Procedure(s) (LRB): CORONARY ARTERY BYPASS GRAFTING (CABG) x four, using left internal mammary artery and right leg greater saphenous vein harvested endoscopically (N/A) TRANSESOPHAGEAL ECHOCARDIOGRAM (TEE) (N/A)   Subjective:  Patient without complaints.  He denies chest pain, shortness of breath.  + ambulation with assistance.  + BM  Objective: Vital signs in last 24 hours: Temp:  [97.8 F (36.6 C)-98.4 F (36.9 C)] 98.4 F (36.9 C) (06/20 0404) Pulse Rate:  [69-102] 96 (06/20 0404) Cardiac Rhythm: Atrial fibrillation (06/20 0702) Resp:  [18-22] 20 (06/20 0404) BP: (114-138)/(65-89) 114/75 (06/20 0404) SpO2:  [96 %-99 %] 96 % (06/20 0404) Weight:  [201 lb 4.5 oz (91.3 kg)-203 lb 1.6 oz (92.1 kg)] 203 lb 1.6 oz (92.1 kg) (06/20 0404)  Intake/Output from previous day: 06/19 0701 - 06/20 0700 In: 638.5 [P.O.:480; I.V.:58.5; IV Piggyback:100] Out: 1650 [Urine:1650]  General appearance: alert, cooperative and no distress Heart: regular rate and rhythm Lungs: clear to auscultation bilaterally Abdomen: soft, non-tender; bowel sounds normal; no masses,  no organomegaly Extremities: edema 1-2+ pitting Wound: clean and dry  Lab Results:  Recent Labs  10/06/16 0214 10/07/16 0347  WBC 12.5* 11.5*  HGB 10.1* 10.4*  HCT 31.1* 31.9*  PLT 238 239   BMET:  Recent Labs  10/05/16 1856 10/07/16 0347  NA 133* 134*  K 3.7 3.7  CL 98* 98*  CO2 27 29  GLUCOSE 139* 96  BUN 38* 28*  CREATININE 1.65* 1.36*  CALCIUM 8.3* 8.3*    PT/INR:  Recent Labs  10/07/16 0347  LABPROT 15.9*  INR 1.27   ABG    Component Value Date/Time   PHART 7.492 (H) 10/05/2016 0445   HCO3 27.0 10/05/2016 0445   TCO2 29 10/04/2016 1618   ACIDBASEDEF 4.0 (H) 09/28/2016 2025   O2SAT 95.5 10/05/2016 0445   CBG (last 3)   Recent Labs  10/06/16 1649 10/06/16 2110 10/07/16 0643    GLUCAP 115* 90 85    Assessment/Plan: S/P Procedure(s) (LRB): CORONARY ARTERY BYPASS GRAFTING (CABG) x four, using left internal mammary artery and right leg greater saphenous vein harvested endoscopically (N/A) TRANSESOPHAGEAL ECHOCARDIOGRAM (TEE) (N/A)  1. CV- Sinus Tach this morning- on Amiodarone and Lopressor- would benefit from increase Lopressor will increase if BP will allow, no response to coumadin yet will repeat dose of 5 mg daily 2. Pulm- wean oxygen as tolerated 3. Renal- creatinine trending down, creatinine at 1.36, will continue Lasix, supplement K 4. CBGs controlled, patient not a diabetic will d/c SSIP 5. ID- continue Zosyn for Aspiration pneumonia, tomorrow is 7 days, can likely transition to oral ABX 6. Deconditioning- will place H/H orders 7. Dispo- patient stable, increase BB for better HR control, d/c EPW, continue coumadin, supplement K continue current care   LOS: 13 days    BARRETT, Ernest Booker 10/07/2016 Patient seen and examined, agree with above Tired after walk but went 550'! Looks much better  [employer]. Roxan Hockey, MD [facility] [phone]

## 2016-10-07 NOTE — Progress Notes (Signed)
CARDIAC REHAB PHASE I   PRE:  Rate/Rhythm: 110 atrial tach  BP:  Sitting: 114/75        SaO2: 94 3L  MODE:  Ambulation: 550 ft   POST:  Rate/Rhythm: 109 atrial tach  BP:  Sitting: 118/78         SaO2: 98 2L, 93 on RA  Pt very sleepy, sitting up in recliner, agreeable to walk. Pt ambulated 550 ft on 2L O2, rolling walker, IV, pacer, assist x1, slow, steady gait, tolerated well. Pt c/o mild DOE, general fatigue, standing rest x1. Pt sats 98% on 2L O2 during ambulation, discussed with RN, placed pt on RA at rest after walk. RN to monitor. Encouraged IS, additional ambulation today. Pt to recliner after walk, call bell within reach. Will follow.   [phone] Lenna Sciara, RN, BSN 10/07/2016 9:41 AM

## 2016-10-07 NOTE — Progress Notes (Signed)
Progress Note  Patient Name: Ernest Booker Date of Encounter: 10/07/2016  Primary Cardiologist: Dr. Agustin Cree  Subjective   Feels tired.  Inpatient Medications    Scheduled Meds: . amiodarone  400 mg Oral Daily  . aspirin EC  325 mg Oral Daily   Or  . aspirin  324 mg Per Tube Daily  . atorvastatin  80 mg Oral q1800  . Chlorhexidine Gluconate Cloth  6 each Topical Daily  . coumadin book   Does not apply Once  . enoxaparin (LOVENOX) injection  40 mg Subcutaneous Q24H  . furosemide  40 mg Oral Daily  . guaiFENesin  1,200 mg Oral BID  . levalbuterol  1.25 mg Nebulization TID  . mouth rinse  15 mL Mouth Rinse BID  . metoCLOPramide (REGLAN) injection  10 mg Intravenous Q6H  . metoprolol tartrate  25 mg Oral BID  . pantoprazole  40 mg Oral BID  . potassium chloride  40 mEq Oral Daily  . sodium chloride flush  3 mL Intravenous Q12H  . warfarin  5 mg Oral q1800  . warfarin   Does not apply Once  . Warfarin - Physician Dosing Inpatient   Does not apply q1800   Continuous Infusions: . sodium chloride Stopped (09/29/16 1100)  . sodium chloride    . sodium chloride Stopped (09/29/16 1100)  . sodium chloride    . piperacillin-tazobactam (ZOSYN)  IV Stopped (10/07/16 1057)   PRN Meds: sodium chloride, Place/Maintain arterial line **AND** sodium chloride, levalbuterol, metoprolol tartrate, ondansetron (ZOFRAN) IV, oxyCODONE, promethazine, sodium chloride flush, sodium chloride flush, traMADol, zolpidem   Vital Signs    Vitals:   10/06/16 2107 10/06/16 2221 10/07/16 0404 10/07/16 1048  BP: 118/65 125/70 114/75 118/78  Pulse: 92 69 96 (!) 107  Resp: 18  20   Temp: 98.1 F (36.7 C)  98.4 F (36.9 C)   TempSrc: Oral  Oral   SpO2: 98%  96%   Weight:   203 lb 1.6 oz (92.1 kg)   Height:        Intake/Output Summary (Last 24 hours) at 10/07/16 1221 Last data filed at 10/07/16 0800  Gross per 24 hour  Intake              480 ml  Output             1450 ml  Net              -970 ml   Filed Weights   10/05/16 0900 10/06/16 1600 10/07/16 0404  Weight: 208 lb 6.4 oz (94.5 kg) 201 lb 4.5 oz (91.3 kg) 203 lb 1.6 oz (92.1 kg)    Telemetry    Atrial tach, some NSR as well - Personally Reviewed  ECG    None new- Personally Reviewed  Physical Exam   GEN: No acute distress.   Neck: No JVD Cardiac: tachycardia, no murmurs, rubs, or gallops.  Respiratory: Clear to auscultation bilaterally. GI: Soft, nontender, non-distended  MS: No edema; No deformity. Neuro:  Nonfocal  Psych: Normal affect   Labs    Chemistry Recent Labs Lab 10/01/16 0226  10/02/16 0304  10/03/16 0340  10/05/16 0605 10/05/16 1856 10/07/16 0347  NA 132*  < > 130*  < > 133*  < > 134* 133* 134*  K 3.7  < > 4.2  < > 3.8  < > 3.5 3.7 3.7  CL 97*  < > 94*  < > 97*  < > 99* 98* 98*  CO2 27  --  25  --  26  < > 27 27 29   GLUCOSE 169*  < > 170*  < > 145*  < > 110* 139* 96  BUN 14  < > 24*  < > 32*  < > 43* 38* 28*  CREATININE 1.38*  < > 1.71*  < > 1.63*  < > 1.61* 1.65* 1.36*  CALCIUM 7.9*  --  8.2*  --  8.5*  < > 8.0* 8.3* 8.3*  PROT 5.2*  --  5.3*  --  6.2*  --   --   --   --   ALBUMIN 2.9*  --  3.1*  --  3.5  --   --   --   --   AST 29  --  22  --  28  --   --   --   --   ALT 26  --  21  --  23  --   --   --   --   ALKPHOS 32*  --  34*  --  37*  --   --   --   --   BILITOT 1.1  --  0.9  --  1.5*  --   --   --   --   GFRNONAA 51*  --  39*  --  42*  < > 42* 41* 52*  GFRAA 59*  --  46*  --  48*  < > 49* 48* >60  ANIONGAP 8  --  11  --  10  < > 8 8 7   < > = values in this interval not displayed.   Hematology Recent Labs Lab 10/05/16 0342 10/06/16 0214 10/07/16 0347  WBC 11.6* 12.5* 11.5*  RBC 3.18* 3.35* 3.49*  HGB 9.4* 10.1* 10.4*  HCT 29.0* 31.1* 31.9*  MCV 91.2 92.8 91.4  MCH 29.6 30.1 29.8  MCHC 32.4 32.5 32.6  RDW 15.7* 16.0* 15.7*  PLT 212 238 239    Cardiac EnzymesNo results for input(s): TROPONINI in the last 168 hours. No results for input(s): TROPIPOC in the  last 168 hours.   BNPNo results for input(s): BNP, PROBNP in the last 168 hours.   DDimer No results for input(s): DDIMER in the last 168 hours.   Radiology    Dg Chest 1 View  Result Date: 10/06/2016 CLINICAL DATA:  Status post thoracentesis EXAM: CHEST 1 VIEW COMPARISON:  10/06/2016 FINDINGS: Cardiomegaly is again identified. Postsurgical changes are seen. Right-sided PICC line is noted in satisfactory position. Interval right-sided thoracentesis has been performed without evidence of pneumothorax. Significant reduction in right pleural fluid is noted. Mild bibasilar atelectatic changes remain. IMPRESSION: No pneumothorax following right thoracentesis. Electronically Signed   By: Inez Catalina M.D.   On: 10/06/2016 10:38   Dg Chest Port 1 View  Result Date: 10/07/2016 CLINICAL DATA:  Shortness of breath . EXAM: PORTABLE CHEST 1 VIEW COMPARISON:  10/06/2016. FINDINGS: Right PICC line stable position. Prior CABG. Cardiomegaly with normal pulmonary vascularity. Persistent basilar atelectasis. Infiltrate left lung base cannot be excluded. Small left pleural effusion. No pneumothorax. IMPRESSION: 1.  Right PICC line in stable position. 2. Prior CABG. Stable cardiomegaly. No pulmonary venous congestion. 3. Persistent basilar atelectasis. Left lower lobe infiltrate cannot be excluded. Small left pleural effusion. No pneumothorax . Chest is stable from prior exam . Electronically Signed   By: Marcello Moores  Register   On: 10/07/2016 07:38   Dg Chest Port 1 View  Result Date: 10/06/2016  CLINICAL DATA:  Shortness of breath. EXAM: PORTABLE CHEST 1 VIEW COMPARISON:  10/05/2016. FINDINGS: Right PICC line stable position. Prior CABG. Stable cardiomegaly. Persistent right base infiltrate and small right pleural effusion. Continued improvement of aeration of left lung base. No pneumothorax . IMPRESSION: Right PICC line stable position. 2. Persistent right base infiltrate and right pleural effusion. Improved aeration  left lung base. 3.  Prior CABG.  Stable cardiomegaly. Electronically Signed   By: Marcello Moores  Register   On: 10/06/2016 07:20   Ir Thoracentesis Asp Pleural Space W/img Guide  Result Date: 10/06/2016 INDICATION: Patient with history of CHF post bypass, now with acute hypoxic respiratory failure and right pleural effusion. Request is made for therapeutic thoracentesis. EXAM: ULTRASOUND GUIDED THERAPEUTIC RIGHT THORACENTESIS MEDICATIONS: 10 mL 1% lidocaine COMPLICATIONS: None immediate. PROCEDURE: An ultrasound guided thoracentesis was thoroughly discussed with the patient and questions answered. The benefits, risks, alternatives and complications were also discussed. The patient understands and wishes to proceed with the procedure. Written consent was obtained. Ultrasound was performed to localize and mark an adequate pocket of fluid in the right chest. The area was then prepped and draped in the normal sterile fashion. 1% Lidocaine was used for local anesthesia. Under ultrasound guidance a Safe-T-centesis catheter was introduced. Thoracentesis was performed. The catheter was removed and a dressing applied. FINDINGS: A total of approximately 600 mL of amber fluid was removed. IMPRESSION: Successful ultrasound guided therapeutic right thoracentesis yielding 600 mL of pleural fluid. Read by:  Brynda Greathouse PA-C Electronically Signed   By: Marybelle Killings M.D.   On: 10/06/2016 10:32    Cardiac Studies     Patient Profile     71 y.o. male with atrial tach post CABG  Assessment & Plan    1) On 6/19 at about 9:30 PM, he reverted back to NSR from atrial tach, after a sinus pause.  He then went back to atrial tach.  Continue amiodarone.  Will increase metoprolol to 50 mg BID.   Signed, Larae Grooms, MD  10/07/2016, 12:21 PM

## 2016-10-07 NOTE — Progress Notes (Addendum)
Physical Therapy Treatment Patient Details Name: Ernest Booker MRN: [id number] DOB: 06-Jul-1945 Today's Date: 10/07/2016    History of Present Illness Pt adm with NSTEMI and underwent CABG x 4 on 6/11. Pt developed respiratory failure on 6/14 and required bipap. PMH - HTN    PT Comments    Pt admitted with above diagnosis. Pt currently with functional limitations due to balance and endurance deficits.  Pt was able to ambulate with RW and did well on RA with ambulation but desat once in chair after walk.  HAd to replace O2 at 1.5LO2 to keep sats 92% and >.  Nurse made aware. Discussed sternal precautions with pt and wife. Will continue to follow acutely.  Pt will benefit from skilled PT to increase their independence and safety with mobility to allow discharge to the venue listed below.     Follow Up Recommendations  Home health PT;Supervision/Assistance - 24 hour     Equipment Recommendations  Rolling walker with 5" wheels    Recommendations for Other Services       Precautions / Restrictions Precautions Precautions: Sternal;Fall Precaution Comments: watch SpO2 Restrictions Weight Bearing Restrictions: Yes    Mobility  Bed Mobility Overal bed mobility: Needs Assistance Bed Mobility: Supine to Sit     Supine to sit: Min assist     General bed mobility comments: Needed cues for log roll and technique.    Transfers Overall transfer level: Needs assistance Equipment used: Rolling walker (2 wheeled) Transfers: Sit to/from [facility] Sit to Stand: Min guard         General transfer comment: Verbal cues for technique and to follow sternal precautions.  Pt on 1L of O2.   Ambulation/Gait Ambulation/Gait assistance: Min guard Ambulation Distance (Feet): 600 Feet Assistive device: Rolling walker (2 wheeled) Gait Pattern/deviations: Step-through pattern;Decreased stride length;Drifts right/left;Trunk flexed   Gait velocity interpretation: Below normal speed  for age/gender General Gait Details: Pt generally steady with RW.  Ambulated well needing standing rest breaks and breathing techniques.     Stairs            Wheelchair Mobility    Modified Rankin (Stroke Patients Only)       Balance Overall balance assessment: Needs assistance Sitting-balance support: No upper extremity supported;Feet supported Sitting balance-Leahy Scale: Good     Standing balance support: Bilateral upper extremity supported Standing balance-Leahy Scale: Poor Standing balance comment: walker and min guard for static standing                            Cognition Arousal/Alertness: Awake/alert Behavior During Therapy: WFL for tasks assessed/performed Overall Cognitive Status: Within Functional Limits for tasks assessed                                        Exercises      General Comments General comments (skin integrity, edema, etc.): Pt on 1L of O2. SpO2 on 1LO2 94-97% throughout ambulation. Desat noted once pt sat down in chair and had to purse lip breathe and ultimately replace 1.5LO2 because O2 hovered between 89-91%. Nurse notified that 1.5LO2 left on pt.   Discussed sternal precautions with pt and wife.  Gave handout as well.       Pertinent Vitals/Pain Pain Assessment: No/denies pain    Home Living  Prior Function            PT Goals (current goals can now be found in the care plan section) Progress towards PT goals: Progressing toward goals    Frequency    Min 3X/week      PT Plan Current plan remains appropriate    Co-evaluation              AM-PAC PT "6 Clicks" Daily Activity  Outcome Measure  Difficulty turning over in bed (including adjusting bedclothes, sheets and blankets)?: A Little Difficulty moving from lying on back to sitting on the side of the bed? : A Little Difficulty sitting down on and standing up from a chair with arms (e.g., wheelchair,  bedside commode, etc,.)?: A Little Help needed moving to and from a bed to chair (including a wheelchair)?: A Little Help needed walking in hospital room?: A Little Help needed climbing 3-5 steps with a railing? : A Lot 6 Click Score: 17    End of Session Equipment Utilized During Treatment: Oxygen;Gait belt Activity Tolerance: Patient limited by fatigue Patient left: in chair;with call bell/phone within reach;with chair alarm set Nurse Communication: Mobility status PT Visit Diagnosis: Unsteadiness on feet (R26.81);Difficulty in walking, not elsewhere classified (R26.2)     Time: 1735-6701 PT Time Calculation (min) (ACUTE ONLY): 27 min  Charges:  $Gait Training: 8-22 mins $Self Care/Home Management: 8-22                    G Codes:       Ernest Booker,PT Acute Rehabilitation [phone] (pager)    Ernest Booker 10/07/2016, 2:39 PM

## 2016-10-08 DIAGNOSIS — I480 Paroxysmal atrial fibrillation: Secondary | ICD-10-CM

## 2016-10-08 DIAGNOSIS — Z951 Presence of aortocoronary bypass graft: Secondary | ICD-10-CM

## 2016-10-08 HISTORY — DX: Presence of aortocoronary bypass graft: Z95.1

## 2016-10-08 LAB — ACETYLCHOLINE RECEPTOR AB, ALL
Acety choline binding ab: <0.03 nmol/L (ref 0.00–0.24)
Acetylchol Block Ab: 16 % (ref 0–25)
Acetylcholine Modulat Ab: <12 % (ref 0–20)

## 2016-10-08 LAB — BASIC METABOLIC PANEL
Anion gap: 7 (ref 5–15)
BUN: 22 mg/dL — ABNORMAL HIGH (ref 6–20)
CO2: 29 mmol/L (ref 22–32)
Calcium: 8.4 mg/dL — ABNORMAL LOW (ref 8.9–10.3)
Chloride: 99 mmol/L — ABNORMAL LOW (ref 101–111)
Creatinine, Ser: 1.36 mg/dL — ABNORMAL HIGH (ref 0.61–1.24)
GFR calc Af Amer: >60 mL/min (ref >60)
GFR calc non Af Amer: 52 mL/min — ABNORMAL LOW (ref >60)
Glucose, Bld: 130 mg/dL — ABNORMAL HIGH (ref 65–99)
Potassium: 3.6 mmol/L (ref 3.5–5.1)
Sodium: 135 mmol/L (ref 135–145)

## 2016-10-08 LAB — PROTIME-INR
INR: 1.69
Prothrombin Time: 20.1 seconds — ABNORMAL HIGH (ref 11.4–15.2)

## 2016-10-08 MED ORDER — ASPIRIN EC 81 MG PO TBEC
81.0000 mg | DELAYED_RELEASE_TABLET | Freq: Every day | ORAL | Status: DC
  Administered 2016-10-09: 81 mg via ORAL
  Filled 2016-10-08 (×2): qty 1

## 2016-10-08 MED ORDER — ALTEPLASE 2 MG IJ SOLR
2.0000 mg | Freq: Once | INTRAMUSCULAR | Status: AC
  Administered 2016-10-08: 2 mg
  Filled 2016-10-08: qty 2

## 2016-10-08 NOTE — Progress Notes (Addendum)
      [address]       ,[place] [address]             [phone]      10 Days Post-Op Procedure(s) (LRB): CORONARY ARTERY BYPASS GRAFTING (CABG) x four, using left internal mammary artery and right leg greater saphenous vein harvested endoscopically (N/A) TRANSESOPHAGEAL ECHOCARDIOGRAM (TEE) (N/A)   Subjective:  Ernest Booker states he is uncomfortable this morning.  He states his arthritis is bothering him.  + ambulation  + bm  Objective: Vital signs in last 24 hours: Temp:  [97.9 F (36.6 C)-98.9 F (37.2 C)] 98.6 F (37 C) (06/21 0406) Pulse Rate:  [62-107] 64 (06/21 0713) Cardiac Rhythm: Sinus bradycardia (06/21 0700) Resp:  [17-18] 18 (06/21 0713) BP: (98-118)/(55-78) 106/55 (06/21 0406) SpO2:  [93 %-96 %] 94 % (06/21 0713) Weight:  [202 lb (91.6 kg)] 202 lb (91.6 kg) (06/21 0406)  Intake/Output from previous day: 06/20 0701 - 06/21 0700 In: 600 [P.O.:600] Out: 1550 [Urine:1550]  General appearance: alert, cooperative and no distress Heart: regular rate and rhythm Lungs: clear to auscultation bilaterally Abdomen: soft, non-tender; bowel sounds normal; no masses,  no organomegaly Extremities: edema mild pitting Wound: clean and dry  Lab Results:  Recent Labs  10/06/16 0214 10/07/16 0347  WBC 12.5* 11.5*  HGB 10.1* 10.4*  HCT 31.1* 31.9*  PLT 238 239   BMET:  Recent Labs  10/05/16 1856 10/07/16 0347  NA 133* 134*  K 3.7 3.7  CL 98* 98*  CO2 27 29  GLUCOSE 139* 96  BUN 38* 28*  CREATININE 1.65* 1.36*  CALCIUM 8.3* 8.3*    PT/INR:  Recent Labs  10/07/16 0347  LABPROT 15.9*  INR 1.27   ABG    Component Value Date/Time   PHART 7.492 (H) 10/05/2016 0445   HCO3 27.0 10/05/2016 0445   TCO2 29 10/04/2016 1618   ACIDBASEDEF 4.0 (H) 09/28/2016 2025   O2SAT 95.5 10/05/2016 0445   CBG (last 3)   Recent Labs  10/06/16 1649 10/06/16 2110 10/07/16 0643  GLUCAP 115* 90 85    Assessment/Plan: S/P Procedure(s)  (LRB): CORONARY ARTERY BYPASS GRAFTING (CABG) x four, using left internal mammary artery and right leg greater saphenous vein harvested endoscopically (N/A) TRANSESOPHAGEAL ECHOCARDIOGRAM (TEE) (N/A)  1. CV- NSR, rate in the 70s- continue Lopressor at 50 mg BID, remove EPW today 2. INR 1.27, repeat coumadin at 5 mg tonight 3. Pulm- no acute issues, continue to wean oxygen  4. Renal- creatinine was trending down, weight is trending down... Continue Lasix 5. ID- aspiration pneumonia, zosyn will complete today, will discuss need for oral antibiotics with Dr. Prescott Gum tomorrow 6. Dispo- patient stable, continue Lopressor, coumadin, diuresis, continue ABX   LOS: 14 days    Ernest Booker, Ernest Booker 10/08/2016  Stop zosyn today PA/Lat CXR in am  Home when O2 weaned off Cont lasix 30 days HHN for restorative care and INR draw mon to coumadin clinic patient examined and medical record reviewed,agree with above note. Tharon Aquas Trigt III 10/08/2016

## 2016-10-08 NOTE — Progress Notes (Signed)
CARDIAC REHAB PHASE I   PRE:  Rate/Rhythm: 68 SR  BP:  Sitting: 115/70        SaO2: 96 1L  MODE:  Ambulation: 1080 ft   POST:  Rate/Rhythm: 82 SR  BP:  Sitting: 134/94         SaO2: 94 RA  Pt eager to get out of bed and walk, states his back is bothering him. Pt ambulated 340 ft on 1L O2, then proceed to walk remainder of distance on RA for a total of 1080 ft, rolling walker, IV, assist x1, slow, steady gait, tolerated well with no complaints. Pt to recliner after walk, then to bathroom per pt request, call bell within reach, RN notified. Will follow.   [phone] Lenna Sciara, RN, BSN 10/08/2016 10:19 AM

## 2016-10-08 NOTE — Progress Notes (Signed)
Progress Note  Patient Name: Ernest Booker Date of Encounter: 10/08/2016  Primary Cardiologist: Dr. Agustin Cree  Subjective   Feels better.   Inpatient Medications    Scheduled Meds: . amiodarone  400 mg Oral Daily  . aspirin EC  325 mg Oral Daily   Or  . aspirin  324 mg Per Tube Daily  . atorvastatin  80 mg Oral q1800  . Chlorhexidine Gluconate Cloth  6 each Topical Daily  . enoxaparin (LOVENOX) injection  40 mg Subcutaneous Q24H  . furosemide  40 mg Oral Daily  . guaiFENesin  1,200 mg Oral BID  . levalbuterol  1.25 mg Nebulization TID  . mouth rinse  15 mL Mouth Rinse BID  . metoCLOPramide (REGLAN) injection  10 mg Intravenous Q6H  . metoprolol tartrate  50 mg Oral BID  . pantoprazole  40 mg Oral BID  . potassium chloride  40 mEq Oral Daily  . sodium chloride flush  3 mL Intravenous Q12H  . warfarin  5 mg Oral q1800  . Warfarin - Physician Dosing Inpatient   Does not apply q1800   Continuous Infusions: . sodium chloride Stopped (09/29/16 1100)  . sodium chloride    . sodium chloride Stopped (09/29/16 1100)  . sodium chloride    . piperacillin-tazobactam (ZOSYN)  IV Stopped (10/08/16 0919)   PRN Meds: sodium chloride, Place/Maintain arterial line **AND** sodium chloride, levalbuterol, metoprolol tartrate, ondansetron (ZOFRAN) IV, oxyCODONE, promethazine, sodium chloride flush, sodium chloride flush, traMADol, zolpidem   Vital Signs    Vitals:   10/07/16 2053 10/08/16 0406 10/08/16 0713 10/08/16 1024  BP:  (!) 106/55  (!) 134/94  Pulse: 66 62 64 68  Resp: 18 18 18    Temp:  98.6 F (37 C)    TempSrc:  Oral    SpO2: 93% 95% 94%   Weight:  202 lb (91.6 kg)    Height:        Intake/Output Summary (Last 24 hours) at 10/08/16 1155 Last data filed at 10/08/16 0900  Gross per 24 hour  Intake              360 ml  Output             1700 ml  Net            -1340 ml   Filed Weights   10/06/16 1600 10/07/16 0404 10/08/16 0406  Weight: 201 lb 4.5 oz (91.3 kg) 203  lb 1.6 oz (92.1 kg) 202 lb (91.6 kg)    Telemetry    Afib noted, now in NSR - Personally Reviewed  ECG    None recent - Personally Reviewed  Physical Exam   GEN: No acute distress.  Frail Neck: No JVD Cardiac: RRR, no murmurs, rubs, or gallops.  Respiratory: Clear to auscultation bilaterally. GI: Soft, nontender, non-distended  MS: Tr leg edema; No deformity. Neuro:  Nonfocal  Psych: Normal affect   Labs    Chemistry Recent Labs Lab 10/02/16 0304  10/03/16 0340  10/05/16 1856 10/07/16 0347 10/08/16 0903  NA 130*  < > 133*  < > 133* 134* 135  K 4.2  < > 3.8  < > 3.7 3.7 3.6  CL 94*  < > 97*  < > 98* 98* 99*  CO2 25  --  26  < > 27 29 29   GLUCOSE 170*  < > 145*  < > 139* 96 130*  BUN 24*  < > 32*  < > 38* 28* 22*  CREATININE 1.71*  < > 1.63*  < > 1.65* 1.36* 1.36*  CALCIUM 8.2*  --  8.5*  < > 8.3* 8.3* 8.4*  PROT 5.3*  --  6.2*  --   --   --   --   ALBUMIN 3.1*  --  3.5  --   --   --   --   AST 22  --  28  --   --   --   --   ALT 21  --  23  --   --   --   --   ALKPHOS 34*  --  37*  --   --   --   --   BILITOT 0.9  --  1.5*  --   --   --   --   GFRNONAA 39*  --  42*  < > 41* 52* 52*  GFRAA 46*  --  48*  < > 48* >60 >60  ANIONGAP 11  --  10  < > 8 7 7   < > = values in this interval not displayed.   Hematology Recent Labs Lab 10/05/16 0342 10/06/16 0214 10/07/16 0347  WBC 11.6* 12.5* 11.5*  RBC 3.18* 3.35* 3.49*  HGB 9.4* 10.1* 10.4*  HCT 29.0* 31.1* 31.9*  MCV 91.2 92.8 91.4  MCH 29.6 30.1 29.8  MCHC 32.4 32.5 32.6  RDW 15.7* 16.0* 15.7*  PLT 212 238 239    Cardiac EnzymesNo results for input(s): TROPONINI in the last 168 hours. No results for input(s): TROPIPOC in the last 168 hours.   BNPNo results for input(s): BNP, PROBNP in the last 168 hours.   DDimer No results for input(s): DDIMER in the last 168 hours.   Radiology    Dg Chest Port 1 View  Result Date: 10/07/2016 CLINICAL DATA:  Shortness of breath . EXAM: PORTABLE CHEST 1 VIEW  COMPARISON:  10/06/2016. FINDINGS: Right PICC line stable position. Prior CABG. Cardiomegaly with normal pulmonary vascularity. Persistent basilar atelectasis. Infiltrate left lung base cannot be excluded. Small left pleural effusion. No pneumothorax. IMPRESSION: 1.  Right PICC line in stable position. 2. Prior CABG. Stable cardiomegaly. No pulmonary venous congestion. 3. Persistent basilar atelectasis. Left lower lobe infiltrate cannot be excluded. Small left pleural effusion. No pneumothorax . Chest is stable from prior exam . Electronically Signed   By: Marcello Moores  Register   On: 10/07/2016 07:38    Cardiac Studies     Patient Profile     71 y.o. male s/p CABG  Assessment & Plan    AFib:  Clearly, he has had AFib on the monitor but has converted to NSR.  COntinue Amio. COumadin started.   We can arrange INR f/u when he is ready for discharge.  Signed, Larae Grooms, MD  10/08/2016, 11:55 AM

## 2016-10-08 NOTE — Care Management Note (Signed)
Case Management Note Marvetta Gibbons RN, BSN Unit 2W-Case Manager-- [place] coverage [phone]  Patient Details  Name: Ernest Booker MRN: [id number] Date of Birth: 09/30/1945  Subjective/Objective:   Pt admitted to [facility] as a  transfer from outside hospital with non-STEMI. Cath reveled MVD- pt s/p CABGx4 on 09/28/16                Action/Plan: PTA pt lived at home with wife- active and independent- anticipate pt will return home with wife- CM will follow pt progression post op.   Expected Discharge Date:                  Expected Discharge Plan:  [place]  In-House Referral:     Discharge planning Services  CM Consult  Post Acute Care Choice:  Home Health, Durable Medical Equipment Choice offered to:  Patient, Adult Children  DME Arranged:  Walker rolling DME Agency:  [place] Arranged:  RN, PT [facility] Agency:  [place] [facility] [place] [facility]  Status of Service:  Completed, signed off  If discussed at [place] of Stay Meetings, dates discussed:    Discharge Disposition: home with home health   Additional Comments:  10/08/16- 1130-- Marvetta Gibbons RN, CM- received referral for [facility] needs- orders placed for HHRN/PT- spoke with pt and family at bedside- choice offered for [facility] agencies in [facility]- per pt and family- they would like to use [place] [facility]- call made to agency- spoke with Amy- referral accepted with services to start this weekend if pt discharges tomorrow- orders and paperwork along with notes faxed to [facility] at [phone]- they will need a d/c summary faxed on discharge when available. -- pt will also need RW for home - have notified Santiago Glad with [facility] for DME need- RW to be delivered to room prior to discharge.   Dawayne Patricia, RN 10/08/2016, 12:11 PM
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
## 2016-10-08 NOTE — Progress Notes (Addendum)
[facility] Attending Note  ADMISSION DATE:09/24/2016  CONSULTATION DATE:10/01/2016  REFERRING MD:Van Trigt, M.D. / CVTS  CHIEF COMPLAINT:Acute Hypoxic Respiratory Failure  Presenting HPI:  71 y.o. male with hypertension and hyperlipidemia transferred to our hospital for chest pain with multivessel coronary artery disease sitting on left heart catheterization. Patient underwent four-vessel bypass on 6/11 and was extubated on 6/12. Patient had increased audible wheezing with ambulation on 6/13. Developed intermittent nausea and vomiting the morning of 6/14. Diuresis was initiated by primary service. Later on 6/14 patient was notably desaturated to 89% on 6 L/m. Nebulizer treatments were started but given patient's tenuous respiratory status pulmonary medicine was called for consultation. Patient was started on noninvasive positive pressure ventilation.  Subjective:  No acute events. Breathing continues to improve. On 1.5 lt nasal cannula.   Review of Systems:  Denies any abdominal pain or nausea. No subjective fever or chills.  Temp:  [97.9 F (36.6 C)-98.9 F (37.2 C)] 98.6 F (37 C) (06/21 0406) Pulse Rate:  [62-107] 64 (06/21 0713) Resp:  [17-18] 18 (06/21 0713) BP: (98-118)/(55-78) 106/55 (06/21 0406) SpO2:  [93 %-96 %] 94 % (06/21 0713) Weight:  [202 lb (91.6 kg)] 202 lb (91.6 kg) (06/21 0406)  Gen:      No acute distress HEENT:  EOMI, sclera anicteric Neck:     No masses; no thyromegaly Lungs:    Clear to auscultation bilaterally; normal respiratory effort CV:         Regular rate and rhythm; no murmurs Abd:      + bowel sounds; soft, non-tender; no palpable masses, no distension Ext:    No edema; adequate peripheral perfusion Skin:      Warm and dry; no rash Neuro: alert and oriented x 3 Psych: normal mood and affect  CBC Latest Ref Rng & Units 10/07/2016 10/06/2016 10/05/2016  WBC 4.0 - 10.5 K/uL 11.5(H) 12.5(H) 11.6(H)  Hemoglobin 13.0 - 17.0  g/dL 10.4(L) 10.1(L) 9.4(L)  Hematocrit 39.0 - 52.0 % 31.9(L) 31.1(L) 29.0(L)  Platelets 150 - 400 K/uL 239 238 212   BMP Latest Ref Rng & Units 10/07/2016 10/05/2016 10/05/2016  Glucose 65 - 99 mg/dL 96 139(H) 110(H)  BUN 6 - 20 mg/dL 28(H) 38(H) 43(H)  Creatinine 0.61 - 1.24 mg/dL 1.36(H) 1.65(H) 1.61(H)  Sodium 135 - 145 mmol/L 134(L) 133(L) 134(L)  Potassium 3.5 - 5.1 mmol/L 3.7 3.7 3.5  Chloride 101 - 111 mmol/L 98(L) 98(L) 99(L)  CO2 22 - 32 mmol/L 29 27 27   Calcium 8.9 - 10.3 mg/dL 8.3(L) 8.3(L) 8.0(L)    IMAGING/STUDIES: PFT 09/25/16: FVC 3.19 L (83%) FEV1 2.16 L (76%) FEV1/FVC 0.68 FEF 25-75 4.13 L (51%) negative bronchodilator response TTE 6/14:  LV normal in size with EF 65-70%. LA mildly dilated & RA normal in size. RV normal in size and function. No aortic stenosis or regurgitation. Aortic root normal in size. No significant mitral regurgitation. Mild pulmonic and tricuspid regurgitation. Small posterior pericardial effusion. CT CHEST W/O 6/15:  Small bilateral pleural effusions, right greater than left, with partial consolidation of both lower lung lobes. Underlying pneumonia is a concern. Scattered coronary artery calcifications seen. PORT CXR 6/18:  Right-sided PICC line in good position. Persistent bilateral lower lung predominant opacities unchanged. PORT CXR 6/19: Right upper extremity PICC line in place. Persistent bilateral lower lung opacities right greater than left with fluid still present within right fissure. New blunting of left costal cardiac echo suggestive of increasing effusion size. PORT CXR 6/20: Persistent  bibasilar atelectasis. No significant effusion identified. I reviewed the images personally  MICROBIOLOGY: MRSA PCR 6/8:  Negative   ANTIBIOTICS: Zosyn 6/14 >>>  ASSESSMENT/PLAN:  71 y.o. male with acute hypoxic respiratory failure post bypass. Hypoxia is likely multifactorial from not only fluid overload but also some element of atelectasis. Low suspicion  for infectious etiology.  S/p thoracentesis on left and right with improvement in resp status. There is no evidence of COPD exacerbation.   - Continue pulmonary hygeine, incentive spirometry - OOB and ambulation. - Continue nebs as needed. - Consider stopping antibitics as he has received 7 days.  PCCM will sign off. Please call with any questions.

## 2016-10-08 NOTE — Progress Notes (Signed)
EPW d/c'd per order and per protocol. VSS, tips intact. Wife at bedside. Call bell and phone within reach. Will continue to monitor.

## 2016-10-09 LAB — PROTIME-INR
INR: 2.31
Prothrombin Time: 25.8 seconds — ABNORMAL HIGH (ref 11.4–15.2)

## 2016-10-09 LAB — BASIC METABOLIC PANEL
Anion gap: 6 (ref 5–15)
BUN: 21 mg/dL — ABNORMAL HIGH (ref 6–20)
CO2: 30 mmol/L (ref 22–32)
Calcium: 8.5 mg/dL — ABNORMAL LOW (ref 8.9–10.3)
Chloride: 100 mmol/L — ABNORMAL LOW (ref 101–111)
Creatinine, Ser: 1.32 mg/dL — ABNORMAL HIGH (ref 0.61–1.24)
GFR calc Af Amer: >60 mL/min (ref >60)
GFR calc non Af Amer: 54 mL/min — ABNORMAL LOW (ref >60)
Glucose, Bld: 102 mg/dL — ABNORMAL HIGH (ref 65–99)
Potassium: 4.1 mmol/L (ref 3.5–5.1)
Sodium: 136 mmol/L (ref 135–145)

## 2016-10-09 MED ORDER — SODIUM CHLORIDE 0.9% FLUSH
10.0000 mL | INTRAVENOUS | Status: DC | PRN
  Administered 2016-10-10: 10 mL
  Filled 2016-10-09: qty 40

## 2016-10-09 MED ORDER — WARFARIN SODIUM 1 MG PO TABS: 1.0000 mg | ORAL_TABLET | Freq: Every day | ORAL | Status: DC

## 2016-10-09 MED ORDER — METOPROLOL TARTRATE 25 MG PO TABS
25.0000 mg | ORAL_TABLET | Freq: Two times a day (BID) | ORAL | Status: DC
  Administered 2016-10-09: 25 mg via ORAL
  Filled 2016-10-09: qty 1

## 2016-10-09 MED ORDER — ONDANSETRON HCL 4 MG/2ML IJ SOLN
4.0000 mg | Freq: Once | INTRAMUSCULAR | Status: DC
  Filled 2016-10-09: qty 2

## 2016-10-09 MED ORDER — METOPROLOL TARTRATE 12.5 MG HALF TABLET
12.5000 mg | ORAL_TABLET | Freq: Two times a day (BID) | ORAL | Status: DC
  Filled 2016-10-09: qty 1

## 2016-10-09 MED ORDER — WARFARIN SODIUM 2.5 MG PO TABS
2.5000 mg | ORAL_TABLET | Freq: Every day | ORAL | Status: DC
  Administered 2016-10-09: 2.5 mg via ORAL
  Filled 2016-10-09: qty 1

## 2016-10-09 MED ORDER — AMIODARONE HCL 200 MG PO TABS
200.0000 mg | ORAL_TABLET | Freq: Every day | ORAL | Status: DC
  Administered 2016-10-09: 200 mg via ORAL
  Filled 2016-10-09 (×2): qty 1

## 2016-10-09 NOTE — Progress Notes (Signed)
Physical Therapy Treatment Patient Details Name: Ernest Booker MRN: [id number] DOB: Nov 25, 1945 Today's Date: 10/09/2016    History of Present Illness Pt adm with NSTEMI and underwent CABG x 4 on 6/11. Pt developed respiratory failure on 6/14 and required bipap. PMH - HTN    PT Comments    Patient making progress with mobility and gait.  Back pain present today during session.  Agree with need for HHPT at d/c.   Follow Up Recommendations  Home health PT;Supervision/Assistance - 24 hour     Equipment Recommendations  Rolling walker with 5" wheels    Recommendations for Other Services       Precautions / Restrictions Precautions Precautions: Sternal;Fall Precaution Comments: watch SpO2 Restrictions Weight Bearing Restrictions: Yes Other Position/Activity Restrictions: Sternal precautions    Mobility  Bed Mobility Overal bed mobility: Needs Assistance Bed Mobility: Supine to Sit;Sit to Sidelying     Supine to sit: Mod assist   Sit to sidelying: Min assist General bed mobility comments: Cues to use pillow and move to sidelying.  Wife quickly moved to assist patient from supine to sit with mod assist at back.  Encouraged patient to do as much on his own as possible.  Required min assist to bring LE's onto bed to return to sidelying.  Transfers Overall transfer level: Needs assistance Equipment used: Rolling walker (2 wheeled) Transfers: Sit to/from Stand Sit to Stand: Supervision         General transfer comment: Verbal cues to maintain sternal precautions.  Patient able to move to EOB and stand using LE's only.  Good balance in stance.  Ambulation/Gait Ambulation/Gait assistance: Min guard Ambulation Distance (Feet): 600 Feet Assistive device: Rolling walker (2 wheeled) Gait Pattern/deviations: Step-through pattern;Decreased stride length;Trunk flexed   Gait velocity interpretation: at or above normal speed for age/gender General Gait Details: Patient with quick  gait speed.  Cues to move at more slow pace to minimize DOE.  Patient able to ambulate 100' with no assistive device, but increased back pain.  Returned to [employer] for support.  Patient with DOE of 3/4 at end of gait.  Reviewed breathing techniques.   Stairs            Wheelchair Mobility    Modified Rankin (Stroke Patients Only)       Balance           Standing balance support: No upper extremity supported Standing balance-Leahy Scale: Good Standing balance comment: Able to ambulate with no assistive device with no loss of balance.  However requires RW to minimize back pain and for some balance support.                            Cognition Arousal/Alertness: Awake/alert Behavior During Therapy: WFL for tasks assessed/performed;Flat affect Overall Cognitive Status: Within Functional Limits for tasks assessed                                        Exercises      General Comments        Pertinent Vitals/Pain Pain Assessment: Faces Faces Pain Scale: Hurts whole lot Pain Location: back Pain Descriptors / Indicators: Aching;Grimacing;Sharp;Sore Pain Intervention(s): Monitored during session;Repositioned;Patient requesting pain meds-RN notified    Home Living  Prior Function            PT Goals (current goals can now be found in the care plan section) Acute Rehab PT Goals Patient Stated Goal: go home Progress towards PT goals: Progressing toward goals    Frequency    Min 3X/week      PT Plan Current plan remains appropriate    Co-evaluation              AM-PAC PT "6 Clicks" Daily Activity  Outcome Measure  Difficulty turning over in bed (including adjusting bedclothes, sheets and blankets)?: None Difficulty moving from lying on back to sitting on the side of the bed? : A Little Difficulty sitting down on and standing up from a chair with arms (e.g., wheelchair, bedside commode, etc,.)?:  None Help needed moving to and from a bed to chair (including a wheelchair)?: A Little Help needed walking in hospital room?: A Little Help needed climbing 3-5 steps with a railing? : A Lot 6 Click Score: 19    End of Session Equipment Utilized During Treatment: Gait belt Activity Tolerance: Patient tolerated treatment well Patient left: in bed;with call bell/phone within reach;with family/visitor present Nurse Communication: Mobility status;Patient requests pain meds PT Visit Diagnosis: Unsteadiness on feet (R26.81);Difficulty in walking, not elsewhere classified (R26.2)     Time: 1354-1410 PT Time Calculation (min) (ACUTE ONLY): 16 min  Charges:  $Gait Training: 8-22 mins                    G Codes:       Carita Pian. Sanjuana Kava, [facility] Acute Rehab Services Pager [place] [address] 10/09/2016, 4:43 PM

## 2016-10-09 NOTE — Progress Notes (Signed)
CARDIAC REHAB PHASE I   PRE:  Rate/Rhythm: 64 ? SR with PACs  BP:  Supine:   Sitting: 116/52  Standing:    SaO2: 91%RA  MODE:  Ambulation: 890 ft   POST:  Rate/Rhythm: 78 ? SR PACs  BP:  Supine:   Sitting: 141/78  Standing:    SaO2: 92-94%RA  halll and room [phone] Pt walked 890 ft on RA with rolling walker and minimal asst. Stopped twice to rest and I checked sats at 92-94%. Pt has rolling walker in room for home. Slightly SOB and I encouraged slow deep breaths. Education completed with pt and wife who voiced understanding. Reviewed how to view discharge and Coumadin videos and wrote down. Gave heart healthy diet and encouraged watching sodium, ex ed, importance of IS and sternal precautions. Wife stated that they had received Coumadin ed. Discussed CRP 2 and referring to [facility] program.   Graylon Good, RN BSN  10/09/2016 9:51 AM

## 2016-10-09 NOTE — Progress Notes (Addendum)
[address]       [place] [address]             [phone]      11 Days Post-Op Procedure(s) (LRB): CORONARY ARTERY BYPASS GRAFTING (CABG) x four, using left internal mammary artery and right leg greater saphenous vein harvested endoscopically (N/A) TRANSESOPHAGEAL ECHOCARDIOGRAM (TEE) (N/A) Subjective: Having frequent SVPB's and some bradycardia  Objective: Vital signs in last 24 hours: Temp:  [97.9 F (36.6 C)-99 F (37.2 C)] 99 F (37.2 C) (06/22 0300) Pulse Rate:  [56-68] 63 (06/22 0300) Cardiac Rhythm: Sinus bradycardia (06/22 0700) Resp:  [18] 18 (06/22 0300) BP: (102-134)/(63-94) 111/63 (06/22 0300) SpO2:  [91 %-95 %] 93 % (06/22 0300) Weight:  [200 lb 6.4 oz (90.9 kg)] 200 lb 6.4 oz (90.9 kg) (06/22 0300)  Hemodynamic parameters for last 24 hours:    Intake/Output from previous day: 06/21 0701 - 06/22 0700 In: 290 [P.O.:240; IV Piggyback:50] Out: 350 [Urine:350] Intake/Output this shift: No intake/output data recorded.  General appearance: alert, cooperative and no distress Heart: irregularly irregular rhythm Lungs: min dim in the bases Abdomen: soft, nontender Extremities: minor edema Wound: incis healing well  Lab Results:  Recent Labs  10/07/16 0347  WBC 11.5*  HGB 10.4*  HCT 31.9*  PLT 239   BMET:  Recent Labs  10/08/16 0903 10/09/16 0526  NA 135 136  K 3.6 4.1  CL 99* 100*  CO2 29 30  GLUCOSE 130* 102*  BUN 22* 21*  CREATININE 1.36* 1.32*  CALCIUM 8.4* 8.5*    PT/INR:  Recent Labs  10/09/16 0526  LABPROT 25.8*  INR 2.31   ABG    Component Value Date/Time   PHART 7.492 (H) 10/05/2016 0445   HCO3 27.0 10/05/2016 0445   TCO2 29 10/04/2016 1618   ACIDBASEDEF 4.0 (H) 09/28/2016 2025   O2SAT 95.5 10/05/2016 0445   CBG (last 3)   Recent Labs  10/06/16 1649 10/06/16 2110 10/07/16 0643  GLUCAP 115* 90 85    Meds Scheduled Meds: . amiodarone  400 mg Oral Daily  . aspirin EC  81 mg Oral Daily    . atorvastatin  80 mg Oral q1800  . Chlorhexidine Gluconate Cloth  6 each Topical Daily  . enoxaparin (LOVENOX) injection  40 mg Subcutaneous Q24H  . furosemide  40 mg Oral Daily  . guaiFENesin  1,200 mg Oral BID  . levalbuterol  1.25 mg Nebulization TID  . mouth rinse  15 mL Mouth Rinse BID  . metoprolol tartrate  50 mg Oral BID  . pantoprazole  40 mg Oral BID  . potassium chloride  40 mEq Oral Daily  . sodium chloride flush  3 mL Intravenous Q12H  . warfarin  5 mg Oral q1800  . Warfarin - Physician Dosing Inpatient   Does not apply q1800   Continuous Infusions: . sodium chloride Stopped (09/29/16 1100)  . sodium chloride    . sodium chloride Stopped (09/29/16 1100)  . sodium chloride     PRN Meds:.sodium chloride, Place/Maintain arterial line **AND** sodium chloride, levalbuterol, metoprolol tartrate, ondansetron (ZOFRAN) IV, oxyCODONE, promethazine, sodium chloride flush, sodium chloride flush, sodium chloride flush, traMADol, zolpidem  Xrays No results found.  Assessment/Plan: S/P Procedure(s) (LRB): CORONARY ARTERY BYPASS GRAFTING (CABG) x four, using left internal mammary artery and right leg greater saphenous vein harvested endoscopically (N/A) TRANSESOPHAGEAL ECHOCARDIOGRAM (TEE) (N/A)  1 conts to progress with physical recovery, ambulation improving 2 now off O2 with  adeq sats 3 not in afib, but brady at times with freq SVPB's - on amio 400 BID, Metoprolol 50 BID- will reduce beta blocker dose and obtain a 12 lead to try to eval QTc 4 CBG's well controlled- needs good diet control of CHO's for insulin resistance His lipid panel isn't too bad for what we test, could consider a lower dose for statin as last T.Bili a little high 5 he is on coumadin- will decrease does  6 cont gentle diuresis- creat is stable 7 now off abx- tmax 99  LOS: 15 days    GOLD,WAYNE E 10/09/2016 Watch rhythm and titrate meds for afib Monitor in hospital another 24 hrs patient examined and  medical record reviewed,agree with above note. Tharon Aquas Trigt III 10/09/2016

## 2016-10-10 ENCOUNTER — Inpatient Hospital Stay (HOSPITAL_COMMUNITY): Payer: BLUE CROSS/BLUE SHIELD

## 2016-10-10 LAB — BASIC METABOLIC PANEL
Anion gap: 6 (ref 5–15)
BUN: 18 mg/dL (ref 6–20)
CO2: 27 mmol/L (ref 22–32)
Calcium: 8.6 mg/dL — ABNORMAL LOW (ref 8.9–10.3)
Chloride: 101 mmol/L (ref 101–111)
Creatinine, Ser: 1.26 mg/dL — ABNORMAL HIGH (ref 0.61–1.24)
GFR calc Af Amer: >60 mL/min (ref >60)
GFR calc non Af Amer: 57 mL/min — ABNORMAL LOW (ref >60)
Glucose, Bld: 84 mg/dL (ref 65–99)
Potassium: 4.1 mmol/L (ref 3.5–5.1)
Sodium: 134 mmol/L — ABNORMAL LOW (ref 135–145)

## 2016-10-10 LAB — PROTIME-INR
INR: 2.38
Prothrombin Time: 26.5 seconds — ABNORMAL HIGH (ref 11.4–15.2)

## 2016-10-10 MED ORDER — ATORVASTATIN CALCIUM 80 MG PO TABS: 80.0000 mg | ORAL_TABLET | Freq: Every day | ORAL | 1 refills | Status: DC

## 2016-10-10 MED ORDER — METOPROLOL TARTRATE 25 MG PO TABS: 12.5000 mg | ORAL_TABLET | Freq: Two times a day (BID) | ORAL | 1 refills | Status: DC

## 2016-10-10 MED ORDER — OXYCODONE HCL 5 MG PO TABS
5.0000 mg | ORAL_TABLET | Freq: Four times a day (QID) | ORAL | 0 refills | Status: DC | PRN
Start: 2016-10-10 — End: 2016-12-16

## 2016-10-10 MED ORDER — WARFARIN SODIUM 1 MG PO TABS: 1.0000 mg | ORAL_TABLET | Freq: Every day | ORAL | 1 refills | Status: DC

## 2016-10-10 MED ORDER — AMIODARONE HCL 200 MG PO TABS: 200.0000 mg | ORAL_TABLET | Freq: Every day | ORAL | 1 refills | Status: DC

## 2016-10-10 MED ORDER — ASPIRIN 81 MG PO TBEC: 81.0000 mg | DELAYED_RELEASE_TABLET | Freq: Every day | ORAL | Status: DC

## 2016-10-10 MED ORDER — OXYCODONE HCL 5 MG PO TABS
10.0000 mg | ORAL_TABLET | Freq: Once | ORAL | Status: AC
  Administered 2016-10-10: 10 mg via ORAL
  Filled 2016-10-10: qty 2

## 2016-10-10 NOTE — Progress Notes (Signed)
Discharged to home with family office visits in place teaching done  

## 2016-10-10 NOTE — Progress Notes (Signed)
CARDIAC REHAB PHASE I   PRE:  Rate/Rhythm: 83  BP:  Sitting: 124/71     SaO2: 93% ra  MODE:  Ambulation: 900 ft   POST:  Rate/Rhythm: 110  BP:  Sitting: 142/80     SaO2: 96% ra  9:15a-9:45a Patient ambulated with rolling walker with x1 assist and gait belt. Complained of 7/10 back pain and shortness of breath. Encouraged IS use. Placed back in bed with wife at bed side.    [place], [place] 10/10/2016 9:43 AM

## 2016-10-10 NOTE — Progress Notes (Signed)
Spoke with wife, DME delivered. Faxed noted to [place]. No other CM needs. DC to home.

## 2016-10-10 NOTE — Progress Notes (Addendum)
[address]       [place] [address]             [phone]      12 Days Post-Op Procedure(s) (LRB): CORONARY ARTERY BYPASS GRAFTING (CABG) x four, using left internal mammary artery and right leg greater saphenous vein harvested endoscopically (N/A) TRANSESOPHAGEAL ECHOCARDIOGRAM (TEE) (N/A) Subjective: Oversedated after phenargan, o/w feels ok. Having some afib with controlled rate  Objective: Vital signs in last 24 hours: Temp:  [98.1 F (36.7 C)-98.4 F (36.9 C)] 98.2 F (36.8 C) (06/23 0554) Pulse Rate:  [60-74] 74 (06/23 0554) Cardiac Rhythm: Normal sinus rhythm (06/22 1916) Resp:  [17-20] 20 (06/23 0554) BP: (127-134)/(60-74) 134/71 (06/23 0554) SpO2:  [92 %-95 %] 95 % (06/23 0554) Weight:  [198 lb 8 oz (90 kg)] 198 lb 8 oz (90 kg) (06/23 0554)  Hemodynamic parameters for last 24 hours:    Intake/Output from previous day: 06/22 0701 - 06/23 0700 In: 480 [P.O.:480] Out: 1000 [Urine:1000] Intake/Output this shift: No intake/output data recorded.  General appearance: alert, cooperative and no distress Heart: regular rate and rhythm Lungs: clear to auscultation bilaterally Abdomen: some distension, + BS, non-tender Extremities: no edema Wound: incis healing well  Lab Results: No results for input(s): WBC, HGB, HCT, PLT in the last 72 hours. BMET:  Recent Labs  10/09/16 0526 10/10/16 0440  NA 136 134*  K 4.1 4.1  CL 100* 101  CO2 30 27  GLUCOSE 102* 84  BUN 21* 18  CREATININE 1.32* 1.26*  CALCIUM 8.5* 8.6*    PT/INR:  Recent Labs  10/10/16 0440  LABPROT 26.5*  INR 2.38   ABG    Component Value Date/Time   PHART 7.492 (H) 10/05/2016 0445   HCO3 27.0 10/05/2016 0445   TCO2 29 10/04/2016 1618   ACIDBASEDEF 4.0 (H) 09/28/2016 2025   O2SAT 95.5 10/05/2016 0445   CBG (last 3)  No results for input(s): GLUCAP in the last 72 hours.  Meds Scheduled Meds: . amiodarone  200 mg Oral Daily  . aspirin EC  81 mg Oral Daily    . atorvastatin  80 mg Oral q1800  . Chlorhexidine Gluconate Cloth  6 each Topical Daily  . furosemide  40 mg Oral Daily  . guaiFENesin  1,200 mg Oral BID  . mouth rinse  15 mL Mouth Rinse BID  . metoprolol tartrate  12.5 mg Oral BID  . ondansetron (ZOFRAN) IV  4 mg Intravenous Once  . pantoprazole  40 mg Oral BID  . potassium chloride  40 mEq Oral Daily  . sodium chloride flush  3 mL Intravenous Q12H  . warfarin  1 mg Oral q1800  . Warfarin - Physician Dosing Inpatient   Does not apply q1800   Continuous Infusions: . sodium chloride Stopped (09/29/16 1100)  . sodium chloride    . sodium chloride Stopped (09/29/16 1100)  . sodium chloride     PRN Meds:.sodium chloride, Place/Maintain arterial line **AND** sodium chloride, ondansetron (ZOFRAN) IV, sodium chloride flush, sodium chloride flush, sodium chloride flush, traMADol, zolpidem  Xrays Dg Chest 2 View  Result Date: 10/10/2016 CLINICAL DATA:  Post CABG. EXAM: CHEST  2 VIEW COMPARISON:  10/07/2016 FINDINGS: Sternotomy wires unchanged. Right-sided PICC line unchanged. Stable left base opacification likely moderate size effusion with associated basilar atelectasis. Small amount right pleural fluid. Stable cardiomegaly. Remainder of the exam is unchanged. IMPRESSION: Stable moderate size left pleural effusion likely with associated basilar atelectasis. Small  amount right pleural fluid. Stable cardiomegaly. Right-sided PICC line unchanged. Electronically Signed   By: Marin Olp M.D.   On: 10/10/2016 08:37    Assessment/Plan: S/P Procedure(s) (LRB): CORONARY ARTERY BYPASS GRAFTING (CABG) x four, using left internal mammary artery and right leg greater saphenous vein harvested endoscopically (N/A) TRANSESOPHAGEAL ECHOCARDIOGRAM (TEE) (N/A) 1 steady progress overall 2 phenergan has been stopped- no current nausea 3 afib at times with good rate control, INR 2.3 4 labs ok, creat slightly better 5 will d/w MD- possible d/c today    LOS: 16 days    GOLD,WAYNE E 10/10/2016 cxr clear Home on coumadin and a-fib meds patient examined and medical record reviewed,agree with above note. Tharon Aquas Trigt III 10/10/2016

## 2016-10-11 DIAGNOSIS — Z48812 Encounter for surgical aftercare following surgery on the circulatory system: Secondary | ICD-10-CM | POA: Diagnosis not present

## 2016-10-12 ENCOUNTER — Telehealth: Payer: Self-pay | Admitting: Pharmacist

## 2016-10-12 NOTE — Telephone Encounter (Signed)
Santiago Glad said wife wants to know if they can draw pt's PT at home,so hard to get him out.

## 2016-10-13 NOTE — Telephone Encounter (Signed)
Spoke with Santiago Glad with [facility] and gave order to draw INR with home visit tomorrow. INR will be called to our office direct number to coumadin clinic given. Will cancel in office visit.

## 2016-10-14 ENCOUNTER — Ambulatory Visit (INDEPENDENT_AMBULATORY_CARE_PROVIDER_SITE_OTHER): Payer: BLUE CROSS/BLUE SHIELD | Admitting: Cardiology

## 2016-10-14 DIAGNOSIS — I480 Paroxysmal atrial fibrillation: Secondary | ICD-10-CM

## 2016-10-14 DIAGNOSIS — Z5181 Encounter for therapeutic drug level monitoring: Secondary | ICD-10-CM

## 2016-10-14 HISTORY — DX: Encounter for therapeutic drug level monitoring: Z51.81

## 2016-10-14 LAB — POCT INR: INR: 2.1

## 2016-10-19 ENCOUNTER — Ambulatory Visit (INDEPENDENT_AMBULATORY_CARE_PROVIDER_SITE_OTHER): Payer: Self-pay | Admitting: Internal Medicine

## 2016-10-19 DIAGNOSIS — Z5181 Encounter for therapeutic drug level monitoring: Secondary | ICD-10-CM

## 2016-10-19 DIAGNOSIS — I480 Paroxysmal atrial fibrillation: Secondary | ICD-10-CM

## 2016-10-19 LAB — POCT INR: INR: 1.7

## 2016-10-22 ENCOUNTER — Ambulatory Visit (INDEPENDENT_AMBULATORY_CARE_PROVIDER_SITE_OTHER): Payer: BLUE CROSS/BLUE SHIELD | Admitting: Cardiology

## 2016-10-22 ENCOUNTER — Encounter: Payer: Self-pay | Admitting: Cardiology

## 2016-10-22 VITALS — BP 130/66 | HR 72 | Resp 10 | Ht 66.0 in | Wt 187.0 lb

## 2016-10-22 DIAGNOSIS — I10 Essential (primary) hypertension: Secondary | ICD-10-CM

## 2016-10-22 DIAGNOSIS — I2510 Atherosclerotic heart disease of native coronary artery without angina pectoris: Secondary | ICD-10-CM

## 2016-10-22 DIAGNOSIS — I480 Paroxysmal atrial fibrillation: Secondary | ICD-10-CM | POA: Diagnosis not present

## 2016-10-22 DIAGNOSIS — I255 Ischemic cardiomyopathy: Secondary | ICD-10-CM

## 2016-10-22 DIAGNOSIS — Z951 Presence of aortocoronary bypass graft: Secondary | ICD-10-CM

## 2016-10-22 HISTORY — DX: Ischemic cardiomyopathy: I25.5

## 2016-10-22 NOTE — Progress Notes (Signed)
Cardiology Office Note:    Date:  10/22/2016   ID:  Ernest Booker, DOB 1945-06-13, MRN [id number]  PCP:  Myer Peer, MD  Cardiologist:  Jenne Campus, MD    Referring MD: Myer Peer, MD   Chief Complaint  Patient presents with  . Hospitalization Follow-up  Coronary artery disease, status post coronary artery bypass graft.  History of Present Illness:    Ernest Booker is a 71 y.o. male  with recent coronary artery bypass graft. I saw him in the [facility] he can with non-STEMI and was transferred to [facility]. Cardiac catheterization was done which shows severe triple-vessel disease. He ended up having bypass surgery with 4 grafts. He had a rough course after that included some aspiration pneumonia with some pleural effusion. Also went to atrial fibrillation. Now sent home about 10 days ago and seems to be doing better. Plan I'll have difficulty sleeping as well as some pain in the chest however use pain medication only sporadically. Denies having this swelling of lower extremities no palpitations no dizziness she's rate and rhythm appears to be regular today.  Past Medical History:  Diagnosis Date  . Anginal pain ([place])   . Arthritis   . Coronary artery disease   . Hyperlipidemia   . Hypertension   . Myocardial infarction [facility])     Past Surgical History:  Procedure Laterality Date  . CORONARY ARTERY BYPASS GRAFT N/A 09/28/2016   Procedure: CORONARY ARTERY BYPASS GRAFTING (CABG) x four, using left internal mammary artery and right leg greater saphenous vein harvested endoscopically;  Surgeon: Ivin Poot, MD;  Location: [place];  Service: Open Heart Surgery;  Laterality: N/A;  . IR THORACENTESIS ASP PLEURAL SPACE W/IMG GUIDE  10/06/2016  . LEFT HEART CATH AND CORONARY ANGIOGRAPHY N/A 09/24/2016   Procedure: Left Heart Cath and Coronary Angiography;  Surgeon: Lorretta Harp, MD;  Location: [place] CV LAB;  Service: Cardiovascular;  Laterality: N/A;  . TEE  WITHOUT CARDIOVERSION N/A 09/28/2016   Procedure: TRANSESOPHAGEAL ECHOCARDIOGRAM (TEE);  Surgeon: Prescott Gum, Collier Salina, MD;  Location: [place];  Service: Open Heart Surgery;  Laterality: N/A;    Current Medications: Current Meds  Medication Sig  . amiodarone (PACERONE) 200 MG tablet Take 1 tablet (200 mg total) by mouth daily.  Marland Kitchen aspirin EC 81 MG EC tablet Take 1 tablet (81 mg total) by mouth daily.  Marland Kitchen atorvastatin (LIPITOR) 80 MG tablet Take 1 tablet (80 mg total) by mouth daily at 6 PM.  . guaiFENesin (MUCINEX) 600 MG 12 hr tablet Take 600 mg by mouth daily.  . hydrOXYzine (ATARAX/VISTARIL) 25 MG tablet Take 25-50 mg by mouth at bedtime. For itching  . metoprolol tartrate (LOPRESSOR) 25 MG tablet Take 0.5 tablets (12.5 mg total) by mouth 2 (two) times daily.  Marland Kitchen oxyCODONE (ROXICODONE) 5 MG immediate release tablet Take 1 tablet (5 mg total) by mouth every 6 (six) hours as needed for severe pain.  . pantoprazole (PROTONIX) 40 MG tablet Take 40 mg by mouth 2 (two) times daily.  Marland Kitchen UNABLE TO FIND Med Name: CBD  . vitamin B-12 (CYANOCOBALAMIN) 1000 MCG tablet Take 1,000 mcg by mouth daily.  Marland Kitchen warfarin (COUMADIN) 1 MG tablet Take 1 tablet (1 mg total) by mouth daily at 6 PM. As directed by the coumadin clinic     Allergies:   Benadryl [diphenhydramine hcl (sleep)] and Levofloxacin   Social History   Social History  . Marital status: Married    Spouse  name: N/A  . Number of children: N/A  . Years of education: N/A   Social History Main Topics  . Smoking status: Never Smoker  . Smokeless tobacco: Never Used  . Alcohol use No  . Drug use: No  . Sexual activity: Yes    Birth control/ protection: None   Other Topics Concern  . None   Social History Narrative  . None     Family History: The patient's family history includes Heart attack in his father; Heart failure in his mother. ROS:   Please see the history of present illness.     All other systems reviewed and are  negative.  EKGs/Labs/Other Studies Reviewed:      Recent Labs: 09/26/2016: TSH 2.023 09/29/2016: Magnesium 2.2 10/03/2016: ALT 23 10/07/2016: Hemoglobin 10.4; Platelets 239 10/10/2016: BUN 18; Creatinine, Ser 1.26; Potassium 4.1; Sodium 134  Recent Lipid Panel    Component Value Date/Time   CHOL 116 09/26/2016 0232   TRIG 103 09/26/2016 0232   HDL 34 (L) 09/26/2016 0232   CHOLHDL 3.4 09/26/2016 0232   VLDL 21 09/26/2016 0232   LDLCALC 61 09/26/2016 0232    Physical Exam:    VS:  BP 130/66   Pulse 72   Resp 10   Ht 5\' 6"  (1.676 m)   Wt 187 lb (84.8 kg)   BMI 30.18 kg/m     Wt Readings from Last 3 Encounters:  10/22/16 187 lb (84.8 kg)  10/10/16 198 lb 8 oz (90 kg)     GEN:  Well nourished, well developed in no acute distress HEENT: Normal NECK: No JVD; No carotid bruits LYMPHATICS: No lymphadenopathy CARDIAC: RRR, no murmurs, no rubs, no gallops RESPIRATORY:  Clear to auscultation without rales, wheezing or rhonchi  ABDOMEN: Soft, non-tender, non-distended MUSCULOSKELETAL:  No edema; No deformity  SKIN: Warm and dry LOWER EXTREMITIES: no swelling NEUROLOGIC:  Alert and oriented x 3 PSYCHIATRIC:  Normal affect  Wound after sternotomy looks good. He'll completely. Sternum is stable.  ASSESSMENT:    1. PAF (paroxysmal atrial fibrillation) ([place])   2. S/P CABG x 4   3. Coronary artery disease involving native coronary artery of native heart without angina pectoris   4. Essential hypertension   5. Ischemic cardiomyopathy    PLAN:    In order of problems listed above:  1. Coronary artery disease, status post recent non-STEMI. He required bypass surgery thereafter. Recovering. He did have somewhat difficult course while in the hospital but seems to be recovering nicely right now. Since review all medications discussed. We talked about precautions in terms of his sternum. I strongly recommended to join rehabilitation at our hospital. He will be referred there. I told  him that he is doing quite well considering the fact that he had difficult course while in the hospital. 2. Paroxysmal atrial fibrillation after bypass surgery he is on amiodarone and Coumadin. Coumadin is being adjusted in our Coumadin clinic. Denies having any palpitations. Intervention will be to continue with medications for about 3 months and see how things are going. 3. Cardiomyopathy which is ischemic in origin. His ejection fraction originally was about 45% however echocardiogram done after bypass surgery showed improvement in left ventricular ejection fraction and actually left ventricle was hyperdynamic at that time. 4. Dyslipidemia he is on atorvastatin which I will continue 5. 1 he'll be here will repeat he's fasting lipid profile to make sure that it's acceptable level.   Medication Adjustments/Labs and Tests Ordered: Current medicines are reviewed  at length with the patient today.  Concerns regarding medicines are outlined above.  No orders of the defined types were placed in this encounter.  Medication changes: No orders of the defined types were placed in this encounter.   Signed, Jenne Campus, MD  10/22/2016 2:30 PM    [place]

## 2016-10-22 NOTE — Patient Instructions (Signed)
Medication Instructions:  Your physician recommends that you continue on your current medications as directed. Please refer to the Current Medication list given to you today.   Labwork: none Testing/Procedures: ECG performed in office today.   Follow-Up: Your physician recommends that you schedule a follow-up appointment in:1 month   Any Other Special Instructions Will Be Listed Below (If Applicable).     If you need a refill on your cardiac medications before your next appointment, please call your pharmacy.

## 2016-10-26 LAB — POCT INR: INR: 1.8

## 2016-10-28 ENCOUNTER — Telehealth: Payer: Self-pay | Admitting: Pharmacist

## 2016-10-28 ENCOUNTER — Ambulatory Visit (INDEPENDENT_AMBULATORY_CARE_PROVIDER_SITE_OTHER): Payer: BLUE CROSS/BLUE SHIELD | Admitting: Pharmacist

## 2016-10-28 DIAGNOSIS — Z5181 Encounter for therapeutic drug level monitoring: Secondary | ICD-10-CM

## 2016-10-28 DIAGNOSIS — I480 Paroxysmal atrial fibrillation: Secondary | ICD-10-CM

## 2016-10-28 NOTE — Telephone Encounter (Signed)
Pt was due to have INR drawn with [facility] on Monday 10/26/16.  Spoke with Santiago Glad, nurse with [facility] who reports that she was off Monday, but there was an order for PT/INR to be drawn on Monday. She is going to investigate and call back.

## 2016-10-28 NOTE — Telephone Encounter (Signed)
See anticoag encounter from 10/28/16 for details.

## 2016-11-02 LAB — PROTIME-INR: INR: 2.6 — AB (ref <=1.1)

## 2016-11-03 ENCOUNTER — Ambulatory Visit (INDEPENDENT_AMBULATORY_CARE_PROVIDER_SITE_OTHER): Payer: Self-pay | Admitting: Internal Medicine

## 2016-11-03 DIAGNOSIS — Z5181 Encounter for therapeutic drug level monitoring: Secondary | ICD-10-CM

## 2016-11-03 DIAGNOSIS — I480 Paroxysmal atrial fibrillation: Secondary | ICD-10-CM

## 2016-11-11 ENCOUNTER — Ambulatory Visit: Payer: Medicare Other | Admitting: Cardiothoracic Surgery

## 2016-11-12 ENCOUNTER — Other Ambulatory Visit: Payer: Self-pay | Admitting: Cardiothoracic Surgery

## 2016-11-12 DIAGNOSIS — Z951 Presence of aortocoronary bypass graft: Secondary | ICD-10-CM

## 2016-11-16 ENCOUNTER — Ambulatory Visit (INDEPENDENT_AMBULATORY_CARE_PROVIDER_SITE_OTHER): Payer: Self-pay | Admitting: Physician Assistant

## 2016-11-16 ENCOUNTER — Ambulatory Visit (INDEPENDENT_AMBULATORY_CARE_PROVIDER_SITE_OTHER): Payer: BLUE CROSS/BLUE SHIELD | Admitting: *Deleted

## 2016-11-16 ENCOUNTER — Ambulatory Visit
Admission: RE | Admit: 2016-11-16 | Discharge: 2016-11-16 | Disposition: A | Discharge status: Home / Self Care | Payer: BLUE CROSS/BLUE SHIELD | Source: Ambulatory Visit | Attending: Cardiothoracic Surgery | Admitting: Cardiothoracic Surgery

## 2016-11-16 ENCOUNTER — Telehealth: Payer: Self-pay | Admitting: Cardiovascular Disease

## 2016-11-16 VITALS — BP 147/84 | HR 72 | Resp 20 | Ht 66.0 in | Wt 195.0 lb

## 2016-11-16 DIAGNOSIS — Z951 Presence of aortocoronary bypass graft: Secondary | ICD-10-CM

## 2016-11-16 DIAGNOSIS — I480 Paroxysmal atrial fibrillation: Secondary | ICD-10-CM

## 2016-11-16 DIAGNOSIS — Z5181 Encounter for therapeutic drug level monitoring: Secondary | ICD-10-CM

## 2016-11-16 LAB — POCT INR: INR: 3.5

## 2016-11-16 MED ORDER — WARFARIN SODIUM 2 MG PO TABS: 2.0000 mg | ORAL_TABLET | Freq: Every day | ORAL | 1 refills | Status: DC

## 2016-11-16 NOTE — Telephone Encounter (Signed)
New message       Calling to check the status of a cardiac rehab order.  It was faxed to Dr Gwenlyn Found last Friday.

## 2016-11-16 NOTE — Telephone Encounter (Signed)
Called Ernest Booker @ [facility] to notify her that patient's primary cardiologist is Dr. Agustin Cree and rehab orders should likely be deferred to him to be signed off on. She states original order came from Dr. Gwenlyn Found (from hospital likely). Informed her that I would send message to MD/CMA for follow up

## 2016-11-16 NOTE — Progress Notes (Signed)
[address]       ,[place] [address]             [phone]                  Wilkins Rosenfield Woodson Medical Record #[id number] Date of Birth: 1945/11/10  Referring XN:[id number], Pearletha Forge, MD Primary Cardiology: Dr. Agustin Cree Primary Care:Scott, Gwenyth Bouillon, MD  Chief Complaint:  Follow Up Visit  History of Present Illness:     This is a 71 year old Caucasian male who is s/p CABG x 4 on 09/28/2016 by Dr. Prescott Gum. He presents today for routing post op follow up. He denies chest pain or shortness of breath.    Current Outpatient Prescriptions  Medication Sig Dispense Refill  . amiodarone (PACERONE) 200 MG tablet Take 1 tablet (200 mg total) by mouth daily. 30 tablet 1  . aspirin EC 81 MG EC tablet Take 1 tablet (81 mg total) by mouth daily.    Marland Kitchen atorvastatin (LIPITOR) 80 MG tablet Take 1 tablet (80 mg total) by mouth daily at 6 PM. 30 tablet 1  . hydrOXYzine (ATARAX/VISTARIL) 25 MG tablet Take 25-50 mg by mouth at bedtime. For itching  1  . metoprolol tartrate (LOPRESSOR) 25 MG tablet Take 0.5 tablets (12.5 mg total) by mouth 2 (two) times daily. 30 tablet 1  . oxyCODONE (ROXICODONE) 5 MG immediate release tablet Take 1 tablet (5 mg total) by mouth every 6 (six) hours as needed for severe pain. 30 tablet 0  . pantoprazole (PROTONIX) 40 MG tablet Take 40 mg by mouth 2 (two) times daily.  3  . UNABLE TO FIND Med Name: CBD    . vitamin B-12 (CYANOCOBALAMIN) 1000 MCG tablet Take 1,000 mcg by mouth daily.    Marland Kitchen warfarin (COUMADIN) 1 MG tablet Take 1 tablet (1 mg total) by mouth daily at 6 PM. As directed by the coumadin clinic 100 tablet 1   Physical Exam: BP (!) 147/84   Pulse 72   Resp 20   Ht 5\' 6"  (1.676 m)   Wt 88.5 kg (195 lb)   SpO2 98% Comment: RA  BMI 31.47 kg/m   General appearance: alert, cooperative and no distress Neurologic: intact Heart: RRR Lungs: clear to auscultation bilaterally Extremities: No LE edema Wound: Both sternal and RLE wounds  are clean and dry. No sign of infection   Diagnostic Studies & Laboratory data:         Recent Radiology Findings: Dg Chest 2 View  Result Date: 11/16/2016 CLINICAL DATA:  Recent CABG. EXAM: CHEST  2 VIEW COMPARISON:  Chest x-ray dated October 10, 2016. FINDINGS: Interval removal of right-sided PICC catheter. Postsurgical changes related to prior CABG again noted. Cardiomediastinal silhouette remains mildly enlarged, unchanged. Interval decrease in size of left pleural effusion, now small. Right pleural effusion has nearly resolved. No focal consolidation. No pneumothorax. No acute osseous abnormality. IMPRESSION: 1. Postsurgical changes from prior CABG with interval decrease in size of left pleural effusion, now small. Nearly resolved right pleural effusion. Electronically Signed   By: Titus Dubin M.D.   On: 11/16/2016 13:43      I have independently reviewed the above radiology findings and reviewed findings  with the patient.  Recent Labs: Lab Results  Component Value Date   WBC 11.5 (H) 10/07/2016   HGB 10.4 (L) 10/07/2016   HCT 31.9 (L) 10/07/2016   PLT 239 10/07/2016   GLUCOSE 84 10/10/2016  CHOL 116 09/26/2016   TRIG 103 09/26/2016   HDL 34 (L) 09/26/2016   LDLCALC 61 09/26/2016   ALT 23 10/03/2016   AST 28 10/03/2016   NA 134 (L) 10/10/2016   K 4.1 10/10/2016   CL 101 10/10/2016   CREATININE 1.26 (H) 10/10/2016   BUN 18 10/10/2016   CO2 27 10/10/2016   TSH 2.023 09/26/2016   INR 2.6 (A) 11/02/2016   HGBA1C 5.7 (H) 09/29/2016   Assessment / Plan:   Overall, Mr. Mcglory is recovering well from coronary artery bypass grafting surgery. His systolic BP is elevated at this exam (147), but he informed me that when he left cardiac rehab earlier, his systolic was 734. I told him if it is elevated at his next visit, will consider starting Lisinopril. He does have an appointment to see Dr. Agustin Cree on 11/25/2016 at 1:20 pm. Hopefully, Amiodarone and Coumadin (post op PAF) will be  able to be stopped in the near future.He has an appointment to have his PT and INR drawn today at 3:15 pm. His last INR was on 11/02/2016 and was 2.6. He asked for a prescription for Coumadin as he is almost out, which I gave him. He is not taking narcotics for pain and is already driving. I instructed him he may continue to drive during the day for 30 minutes or less, and next week, resume normal driving activities. He was instructed to continue with sternal precautions (i.e. no lifting more than 10 pounds) for the next 1-2 weeks. He may then return to normal activities. He is already participating in cardiac rehab, which I encouraged him to continue. He will return to see Dr. Prescott Gum in 4 weeks.         Jwan Hornbaker M PA-C 11/16/2016 2:01 PM

## 2016-11-16 NOTE — Patient Instructions (Signed)
You are encouraged to enroll and participate in the outpatient cardiac rehab program beginning as soon as practical.   You may return to driving an automobile as long as you are no longer requiring oral narcotic pain relievers during the daytime.  It would be wise to start driving only short distances during the daylight and gradually increase from there as you feel comfortable.   You may continue to gradually increase your physical activity as tolerated.  Refrain from any heavy lifting or strenuous use of your arms and shoulders until at least 8 weeks from the time of your surgery, and avoid activities that cause increased pain in your chest on the side of your surgical incision.  Otherwise you may continue to increase activities without any particular limitations.  Increase the intensity and duration of physical activity gradually.  

## 2016-11-23 NOTE — Telephone Encounter (Signed)
Faxed form to Dr. Wendy Poet office at [phone]

## 2016-11-25 ENCOUNTER — Encounter: Payer: Self-pay | Admitting: Cardiology

## 2016-11-25 ENCOUNTER — Ambulatory Visit (INDEPENDENT_AMBULATORY_CARE_PROVIDER_SITE_OTHER): Payer: BLUE CROSS/BLUE SHIELD | Admitting: Cardiology

## 2016-11-25 VITALS — BP 118/70 | HR 88 | Resp 12 | Ht 66.0 in | Wt 195.1 lb

## 2016-11-25 DIAGNOSIS — I2510 Atherosclerotic heart disease of native coronary artery without angina pectoris: Secondary | ICD-10-CM | POA: Diagnosis not present

## 2016-11-25 DIAGNOSIS — I10 Essential (primary) hypertension: Secondary | ICD-10-CM

## 2016-11-25 DIAGNOSIS — E782 Mixed hyperlipidemia: Secondary | ICD-10-CM

## 2016-11-25 DIAGNOSIS — I480 Paroxysmal atrial fibrillation: Secondary | ICD-10-CM | POA: Diagnosis not present

## 2016-11-25 DIAGNOSIS — Z951 Presence of aortocoronary bypass graft: Secondary | ICD-10-CM

## 2016-11-25 NOTE — Patient Instructions (Signed)
Medication Instructions:  Your physician recommends that you continue on your current medications as directed. Please refer to the Current Medication list given to you today.  Labwork: Your physician recommends that you have your cholesterol checked today.   Testing/Procedures: None   Follow-Up: Your physician recommends that you schedule a follow-up appointment in: 3 months   Any Other Special Instructions Will Be Listed Below (If Applicable).  Please note that any paperwork needing to be filled out by the provider will need to be addressed at the front desk prior to seeing the provider. Please note that any paperwork FMLA, Disability or other documents regarding health condition is subject to a $25.00 charge that must be received prior to completion of paperwork.    If you need a refill on your cardiac medications before your next appointment, please call your pharmacy.

## 2016-11-25 NOTE — Progress Notes (Signed)
Cardiology Office Note:    Date:  11/25/2016   ID:  Ernest Booker, DOB 1945/10/21, MRN [id number]  PCP:  Myer Peer, MD  Cardiologist:  Jenne Campus, MD    Referring MD: Myer Peer, MD   Chief Complaint  Patient presents with  . 1 month follow up  After bypass surgery  History of Present Illness:    Ernest Booker is a 71 y.o. male  with coronary artery disease status post recent bypass surgery. He is recovering from his surgery. Gradually feels better. He participate in rehabilitation and doing well. Few days ago he was walking and tripped on the sidewalk and fell down on he is buttock. There is some bruise but no other issues. Denies having the chest pain tightness squeezing pressure burning chest. He does not take pain medications anymore. Denies having any palpitations. Overall I think he is doing well.  Past Medical History:  Diagnosis Date  . Anginal pain ([place])   . Arthritis   . Coronary artery disease   . Hyperlipidemia   . Hypertension   . Myocardial infarction [facility])     Past Surgical History:  Procedure Laterality Date  . CORONARY ARTERY BYPASS GRAFT N/A 09/28/2016   Procedure: CORONARY ARTERY BYPASS GRAFTING (CABG) x four, using left internal mammary artery and right leg greater saphenous vein harvested endoscopically;  Surgeon: Ivin Poot, MD;  Location: [place];  Service: Open Heart Surgery;  Laterality: N/A;  . IR THORACENTESIS ASP PLEURAL SPACE W/IMG GUIDE  10/06/2016  . LEFT HEART CATH AND CORONARY ANGIOGRAPHY N/A 09/24/2016   Procedure: Left Heart Cath and Coronary Angiography;  Surgeon: Lorretta Harp, MD;  Location: [place] CV LAB;  Service: Cardiovascular;  Laterality: N/A;  . TEE WITHOUT CARDIOVERSION N/A 09/28/2016   Procedure: TRANSESOPHAGEAL ECHOCARDIOGRAM (TEE);  Surgeon: Prescott Gum, Collier Salina, MD;  Location: [place];  Service: Open Heart Surgery;  Laterality: N/A;    Current Medications: Current Meds  Medication Sig  . amiodarone (PACERONE)  200 MG tablet Take 1 tablet (200 mg total) by mouth daily.  Marland Kitchen aspirin EC 81 MG EC tablet Take 1 tablet (81 mg total) by mouth daily.  Marland Kitchen atorvastatin (LIPITOR) 80 MG tablet Take 1 tablet (80 mg total) by mouth daily at 6 PM.  . hydrOXYzine (ATARAX/VISTARIL) 25 MG tablet Take 25-50 mg by mouth at bedtime. For itching  . metoprolol tartrate (LOPRESSOR) 25 MG tablet Take 0.5 tablets (12.5 mg total) by mouth 2 (two) times daily.  Marland Kitchen oxyCODONE (ROXICODONE) 5 MG immediate release tablet Take 1 tablet (5 mg total) by mouth every 6 (six) hours as needed for severe pain.  . pantoprazole (PROTONIX) 40 MG tablet Take 40 mg by mouth 2 (two) times daily.  Marland Kitchen UNABLE TO FIND Med Name: CBD  . vitamin B-12 (CYANOCOBALAMIN) 1000 MCG tablet Take 1,000 mcg by mouth daily.  Marland Kitchen warfarin (COUMADIN) 2 MG tablet Take 1 tablet (2 mg total) by mouth daily at 6 PM. Every day except Sunday (take 1 mg) or Aas directed by the coumadin clinic     Allergies:   Benadryl [diphenhydramine hcl (sleep)] and Levofloxacin   Social History   Social History  . Marital status: Married    Spouse name: N/A  . Number of children: N/A  . Years of education: N/A   Social History Main Topics  . Smoking status: Never Smoker  . Smokeless tobacco: Never Used  . Alcohol use No  . Drug use: No  . Sexual  activity: Yes    Birth control/ protection: None   Other Topics Concern  . None   Social History Narrative  . None     Family History: The patient's family history includes Heart attack in his father; Heart failure in his mother. ROS:   Please see the history of present illness.    All 14 point review of systems negative except as described per history of present illness  EKGs/Labs/Other Studies Reviewed:      Recent Labs: 09/26/2016: TSH 2.023 09/29/2016: Magnesium 2.2 10/03/2016: ALT 23 10/07/2016: Hemoglobin 10.4; Platelets 239 10/10/2016: BUN 18; Creatinine, Ser 1.26; Potassium 4.1; Sodium 134  Recent Lipid Panel      Component Value Date/Time   CHOL 116 09/26/2016 0232   TRIG 103 09/26/2016 0232   HDL 34 (L) 09/26/2016 0232   CHOLHDL 3.4 09/26/2016 0232   VLDL 21 09/26/2016 0232   LDLCALC 61 09/26/2016 0232    Physical Exam:    VS:  BP 118/70   Pulse 88   Resp 12   Ht 5\' 6"  (1.676 m)   Wt 195 lb 1.9 oz (88.5 kg)   BMI 31.49 kg/m     Wt Readings from Last 3 Encounters:  11/25/16 195 lb 1.9 oz (88.5 kg)  11/16/16 195 lb (88.5 kg)  10/22/16 187 lb (84.8 kg)     GEN:  Well nourished, well developed in no acute distress HEENT: Normal NECK: No JVD; No carotid bruits LYMPHATICS: No lymphadenopathy CARDIAC: RRR, no murmurs, no rubs, no gallops RESPIRATORY:  Clear to auscultation without rales, wheezing or rhonchi  ABDOMEN: Soft, non-tender, non-distended MUSCULOSKELETAL:  No edema; No deformity  SKIN: Warm and dry LOWER EXTREMITIES: no swelling NEUROLOGIC:  Alert and oriented x 3 PSYCHIATRIC:  Normal affect   ASSESSMENT:    1. PAF (paroxysmal atrial fibrillation) ([place])   2. Mixed hyperlipidemia   3. Hx of CABG   4. Coronary artery disease involving native coronary artery of native heart without angina pectoris   5. Essential hypertension    PLAN:    In order of problems listed above:  1. Paroxysmal mitral fibrillation: After surgery, he is on amiodarone 200 mg daily which I will continue for now, he is also Coumadin with therapeutic: Now. We will continue that management for 3 months after bypass surgery I will discontinue his amiodarone. After that we'll see how he will do. 2. Status post coronary artery bypass graft: Wound is healed completely he is recovering nicely 3. Essential hypertension: His blood pressure is well-controlled will continue present management. 4. Mixed dyslipidemia: On high intensity statin which I will continue. His last fasting lipid profile was good. 5. Ischemic cardiomyopathy: I will check his Chem-7 if Chem-7 is acceptable I will reinitiate lisinopril  2.5 daily.   Medication Adjustments/Labs and Tests Ordered: Current medicines are reviewed at length with the patient today.  Concerns regarding medicines are outlined above.  No orders of the defined types were placed in this encounter.  Medication changes: No orders of the defined types were placed in this encounter.   Signed, Park Liter, MD, [facility] 11/25/2016 1:48 PM    [facility]

## 2016-11-26 LAB — BASIC METABOLIC PANEL
BUN / CREAT RATIO: 14 (ref 10–24)
BUN: 18 mg/dL (ref 8–27)
CALCIUM: 8.7 mg/dL (ref 8.6–10.2)
CHLORIDE: 105 mmol/L (ref 96–106)
CO2: 23 mmol/L (ref 20–29)
Creatinine, Ser: 1.3 mg/dL — ABNORMAL HIGH (ref 0.76–1.27)
GFR, EST AFRICAN AMERICAN: 65 mL/min/{1.73_m2} (ref >59)
GFR, EST NON AFRICAN AMERICAN: 56 mL/min/{1.73_m2} — AB (ref >59)
Glucose: 106 mg/dL — ABNORMAL HIGH (ref 65–99)
Potassium: 4.1 mmol/L (ref 3.5–5.2)
Sodium: 140 mmol/L (ref 134–144)

## 2016-11-27 ENCOUNTER — Telehealth: Payer: Self-pay

## 2016-11-27 DIAGNOSIS — Z79899 Other long term (current) drug therapy: Secondary | ICD-10-CM

## 2016-11-27 MED ORDER — LISINOPRIL 2.5 MG PO TABS: 2.5000 mg | ORAL_TABLET | Freq: Every day | ORAL | 3 refills | Status: DC

## 2016-11-27 NOTE — Telephone Encounter (Signed)
Wife advised of the med start and will have labs rechecked when they come to the office for pt/inr check. Order has been placed.

## 2016-11-30 ENCOUNTER — Other Ambulatory Visit: Payer: Self-pay | Admitting: Cardiothoracic Surgery

## 2016-11-30 ENCOUNTER — Telehealth: Payer: Self-pay

## 2016-11-30 DIAGNOSIS — I480 Paroxysmal atrial fibrillation: Secondary | ICD-10-CM

## 2016-11-30 MED ORDER — ASPIRIN 81 MG PO TBEC: 81.0000 mg | DELAYED_RELEASE_TABLET | Freq: Every day | ORAL | 11 refills | Status: DC

## 2016-11-30 MED ORDER — AMIODARONE HCL 200 MG PO TABS: 200.0000 mg | ORAL_TABLET | Freq: Every day | ORAL | 11 refills | Status: DC

## 2016-11-30 MED ORDER — METOPROLOL TARTRATE 25 MG PO TABS: 12.5000 mg | ORAL_TABLET | Freq: Two times a day (BID) | ORAL | 11 refills | Status: DC

## 2016-11-30 MED ORDER — HYDROXYZINE HCL 25 MG PO TABS: 25.0000 mg | ORAL_TABLET | Freq: Every day | ORAL | 1 refills | Status: DC

## 2016-11-30 MED ORDER — ATORVASTATIN CALCIUM 80 MG PO TABS: 80.0000 mg | ORAL_TABLET | Freq: Every day | ORAL | 1 refills | Status: DC

## 2016-11-30 NOTE — Telephone Encounter (Signed)
Patient in for labs and needed refills. Refills have been sent in.

## 2016-12-01 ENCOUNTER — Ambulatory Visit (INDEPENDENT_AMBULATORY_CARE_PROVIDER_SITE_OTHER): Payer: BLUE CROSS/BLUE SHIELD | Admitting: Pharmacist

## 2016-12-01 ENCOUNTER — Other Ambulatory Visit: Payer: Self-pay | Admitting: Cardiothoracic Surgery

## 2016-12-01 DIAGNOSIS — Z5181 Encounter for therapeutic drug level monitoring: Secondary | ICD-10-CM

## 2016-12-01 DIAGNOSIS — I480 Paroxysmal atrial fibrillation: Secondary | ICD-10-CM

## 2016-12-01 LAB — BASIC METABOLIC PANEL
BUN / CREAT RATIO: 16 (ref 10–24)
BUN: 22 mg/dL (ref 8–27)
CALCIUM: 9.2 mg/dL (ref 8.6–10.2)
CHLORIDE: 106 mmol/L (ref 96–106)
CO2: 22 mmol/L (ref 20–29)
Creatinine, Ser: 1.36 mg/dL — ABNORMAL HIGH (ref 0.76–1.27)
GFR calc Af Amer: 61 mL/min/{1.73_m2} (ref >59)
GFR calc non Af Amer: 53 mL/min/{1.73_m2} — ABNORMAL LOW (ref >59)
GLUCOSE: 66 mg/dL (ref 65–99)
Potassium: 4.3 mmol/L (ref 3.5–5.2)
Sodium: 139 mmol/L (ref 134–144)

## 2016-12-01 LAB — PROTIME-INR
INR: 1.8 — AB (ref 0.8–1.2)
PROTHROMBIN TIME: 17.9 s — AB (ref 9.1–12.0)

## 2016-12-01 NOTE — Telephone Encounter (Signed)
Ok to refill lipitor

## 2016-12-03 ENCOUNTER — Other Ambulatory Visit: Payer: Self-pay

## 2016-12-03 MED ORDER — LISINOPRIL 2.5 MG PO TABS: 2.5000 mg | ORAL_TABLET | Freq: Every day | ORAL | 3 refills | Status: DC

## 2016-12-03 MED ORDER — ATORVASTATIN CALCIUM 80 MG PO TABS: 80.0000 mg | ORAL_TABLET | Freq: Every day | ORAL | 3 refills | Status: DC

## 2016-12-03 MED ORDER — METOPROLOL TARTRATE 25 MG PO TABS: 12.5000 mg | ORAL_TABLET | Freq: Two times a day (BID) | ORAL | 3 refills | Status: DC

## 2016-12-03 MED ORDER — AMIODARONE HCL 200 MG PO TABS: 200.0000 mg | ORAL_TABLET | Freq: Every day | ORAL | 3 refills | Status: DC

## 2016-12-14 ENCOUNTER — Ambulatory Visit (INDEPENDENT_AMBULATORY_CARE_PROVIDER_SITE_OTHER): Payer: BLUE CROSS/BLUE SHIELD | Admitting: Pharmacist

## 2016-12-14 DIAGNOSIS — I480 Paroxysmal atrial fibrillation: Secondary | ICD-10-CM | POA: Diagnosis not present

## 2016-12-14 DIAGNOSIS — Z5181 Encounter for therapeutic drug level monitoring: Secondary | ICD-10-CM

## 2016-12-14 LAB — POCT INR: INR: 2.2

## 2016-12-16 ENCOUNTER — Ambulatory Visit (INDEPENDENT_AMBULATORY_CARE_PROVIDER_SITE_OTHER): Payer: Self-pay | Admitting: Cardiothoracic Surgery

## 2016-12-16 ENCOUNTER — Encounter: Payer: Self-pay | Admitting: Cardiothoracic Surgery

## 2016-12-16 VITALS — BP 137/84 | HR 61 | Resp 16 | Ht 66.0 in | Wt 198.2 lb

## 2016-12-16 DIAGNOSIS — Z951 Presence of aortocoronary bypass graft: Secondary | ICD-10-CM

## 2016-12-16 MED ORDER — LISINOPRIL 2.5 MG PO TABS: 2.5000 mg | ORAL_TABLET | Freq: Every day | ORAL | 3 refills | Status: DC

## 2016-12-16 NOTE — Progress Notes (Signed)
PCP is Ernest Peer, MD Referring Provider is Lorretta Harp, MD  Chief Complaint  Patient presents with  . Routine Post Op    4 wk f/u s/p CABG X 4..09/18/16    HPI: Almost 3 months after CABG 4 Patient has no symptoms of angina or heart failure Surgical incisions well-healed Patient attending outpatient cardiac rehabilitation and progressing Patient released from all restrictions and lifting limits after mid-September  Past Medical History:  Diagnosis Date  . Anginal pain ([place])   . Arthritis   . Coronary artery disease   . Hyperlipidemia   . Hypertension   . Myocardial infarction [facility])     Past Surgical History:  Procedure Laterality Date  . CORONARY ARTERY BYPASS GRAFT N/A 09/28/2016   Procedure: CORONARY ARTERY BYPASS GRAFTING (CABG) x four, using left internal mammary artery and right leg greater saphenous vein harvested endoscopically;  Surgeon: Ivin Poot, MD;  Location: [place];  Service: Open Heart Surgery;  Laterality: N/A;  . IR THORACENTESIS ASP PLEURAL SPACE W/IMG GUIDE  10/06/2016  . LEFT HEART CATH AND CORONARY ANGIOGRAPHY N/A 09/24/2016   Procedure: Left Heart Cath and Coronary Angiography;  Surgeon: Lorretta Harp, MD;  Location: [place] CV LAB;  Service: Cardiovascular;  Laterality: N/A;  . TEE WITHOUT CARDIOVERSION N/A 09/28/2016   Procedure: TRANSESOPHAGEAL ECHOCARDIOGRAM (TEE);  Surgeon: Prescott Gum, Collier Salina, MD;  Location: [place];  Service: Open Heart Surgery;  Laterality: N/A;    Family History  Problem Relation Age of Onset  . Heart failure Mother   . Heart attack Father     Social History Social History  Substance Use Topics  . Smoking status: Never Smoker  . Smokeless tobacco: Never Used  . Alcohol use No    Current Outpatient Prescriptions  Medication Sig Dispense Refill  . aspirin 81 MG EC tablet Take 1 tablet (81 mg total) by mouth daily. 30 tablet 11  . atorvastatin (LIPITOR) 80 MG tablet Take 1 tablet (80 mg total) by mouth  daily. 90 tablet 3  . escitalopram (LEXAPRO) 10 MG tablet Take 1 tablet by mouth daily.    Marland Kitchen lisinopril (PRINIVIL,ZESTRIL) 2.5 MG tablet Take 1 tablet (2.5 mg total) by mouth daily. 90 tablet 3  . metoprolol tartrate (LOPRESSOR) 25 MG tablet Take 0.5 tablets (12.5 mg total) by mouth 2 (two) times daily. 180 tablet 3  . pantoprazole (PROTONIX) 40 MG tablet Take 40 mg by mouth 2 (two) times daily.  3  . UNABLE TO FIND Med Name: CBD    . vitamin B-12 (CYANOCOBALAMIN) 1000 MCG tablet Take 1,000 mcg by mouth daily.     No current facility-administered medications for this visit.     Allergies  Allergen Reactions  . Benadryl [Diphenhydramine Hcl (Sleep)]     UNSPECIFIED REACTION   . Levofloxacin     UNSPECIFIED REACTION     Review of Systems  No complaints  BP 137/84 (BP Location: Right Arm, Patient Position: Sitting, Cuff Size: Large)   Pulse 61   Resp 16   Ht 5\' 6"  (1.676 m)   Wt 198 lb 3.2 oz (89.9 kg)   SpO2 94% Comment: RA  BMI 31.99 kg/m  Physical Exam      Exam    General- alert and comfortable   Lungs- clear without rales, wheezes   Cor- regular rate and rhythm, no murmur , gallop   Abdomen- soft, non-tender   Extremities - warm, non-tender, minimal edema   Neuro- oriented, appropriate, no  focal weakness   Diagnostic Tests: Last chest x-ray personally reviewed and is clear  Impression: Excellent recovery after multivessel CABG  Plan: Continue current medications Complete cardiac rehabilitation program Return as needed  Len Childs, MD [facility] [phone]

## 2017-02-10 DIAGNOSIS — E785 Hyperlipidemia, unspecified: Secondary | ICD-10-CM

## 2017-02-10 DIAGNOSIS — I25810 Atherosclerosis of coronary artery bypass graft(s) without angina pectoris: Secondary | ICD-10-CM | POA: Diagnosis not present

## 2017-02-10 DIAGNOSIS — R079 Chest pain, unspecified: Secondary | ICD-10-CM

## 2017-02-10 DIAGNOSIS — I2510 Atherosclerotic heart disease of native coronary artery without angina pectoris: Secondary | ICD-10-CM

## 2017-02-10 DIAGNOSIS — I10 Essential (primary) hypertension: Secondary | ICD-10-CM

## 2017-02-11 ENCOUNTER — Encounter (HOSPITAL_COMMUNITY)
Admission: AD | Disposition: A | Discharge status: Home / Self Care | Payer: Self-pay | Source: Other Acute Inpatient Hospital | Attending: Interventional Cardiology

## 2017-02-11 ENCOUNTER — Encounter (HOSPITAL_COMMUNITY): Payer: Self-pay | Admitting: *Deleted

## 2017-02-11 ENCOUNTER — Inpatient Hospital Stay (HOSPITAL_COMMUNITY)
Admission: AD | Admit: 2017-02-11 | Discharge: 2017-02-12 | DRG: 287 | Disposition: A | Discharge status: Home / Self Care | Payer: BLUE CROSS/BLUE SHIELD | Source: Other Acute Inpatient Hospital | Attending: Cardiology | Admitting: Cardiology

## 2017-02-11 DIAGNOSIS — I25810 Atherosclerosis of coronary artery bypass graft(s) without angina pectoris: Secondary | ICD-10-CM | POA: Diagnosis not present

## 2017-02-11 DIAGNOSIS — Z7982 Long term (current) use of aspirin: Secondary | ICD-10-CM

## 2017-02-11 DIAGNOSIS — E785 Hyperlipidemia, unspecified: Secondary | ICD-10-CM | POA: Diagnosis present

## 2017-02-11 DIAGNOSIS — Z951 Presence of aortocoronary bypass graft: Secondary | ICD-10-CM

## 2017-02-11 DIAGNOSIS — I1310 Hypertensive heart and chronic kidney disease without heart failure, with stage 1 through stage 4 chronic kidney disease, or unspecified chronic kidney disease: Secondary | ICD-10-CM | POA: Diagnosis not present

## 2017-02-11 DIAGNOSIS — Z8249 Family history of ischemic heart disease and other diseases of the circulatory system: Secondary | ICD-10-CM | POA: Diagnosis not present

## 2017-02-11 DIAGNOSIS — I209 Angina pectoris, unspecified: Secondary | ICD-10-CM

## 2017-02-11 DIAGNOSIS — I10 Essential (primary) hypertension: Secondary | ICD-10-CM | POA: Diagnosis present

## 2017-02-11 DIAGNOSIS — R079 Chest pain, unspecified: Secondary | ICD-10-CM | POA: Diagnosis present

## 2017-02-11 DIAGNOSIS — I252 Old myocardial infarction: Secondary | ICD-10-CM | POA: Diagnosis not present

## 2017-02-11 DIAGNOSIS — M1990 Unspecified osteoarthritis, unspecified site: Secondary | ICD-10-CM | POA: Diagnosis present

## 2017-02-11 DIAGNOSIS — I480 Paroxysmal atrial fibrillation: Secondary | ICD-10-CM | POA: Diagnosis present

## 2017-02-11 DIAGNOSIS — Z888 Allergy status to other drugs, medicaments and biological substances status: Secondary | ICD-10-CM | POA: Diagnosis not present

## 2017-02-11 DIAGNOSIS — N182 Chronic kidney disease, stage 2 (mild): Secondary | ICD-10-CM

## 2017-02-11 DIAGNOSIS — E782 Mixed hyperlipidemia: Secondary | ICD-10-CM | POA: Diagnosis not present

## 2017-02-11 DIAGNOSIS — Z881 Allergy status to other antibiotic agents status: Secondary | ICD-10-CM

## 2017-02-11 DIAGNOSIS — I25709 Atherosclerosis of coronary artery bypass graft(s), unspecified, with unspecified angina pectoris: Secondary | ICD-10-CM | POA: Diagnosis not present

## 2017-02-11 DIAGNOSIS — I25110 Atherosclerotic heart disease of native coronary artery with unstable angina pectoris: Secondary | ICD-10-CM | POA: Diagnosis present

## 2017-02-11 DIAGNOSIS — Z79899 Other long term (current) drug therapy: Secondary | ICD-10-CM

## 2017-02-11 DIAGNOSIS — I200 Unstable angina: Secondary | ICD-10-CM | POA: Diagnosis not present

## 2017-02-11 DIAGNOSIS — I2510 Atherosclerotic heart disease of native coronary artery without angina pectoris: Secondary | ICD-10-CM | POA: Diagnosis not present

## 2017-02-11 DIAGNOSIS — D750 Familial erythrocytosis: Secondary | ICD-10-CM | POA: Diagnosis not present

## 2017-02-11 HISTORY — PX: ULTRASOUND GUIDANCE FOR VASCULAR ACCESS: SHX6516

## 2017-02-11 HISTORY — PX: LEFT HEART CATH AND CORS/GRAFTS ANGIOGRAPHY: CATH118250

## 2017-02-11 SURGERY — LEFT HEART CATH AND CORS/GRAFTS ANGIOGRAPHY
Anesthesia: LOCAL

## 2017-02-11 MED ORDER — ESCITALOPRAM OXALATE 10 MG PO TABS
10.0000 mg | ORAL_TABLET | Freq: Every day | ORAL | Status: DC
  Administered 2017-02-12: 10 mg via ORAL
  Filled 2017-02-11: qty 1

## 2017-02-11 MED ORDER — SODIUM CHLORIDE 0.9% FLUSH: 3.0000 mL | INTRAVENOUS | Status: DC | PRN

## 2017-02-11 MED ORDER — SODIUM CHLORIDE 0.9 % IV SOLN: 250.0000 mL | INTRAVENOUS | Status: DC | PRN

## 2017-02-11 MED ORDER — PANTOPRAZOLE SODIUM 40 MG PO TBEC
40.0000 mg | DELAYED_RELEASE_TABLET | Freq: Two times a day (BID) | ORAL | Status: DC
Start: 2017-02-11 — End: 2017-02-12
  Administered 2017-02-11 – 2017-02-12 (×2): 40 mg via ORAL
  Filled 2017-02-11 (×2): qty 1

## 2017-02-11 MED ORDER — SODIUM CHLORIDE 0.9% FLUSH
3.0000 mL | Freq: Two times a day (BID) | INTRAVENOUS | Status: DC
  Administered 2017-02-11: 3 mL via INTRAVENOUS

## 2017-02-11 MED ORDER — MIDAZOLAM HCL 2 MG/2ML IJ SOLN
INTRAMUSCULAR | Status: AC
  Filled 2017-02-11: qty 2

## 2017-02-11 MED ORDER — ONDANSETRON HCL 4 MG/2ML IJ SOLN: 4.0000 mg | Freq: Four times a day (QID) | INTRAMUSCULAR | Status: DC | PRN

## 2017-02-11 MED ORDER — FENTANYL CITRATE (PF) 100 MCG/2ML IJ SOLN
INTRAMUSCULAR | Status: DC | PRN
  Administered 2017-02-11: 25 ug via INTRAVENOUS

## 2017-02-11 MED ORDER — IOPAMIDOL (ISOVUE-370) INJECTION 76%
INTRAVENOUS | Status: DC | PRN
  Administered 2017-02-11: 100 mL via INTRAVENOUS

## 2017-02-11 MED ORDER — IOPAMIDOL (ISOVUE-370) INJECTION 76%
INTRAVENOUS | Status: AC
  Filled 2017-02-11: qty 100

## 2017-02-11 MED ORDER — ASPIRIN EC 81 MG PO TBEC
81.0000 mg | DELAYED_RELEASE_TABLET | Freq: Every day | ORAL | Status: DC
  Administered 2017-02-12: 81 mg via ORAL
  Filled 2017-02-11 (×2): qty 1

## 2017-02-11 MED ORDER — FENTANYL CITRATE (PF) 100 MCG/2ML IJ SOLN
INTRAMUSCULAR | Status: AC
  Filled 2017-02-11: qty 2

## 2017-02-11 MED ORDER — SODIUM CHLORIDE 0.9 % WEIGHT BASED INFUSION
1.0000 mL/kg/h | INTRAVENOUS | Status: DC
Start: 2017-02-11 — End: 2017-02-11

## 2017-02-11 MED ORDER — SODIUM CHLORIDE 0.9% FLUSH: 3.0000 mL | Freq: Two times a day (BID) | INTRAVENOUS | Status: DC

## 2017-02-11 MED ORDER — SODIUM CHLORIDE 0.9 % IV SOLN: INTRAVENOUS | Status: AC

## 2017-02-11 MED ORDER — HEPARIN (PORCINE) IN NACL 2-0.9 UNIT/ML-% IJ SOLN
INTRAMUSCULAR | Status: AC | PRN
  Administered 2017-02-11: 1000 mL

## 2017-02-11 MED ORDER — ACETAMINOPHEN 325 MG PO TABS: 650.0000 mg | ORAL_TABLET | ORAL | Status: DC | PRN

## 2017-02-11 MED ORDER — HEPARIN (PORCINE) IN NACL 2-0.9 UNIT/ML-% IJ SOLN
INTRAMUSCULAR | Status: AC
  Filled 2017-02-11: qty 1000

## 2017-02-11 MED ORDER — ISOSORBIDE MONONITRATE ER 30 MG PO TB24
30.0000 mg | ORAL_TABLET | Freq: Every day | ORAL | Status: DC
  Administered 2017-02-11 – 2017-02-12 (×2): 30 mg via ORAL
  Filled 2017-02-11 (×2): qty 1

## 2017-02-11 MED ORDER — ASPIRIN 81 MG PO CHEW: 81.0000 mg | CHEWABLE_TABLET | ORAL | Status: DC

## 2017-02-11 MED ORDER — ATORVASTATIN CALCIUM 80 MG PO TABS
80.0000 mg | ORAL_TABLET | Freq: Every day | ORAL | Status: DC
  Administered 2017-02-11 – 2017-02-12 (×2): 80 mg via ORAL
  Filled 2017-02-11 (×2): qty 1

## 2017-02-11 MED ORDER — LIDOCAINE HCL 2 % IJ SOLN
INTRAMUSCULAR | Status: DC | PRN
  Administered 2017-02-11: 15 mL

## 2017-02-11 MED ORDER — VITAMIN B-12 1000 MCG PO TABS
1000.0000 ug | ORAL_TABLET | Freq: Every day | ORAL | Status: DC
  Administered 2017-02-11 – 2017-02-12 (×2): 1000 ug via ORAL
  Filled 2017-02-11 (×2): qty 1

## 2017-02-11 MED ORDER — LIDOCAINE HCL 2 % IJ SOLN
INTRAMUSCULAR | Status: AC
  Filled 2017-02-11: qty 20

## 2017-02-11 MED ORDER — IOPAMIDOL (ISOVUE-370) INJECTION 76%
INTRAVENOUS | Status: AC
  Filled 2017-02-11: qty 50

## 2017-02-11 MED ORDER — SODIUM CHLORIDE 0.9 % WEIGHT BASED INFUSION: 3.0000 mL/kg/h | INTRAVENOUS | Status: DC

## 2017-02-11 MED ORDER — LISINOPRIL 2.5 MG PO TABS
2.5000 mg | ORAL_TABLET | Freq: Every day | ORAL | Status: DC
  Filled 2017-02-11 (×2): qty 1

## 2017-02-11 MED ORDER — METOPROLOL TARTRATE 12.5 MG HALF TABLET
12.5000 mg | ORAL_TABLET | Freq: Two times a day (BID) | ORAL | Status: DC
  Administered 2017-02-11: 12.5 mg via ORAL
  Filled 2017-02-11 (×2): qty 1

## 2017-02-11 MED ORDER — MIDAZOLAM HCL 2 MG/2ML IJ SOLN
INTRAMUSCULAR | Status: DC | PRN
  Administered 2017-02-11: 1 mg via INTRAVENOUS

## 2017-02-11 SURGICAL SUPPLY — 14 items
CATH INFINITI 5 FR IM (CATHETERS) ×3 IMPLANT
CATH INFINITI 5 FR RCB (CATHETERS) ×3 IMPLANT
CATH INFINITI 5FR AL1 (CATHETERS) ×3 IMPLANT
CATH INFINITI 5FR JL5 (CATHETERS) ×3 IMPLANT
CATH INFINITI 5FR MULTPACK ANG (CATHETERS) ×3 IMPLANT
COVER PRB 48X5XTLSCP FOLD TPE (BAG) ×2 IMPLANT
COVER PROBE 5X48 (BAG) ×1
KIT HEART LEFT (KITS) ×3 IMPLANT
PACK CARDIAC CATHETERIZATION (CUSTOM PROCEDURE TRAY) ×3 IMPLANT
SHEATH PINNACLE 5F 10CM (SHEATH) ×3 IMPLANT
TRANSDUCER W/STOPCOCK (MISCELLANEOUS) ×3 IMPLANT
TUBING CIL FLEX 10 FLL-RA (TUBING) ×3 IMPLANT
WIRE EMERALD 3MM-J .035X150CM (WIRE) ×3 IMPLANT
WIRE EMERALD 3MM-J .035X260CM (WIRE) ×3 IMPLANT

## 2017-02-11 NOTE — H&P (Signed)
History & Physical    Patient ID: Ernest Booker MRN: [id number], DOB/AGE: 1946/03/08   Admit date: 02/11/2017   Primary Physician: Myer Peer, MD Primary Cardiologist: Agustin Cree  Patient Profile    71 yo male with PMH of CAD s/p 4v CABG in 6/18, HTN, HL, post-op Afib, and arthritis who presented to [facility] with exertional chest pain.   Past Medical History   Past Medical History:  Diagnosis Date  . Anginal pain ([place])   . Arthritis   . Coronary artery disease   . Hyperlipidemia   . Hypertension   . Myocardial infarction [facility])     Past Surgical History:  Procedure Laterality Date  . CORONARY ARTERY BYPASS GRAFT N/A 09/28/2016   Procedure: CORONARY ARTERY BYPASS GRAFTING (CABG) x four, using left internal mammary artery and right leg greater saphenous vein harvested endoscopically;  Surgeon: Ivin Poot, MD;  Location: [place];  Service: Open Heart Surgery;  Laterality: N/A;  . IR THORACENTESIS ASP PLEURAL SPACE W/IMG GUIDE  10/06/2016  . LEFT HEART CATH AND CORONARY ANGIOGRAPHY N/A 09/24/2016   Procedure: Left Heart Cath and Coronary Angiography;  Surgeon: Lorretta Harp, MD;  Location: [place] CV LAB;  Service: Cardiovascular;  Laterality: N/A;  . TEE WITHOUT CARDIOVERSION N/A 09/28/2016   Procedure: TRANSESOPHAGEAL ECHOCARDIOGRAM (TEE);  Surgeon: Prescott Gum, Collier Salina, MD;  Location: [place];  Service: Open Heart Surgery;  Laterality: N/A;     Allergies  Allergies  Allergen Reactions  . Benadryl [Diphenhydramine Hcl (Sleep)]     UNSPECIFIED REACTION   . Levofloxacin     UNSPECIFIED REACTION     History of Present Illness    Ernest Booker is a 71 yo male with PMH of CAD s/p 4v CABG in 6/18, HTN, HL, post-op Afib, and arthritis. Who presented to [facility] back in June of this year reporting chest pain, was ultimately transferred to [facility] after cardiac markers were consistent with non-ST elevation MI. Coronary angiography demonstrated multivessel CAD with  ostial involvement of the LAD, ramus and circumflex. Ultimately underwent four-vessel CABG with Dr. Prescott Gum. Did develop postoperative atrial fibrillation, was placed on amiodarone and ultimately Coumadin. Was seen back in follow-up and ultimately Coumadin was discontinued, and continued on low-dose aspirin.  Reports he has been in his usual state of health up until several weeks ago when he started to experience exertional chest pain. States these episodes seems to be coming more often, but are typically relieved with rest. He eventually presented to the [facility] ED with symptoms on 02/10/17. Labs on admission showed creatinine 1.2, troponin cycled and negative 3, hemoglobin 15.6, INR 1.0. He was seen in consultation by the cardiology fellow at [facility] who suggested further maximizing medical therapy for his angina with long-acting nitrates and Ranexa. He was given one dose of nitrates, and blood pressure became soft. It was felt there was no further room to further titrate his medications, and the options of cardiac catheterization were discussed with the patient. Cardiology: Was consult and he was transferred for further workup including cardiac catheterization. He was started on IV heparin in nature prior to transfer.   Home Medications    Prior to Admission medications   Medication Sig Start Date End Date Taking? Authorizing Provider  aspirin 81 MG EC tablet Take 1 tablet (81 mg total) by mouth daily. 11/30/16   Park Liter, MD  atorvastatin (LIPITOR) 80 MG tablet Take 1 tablet (80 mg total) by mouth daily. 12/03/16  Park Liter, MD  escitalopram (LEXAPRO) 10 MG tablet Take 1 tablet by mouth daily. 11/14/16   [provider]  lisinopril (PRINIVIL,ZESTRIL) 2.5 MG tablet Take 1 tablet (2.5 mg total) by mouth daily. 12/16/16   Ivin Poot, MD  metoprolol tartrate (LOPRESSOR) 25 MG tablet Take 0.5 tablets (12.5 mg total) by mouth 2 (two) times daily. 12/03/16   Park Liter, MD  pantoprazole (PROTONIX) 40 MG tablet Take 40 mg by mouth 2 (two) times daily. 07/05/16   [provider]  UNABLE TO FIND Med Name: CBD    [provider]  vitamin B-12 (CYANOCOBALAMIN) 1000 MCG tablet Take 1,000 mcg by mouth daily.    [provider]    Family History    Family History  Problem Relation Age of Onset  . Heart failure Mother   . Heart attack Father     Social History    Social History   Social History  . Marital status: Married    Spouse name: N/A  . Number of children: N/A  . Years of education: N/A   Occupational History  . Not on file.   Social History Main Topics  . Smoking status: Never Smoker  . Smokeless tobacco: Never Used  . Alcohol use No  . Drug use: No  . Sexual activity: Yes    Birth control/ protection: None   Other Topics Concern  . Not on file   Social History Narrative  . No narrative on file     Review of Systems    See HPI  All other systems reviewed and are otherwise negative except as noted above.  Physical Exam    Blood pressure 120/70, pulse (!) 51, resp. rate 12, SpO2 95 %.  General: Pleasant, NAD Psych: Normal affect. Neuro: Alert and oriented X 3. Moves all extremities spontaneously. HEENT: Normal  Neck: Supple without bruits or JVD. Lungs:  Resp regular and unlabored, CTA. Heart: RRR no s3, s4, or murmurs. Abdomen: Soft, non-tender, non-distended, BS + x 4.  Extremities: No clubbing, cyanosis or edema. DP/PT/Radials 2+ and equal bilaterally.  Labs    Troponin (Point of Care Test) No results for input(s): TROPIPOC in the last 72 hours. No results for input(s): CKTOTAL, CKMB, TROPONINI in the last 72 hours. Lab Results  Component Value Date   WBC 11.5 (H) 10/07/2016   HGB 10.4 (L) 10/07/2016   HCT 31.9 (L) 10/07/2016   MCV 91.4 10/07/2016   PLT 239 10/07/2016   No results for input(s): NA, K, CL, CO2, BUN, CREATININE, CALCIUM, PROT, BILITOT, ALKPHOS, ALT, AST,  GLUCOSE in the last 168 hours.  Invalid input(s): LABALBU Lab Results  Component Value Date   CHOL 116 09/26/2016   HDL 34 (L) 09/26/2016   LDLCALC 61 09/26/2016   TRIG 103 09/26/2016   No results found for: [facility]   Radiology Studies    No results found.  ECG & Cardiac Imaging    EKG: Sinus bradycardia with left axis deviation, no acute ST/T-wave abnormalities.  Assessment & Plan    71 yo male with PMH of CAD s/p 4v CABG in 6/18, HTN, HL, post-op Afib, and arthritis who presented to [facility] with exertional chest pain.   1. Unstable angina: Reports the symptoms started several weeks prior to presenting for this admission. Mostly exertional, and relieved with rest. States he is not normally take sublingual nitroglycerin at home for these episodes. Thus far his enzymes of a negative 3, admitting her  Oval Linsey attempted to titrate his home medications. Unable to do so given soft blood pressure, and ultimately recommended transfer to call for cardiac catheterization. -- Currently on IV nitroglycerin and chest pain free. Plan for cardiac catheterization today. The patient understands that risks included but are not limited to stroke (1 in 1000), death (1 in 65), kidney failure [usually temporary] (1 in 500), bleeding (1 in 200), allergic reaction [possibly serious] (1 in 200).   2. CAD s/p 4v CABG: 6/18 with Dr. Prescott Gum.   3. HTN: stable  4. HL: on statin therapy  5. PAF: Developed postoperatively, was on amiodarone and Coumadin. Both have since been stopped by Dr. Prescott Gum in follow up. Sinus rhythm on admission.  Barnet Pall, NP-C Pager [phone] 02/11/2017, 11:23 AM    I have examined the patient and reviewed assessment and plan and discussed with patient.  Agree with above as stated.  Cr 1.2.  Plan for cath.  All questions answered.  He has had some mild renal insufficiency in the past. No bleeding problems.  Should be ok for DES.  Left sided  vessels were small as was LIMA.  Need to r/o early graft failure.  Larae Grooms

## 2017-02-11 NOTE — Progress Notes (Signed)
Patient received from [place] by [employer]. Arrived on NTG drip at 74mcg  To rt fa w/NS at 50cc/hr started. Denies discomfort

## 2017-02-11 NOTE — Interval H&P Note (Signed)
Cath Lab Visit (complete for each Cath Lab visit)  Clinical Evaluation Leading to the Procedure:   ACS: Yes.    Non-ACS:    Anginal Classification: CCS IV  Anti-ischemic medical therapy: Minimal Therapy (1 class of medications)  Non-Invasive Test Results: No non-invasive testing performed  Prior CABG: Previous CABG      History and Physical Interval Note:  02/11/2017 12:18 PM  Ernest Booker  has presented today for surgery, with the diagnosis of cp  The various methods of treatment have been discussed with the patient and family. After consideration of risks, benefits and other options for treatment, the patient has consented to  Procedure(s): LEFT HEART CATH AND CORS/GRAFTS ANGIOGRAPHY (N/A) as a surgical intervention .  The patient's history has been reviewed, patient examined, no change in status, stable for surgery.  I have reviewed the patient's chart and labs.  Questions were answered to the patient's satisfaction.     Larae Grooms

## 2017-02-11 NOTE — Progress Notes (Addendum)
Site area: rfa Site Prior to Removal:  Level 0 Pressure Applied For: 25 min Manual:  yes  Patient Status During Pull:  stable Post Pull Site:  Level 0 Post Pull Instructions Given:  yes Post Pull Pulses Present: palpable Dressing Applied:  tegaderm Bedrest begins @ 1430 till 1830 Comments:

## 2017-02-11 NOTE — Progress Notes (Signed)
Family here to visit in holding area, awaiting catheterization

## 2017-02-12 ENCOUNTER — Encounter (HOSPITAL_COMMUNITY): Payer: Self-pay | Admitting: Interventional Cardiology

## 2017-02-12 DIAGNOSIS — E785 Hyperlipidemia, unspecified: Secondary | ICD-10-CM

## 2017-02-12 DIAGNOSIS — I200 Unstable angina: Secondary | ICD-10-CM

## 2017-02-12 DIAGNOSIS — D750 Familial erythrocytosis: Secondary | ICD-10-CM

## 2017-02-12 DIAGNOSIS — I10 Essential (primary) hypertension: Secondary | ICD-10-CM

## 2017-02-12 DIAGNOSIS — I25709 Atherosclerosis of coronary artery bypass graft(s), unspecified, with unspecified angina pectoris: Secondary | ICD-10-CM

## 2017-02-12 MED ORDER — ISOSORBIDE MONONITRATE ER 30 MG PO TB24: 30.0000 mg | ORAL_TABLET | Freq: Every day | ORAL | 6 refills | Status: DC

## 2017-02-12 NOTE — Progress Notes (Signed)
Progress Note  Patient Name: Ernest Booker Date of Encounter: 02/12/2017  Primary Cardiologist: Dr. Agustin Cree  Subjective   No further chest pain or shortness of breath.   Inpatient Medications    Scheduled Meds: . aspirin EC  81 mg Oral Daily  . atorvastatin  80 mg Oral Daily  . escitalopram  10 mg Oral Daily  . isosorbide mononitrate  30 mg Oral Daily  . lisinopril  2.5 mg Oral Daily  . metoprolol tartrate  12.5 mg Oral BID  . pantoprazole  40 mg Oral BID  . sodium chloride flush  3 mL Intravenous Q12H  . vitamin B-12  1,000 mcg Oral Daily   Continuous Infusions: . sodium chloride     PRN Meds: sodium chloride, acetaminophen, ondansetron (ZOFRAN) IV, sodium chloride flush   Vital Signs    Vitals:   02/12/17 1100 02/12/17 1101 02/12/17 1102 02/12/17 1105  BP: (!) 95/45 99/63 (!) 95/59 95/70  Pulse: 69 65 75   Resp:      Temp:      TempSrc:      SpO2:      Weight:      Height:        Intake/Output Summary (Last 24 hours) at 02/12/17 1337 Last data filed at 02/12/17 1309  Gross per 24 hour  Intake              240 ml  Output                0 ml  Net              240 ml   Filed Weights   02/11/17 2013 02/12/17 0520  Weight: 194 lb 3.6 oz (88.1 kg) 193 lb 6.4 oz (87.7 kg)    Telemetry    SR  - Personally Reviewed  ECG    N/A - Personally Reviewed  Physical Exam   GEN: No acute distress.   Neck: No JVD Cardiac: RRR, no murmurs, rubs, or gallops. R groin without hematoma Respiratory: Clear to auscultation bilaterally. GI: Soft, nontender, non-distended  MS: No edema; No deformity. Neuro:  Nonfocal  Psych: Normal affect   Labs    ChemistryNo results for input(s): NA, K, CL, CO2, GLUCOSE, BUN, CREATININE, CALCIUM, PROT, ALBUMIN, AST, ALT, ALKPHOS, BILITOT, GFRNONAA, GFRAA, ANIONGAP in the last 168 hours.   HematologyNo results for input(s): WBC, RBC, HGB, HCT, MCV, MCH, MCHC, RDW, PLT in the last 168 hours.  Cardiac EnzymesNo results for  input(s): TROPONINI in the last 168 hours. No results for input(s): TROPIPOC in the last 168 hours.   BNPNo results for input(s): BNP, PROBNP in the last 168 hours.   DDimer No results for input(s): DDIMER in the last 168 hours.   Radiology    No results found.  Cardiac Studies   LEFT HEART CATH AND CORS/GRAFTS ANGIOGRAPHY  Conclusion     RPDA lesion, 90 %stenosed.  Ost Ramus lesion, 95 %stenosed.  1st Diag lesion, 95 %stenosed.  Ost LAD lesion, 70 %stenosed.  Ost RPDA to RPDA lesion, 95 %stenosed.  SVG.  Origin lesion, 100 %stenosed.  Ramus lesion, 80 %stenosed.  Lat Ramus lesion, 100 %stenosed.  Origin lesion, 100 %stenosed.  2nd Mrg lesion, 100 %stenosed.  Dist LAD lesion, 100 %stenosed.  The left ventricular systolic function is normal.  LV end diastolic pressure is normal.  The left ventricular ejection fraction is 55-65% by visual estimate.  There is no aortic valve stenosis.   Severe  three vessel CAD involving ostial and distal LAD, ostial and mid ramus, occluded OM, and severe diffuse PDA disease.  RCA is large dominant vessel and PLA has only moderate disease, supplying lateral wall.   3/4 grafts occluded.  Normal LV function.    Consider CTO PCI of the LAD if he fails medical therapy.    Start Imdur.    Diagnostic Diagram         Patient Profile       71 yo male with PMH of CAD s/p 4v CABG in 6/18, HTN, HL, post-op Afib, and arthritis who presented to [facility] with exertional chest pain.   Labs on admission showed creatinine 1.2, troponin cycled and negative 3, hemoglobin 15.6, INR 1.0. He was seen in consultation by the cardiology fellow at [facility] who suggested further maximizing medical therapy for his angina with long-acting nitrates and Ranexa. He was given one dose of nitrates, and blood pressure became soft. It was felt there was no further room to further titrate his medications, and the options of cardiac  catheterization were discussed with the patient.   Assessment & Plan    1.Unstable angina - Cath showed 3/4 occluded graft. RCA is large dominant vessel and PLA has only moderate disease, supplying lateral wall.  - Imdur started for medication therapy.  Consider CTO PCI of the LAD if he fails medical therapy.   - Continue ASA 81mg  qd, Lipitor 80mg  qd, imdur, BB and ACE. No recurrent chest pain  2. HTN - BP soft. Held BB and ACE. May consider fluids  3. HLD - 09/26/2016: Cholesterol 116; HDL 34; LDL Cholesterol 61; Triglycerides 103; VLDL 21  - Continue statin  For questions or updates, please contact [facility] Please consult [email or web address] for contact info under Cardiology/STEMI.      Signed, Leanor Kail, PA  02/12/2017, 1:37 PM    I have seen, examined and evaluated the patient this PM along with Mr. Curly [place], [place].  After reviewing all the available data and chart, we discussed the patients laboratory, study & physical findings as well as symptoms in detail. I agree with his findings, examination as well as impression recommendations as per our discussion.    I have personally seen and evaluated the patient and reviewed his cath films.  I discussed the films and findings with Dr. Irish Lack.  Basically majority of his grafts are occluded, and he would likely require difficult PCI procedures.  The plan for now is to try to optimize medical management.  He actually angulated in the hallway well with Imdur 30 mm daily.  I think he would probably also benefit from Ranexa (however his wife is about ready to complete her insurance and they are about ready to go on to Medicare.  I would prefer to see if this is unable would be covered in Medicare).  As he is not actively having chest pain I think he is probably stable for discharge to follow-up with Dr. Agustin Cree.   Dr. Quitman Livings will tentatively consider placing him on the schedule for the next CTO day which is November 14 if Dr.  Agustin Cree feels that the angina will be difficult to manage.   Would consider CTO PCI of LAD & possibly the rPDA.  Otherwise, we will continue his home medications.  I think we will have him take his Imdur in the evening along with aspirin.  He is on a stable dose of beta-blocker and ACE inhibitor along with high-dose statin.     Shanon Brow  Ellyn Hack, M.D., M.S. Interventional Cardiologist   Pager # [phone] Phone # [phone] [address] [place], [place] [address]

## 2017-02-12 NOTE — Discharge Summary (Signed)
Discharge Summary    Patient ID: Ernest Booker,  MRN: [id number], DOB/AGE: 1946/01/10 71 y.o.  Admit date: 02/11/2017 Discharge date: 02/12/2017  Primary Care Provider: Myer Peer Primary Cardiologist: Dr. Agustin Cree  Discharge Diagnoses    Active Problems:   Unstable angina ([name])  HTN   HLD  Allergies Allergies  Allergen Reactions  . Benadryl [Diphenhydramine Hcl (Sleep)]     UNSPECIFIED REACTION   . Levofloxacin Other (See Comments)    Tendon rupture     Diagnostic Studies/Procedures    LEFT HEART CATH AND CORS/GRAFTS ANGIOGRAPHY  Conclusion     RPDA lesion, 90 %stenosed.  Ost Ramus lesion, 95 %stenosed.  1st Diag lesion, 95 %stenosed.  Ost LAD lesion, 70 %stenosed.  Ost RPDA to RPDA lesion, 95 %stenosed.  SVG.  Origin lesion, 100 %stenosed.  Ramus lesion, 80 %stenosed.  Lat Ramus lesion, 100 %stenosed.  Origin lesion, 100 %stenosed.  2nd Mrg lesion, 100 %stenosed.  Dist LAD lesion, 100 %stenosed.  The left ventricular systolic function is normal.  LV end diastolic pressure is normal.  The left ventricular ejection fraction is 55-65% by visual estimate.  There is no aortic valve stenosis.  Severe three vessel CAD involving ostial and distal LAD, ostial and mid ramus, occluded OM, and severe diffuse PDA disease.  RCA is large dominant vessel and PLA has only moderate disease, supplying lateral wall.   3/4 grafts occluded. Normal LV function.   Consider CTO PCI of the LAD if he fails medical therapy.   Start Imdur.    Diagnostic Diagram           History of Present Illness     71 yo male with PMH of CAD s/p 4v CABG in 6/18, HTN, HL, post-op Afib, and arthritis who presented to [facility] with exertional chest pain.  Labs on admission showed creatinine 1.2, troponin cycled and negative 3, hemoglobin 15.6, INR 1.0. He was seen in consultation by the cardiology fellow at [facility] who suggested further  maximizing medical therapy for his angina with long-acting nitrates and Ranexa. He was given one dose of nitrates, and blood pressure became soft. It was felt there was no further room to further titrate his medications, and the options of cardiac catheterization were discussed with the patient  Hospital Course     Consultants: None  1.Unstable angina - Cath showed 3/4 occluded graft. RCA is large dominant vessel and PLA has only moderate disease, supplying lateral wall.  - The plan for now is to try to optimize medical management. Imdur started.  He actually angulated in the hallway well with Imdur 30 mm daily.  I think he would probably also benefit from Ranexa (however his wife is about ready to complete her insurance and they are about ready to go on to Medicare.  I would prefer to see if this is unable would be covered in Medicare). -  Consider CTO PCI of the LAD& possibly the rPDA.if he fails medical therapy. Per Dr. Ellyn Hack "tentatively consider placing him on the schedule for the next CTO day which is November 14 if Dr. Agustin Cree feels that the angina will be difficult to manage".  - Continue ASA 81mg  qd, Lipitor 80mg  qd, imdur, BB and ACE. No recurrent chest pain  2. HTN - BP soft. Held BB and ACE. Resume at discharge. Advised to hold for SBP less than 100.  3. HLD - 09/26/2016: Cholesterol 116; HDL 34; LDL Cholesterol 61; Triglycerides 103; VLDL 21  -  Continue statin  The patient has been seen by Dr. Ellyn Hack  today and deemed ready for discharge home. All follow-up appointments have been scheduled. Discharge medications are listed below.  _____________   Discharge Vitals Blood pressure 95/70, pulse 75, temperature 98.1 F (36.7 C), resp. rate 18, height 5\' 6"  (1.676 m), weight 193 lb 6.4 oz (87.7 kg), SpO2 95 %.  Filed Weights   02/11/17 2013 02/12/17 0520  Weight: 194 lb 3.6 oz (88.1 kg) 193 lb 6.4 oz (87.7 kg)    Labs & Radiologic Studies     CBC No results for  input(s): WBC, NEUTROABS, HGB, HCT, MCV, PLT in the last 72 hours. Basic Metabolic Panel No results for input(s): NA, K, CL, CO2, GLUCOSE, BUN, CREATININE, CALCIUM, MG, PHOS in the last 72 hours. Liver Function Tests No results for input(s): AST, ALT, ALKPHOS, BILITOT, PROT, ALBUMIN in the last 72 hours. No results for input(s): LIPASE, AMYLASE in the last 72 hours. Cardiac Enzymes No results for input(s): CKTOTAL, CKMB, CKMBINDEX, TROPONINI in the last 72 hours. BNP Invalid input(s): POCBNP D-Dimer No results for input(s): DDIMER in the last 72 hours. Hemoglobin A1C No results for input(s): HGBA1C in the last 72 hours. Fasting Lipid Panel No results for input(s): CHOL, HDL, LDLCALC, TRIG, CHOLHDL, LDLDIRECT in the last 72 hours. Thyroid Function Tests No results for input(s): TSH, T4TOTAL, T3FREE, THYROIDAB in the last 72 hours.  Invalid input(s): FREET3  No results found.  Disposition   Pt is being discharged home today in good condition.  Follow-up Plans & Appointments    Follow-up Information    [facility]. Go on 01/25/2018.   Specialty:  Cardiology Why:  @11 :20am for post hosptial  Contact information: [address] [place] [phone]         Discharge Instructions    Diet - low sodium heart healthy    Complete by:  As directed    Discharge instructions    Complete by:  As directed    No driving for 48 hours. No lifting over 5 lbs for 1 week. No sexual activity for 1 week.Keep procedure site clean & dry. If you notice increased pain, swelling, bleeding or pus, call/return!  You may shower, but no soaking baths/hot tubs/pools for 1 week.   Hold Metoprolol and lisinopril if Blood pressure below 100/50.   Increase activity slowly    Complete by:  As directed       Discharge Medications   Current Discharge Medication List    START taking these medications   Details  isosorbide mononitrate  (IMDUR) 30 MG 24 hr tablet Take 1 tablet (30 mg total) by mouth daily. Qty: 30 tablet, Refills: 6      CONTINUE these medications which have NOT CHANGED   Details  acetaminophen (TYLENOL) 500 MG tablet Take 500-1,000 mg by mouth every 6 (six) hours as needed for headache.    aspirin 81 MG EC tablet Take 1 tablet (81 mg total) by mouth daily. Qty: 30 tablet, Refills: 11    atorvastatin (LIPITOR) 80 MG tablet Take 1 tablet (80 mg total) by mouth daily. Qty: 90 tablet, Refills: 3    lisinopril (PRINIVIL,ZESTRIL) 2.5 MG tablet Take 1 tablet (2.5 mg total) by mouth daily. Qty: 90 tablet, Refills: 3    metoprolol tartrate (LOPRESSOR) 25 MG tablet Take 0.5 tablets (12.5 mg total) by mouth 2 (two) times daily. Qty: 180 tablet, Refills: 3    pantoprazole (  PROTONIX) 40 MG tablet Take 40 mg by mouth 2 (two) times daily. Refills: 3    vitamin B-12 (CYANOCOBALAMIN) 1000 MCG tablet Take 1,000 mcg by mouth daily.      STOP taking these medications     escitalopram (LEXAPRO) 10 MG tablet             Outstanding Labs/Studies   None  Duration of Discharge Encounter   Greater than 30 minutes including physician time.  Signed, Ashanti Ratti PA-C 02/12/2017, 3:18 PM

## 2017-02-23 ENCOUNTER — Encounter: Payer: Self-pay | Admitting: Cardiology

## 2017-02-23 ENCOUNTER — Ambulatory Visit (INDEPENDENT_AMBULATORY_CARE_PROVIDER_SITE_OTHER): Payer: BLUE CROSS/BLUE SHIELD | Admitting: Cardiology

## 2017-02-23 VITALS — BP 146/74 | HR 60 | Resp 18 | Ht 65.0 in | Wt 199.0 lb

## 2017-02-23 DIAGNOSIS — I10 Essential (primary) hypertension: Secondary | ICD-10-CM

## 2017-02-23 DIAGNOSIS — I2510 Atherosclerotic heart disease of native coronary artery without angina pectoris: Secondary | ICD-10-CM

## 2017-02-23 MED ORDER — RANOLAZINE ER 500 MG PO TB12: 500.0000 mg | ORAL_TABLET | Freq: Two times a day (BID) | ORAL | 0 refills | Status: DC

## 2017-02-23 MED ORDER — ISOSORBIDE MONONITRATE ER 60 MG PO TB24: 60.0000 mg | ORAL_TABLET | Freq: Every day | ORAL | 11 refills | Status: DC

## 2017-02-23 MED ORDER — NITROGLYCERIN 0.4 MG SL SUBL: 0.4000 mg | SUBLINGUAL_TABLET | SUBLINGUAL | 3 refills | Status: DC | PRN

## 2017-02-23 NOTE — Progress Notes (Signed)
Cardiology Office Note:    Date:  02/23/2017   ID:  Ernest Booker, DOB March 24, 1946, MRN [id number]  PCP:  Myer Peer, MD  Cardiologist:  Jenne Campus, MD    Referring MD: Myer Peer, MD   Chief Complaint  Patient presents with  . Follow-up on Cath  Not doing well  History of Present Illness:    Ernest Booker is a 71 y.o. male coronary artery disease.  In the summer he had coronary artery bypass graft however he started experiencing exertional chest pain eventually end up going to the hospital cardiac catheterization was done he was fine to have 3 out of 4 grafts completely occluded.  He was put on medical therapy and discharged home with intention to hopefully be able to manage this problem with medications if not he may require it put another intervention with potentially opening completely occluded artery.  He is having a lot of angina dermatophyte even walking from car to our office brought another episode of angina.  Not have nitroglycerin.  Spent a great of time talking about the situation which is quite complicated and frustrating.  I will give another trial of maximization of his medical therapy I will put him on ranolazine today I gave him samples of from thousand 500 twice daily I will double the dose of Imdur.  He took him the wrong way he was taking Imdur at evening time likely to be moved to the morning so he will end up with ranolazine and 500 twice daily Imdur 60 mg once in the morning metoprolol 12.5 twice daily.  I will call him day after tomorrow to see how he is doing he does have nitroglycerin instruction how to take it including going to the emergency room if pain does not go away with nitroglycerin.  I will see him back in my office on Monday.  We will try to quickly maximize medical therapy hoping that that will suppress his symptoms.  Past Medical History:  Diagnosis Date  . Anginal pain ([place])   . Arthritis   . Coronary artery disease   . Hyperlipidemia   .  Hypertension   . Myocardial infarction [facility])     Past Surgical History:  Procedure Laterality Date  . IR THORACENTESIS ASP PLEURAL SPACE W/IMG GUIDE  10/06/2016    Current Medications: Current Meds  Medication Sig  . acetaminophen (TYLENOL) 500 MG tablet Take 500-1,000 mg by mouth every 6 (six) hours as needed for headache.  Marland Kitchen aspirin 81 MG EC tablet Take 1 tablet (81 mg total) by mouth daily.  Marland Kitchen atorvastatin (LIPITOR) 80 MG tablet Take 1 tablet (80 mg total) by mouth daily.  . isosorbide mononitrate (IMDUR) 30 MG 24 hr tablet Take 1 tablet (30 mg total) by mouth daily.  Marland Kitchen lisinopril (PRINIVIL,ZESTRIL) 2.5 MG tablet Take 1 tablet (2.5 mg total) by mouth daily.  . metoprolol tartrate (LOPRESSOR) 25 MG tablet Take 0.5 tablets (12.5 mg total) by mouth 2 (two) times daily.  . pantoprazole (PROTONIX) 40 MG tablet Take 40 mg by mouth 2 (two) times daily.  . vitamin B-12 (CYANOCOBALAMIN) 1000 MCG tablet Take 1,000 mcg by mouth daily.     Allergies:   Benadryl [diphenhydramine hcl (sleep)] and Levofloxacin   Social History   Socioeconomic History  . Marital status: Married    Spouse name: None  . Number of children: None  . Years of education: None  . Highest education level: None  Social Needs  . Emergency planning/management officer  strain: None  . Food insecurity - worry: None  . Food insecurity - inability: None  . Transportation needs - medical: None  . Transportation needs - non-medical: None  Occupational History  . Occupation: Retired  Tobacco Use  . Smoking status: Never Smoker  . Smokeless tobacco: Never Used  Substance and Sexual Activity  . Alcohol use: No  . Drug use: No  . Sexual activity: Yes    Birth control/protection: None  Other Topics Concern  . None  Social History Narrative  . None     Family History: The patient's family history includes Heart attack in his father; Heart failure in his mother. ROS:   Please see the history of present illness.    All 14 point  review of systems negative except as described per history of present illness  EKGs/Labs/Other Studies Reviewed:      Recent Labs: 09/26/2016: TSH 2.023 09/29/2016: Magnesium 2.2 10/03/2016: ALT 23 10/07/2016: Hemoglobin 10.4; Platelets 239 11/30/2016: BUN 22; Creatinine, Ser 1.36; Potassium 4.3; Sodium 139  Recent Lipid Panel    Component Value Date/Time   CHOL 116 09/26/2016 0232   TRIG 103 09/26/2016 0232   HDL 34 (L) 09/26/2016 0232   CHOLHDL 3.4 09/26/2016 0232   VLDL 21 09/26/2016 0232   LDLCALC 61 09/26/2016 0232    Physical Exam:    VS:  BP (!) 146/74   Pulse 60   Resp 18   Ht 5\' 5"  (1.651 m)   Wt 199 lb (90.3 kg)   BMI 33.12 kg/m     Wt Readings from Last 3 Encounters:  02/23/17 199 lb (90.3 kg)  02/12/17 193 lb 6.4 oz (87.7 kg)  12/16/16 198 lb 3.2 oz (89.9 kg)     GEN:  Well nourished, well developed in no acute distress HEENT: Normal NECK: No JVD; No carotid bruits LYMPHATICS: No lymphadenopathy CARDIAC: RRR, no murmurs, no rubs, no gallops RESPIRATORY:  Clear to auscultation without rales, wheezing or rhonchi  ABDOMEN: Soft, non-tender, non-distended MUSCULOSKELETAL:  No edema; No deformity  SKIN: Warm and dry LOWER EXTREMITIES: no swelling NEUROLOGIC:  Alert and oriented x 3 PSYCHIATRIC:  Normal affect   ASSESSMENT:    1. Essential hypertension   2. Coronary artery disease involving native coronary artery of native heart without angina pectoris    PLAN:    In order of problems listed above:  1. Coronary artery disease status post coronary artery bypass graft with failed 3 grafts after 4 he is asymptomatic we will try to manage this problem with medications.  If not he may require intervention of totally occluded artery. 2. Essential hypertension: Blood pressure appears to be controlled continue present management 3. This lipidemia: Continue with statin.  Complicated situation and frustrating scenario hopefully will be able to manage this with  medications if not he may require intervention on his completely occluded artery.  The hospital review including his cardiac catheterization   Medication Adjustments/Labs and Tests Ordered: Current medicines are reviewed at length with the patient today.  Concerns regarding medicines are outlined above.  No orders of the defined types were placed in this encounter.  Medication changes: No orders of the defined types were placed in this encounter.   Signed, Park Liter, MD, [employer] 02/23/2017 3:36 PM    [facility]

## 2017-02-23 NOTE — Patient Instructions (Signed)
Medication Instructions:  Your physician has recommended you make the following change in your medication:  1.) INCREASE Imdur to 60 mg daily. (This has been sent to your pharmacy.) 2.) START Ranexa 500 mg twice daily. (We gave you samples of this in office today.)  3.) START Nitroglycerin 0.4 mg as needed for chest pain. The proper use and anticipated side effects of nitroglycerine has been carefully explained.  If a single episode of chest pain is not relieved by one tablet, the patient will try another within 5 minutes; and if this doesn't relieve the pain, the patient is instructed to call 911 for transportation to an emergency department.  Labwork: None   Testing/Procedures: None   Follow-Up: Your physician recommends that you schedule a follow-up appointment in: Monday   Any Other Special Instructions Will Be Listed Below (If Applicable).  Please note that any paperwork needing to be filled out by the provider will need to be addressed at the front desk prior to seeing the provider. Please note that any paperwork FMLA, Disability or other documents regarding health condition is subject to a $25.00 charge that must be received prior to completion of paperwork in the form of a money order or check.    If you need a refill on your cardiac medications before your next appointment, please call your pharmacy.

## 2017-02-25 ENCOUNTER — Telehealth: Payer: Self-pay

## 2017-02-25 ENCOUNTER — Ambulatory Visit: Payer: BLUE CROSS/BLUE SHIELD | Admitting: Cardiology

## 2017-02-25 NOTE — Telephone Encounter (Signed)
S/w wife and she states that the pt has had improvement but in the evening around 5 he starts having the problem. She states that today she picked up the nitro tablets. She states the pt has actually reported feeling really well the past 2 days. I have encouraged her to call our office should she have concerns between now and Monday.

## 2017-02-25 NOTE — Telephone Encounter (Signed)
VO from [place] to increase Ranexa to 1000 mg BID. I have discussed this with pts wife. She verbalized understanding.

## 2017-03-01 ENCOUNTER — Ambulatory Visit (INDEPENDENT_AMBULATORY_CARE_PROVIDER_SITE_OTHER): Payer: BLUE CROSS/BLUE SHIELD | Admitting: Cardiology

## 2017-03-01 ENCOUNTER — Encounter: Payer: Self-pay | Admitting: Cardiology

## 2017-03-01 VITALS — BP 120/60 | HR 68 | Resp 14 | Ht 65.0 in | Wt 201.4 lb

## 2017-03-01 DIAGNOSIS — Z951 Presence of aortocoronary bypass graft: Secondary | ICD-10-CM

## 2017-03-01 DIAGNOSIS — I2510 Atherosclerotic heart disease of native coronary artery without angina pectoris: Secondary | ICD-10-CM

## 2017-03-01 DIAGNOSIS — E782 Mixed hyperlipidemia: Secondary | ICD-10-CM

## 2017-03-01 DIAGNOSIS — I255 Ischemic cardiomyopathy: Secondary | ICD-10-CM | POA: Diagnosis not present

## 2017-03-01 MED ORDER — METOPROLOL TARTRATE 25 MG PO TABS: 25.0000 mg | ORAL_TABLET | Freq: Two times a day (BID) | ORAL | 3 refills | Status: DC

## 2017-03-01 MED ORDER — RANOLAZINE ER 500 MG PO TB12: 1000.0000 mg | ORAL_TABLET | Freq: Two times a day (BID) | ORAL | 6 refills | Status: DC

## 2017-03-01 MED ORDER — RANOLAZINE ER 1000 MG PO TB12: 1000.0000 mg | ORAL_TABLET | Freq: Two times a day (BID) | ORAL | 6 refills | Status: DC

## 2017-03-01 NOTE — Progress Notes (Signed)
Cardiology Office Note:    Date:  03/01/2017   ID:  Ernest Booker, DOB 10/22/1945, MRN [id number]  PCP:  Myer Peer, MD  Cardiologist:  Jenne Campus, MD    Referring MD: Myer Peer, MD   Chief Complaint  Patient presents with  . Discuss Medication Changes  Doing better   History of Present Illness:    Ernest Booker is a 71 y.o. male  With CAD, recent CABG and now occluded 3/4 grafts. Trying to max medical therapy, so far with significant improvement.  He was able to work some in the garden eventually developed chest pain but he was able to walk from parking lot to my office without chest pain this time last time he did have a chest pain.  Still some room for improvement in therapy before proceeding to potentially doing repeated cardiac catheterization.  The EKG today to see if there is any room for improvement in dose of metoprolol.  I told him if he still having more pain he need to let me know if pain does not go away with nitroglycerin he needs to go to the emergency room  Past Medical History:  Diagnosis Date  . Anginal pain ([place])   . Arthritis   . Coronary artery disease   . Hyperlipidemia   . Hypertension   . Myocardial infarction [facility])     Past Surgical History:  Procedure Laterality Date  . IR THORACENTESIS ASP PLEURAL SPACE W/IMG GUIDE  10/06/2016    Current Medications: Current Meds  Medication Sig  . acetaminophen (TYLENOL) 500 MG tablet Take 500-1,000 mg by mouth every 6 (six) hours as needed for headache.  Marland Kitchen aspirin 81 MG EC tablet Take 1 tablet (81 mg total) by mouth daily.  Marland Kitchen atorvastatin (LIPITOR) 80 MG tablet Take 1 tablet (80 mg total) by mouth daily.  . isosorbide mononitrate (IMDUR) 60 MG 24 hr tablet Take 1 tablet (60 mg total) daily by mouth. (Patient taking differently: Take 120 mg daily by mouth. )  . lisinopril (PRINIVIL,ZESTRIL) 2.5 MG tablet Take 1 tablet (2.5 mg total) by mouth daily.  . metoprolol tartrate (LOPRESSOR) 25 MG tablet Take  0.5 tablets (12.5 mg total) by mouth 2 (two) times daily.  . nitroGLYCERIN (NITROSTAT) 0.4 MG SL tablet Place 1 tablet (0.4 mg total) every 5 (five) minutes as needed under the tongue for chest pain.  . pantoprazole (PROTONIX) 40 MG tablet Take 40 mg by mouth 2 (two) times daily.  . ranolazine (RANEXA) 500 MG 12 hr tablet Take 1 tablet (500 mg total) 2 (two) times daily by mouth. (Patient taking differently: Take 1,000 mg 2 (two) times daily by mouth. )  . vitamin B-12 (CYANOCOBALAMIN) 1000 MCG tablet Take 1,000 mcg by mouth daily.     Allergies:   Benadryl [diphenhydramine hcl (sleep)] and Levofloxacin   Social History   Socioeconomic History  . Marital status: Married    Spouse name: None  . Number of children: None  . Years of education: None  . Highest education level: None  Social Needs  . Financial resource strain: None  . Food insecurity - worry: None  . Food insecurity - inability: None  . Transportation needs - medical: None  . Transportation needs - non-medical: None  Occupational History  . Occupation: Retired  Tobacco Use  . Smoking status: Never Smoker  . Smokeless tobacco: Never Used  Substance and Sexual Activity  . Alcohol use: No  . Drug use: No  . Sexual  activity: Yes    Birth control/protection: None  Other Topics Concern  . None  Social History Narrative  . None     Family History: The patient's family history includes Heart attack in his father; Heart failure in his mother. ROS:   Please see the history of present illness.    All 14 point review of systems negative except as described per history of present illness  EKGs/Labs/Other Studies Reviewed:      Recent Labs: 09/26/2016: TSH 2.023 09/29/2016: Magnesium 2.2 10/03/2016: ALT 23 10/07/2016: Hemoglobin 10.4; Platelets 239 11/30/2016: BUN 22; Creatinine, Ser 1.36; Potassium 4.3; Sodium 139  Recent Lipid Panel    Component Value Date/Time   CHOL 116 09/26/2016 0232   TRIG 103 09/26/2016 0232     HDL 34 (L) 09/26/2016 0232   CHOLHDL 3.4 09/26/2016 0232   VLDL 21 09/26/2016 0232   LDLCALC 61 09/26/2016 0232    Physical Exam:    VS:  BP 120/60   Pulse 68   Resp 14   Ht 5\' 5"  (1.651 m)   Wt 201 lb 6.4 oz (91.4 kg)   BMI 33.51 kg/m     Wt Readings from Last 3 Encounters:  03/01/17 201 lb 6.4 oz (91.4 kg)  02/23/17 199 lb (90.3 kg)  02/12/17 193 lb 6.4 oz (87.7 kg)     GEN:  Well nourished, well developed in no acute distress HEENT: Normal NECK: No JVD; No carotid bruits LYMPHATICS: No lymphadenopathy CARDIAC: RRR, no murmurs, no rubs, no gallops RESPIRATORY:  Clear to auscultation without rales, wheezing or rhonchi  ABDOMEN: Soft, non-tender, non-distended MUSCULOSKELETAL:  No edema; No deformity  SKIN: Warm and dry LOWER EXTREMITIES: no swelling NEUROLOGIC:  Alert and oriented x 3 PSYCHIATRIC:  Normal affect   ASSESSMENT:    1. Coronary artery disease involving native coronary artery of native heart without angina pectoris   2. Ischemic cardiomyopathy   3. S/P CABG x 4   4. Mixed hyperlipidemia    PLAN:    In order of problems listed above:  1. Coronary artery disease with stable angina pectoris: Improving with medications: We will try to increase dose of metoprolol  I will also put him on Xarelto 2.5 mg daily 2. Ischemic cardia myopathy: Stable continue present management 3. Coronary artery bypass graft done in the summer sadly 3 out of 4 grafts occluded 4. Mixed dyslipidemia: We will continue with Lipitor 80 5. History of proximal atrial fibrillation.  After surgery he was on amiodarone amiodarone is continued since that time no palpitations thinking is that his proximal mitral fibrillation was elated to surgery.   Medication Adjustments/Labs and Tests Ordered: Current medicines are reviewed at length with the patient today.  Concerns regarding medicines are outlined above.  No orders of the defined types were placed in this encounter.  Medication  changes: No orders of the defined types were placed in this encounter.   Signed, Park Liter, MD, [facility] 03/01/2017 2:44 PM    [place]

## 2017-03-01 NOTE — Patient Instructions (Addendum)
Medication Instructions:  Your physician has recommended you make the following change in your medication:  1.) INCREASE Metoprolol to 25 mg twice daily.  2.) CONTINUE taking Ranexa 1,000 mg twice daily. Included in today's visit are samples and a discount card to use at the pharmacy when you go to pick up your prescription.   Labwork: None  Testing/Procedures: None   Follow-Up: Your physician recommends that you schedule a follow-up appointment in: 1 week  Any Other Special Instructions Will Be Listed Below (If Applicable).  Please note that any paperwork needing to be filled out by the provider will need to be addressed at the front desk prior to seeing the provider. Please note that any paperwork FMLA, Disability or other documents regarding health condition is subject to a $25.00 charge that must be received prior to completion of paperwork in the form of a money order or check.    If you need a refill on your cardiac medications before your next appointment, please call your pharmacy.

## 2017-03-05 ENCOUNTER — Telehealth: Payer: Self-pay

## 2017-03-05 NOTE — Telephone Encounter (Signed)
Called to check on pt at the request of Dr. Agustin Cree. Wife states today is not such a good day for the pt and that he has taken 1 nitro today, feels weak and his arm was hurting him a little. She states pt is asleep at this time. Dr. Agustin Cree was made aware of how the pt was feeling and encouraged to keep f/u with our office on Monday. Advised to f/u with ED over the weekend with worsening s/s.

## 2017-03-08 ENCOUNTER — Ambulatory Visit (INDEPENDENT_AMBULATORY_CARE_PROVIDER_SITE_OTHER): Payer: BLUE CROSS/BLUE SHIELD | Admitting: Cardiology

## 2017-03-08 ENCOUNTER — Encounter: Payer: Self-pay | Admitting: Cardiology

## 2017-03-08 VITALS — BP 124/60 | HR 54 | Resp 16 | Ht 65.0 in | Wt 198.8 lb

## 2017-03-08 DIAGNOSIS — Z951 Presence of aortocoronary bypass graft: Secondary | ICD-10-CM

## 2017-03-08 DIAGNOSIS — E782 Mixed hyperlipidemia: Secondary | ICD-10-CM

## 2017-03-08 DIAGNOSIS — I25118 Atherosclerotic heart disease of native coronary artery with other forms of angina pectoris: Secondary | ICD-10-CM | POA: Diagnosis not present

## 2017-03-08 MED ORDER — RIVAROXABAN 2.5 MG PO TABS: 2.5000 mg | ORAL_TABLET | Freq: Every day | ORAL | 0 refills | Status: DC

## 2017-03-08 MED ORDER — ISOSORBIDE MONONITRATE ER 120 MG PO TB24: 120.0000 mg | ORAL_TABLET | Freq: Every day | ORAL | 12 refills | Status: DC

## 2017-03-08 NOTE — Progress Notes (Signed)
Cardiology Office Note:    Date:  03/08/2017   ID:  Ernest Booker, DOB April 09, 1946, MRN [id number]  PCP:  Myer Peer, MD  Cardiologist:  Jenne Campus, MD    Referring MD: Myer Peer, MD   Chief Complaint  Patient presents with  . 1 week follow up on medication  Doing better  History of Present Illness:    Ernest Booker is a 71 y.o. male with coronary artery disease, status post coronary artery bypass grafting December however couple weeks ago he was fine to have completely occluded 3 venous grafts out of 4 grafts that he half.  He experienced quite a frequent episode of angina pectoris.  Likely with medication I am able to control it to some degree he tells me that he feels 75% better.  He is already on ranolazine, beta blocker in form of metoprolol but only small dose because of bradycardia, a long-acting nitroglycerin.  Today I will increase the dose of nitroglycerin long-acting will go to him to 120 mg daily.  I will continue rest of his medication I will at 2.5 mg of Xarelto to his medical regimen based on recent research that should be beneficial to him.  Past Medical History:  Diagnosis Date  . Anginal pain ([place])   . Arthritis   . Coronary artery disease   . Hyperlipidemia   . Hypertension   . Myocardial infarction [facility])     Past Surgical History:  Procedure Laterality Date  . CORONARY ARTERY BYPASS GRAFTING (CABG) x four, using left internal mammary artery and right leg greater saphenous vein harvested endoscopically N/A 09/28/2016   Performed by Ivin Poot, MD at [place]  . IR THORACENTESIS ASP PLEURAL SPACE W/IMG GUIDE  10/06/2016  . Left Heart Cath and Coronary Angiography N/A 09/24/2016   Performed by Lorretta Harp, MD at [place] [facility]  . LEFT HEART CATH AND CORS/GRAFTS ANGIOGRAPHY N/A 02/11/2017   Performed by Jettie Booze, MD at [place] [facility]  . TRANSESOPHAGEAL ECHOCARDIOGRAM (TEE) N/A 09/28/2016   Performed by Ivin Poot, MD at  [place]  . Ultrasound Guidance For Vascular Access  02/11/2017   Performed by Jettie Booze, MD at [place] [facility]    Current Medications: Current Meds  Medication Sig  . acetaminophen (TYLENOL) 500 MG tablet Take 500-1,000 mg by mouth every 6 (six) hours as needed for headache.  Marland Kitchen aspirin 81 MG EC tablet Take 1 tablet (81 mg total) by mouth daily.  Marland Kitchen atorvastatin (LIPITOR) 80 MG tablet Take 1 tablet (80 mg total) by mouth daily.  . isosorbide mononitrate (IMDUR) 60 MG 24 hr tablet Take 1 tablet (60 mg total) daily by mouth. (Patient taking differently: Take 120 mg daily by mouth. )  . lisinopril (PRINIVIL,ZESTRIL) 2.5 MG tablet Take 1 tablet (2.5 mg total) by mouth daily.  . metoprolol tartrate (LOPRESSOR) 25 MG tablet Take 1 tablet (25 mg total) 2 (two) times daily by mouth.  . nitroGLYCERIN (NITROSTAT) 0.4 MG SL tablet Place 1 tablet (0.4 mg total) every 5 (five) minutes as needed under the tongue for chest pain.  . pantoprazole (PROTONIX) 40 MG tablet Take 40 mg by mouth 2 (two) times daily.  . ranolazine (RANEXA) 1000 MG SR tablet Take 1 tablet (1,000 mg total) 2 (two) times daily by mouth.  . vitamin B-12 (CYANOCOBALAMIN) 1000 MCG tablet Take 1,000 mcg by mouth daily.     Allergies:   Benadryl [diphenhydramine hcl (  sleep)] and Levofloxacin   Social History   Socioeconomic History  . Marital status: Married    Spouse name: None  . Number of children: None  . Years of education: None  . Highest education level: None  Social Needs  . Financial resource strain: None  . Food insecurity - worry: None  . Food insecurity - inability: None  . Transportation needs - medical: None  . Transportation needs - non-medical: None  Occupational History  . Occupation: Retired  Tobacco Use  . Smoking status: Never Smoker  . Smokeless tobacco: Never Used  Substance and Sexual Activity  . Alcohol use: No  . Drug use: No  . Sexual activity: Yes    Birth control/protection: None    Other Topics Concern  . None  Social History Narrative  . None     Family History: The patient's family history includes Heart attack in his father; Heart failure in his mother. ROS:   Please see the history of present illness.    All 14 point review of systems negative except as described per history of present illness  EKGs/Labs/Other Studies Reviewed:      Recent Labs: 09/26/2016: TSH 2.023 09/29/2016: Magnesium 2.2 10/03/2016: ALT 23 10/07/2016: Hemoglobin 10.4; Platelets 239 11/30/2016: BUN 22; Creatinine, Ser 1.36; Potassium 4.3; Sodium 139  Recent Lipid Panel    Component Value Date/Time   CHOL 116 09/26/2016 0232   TRIG 103 09/26/2016 0232   HDL 34 (L) 09/26/2016 0232   CHOLHDL 3.4 09/26/2016 0232   VLDL 21 09/26/2016 0232   LDLCALC 61 09/26/2016 0232    Physical Exam:    VS:  BP 124/60   Pulse (!) 54   Resp 16   Ht 5\' 5"  (1.651 m)   Wt 198 lb 12.8 oz (90.2 kg)   BMI 33.08 kg/m     Wt Readings from Last 3 Encounters:  03/08/17 198 lb 12.8 oz (90.2 kg)  03/01/17 201 lb 6.4 oz (91.4 kg)  02/23/17 199 lb (90.3 kg)     GEN:  Well nourished, well developed in no acute distress HEENT: Normal NECK: No JVD; No carotid bruits LYMPHATICS: No lymphadenopathy CARDIAC: RRR, no murmurs, no rubs, no gallops RESPIRATORY:  Clear to auscultation without rales, wheezing or rhonchi  ABDOMEN: Soft, non-tender, non-distended MUSCULOSKELETAL:  No edema; No deformity  SKIN: Warm and dry LOWER EXTREMITIES: no swelling NEUROLOGIC:  Alert and oriented x 3 PSYCHIATRIC:  Normal affect   ASSESSMENT:    1. Coronary artery disease of native artery of native heart with stable angina pectoris ([place])   2. Hx of CABG   3. Mixed hyperlipidemia   4. S/P CABG x 4    PLAN:    In order of problems listed above:  1. Coronary artery disease: Stable angina pectoris getting better he tells me 75% better but still has to take nitroglycerin from time to time 2. History of CABG: Wound is  healed completely obesity disappointment 3 out of 4 grafts completely occluded but he is getting better with medical therapy 3. Hyperlipidemia: We will continue present medications. 4. Overall he is getting better with medications hopefully will be able to get him to fully functional status.  If not there is a chance that opening completely occluded artery can bring him some benefits.   Medication Adjustments/Labs and Tests Ordered: Current medicines are reviewed at length with the patient today.  Concerns regarding medicines are outlined above.  No orders of the defined types were placed in  this encounter.  Medication changes: No orders of the defined types were placed in this encounter.   Signed, Park Liter, MD, [facility] 03/08/2017 11:45 AM    [place]

## 2017-03-08 NOTE — Patient Instructions (Addendum)
Medication Instructions:  Your physician has recommended you make the following change in your medication:  1.) START Xarelto 2.5 mg daily.  2.) INCREASE Imdur to 120 mg daily.   Labwork: None   Testing/Procedures: None   Follow-Up: Your physician recommends that you schedule a follow-up appointment in: 3 weeks  Any Other Special Instructions Will Be Listed Below (If Applicable).  Please note that any paperwork needing to be filled out by the provider will need to be addressed at the front desk prior to seeing the provider. Please note that any paperwork FMLA, Disability or other documents regarding health condition is subject to a $25.00 charge that must be received prior to completion of paperwork in the form of a money order or check.     If you need a refill on your cardiac medications before your next appointment, please call your pharmacy.

## 2017-03-08 NOTE — Addendum Note (Signed)
Addended by: Kathyrn Sheriff on: 03/08/2017 11:58 AM   Modules accepted: Orders

## 2017-03-08 NOTE — Addendum Note (Signed)
Addended by: Kathyrn Sheriff on: 03/08/2017 12:06 PM   Modules accepted: Orders

## 2017-03-17 ENCOUNTER — Other Ambulatory Visit: Payer: Self-pay

## 2017-03-17 ENCOUNTER — Telehealth: Payer: Self-pay | Admitting: Cardiology

## 2017-03-17 MED ORDER — ISOSORBIDE MONONITRATE ER 60 MG PO TB24: 60.0000 mg | ORAL_TABLET | Freq: Two times a day (BID) | ORAL | 11 refills | Status: DC

## 2017-03-17 NOTE — Telephone Encounter (Signed)
S/w wife who states that Ranexa is going to cost them around $400 with insurance and the card that was given to them for the discount would not work. I advised I would speak with Dr. Agustin Cree about this as pt has taken his last pill this am. She also states that since the increase in IMDUR at the last visit, the pt found that taking 60 mg twice daily made him feel much better than taking the full dose at once. A RX has been sent to the pharm for the 60 mg twice daily for the pt.

## 2017-03-17 NOTE — Telephone Encounter (Signed)
I have reached out to our Ranexa rep and asked for samples if available at which we can supply the pt with. Pt's wife is also calling his insurance to see if the cost for the medication is related to being in the doughnut hole. The pt's wife will call us back either today or tomorrow to let us know what his insurance suggest. We have also discussed Ran[facility] at which we will arrange for pt once we determine the cause of cost from insurance. Pt's wife states the insurance they have is new and started on Nov. 1.

## 2017-03-17 NOTE — Telephone Encounter (Signed)
Has questions about medications

## 2017-03-18 NOTE — Telephone Encounter (Signed)
Pt's wife called again today

## 2017-03-18 NOTE — Telephone Encounter (Signed)
Spoke with patient's wife, Pura Spice, she has contacted [employer]. They had advised her that the cost was high because he has to meet a deductible, and after it would be $47 a month. She stated that she was fine with $47 a month. The next concern is paying the amount for the deductible this month and then having to pay again at the beginning of the year due to insurance restart. At this point we are still waiting for samples. I will contact her tomorrow afternoon if we still do not have the samples.

## 2017-03-19 NOTE — Telephone Encounter (Signed)
Spoke with Claudette and let her know that the Renexa Rep had came and dropped of some samples. I had let her know that I had talked to the rep, who did let me know that Ranexa would be generic after February, but at this time the cost is unknown. I let her know that I will have enough samples for the month to get them through to get it from the pharmacy in January. She will come today and pick up samples.

## 2017-03-30 ENCOUNTER — Ambulatory Visit: Payer: Medicare Other | Admitting: Cardiology

## 2017-04-02 DIAGNOSIS — Z6831 Body mass index (BMI) 31.0-31.9, adult: Secondary | ICD-10-CM | POA: Diagnosis not present

## 2017-04-02 DIAGNOSIS — J019 Acute sinusitis, unspecified: Secondary | ICD-10-CM | POA: Diagnosis not present

## 2017-04-02 DIAGNOSIS — B9689 Other specified bacterial agents as the cause of diseases classified elsewhere: Secondary | ICD-10-CM | POA: Diagnosis not present

## 2017-04-07 ENCOUNTER — Ambulatory Visit (INDEPENDENT_AMBULATORY_CARE_PROVIDER_SITE_OTHER): Payer: Medicare Other | Admitting: Cardiology

## 2017-04-07 ENCOUNTER — Encounter: Payer: Self-pay | Admitting: Cardiology

## 2017-04-07 VITALS — BP 138/60 | HR 50 | Ht 65.0 in | Wt 194.1 lb

## 2017-04-07 DIAGNOSIS — I10 Essential (primary) hypertension: Secondary | ICD-10-CM | POA: Diagnosis not present

## 2017-04-07 DIAGNOSIS — I25118 Atherosclerotic heart disease of native coronary artery with other forms of angina pectoris: Secondary | ICD-10-CM

## 2017-04-07 DIAGNOSIS — Z951 Presence of aortocoronary bypass graft: Secondary | ICD-10-CM

## 2017-04-07 MED ORDER — RIVAROXABAN 2.5 MG PO TABS: 2.5000 mg | ORAL_TABLET | Freq: Two times a day (BID) | ORAL | 6 refills | Status: DC

## 2017-04-07 NOTE — Patient Instructions (Signed)
Medication Instructions:  Your physician has recommended you make the following change in your medication:  1) Start Xarelto 2.5 mg 1 tablet twice daily  Labwork: None ordered  Testing/Procedures: None ordered  Follow-Up: Your physician recommends that you schedule a follow-up appointment in: 2 months with Dr. Agustin Cree   Any Other Special Instructions Will Be Listed Below (If Applicable).     If you need a refill on your cardiac medications before your next appointment, please call your pharmacy.

## 2017-04-07 NOTE — Progress Notes (Signed)
Cardiology Office Note:    Date:  04/07/2017   ID:  Ernest Booker, DOB 1945/10/07, MRN [id number]  PCP:  Myer Peer, MD  Cardiologist:  Jenne Campus, MD    Referring MD: Myer Peer, MD   Chief Complaint  Patient presents with  . 3 week follow up  I am having cold  History of Present Illness:    Ernest Booker is a 71 y.o. male with coronary artery disease status post coronary artery bypass graft in summary sadly a few months later he started experiencing pain cardiac catheterization was done he occluded 3 out of 4 grafts only LIMA seems to be functioning properly.  Since that time we trying aggressive medical management which seems to be working.  He is doing fine he complained of having some shortness of breath and fatigue.  Last week he had a cold he went to his primary care physician who gave him Zithromax and he is getting better slowly.  I am trying to figure out how much he is able to do and he actually is doing quite well he go fishing on the regular basis last time he was fishing about 3 weeks ago.  [place] he can walk [place] there with no major difficulties but he said he paced himself.  Last time he had to use nitroglycerin was over the weekend when he was suffering with cold.  Otherwise he is nitroglycerin fairly rarely.  Overall I think we doing quite well with his medical therapy.  Past Medical History:  Diagnosis Date  . Anginal pain ([place])   . Arthritis   . Coronary artery disease   . Hyperlipidemia   . Hypertension   . Myocardial infarction [facility])     Past Surgical History:  Procedure Laterality Date  . CORONARY ARTERY BYPASS GRAFT N/A 09/28/2016   Procedure: CORONARY ARTERY BYPASS GRAFTING (CABG) x four, using left internal mammary artery and right leg greater saphenous vein harvested endoscopically;  Surgeon: Ivin Poot, MD;  Location: [place];  Service: Open Heart Surgery;  Laterality: N/A;  . IR THORACENTESIS ASP PLEURAL SPACE  W/IMG GUIDE  10/06/2016  . LEFT HEART CATH AND CORONARY ANGIOGRAPHY N/A 09/24/2016   Procedure: Left Heart Cath and Coronary Angiography;  Surgeon: Lorretta Harp, MD;  Location: [place] CV [facility];  Service: Cardiovascular;  Laterality: N/A;  . LEFT HEART CATH AND CORS/GRAFTS ANGIOGRAPHY N/A 02/11/2017   Procedure: LEFT HEART CATH AND CORS/GRAFTS ANGIOGRAPHY;  Surgeon: Jettie Booze, MD;  Location: [place] CV LAB;  Service: Cardiovascular;  Laterality: N/A;  . TEE WITHOUT CARDIOVERSION N/A 09/28/2016   Procedure: TRANSESOPHAGEAL ECHOCARDIOGRAM (TEE);  Surgeon: Prescott Gum, Collier Salina, MD;  Location: [place];  Service: Open Heart Surgery;  Laterality: N/A;  . ULTRASOUND GUIDANCE FOR VASCULAR ACCESS  02/11/2017   Procedure: Ultrasound Guidance For Vascular Access;  Surgeon: Jettie Booze, MD;  Location: [place] CV LAB;  Service: Cardiovascular;;    Current Medications: Current Meds  Medication Sig  . acetaminophen (TYLENOL) 500 MG tablet Take 500-1,000 mg by mouth every 6 (six) hours as needed for headache.  Marland Kitchen aspirin 81 MG EC tablet Take 1 tablet (81 mg total) by mouth daily.  Marland Kitchen atorvastatin (LIPITOR) 80 MG tablet Take 1 tablet (80 mg total) by mouth daily.  Marland Kitchen azithromycin (ZITHROMAX) 500 MG tablet Take 500 mg by mouth daily.  . isosorbide mononitrate (IMDUR) 60 MG 24 hr tablet Take 1 tablet (60 mg total)  by mouth 2 (two) times daily.  Marland Kitchen lisinopril (PRINIVIL,ZESTRIL) 2.5 MG tablet Take 1 tablet (2.5 mg total) by mouth daily.  . metoprolol tartrate (LOPRESSOR) 25 MG tablet Take 1 tablet (25 mg total) 2 (two) times daily by mouth.  . nitroGLYCERIN (NITROSTAT) 0.4 MG SL tablet Place 1 tablet (0.4 mg total) every 5 (five) minutes as needed under the tongue for chest pain.  . pantoprazole (PROTONIX) 40 MG tablet Take 40 mg by mouth 2 (two) times daily.  . ranolazine (RANEXA) 1000 MG SR tablet Take 1 tablet (1,000 mg total) 2 (two) times daily by mouth.  . Rivaroxaban (XARELTO) 2.5 MG  TABS Take 2.5 mg daily by mouth.  . vitamin B-12 (CYANOCOBALAMIN) 1000 MCG tablet Take 1,000 mcg by mouth daily.     Allergies:   Benadryl [diphenhydramine hcl (sleep)] and Levofloxacin   Social History   Socioeconomic History  . Marital status: Married    Spouse name: None  . Number of children: None  . Years of education: None  . Highest education level: None  Social Needs  . Financial resource strain: None  . Food insecurity - worry: None  . Food insecurity - inability: None  . Transportation needs - medical: None  . Transportation needs - non-medical: None  Occupational History  . Occupation: Retired  Tobacco Use  . Smoking status: Never Smoker  . Smokeless tobacco: Never Used  Substance and Sexual Activity  . Alcohol use: No  . Drug use: No  . Sexual activity: Yes    Birth control/protection: None  Other Topics Concern  . None  Social History Narrative  . None     Family History: The patient's family history includes Heart attack in his father; Heart failure in his mother. ROS:   Please see the history of present illness.    All 14 point review of systems negative except as described per history of present illness  EKGs/Labs/Other Studies Reviewed:      Recent Labs: 09/26/2016: TSH 2.023 09/29/2016: Magnesium 2.2 10/03/2016: ALT 23 10/07/2016: Hemoglobin 10.4; Platelets 239 11/30/2016: BUN 22; Creatinine, Ser 1.36; Potassium 4.3; Sodium 139  Recent Lipid Panel    Component Value Date/Time   CHOL 116 09/26/2016 0232   TRIG 103 09/26/2016 0232   HDL 34 (L) 09/26/2016 0232   CHOLHDL 3.4 09/26/2016 0232   VLDL 21 09/26/2016 0232   LDLCALC 61 09/26/2016 0232    Physical Exam:    VS:  BP 138/60   Pulse (!) 50   Ht 5\' 5"  (1.651 m)   Wt 194 lb 1.9 oz (88.1 kg)   SpO2 95%   BMI 32.30 kg/m     Wt Readings from Last 3 Encounters:  04/07/17 194 lb 1.9 oz (88.1 kg)  03/08/17 198 lb 12.8 oz (90.2 kg)  03/01/17 201 lb 6.4 oz (91.4 kg)     GEN:  Well  nourished, well developed in no acute distress HEENT: Normal NECK: No JVD; No carotid bruits LYMPHATICS: No lymphadenopathy CARDIAC: RRR, no murmurs, no rubs, no gallops RESPIRATORY:  Clear to auscultation without rales, wheezing or rhonchi  ABDOMEN: Soft, non-tender, non-distended MUSCULOSKELETAL:  No edema; No deformity  SKIN: Warm and dry LOWER EXTREMITIES: no swelling NEUROLOGIC:  Alert and oriented x 3 PSYCHIATRIC:  Normal affect   ASSESSMENT:    1. Coronary artery disease of native artery of native heart with stable angina pectoris ([place])   2. Essential hypertension   3. S/P CABG x 4    PLAN:  In order of problems listed above:  1. Coronary artery disease: Doing well from that point of view we will continue present management.  I will increase dose of Xarelto 2.5-2.5 twice daily which is appropriate dose 2. Essential hypertension doing well from that point of view we will continue present management 3. Status post coronary artery bypass graft doing well.   Medication Adjustments/Labs and Tests Ordered: Current medicines are reviewed at length with the patient today.  Concerns regarding medicines are outlined above.  No orders of the defined types were placed in this encounter.  Medication changes: No orders of the defined types were placed in this encounter.   Signed, Park Liter, MD, [facility] 04/07/2017 11:19 AM    [place]

## 2017-04-08 ENCOUNTER — Telehealth: Payer: Self-pay | Admitting: Cardiology

## 2017-04-08 NOTE — Telephone Encounter (Signed)
Patient is concerned regarding the price of the Xarelto that was started yesterday. Please advise??

## 2017-04-08 NOTE — Telephone Encounter (Signed)
Please call patients wife regarding the very expensive medicine that we called in for him yesterday, they need something much cheaper. Please call her!

## 2017-04-09 ENCOUNTER — Other Ambulatory Visit: Payer: Self-pay

## 2017-04-09 MED ORDER — CLOPIDOGREL BISULFATE 75 MG PO TABS: 75.0000 mg | ORAL_TABLET | Freq: Every day | ORAL | 1 refills | Status: DC

## 2017-04-09 NOTE — Telephone Encounter (Signed)
Plavix was sent into [facility] in [place].

## 2017-04-09 NOTE — Telephone Encounter (Signed)
Stop xarelto, start clopidogrel 75 mg po qd

## 2017-04-14 DIAGNOSIS — J189 Pneumonia, unspecified organism: Secondary | ICD-10-CM | POA: Diagnosis not present

## 2017-05-14 DIAGNOSIS — R42 Dizziness and giddiness: Secondary | ICD-10-CM | POA: Diagnosis not present

## 2017-05-14 DIAGNOSIS — R079 Chest pain, unspecified: Secondary | ICD-10-CM | POA: Diagnosis not present

## 2017-05-14 DIAGNOSIS — R531 Weakness: Secondary | ICD-10-CM | POA: Diagnosis not present

## 2017-05-14 DIAGNOSIS — R001 Bradycardia, unspecified: Secondary | ICD-10-CM | POA: Diagnosis not present

## 2017-05-24 DIAGNOSIS — J019 Acute sinusitis, unspecified: Secondary | ICD-10-CM | POA: Diagnosis not present

## 2017-05-24 DIAGNOSIS — B9689 Other specified bacterial agents as the cause of diseases classified elsewhere: Secondary | ICD-10-CM | POA: Diagnosis not present

## 2017-05-29 DIAGNOSIS — J111 Influenza due to unidentified influenza virus with other respiratory manifestations: Secondary | ICD-10-CM | POA: Diagnosis not present

## 2017-06-08 ENCOUNTER — Encounter: Payer: Self-pay | Admitting: Cardiology

## 2017-06-08 ENCOUNTER — Ambulatory Visit (INDEPENDENT_AMBULATORY_CARE_PROVIDER_SITE_OTHER): Payer: Medicare Other | Admitting: Cardiology

## 2017-06-08 VITALS — BP 98/70 | HR 55 | Ht 65.0 in | Wt 196.0 lb

## 2017-06-08 DIAGNOSIS — E782 Mixed hyperlipidemia: Secondary | ICD-10-CM

## 2017-06-08 DIAGNOSIS — I25118 Atherosclerotic heart disease of native coronary artery with other forms of angina pectoris: Secondary | ICD-10-CM | POA: Diagnosis not present

## 2017-06-08 DIAGNOSIS — I10 Essential (primary) hypertension: Secondary | ICD-10-CM | POA: Diagnosis not present

## 2017-06-08 DIAGNOSIS — Z951 Presence of aortocoronary bypass graft: Secondary | ICD-10-CM | POA: Diagnosis not present

## 2017-06-08 NOTE — Progress Notes (Signed)
Cardiology Office Note:    Date:  06/08/2017   ID:  Ernest Booker, DOB 05-20-1945, MRN [id number]  PCP:  Myer Peer, MD  Cardiologist:  Jenne Campus, MD    Referring MD: Myer Peer, MD   Chief Complaint  Patient presents with  . Follow-up  Doing fair  History of Present Illness:    Ernest Booker is a 72 y.o. male with coronary artery disease status post coronary artery bypass grafting December of last year however cardiac catheterization done a few months later showed occluded 3 out of 4 grafts.  He does have exertional angina however does have rare episodes.  I think he finally came to terms that he will never be perfectly fine.  He takes nitroglycerin sporadically.  He is still trying to be active last week he [place] however had to slow down and stopped because he felt weak and tired did not have any chest pain.  Still goes on fish sometimes however now because of poor weather he does not do it.  According to his wife who is with him in the room he is still fairly active.  However she described level of activity that he is able to do about 50% as compared to before bypass surgery.  We try to maximize his medical therapy.  Obviously the problem his blood pressure being low and he had difficulty tolerating higher dosages of medications.  He takes nitroglycerin on as needed basis.  Described to have some orthostatic hypotension dizziness when he gets up very quickly.  But he later had relief with that.  Past Medical History:  Diagnosis Date  . Anginal pain ([place])   . Arthritis   . Coronary artery disease   . Hyperlipidemia   . Hypertension   . Myocardial infarction [facility])     Past Surgical History:  Procedure Laterality Date  . CORONARY ARTERY BYPASS GRAFT N/A 09/28/2016   Procedure: CORONARY ARTERY BYPASS GRAFTING (CABG) x four, using left internal mammary artery and right leg greater saphenous vein harvested endoscopically;   Surgeon: Ivin Poot, MD;  Location: [place];  Service: Open Heart Surgery;  Laterality: N/A;  . IR THORACENTESIS ASP PLEURAL SPACE W/IMG GUIDE  10/06/2016  . LEFT HEART CATH AND CORONARY ANGIOGRAPHY N/A 09/24/2016   Procedure: Left Heart Cath and Coronary Angiography;  Surgeon: Lorretta Harp, MD;  Location: [place] CV LAB;  Service: Cardiovascular;  Laterality: N/A;  . LEFT HEART CATH AND CORS/GRAFTS ANGIOGRAPHY N/A 02/11/2017   Procedure: LEFT HEART CATH AND CORS/GRAFTS ANGIOGRAPHY;  Surgeon: Jettie Booze, MD;  Location: [place] [facility] LAB;  Service: Cardiovascular;  Laterality: N/A;  . TEE WITHOUT CARDIOVERSION N/A 09/28/2016   Procedure: TRANSESOPHAGEAL ECHOCARDIOGRAM (TEE);  Surgeon: Prescott Gum, Collier Salina, MD;  Location: [place];  Service: Open Heart Surgery;  Laterality: N/A;  . ULTRASOUND GUIDANCE FOR VASCULAR ACCESS  02/11/2017   Procedure: Ultrasound Guidance For Vascular Access;  Surgeon: Jettie Booze, MD;  Location: [place] CV LAB;  Service: Cardiovascular;;    Current Medications: Current Meds  Medication Sig  . acetaminophen (TYLENOL) 500 MG tablet Take 500-1,000 mg by mouth every 6 (six) hours as needed for headache.  Marland Kitchen aspirin 81 MG EC tablet Take 1 tablet (81 mg total) by mouth daily.  Marland Kitchen atorvastatin (LIPITOR) 80 MG tablet Take 1 tablet (80 mg total) by mouth daily.  Marland Kitchen azithromycin (ZITHROMAX) 500 MG tablet Take 500 mg by mouth  daily.  . clopidogrel (PLAVIX) 75 MG tablet Take 1 tablet (75 mg total) by mouth daily.  . isosorbide mononitrate (IMDUR) 60 MG 24 hr tablet Take 1 tablet (60 mg total) by mouth 2 (two) times daily.  Marland Kitchen lisinopril (PRINIVIL,ZESTRIL) 2.5 MG tablet Take 1 tablet (2.5 mg total) by mouth daily.  . metoprolol tartrate (LOPRESSOR) 25 MG tablet Take 1 tablet (25 mg total) 2 (two) times daily by mouth.  . nitroGLYCERIN (NITROSTAT) 0.4 MG SL tablet Place 1 tablet (0.4 mg total) every 5 (five) minutes as needed under the tongue for chest pain.    . pantoprazole (PROTONIX) 40 MG tablet Take 40 mg by mouth 2 (two) times daily.  . ranolazine (RANEXA) 1000 MG SR tablet Take 1 tablet (1,000 mg total) 2 (two) times daily by mouth.  . vitamin B-12 (CYANOCOBALAMIN) 1000 MCG tablet Take 1,000 mcg by mouth daily.     Allergies:   Benadryl [diphenhydramine hcl (sleep)] and Levofloxacin   Social History   Socioeconomic History  . Marital status: Married    Spouse name: None  . Number of children: None  . Years of education: None  . Highest education level: None  Social Needs  . Financial resource strain: None  . Food insecurity - worry: None  . Food insecurity - inability: None  . Transportation needs - medical: None  . Transportation needs - non-medical: None  Occupational History  . Occupation: Retired  Tobacco Use  . Smoking status: Never Smoker  . Smokeless tobacco: Never Used  Substance and Sexual Activity  . Alcohol use: No  . Drug use: No  . Sexual activity: Yes    Birth control/protection: None  Other Topics Concern  . None  Social History Narrative  . None     Family History: The patient's family history includes Heart attack in his father; Heart failure in his mother. ROS:   Please see the history of present illness.    All 14 point review of systems negative except as described per history of present illness  EKGs/Labs/Other Studies Reviewed:      Recent Labs: 09/26/2016: TSH 2.023 09/29/2016: Magnesium 2.2 10/03/2016: ALT 23 10/07/2016: Hemoglobin 10.4; Platelets 239 11/30/2016: BUN 22; Creatinine, Ser 1.36; Potassium 4.3; Sodium 139  Recent Lipid Panel    Component Value Date/Time   CHOL 116 09/26/2016 0232   TRIG 103 09/26/2016 0232   HDL 34 (L) 09/26/2016 0232   CHOLHDL 3.4 09/26/2016 0232   VLDL 21 09/26/2016 0232   LDLCALC 61 09/26/2016 0232    Physical Exam:    VS:  BP 98/70 (BP Location: Left Arm, Patient Position: Sitting, Cuff Size: Normal)   Pulse (!) 55   Ht 5\' 5"  (1.651 m)   Wt 196  lb (88.9 kg)   SpO2 97%   BMI 32.62 kg/m     Wt Readings from Last 3 Encounters:  06/08/17 196 lb (88.9 kg)  04/07/17 194 lb 1.9 oz (88.1 kg)  03/08/17 198 lb 12.8 oz (90.2 kg)     GEN:  Well nourished, well developed in no acute distress HEENT: Normal NECK: No JVD; No carotid bruits LYMPHATICS: No lymphadenopathy CARDIAC: RRR, no murmurs, no rubs, no gallops RESPIRATORY:  Clear to auscultation without rales, wheezing or rhonchi  ABDOMEN: Soft, non-tender, non-distended MUSCULOSKELETAL:  No edema; No deformity  SKIN: Warm and dry LOWER EXTREMITIES: no swelling NEUROLOGIC:  Alert and oriented x 3 PSYCHIATRIC:  Normal affect   ASSESSMENT:    1. Coronary artery disease  of native artery of native heart with stable angina pectoris ([place])   2. Essential hypertension   3. Hx of CABG   4. S/P CABG x 4   5. Mixed hyperlipidemia    PLAN:    In order of problems listed above:  1. Coronary artery disease: Stable angina pectoris.  We will continue present medications. 2. Essential hypertension: We have a present problem right now blood pressure being low.  We will continue present medications. 3. Mixed dyslipidemia: High intensity statin which I will continue. 4. He is on aspirin clopidogrel and Xarelto.  I will ask him to stop clopidogrel.   Medication Adjustments/Labs and Tests Ordered: Current medicines are reviewed at length with the patient today.  Concerns regarding medicines are outlined above.  No orders of the defined types were placed in this encounter.  Medication changes: No orders of the defined types were placed in this encounter.   Signed, Park Liter, MD, [facility] 06/08/2017 9:39 AM    [place]

## 2017-06-08 NOTE — Addendum Note (Signed)
Addended by: Aleatha Borer on: 06/08/2017 09:46 AM   Modules accepted: Orders

## 2017-06-08 NOTE — Patient Instructions (Signed)
Medication Instructions:  Your physician has recommended you make the following change in your medication:  Stop your Plavix  Labwork: None ordered  Testing/Procedures: None ordered  Follow-Up: Your physician recommends that you schedule a follow-up appointment in: 3 months with Dr. Agustin Cree   Any Other Special Instructions Will Be Listed Below (If Applicable).     If you need a refill on your cardiac medications before your next appointment, please call your pharmacy.

## 2017-09-14 ENCOUNTER — Encounter: Payer: Self-pay | Admitting: Cardiology

## 2017-09-14 ENCOUNTER — Ambulatory Visit (INDEPENDENT_AMBULATORY_CARE_PROVIDER_SITE_OTHER): Payer: Medicare Other | Admitting: Cardiology

## 2017-09-14 VITALS — BP 106/62 | HR 56 | Ht 65.0 in | Wt 201.8 lb

## 2017-09-14 DIAGNOSIS — E782 Mixed hyperlipidemia: Secondary | ICD-10-CM

## 2017-09-14 DIAGNOSIS — I10 Essential (primary) hypertension: Secondary | ICD-10-CM | POA: Diagnosis not present

## 2017-09-14 DIAGNOSIS — I25118 Atherosclerotic heart disease of native coronary artery with other forms of angina pectoris: Secondary | ICD-10-CM | POA: Diagnosis not present

## 2017-09-14 DIAGNOSIS — Z951 Presence of aortocoronary bypass graft: Secondary | ICD-10-CM | POA: Diagnosis not present

## 2017-09-14 NOTE — Patient Instructions (Signed)
Medication Instructions:  Your physician recommends that you continue on your current medications as directed. Please refer to the Current Medication list given to you today.   Labwork: Your physician recommends that you return for lab work today: lipid panel.   Testing/Procedures: You had an EKG today.   Follow-Up: Your physician wants you to follow-up in: 3 months. You will receive a reminder letter in the mail two months in advance. If you don't receive a letter, please call our office to schedule the follow-up appointment.   If you need a refill on your cardiac medications before your next appointment, please call your pharmacy.   Thank you for choosing [facility]! Robyne Peers, RN [phone]

## 2017-09-14 NOTE — Progress Notes (Signed)
Cardiology Office Note:    Date:  09/14/2017   ID:  Ernest Booker, DOB March 29, 1946, MRN [id number]  PCP:  Myer Peer, MD  Cardiologist:  Jenne Campus, MD    Referring MD: Myer Peer, MD   Chief Complaint  Patient presents with  . Follow-up  Doing fairly well  History of Present Illness:    Ernest Booker is a 72 y.o. male with coronary artery bypass graft however shortly after bypass surgery she was find to have 3 out of 4 grafts being completely occluded.  Cardiac catheterization was done was not amenable for any intervention.  Medical therapy is implemented.  He seems to be doing well last time I seen him he reported to feel about 50% better as in the worst time however also 50% of what he was able to do before bypass surgery.  He still likes fishing he goes to walk with no difficulties.  Since October he was only one bottle of nitroglycerin of 25 tablets.  Yesterday had some episode that she become profoundly weak but no chest pain tightness squeezing pressure burning chest.  Few minutes later his blood pressure was checked was actually elevated.  Overall he feels quite well considering.  Past Medical History:  Diagnosis Date  . Anginal pain ([place])   . Arthritis   . Coronary artery disease   . Hyperlipidemia   . Hypertension   . Myocardial infarction [facility])     Past Surgical History:  Procedure Laterality Date  . CORONARY ARTERY BYPASS GRAFT N/A 09/28/2016   Procedure: CORONARY ARTERY BYPASS GRAFTING (CABG) x four, using left internal mammary artery and right leg greater saphenous vein harvested endoscopically;  Surgeon: Ivin Poot, MD;  Location: [place];  Service: Open Heart Surgery;  Laterality: N/A;  . IR THORACENTESIS ASP PLEURAL SPACE W/IMG GUIDE  10/06/2016  . LEFT HEART CATH AND CORONARY ANGIOGRAPHY N/A 09/24/2016   Procedure: Left Heart Cath and Coronary Angiography;  Surgeon: Lorretta Harp, MD;  Location: [place] CV [facility];  Service: Cardiovascular;   Laterality: N/A;  . LEFT HEART CATH AND CORS/GRAFTS ANGIOGRAPHY N/A 02/11/2017   Procedure: LEFT HEART CATH AND CORS/GRAFTS ANGIOGRAPHY;  Surgeon: Jettie Booze, MD;  Location: [place] CV [facility];  Service: Cardiovascular;  Laterality: N/A;  . TEE WITHOUT CARDIOVERSION N/A 09/28/2016   Procedure: TRANSESOPHAGEAL ECHOCARDIOGRAM (TEE);  Surgeon: Prescott Gum, Collier Salina, MD;  Location: [place];  Service: Open Heart Surgery;  Laterality: N/A;  . ULTRASOUND GUIDANCE FOR VASCULAR ACCESS  02/11/2017   Procedure: Ultrasound Guidance For Vascular Access;  Surgeon: Jettie Booze, MD;  Location: [place] CV LAB;  Service: Cardiovascular;;    Current Medications: Current Meds  Medication Sig  . acetaminophen (TYLENOL) 500 MG tablet Take 500-1,000 mg by mouth every 6 (six) hours as needed for headache.  Marland Kitchen aspirin 81 MG EC tablet Take 1 tablet (81 mg total) by mouth daily.  Marland Kitchen atorvastatin (LIPITOR) 80 MG tablet Take 1 tablet (80 mg total) by mouth daily.  . isosorbide mononitrate (IMDUR) 60 MG 24 hr tablet Take 1 tablet (60 mg total) by mouth 2 (two) times daily.  Marland Kitchen lisinopril (PRINIVIL,ZESTRIL) 2.5 MG tablet Take 1 tablet (2.5 mg total) by mouth daily.  . metoprolol tartrate (LOPRESSOR) 25 MG tablet Take 1 tablet (25 mg total) 2 (two) times daily by mouth.  . nitroGLYCERIN (NITROSTAT) 0.4 MG SL tablet Place 1 tablet (0.4 mg total) every 5 (five) minutes as needed under the tongue for chest  pain.  . pantoprazole (PROTONIX) 40 MG tablet Take 40 mg by mouth 2 (two) times daily.  . ranolazine (RANEXA) 1000 MG SR tablet Take 1 tablet (1,000 mg total) 2 (two) times daily by mouth.  . vitamin B-12 (CYANOCOBALAMIN) 1000 MCG tablet Take 1,000 mcg by mouth daily.     Allergies:   Benadryl [diphenhydramine hcl (sleep)] and Levofloxacin   Social History   Socioeconomic History  . Marital status: Married    Spouse name: Not on file  . Number of children: Not on file  . Years of education: Not on file    . Highest education level: Not on file  Occupational History  . Occupation: Retired  Scientific laboratory technician  . Financial resource strain: Not on file  . Food insecurity:    Worry: Not on file    Inability: Not on file  . Transportation needs:    Medical: Not on file    Non-medical: Not on file  Tobacco Use  . Smoking status: Never Smoker  . Smokeless tobacco: Never Used  Substance and Sexual Activity  . Alcohol use: No  . Drug use: No  . Sexual activity: Yes    Birth control/protection: None  Lifestyle  . Physical activity:    Days per week: Not on file    Minutes per session: Not on file  . Stress: Not on file  Relationships  . Social connections:    Talks on phone: Not on file    Gets together: Not on file    Attends religious service: Not on file    Active member of club or organization: Not on file    Attends meetings of clubs or organizations: Not on file    Relationship status: Not on file  Other Topics Concern  . Not on file  Social History Narrative  . Not on file     Family History: The patient's family history includes Heart attack in his father; Heart failure in his mother. ROS:   Please see the history of present illness.    All 14 point review of systems negative except as described per history of present illness  EKGs/Labs/Other Studies Reviewed:      Recent Labs: 09/26/2016: TSH 2.023 09/29/2016: Magnesium 2.2 10/03/2016: ALT 23 10/07/2016: Hemoglobin 10.4; Platelets 239 11/30/2016: BUN 22; Creatinine, Ser 1.36; Potassium 4.3; Sodium 139  Recent Lipid Panel    Component Value Date/Time   CHOL 116 09/26/2016 0232   TRIG 103 09/26/2016 0232   HDL 34 (L) 09/26/2016 0232   CHOLHDL 3.4 09/26/2016 0232   VLDL 21 09/26/2016 0232   LDLCALC 61 09/26/2016 0232    Physical Exam:    VS:  BP 106/62   Pulse (!) 56   Ht 5\' 5"  (1.651 m)   Wt 201 lb 12.8 oz (91.5 kg)   SpO2 96%   BMI 33.58 kg/m     Wt Readings from Last 3 Encounters:  09/14/17 201 lb 12.8  oz (91.5 kg)  06/08/17 196 lb (88.9 kg)  04/07/17 194 lb 1.9 oz (88.1 kg)     GEN:  Well nourished, well developed in no acute distress HEENT: Normal NECK: No JVD; No carotid bruits LYMPHATICS: No lymphadenopathy CARDIAC: RRR, no murmurs, no rubs, no gallops RESPIRATORY:  Clear to auscultation without rales, wheezing or rhonchi  ABDOMEN: Soft, non-tender, non-distended MUSCULOSKELETAL:  No edema; No deformity  SKIN: Warm and dry LOWER EXTREMITIES: no swelling NEUROLOGIC:  Alert and oriented x 3 PSYCHIATRIC:  Normal affect  ASSESSMENT:    1. Coronary artery disease of native artery of native heart with stable angina pectoris ([place])   2. Essential hypertension   3. Hx of CABG   4. Mixed hyperlipidemia    PLAN:    In order of problems listed above:  1. Coronary artery disease doing well I will ask him to have a EKG today make sure nothing happened yesterday. 2. Essential hypertension blood pressure well controlled continue present management. 3. History of CABG with 3 out of 4 grafts completely occluded medical therapy 4. Dyslipidemia: We will ask him to have fasting lipid profile done.  His wife brought him issue that he does have some problem with her memory and she is asking me about potential cause of it.  I told her probably normal aging process.  On top of that there is some data telling about may be statin during the problem will wait for fasting lipid profile and then potentially switch him to a different statin.   Medication Adjustments/Labs and Tests Ordered: Current medicines are reviewed at length with the patient today.  Concerns regarding medicines are outlined above.  No orders of the defined types were placed in this encounter.  Medication changes: No orders of the defined types were placed in this encounter.   Signed, Park Liter, MD, [facility] 09/14/2017 8:25 AM    [place]

## 2017-09-15 DIAGNOSIS — I2510 Atherosclerotic heart disease of native coronary artery without angina pectoris: Secondary | ICD-10-CM | POA: Diagnosis not present

## 2017-09-15 DIAGNOSIS — N183 Chronic kidney disease, stage 3 unspecified: Secondary | ICD-10-CM

## 2017-09-15 DIAGNOSIS — I25119 Atherosclerotic heart disease of native coronary artery with unspecified angina pectoris: Secondary | ICD-10-CM | POA: Diagnosis not present

## 2017-09-15 DIAGNOSIS — I129 Hypertensive chronic kidney disease with stage 1 through stage 4 chronic kidney disease, or unspecified chronic kidney disease: Secondary | ICD-10-CM | POA: Diagnosis not present

## 2017-09-15 DIAGNOSIS — Z79899 Other long term (current) drug therapy: Secondary | ICD-10-CM | POA: Diagnosis not present

## 2017-09-15 DIAGNOSIS — Z7901 Long term (current) use of anticoagulants: Secondary | ICD-10-CM | POA: Diagnosis not present

## 2017-09-15 DIAGNOSIS — R079 Chest pain, unspecified: Secondary | ICD-10-CM

## 2017-09-15 DIAGNOSIS — Z888 Allergy status to other drugs, medicaments and biological substances status: Secondary | ICD-10-CM | POA: Diagnosis not present

## 2017-09-15 DIAGNOSIS — I255 Ischemic cardiomyopathy: Secondary | ICD-10-CM | POA: Diagnosis not present

## 2017-09-15 DIAGNOSIS — E785 Hyperlipidemia, unspecified: Secondary | ICD-10-CM | POA: Diagnosis not present

## 2017-09-15 DIAGNOSIS — J90 Pleural effusion, not elsewhere classified: Secondary | ICD-10-CM | POA: Diagnosis not present

## 2017-09-15 DIAGNOSIS — R0789 Other chest pain: Secondary | ICD-10-CM | POA: Diagnosis not present

## 2017-09-15 DIAGNOSIS — R0602 Shortness of breath: Secondary | ICD-10-CM | POA: Diagnosis not present

## 2017-09-15 DIAGNOSIS — I4891 Unspecified atrial fibrillation: Secondary | ICD-10-CM

## 2017-09-15 DIAGNOSIS — Z7982 Long term (current) use of aspirin: Secondary | ICD-10-CM | POA: Diagnosis not present

## 2017-09-15 DIAGNOSIS — Z951 Presence of aortocoronary bypass graft: Secondary | ICD-10-CM | POA: Diagnosis not present

## 2017-09-15 HISTORY — DX: Chronic kidney disease, stage 3 unspecified: N18.30

## 2017-09-15 HISTORY — DX: Unspecified atrial fibrillation: I48.91

## 2017-09-15 HISTORY — DX: Chest pain, unspecified: R07.9

## 2017-09-15 LAB — LIPID PANEL
CHOL/HDL RATIO: 2.3 ratio (ref 0.0–5.0)
CHOLESTEROL TOTAL: 96 mg/dL — AB (ref 100–199)
HDL: 42 mg/dL (ref >39)
LDL CALC: 32 mg/dL (ref 0–99)
TRIGLYCERIDES: 108 mg/dL (ref 0–149)
VLDL Cholesterol Cal: 22 mg/dL (ref 5–40)

## 2017-09-16 DIAGNOSIS — R079 Chest pain, unspecified: Secondary | ICD-10-CM | POA: Diagnosis not present

## 2017-09-16 DIAGNOSIS — I2510 Atherosclerotic heart disease of native coronary artery without angina pectoris: Secondary | ICD-10-CM | POA: Diagnosis not present

## 2017-09-17 MED ORDER — RANOLAZINE ER 500 MG PO TB12
1000.00 | ORAL_TABLET | ORAL | Status: DC
Start: 2017-09-17 — End: 2017-09-17

## 2017-09-17 MED ORDER — PANTOPRAZOLE SODIUM 40 MG PO TBEC
40.00 | DELAYED_RELEASE_TABLET | ORAL | Status: DC
Start: 2017-09-17 — End: 2017-09-17

## 2017-09-17 MED ORDER — ASPIRIN EC 81 MG PO TBEC
81.00 | DELAYED_RELEASE_TABLET | ORAL | Status: DC
Start: 2017-09-17 — End: 2017-09-17

## 2017-09-17 MED ORDER — ATORVASTATIN CALCIUM 40 MG PO TABS
80.00 | ORAL_TABLET | ORAL | Status: DC
Start: 2017-09-17 — End: 2017-09-17

## 2017-09-17 MED ORDER — ISOSORBIDE MONONITRATE ER 30 MG PO TB24
60.00 | ORAL_TABLET | ORAL | Status: DC
Start: 2017-09-17 — End: 2017-09-17

## 2017-09-17 MED ORDER — LISINOPRIL 5 MG PO TABS
2.50 | ORAL_TABLET | ORAL | Status: DC
Start: 2017-09-17 — End: 2017-09-17

## 2017-09-17 MED ORDER — GENERIC EXTERNAL MEDICATION
1000.00 | Status: DC
Start: 2017-09-17 — End: 2017-09-17

## 2017-09-17 MED ORDER — NITROGLYCERIN 0.4 MG SL SUBL
0.40 | SUBLINGUAL_TABLET | SUBLINGUAL | Status: DC
Start: ? — End: 2017-09-17

## 2017-09-17 MED ORDER — MORPHINE SULFATE 2 MG/ML IJ SOLN
2.00 | INTRAMUSCULAR | Status: DC
Start: ? — End: 2017-09-17

## 2017-09-17 MED ORDER — METOPROLOL TARTRATE 25 MG PO TABS
25.00 | ORAL_TABLET | ORAL | Status: DC
Start: 2017-09-17 — End: 2017-09-17

## 2017-09-20 ENCOUNTER — Encounter (INDEPENDENT_AMBULATORY_CARE_PROVIDER_SITE_OTHER): Payer: Self-pay

## 2017-09-22 ENCOUNTER — Ambulatory Visit (INDEPENDENT_AMBULATORY_CARE_PROVIDER_SITE_OTHER): Payer: Medicare Other | Admitting: Cardiology

## 2017-09-22 ENCOUNTER — Encounter: Payer: Self-pay | Admitting: Cardiology

## 2017-09-22 VITALS — BP 126/62 | HR 56 | Ht 65.0 in | Wt 198.8 lb

## 2017-09-22 DIAGNOSIS — I255 Ischemic cardiomyopathy: Secondary | ICD-10-CM

## 2017-09-22 DIAGNOSIS — I25118 Atherosclerotic heart disease of native coronary artery with other forms of angina pectoris: Secondary | ICD-10-CM | POA: Diagnosis not present

## 2017-09-22 DIAGNOSIS — Z951 Presence of aortocoronary bypass graft: Secondary | ICD-10-CM

## 2017-09-22 DIAGNOSIS — E782 Mixed hyperlipidemia: Secondary | ICD-10-CM

## 2017-09-22 MED ORDER — ISOSORBIDE MONONITRATE ER 120 MG PO TB24: 120.0000 mg | ORAL_TABLET | Freq: Every day | ORAL | 3 refills | Status: DC

## 2017-09-22 NOTE — Patient Instructions (Addendum)
Medication Instructions:  Your physician has recommended you make the following change in your medication:  CHANGE isosorbide mononitrate (imdur) 120 mg daily: take two 60 mg tablets once daily until you run out of this medication, then pick up new prescription for 120 mg tablets and take 1 tablet daily.    Labwork: None  Testing/Procedures: You had an EKG today.   Follow-Up: Your physician recommends that you schedule a follow-up appointment in: 2 months.  If you need a refill on your cardiac medications before your next appointment, please call your pharmacy.   Thank you for choosing [facility]! Robyne Peers, RN [phone]

## 2017-09-22 NOTE — Progress Notes (Signed)
Cardiology Office Note:    Date:  09/22/2017   ID:  Ernest Booker, DOB 1945/09/21, MRN [id number]  PCP:  Myer Peer, MD  Cardiologist:  Jenne Campus, MD    Referring MD: Myer Peer, MD   Chief Complaint  Patient presents with  . Hospitalization Follow-up  Had episode of chest pain that required hospitalization  History of Present Illness:    Ernest Booker is a 72 y.o. male with coronary artery disease status post coronary artery bypass graft occluded 3 out of 4 grafts.  Doing relatively well however recently required hospitalization because of prolonged episode of pain.  Biochemical markers were normal echocardiogram showed preserved left ventricular ejection fraction since that time he is doing well.  We talked about options for this situation I have noted that he is taking Imdur twice daily asked him to take it once a day.  He said nitroglycerin seems to be helping and hopefully by this maneuver I will be able to suppress those pains.  We talked about potentially doing cardiac catheterization however I would not push for this right now if pain became more frequent then will reconsider.  Past Medical History:  Diagnosis Date  . Anginal pain ([place])   . Arthritis   . Coronary artery disease   . Hyperlipidemia   . Hypertension   . Myocardial infarction [facility])     Past Surgical History:  Procedure Laterality Date  . CORONARY ARTERY BYPASS GRAFT N/A 09/28/2016   Procedure: CORONARY ARTERY BYPASS GRAFTING (CABG) x four, using left internal mammary artery and right leg greater saphenous vein harvested endoscopically;  Surgeon: Ivin Poot, MD;  Location: [place];  Service: Open Heart Surgery;  Laterality: N/A;  . IR THORACENTESIS ASP PLEURAL SPACE W/IMG GUIDE  10/06/2016  . LEFT HEART CATH AND CORONARY ANGIOGRAPHY N/A 09/24/2016   Procedure: Left Heart Cath and Coronary Angiography;  Surgeon: Lorretta Harp, MD;  Location: [place] CV LAB;  Service: Cardiovascular;   Laterality: N/A;  . LEFT HEART CATH AND CORS/GRAFTS ANGIOGRAPHY N/A 02/11/2017   Procedure: LEFT HEART CATH AND CORS/GRAFTS ANGIOGRAPHY;  Surgeon: Jettie Booze, MD;  Location: [place] CV LAB;  Service: Cardiovascular;  Laterality: N/A;  . TEE WITHOUT CARDIOVERSION N/A 09/28/2016   Procedure: TRANSESOPHAGEAL ECHOCARDIOGRAM (TEE);  Surgeon: Prescott Gum, Collier Salina, MD;  Location: [place];  Service: Open Heart Surgery;  Laterality: N/A;  . ULTRASOUND GUIDANCE FOR VASCULAR ACCESS  02/11/2017   Procedure: Ultrasound Guidance For Vascular Access;  Surgeon: Jettie Booze, MD;  Location: [place] CV LAB;  Service: Cardiovascular;;    Current Medications: Current Meds  Medication Sig  . acetaminophen (TYLENOL) 500 MG tablet Take 500-1,000 mg by mouth every 6 (six) hours as needed for headache.  Marland Kitchen aspirin 81 MG EC tablet Take 1 tablet (81 mg total) by mouth daily.  Marland Kitchen atorvastatin (LIPITOR) 80 MG tablet Take 1 tablet (80 mg total) by mouth daily.  . isosorbide mononitrate (IMDUR) 60 MG 24 hr tablet Take 1 tablet (60 mg total) by mouth 2 (two) times daily.  Marland Kitchen lisinopril (PRINIVIL,ZESTRIL) 2.5 MG tablet Take 1 tablet (2.5 mg total) by mouth daily.  . metoprolol tartrate (LOPRESSOR) 25 MG tablet Take 1 tablet (25 mg total) 2 (two) times daily by mouth.  . nitroGLYCERIN (NITROSTAT) 0.4 MG SL tablet Place 1 tablet (0.4 mg total) every 5 (five) minutes as needed under the tongue for chest pain.  . pantoprazole (PROTONIX) 40 MG tablet Take 40 mg by  mouth 2 (two) times daily.  . ranolazine (RANEXA) 1000 MG SR tablet Take 1 tablet (1,000 mg total) 2 (two) times daily by mouth.  . rivaroxaban (XARELTO) 2.5 MG TABS tablet Take 2.5 mg by mouth 2 (two) times daily.  . vitamin B-12 (CYANOCOBALAMIN) 1000 MCG tablet Take 1,000 mcg by mouth daily.     Allergies:   Benadryl [diphenhydramine hcl (sleep)] and Levofloxacin   Social History   Socioeconomic History  . Marital status: Married    Spouse name:  Not on file  . Number of children: Not on file  . Years of education: Not on file  . Highest education level: Not on file  Occupational History  . Occupation: Retired  Scientific laboratory technician  . Financial resource strain: Not on file  . Food insecurity:    Worry: Not on file    Inability: Not on file  . Transportation needs:    Medical: Not on file    Non-medical: Not on file  Tobacco Use  . Smoking status: Never Smoker  . Smokeless tobacco: Never Used  Substance and Sexual Activity  . Alcohol use: No  . Drug use: No  . Sexual activity: Yes    Birth control/protection: None  Lifestyle  . Physical activity:    Days per week: Not on file    Minutes per session: Not on file  . Stress: Not on file  Relationships  . Social connections:    Talks on phone: Not on file    Gets together: Not on file    Attends religious service: Not on file    Active member of club or organization: Not on file    Attends meetings of clubs or organizations: Not on file    Relationship status: Not on file  Other Topics Concern  . Not on file  Social History Narrative  . Not on file     Family History: The patient's family history includes Heart attack in his father; Heart failure in his mother. ROS:   Please see the history of present illness.    All 14 point review of systems negative except as described per history of present illness  EKGs/Labs/Other Studies Reviewed:      Recent Labs: 09/26/2016: TSH 2.023 09/29/2016: Magnesium 2.2 10/03/2016: ALT 23 10/07/2016: Hemoglobin 10.4; Platelets 239 11/30/2016: BUN 22; Creatinine, Ser 1.36; Potassium 4.3; Sodium 139  Recent Lipid Panel    Component Value Date/Time   CHOL 96 (L) 09/14/2017 0840   TRIG 108 09/14/2017 0840   HDL 42 09/14/2017 0840   CHOLHDL 2.3 09/14/2017 0840   CHOLHDL 3.4 09/26/2016 0232   VLDL 21 09/26/2016 0232   LDLCALC 32 09/14/2017 0840    Physical Exam:    VS:  BP 126/62   Pulse (!) 56   Ht 5\' 5"  (1.651 m)   Wt 198 lb  12.8 oz (90.2 kg)   SpO2 96%   BMI 33.08 kg/m     Wt Readings from Last 3 Encounters:  09/22/17 198 lb 12.8 oz (90.2 kg)  09/14/17 201 lb 12.8 oz (91.5 kg)  06/08/17 196 lb (88.9 kg)     GEN:  Well nourished, well developed in no acute distress HEENT: Normal NECK: No JVD; No carotid bruits LYMPHATICS: No lymphadenopathy CARDIAC: RRR, no murmurs, no rubs, no gallops RESPIRATORY:  Clear to auscultation without rales, wheezing or rhonchi  ABDOMEN: Soft, non-tender, non-distended MUSCULOSKELETAL:  No edema; No deformity  SKIN: Warm and dry LOWER EXTREMITIES: no swelling NEUROLOGIC:  Alert  and oriented x 3 PSYCHIATRIC:  Normal affect   ASSESSMENT:    1. Coronary artery disease of native artery of native heart with stable angina pectoris ([place])   2. Ischemic cardiomyopathy   3. Hx of CABG   4. Mixed hyperlipidemia    PLAN:    In order of problems listed above:  1. Coronary artery disease: With diffuse disease.  Plan as outlined above. 2. Ischemic cardia myopathy blood, echocardiogram done recently showed preserved left ventricular ejection fraction. 3. History of coronary artery bypass graft.  Failure of 3 out of 4 grafts.  But on medical therapy. 4. Mixed dyslipidemia on high intensity statin which I will continue.  ECG during the office in 2 months or sooner if he has a problem   Medication Adjustments/Labs and Tests Ordered: Current medicines are reviewed at length with the patient today.  Concerns regarding medicines are outlined above.  No orders of the defined types were placed in this encounter.  Medication changes: No orders of the defined types were placed in this encounter.   Signed, Park Liter, MD, [facility] 09/22/2017 10:03 AM    [place]

## 2017-10-26 DIAGNOSIS — S91339A Puncture wound without foreign body, unspecified foot, initial encounter: Secondary | ICD-10-CM | POA: Diagnosis not present

## 2017-10-26 DIAGNOSIS — W450XXA Nail entering through skin, initial encounter: Secondary | ICD-10-CM | POA: Diagnosis not present

## 2017-11-15 ENCOUNTER — Telehealth: Payer: Self-pay | Admitting: Cardiology

## 2017-11-15 MED ORDER — RANOLAZINE ER 1000 MG PO TB12
1000.0000 mg | ORAL_TABLET | Freq: Two times a day (BID) | ORAL | 6 refills | Status: DC
Start: 2017-11-15 — End: 2018-02-28

## 2017-11-15 NOTE — Telephone Encounter (Signed)
Refill [facility]a [facility] [place] as requested/

## 2017-11-15 NOTE — Telephone Encounter (Signed)
Ernest Booker Spouse [phone]  [facility] [address]  ranolazine (RANEXA) 1000 MG SR tablet  Ernest called to say that [name] is about out of his ranolazine (RANEXA) 1000 MG SR tablet She has notified the pharmacy, but she also wanted to let us know so that he does not run completely out.Marland Kitchen

## 2017-11-23 ENCOUNTER — Ambulatory Visit (INDEPENDENT_AMBULATORY_CARE_PROVIDER_SITE_OTHER): Payer: Medicare Other | Admitting: Cardiology

## 2017-11-23 ENCOUNTER — Encounter: Payer: Self-pay | Admitting: Cardiology

## 2017-11-23 VITALS — BP 120/72 | HR 53 | Ht 65.0 in | Wt 200.0 lb

## 2017-11-23 DIAGNOSIS — Z951 Presence of aortocoronary bypass graft: Secondary | ICD-10-CM | POA: Diagnosis not present

## 2017-11-23 DIAGNOSIS — I25118 Atherosclerotic heart disease of native coronary artery with other forms of angina pectoris: Secondary | ICD-10-CM

## 2017-11-23 DIAGNOSIS — E782 Mixed hyperlipidemia: Secondary | ICD-10-CM

## 2017-11-23 MED ORDER — AMLODIPINE BESYLATE 2.5 MG PO TABS: 2.5000 mg | ORAL_TABLET | Freq: Every day | ORAL | 3 refills | Status: DC

## 2017-11-23 NOTE — Patient Instructions (Signed)
Medication Instructions:  Your physician has recommended you make the following change in your medication:   START: Amlodipine 2.5 mg daily.   Labwork: None.  Testing/Procedures: None.  Follow-Up: Your physician recommends that you schedule a follow-up appointment in: 1 month   Any Other Special Instructions Will Be Listed Below (If Applicable).     If you need a refill on your cardiac medications before your next appointment, please call your pharmacy.  Amlodipine tablets What is this medicine? AMLODIPINE (am LOE di peen) is a calcium-channel blocker. It affects the amount of calcium found in your heart and muscle cells. This relaxes your blood vessels, which can reduce the amount of work the heart has to do. This medicine is used to lower high blood pressure. It is also used to prevent chest pain. This medicine may be used for other purposes; ask your health care provider or pharmacist if you have questions. COMMON BRAND NAME(S): Norvasc What should I tell my health care provider before I take this medicine? They need to know if you have any of these conditions: -heart problems like heart failure or aortic stenosis -liver disease -an unusual or allergic reaction to amlodipine, other medicines, foods, dyes, or preservatives -pregnant or trying to get pregnant -breast-feeding How should I use this medicine? Take this medicine by mouth with a glass of water. Follow the directions on the prescription label. Take your medicine at regular intervals. Do not take more medicine than directed. Talk to your pediatrician regarding the use of this medicine in children. Special care may be needed. This medicine has been used in children as young as 6. Persons over 6 years old may have a stronger reaction to this medicine and need smaller doses. Overdosage: If you think you have taken too much of this medicine contact a poison control center or emergency room at once. NOTE: This medicine is  only for you. Do not share this medicine with others. What if I miss a dose? If you miss a dose, take it as soon as you can. If it is almost time for your next dose, take only that dose. Do not take double or extra doses. What may interact with this medicine? -herbal or dietary supplements -local or general anesthetics -medicines for high blood pressure -medicines for prostate problems -rifampin This list may not describe all possible interactions. Give your health care provider a list of all the medicines, herbs, non-prescription drugs, or dietary supplements you use. Also tell them if you smoke, drink alcohol, or use illegal drugs. Some items may interact with your medicine. What should I watch for while using this medicine? Visit your doctor or health care professional for regular check ups. Check your blood pressure and pulse rate regularly. Ask your health care professional what your blood pressure and pulse rate should be, and when you should contact him or her. This medicine may make you feel confused, dizzy or lightheaded. Do not drive, use machinery, or do anything that needs mental alertness until you know how this medicine affects you. To reduce the risk of dizzy or fainting spells, do not sit or stand up quickly, especially if you are an older patient. Avoid alcoholic drinks; they can make you more dizzy. Do not suddenly stop taking amlodipine. Ask your doctor or health care professional how you can gradually reduce the dose. What side effects may I notice from receiving this medicine? Side effects that you should report to your doctor or health care professional as soon as  possible: -allergic reactions like skin rash, itching or hives, swelling of the face, lips, or tongue -breathing problems -changes in vision or hearing -chest pain -fast, irregular heartbeat -swelling of legs or ankles Side effects that usually do not require medical attention (report to your doctor or health  care professional if they continue or are bothersome): -dry mouth -facial flushing -nausea, vomiting -stomach gas, pain -tired, weak -trouble sleeping This list may not describe all possible side effects. Call your doctor for medical advice about side effects. You may report side effects to FDA at 1-800-FDA-1088. Where should I keep my medicine? Keep out of the reach of children. Store at room temperature between 59 and 86 degrees F (15 and 30 degrees C). Protect from light. Keep container tightly closed. Throw away any unused medicine after the expiration date. NOTE: This sheet is a summary. It may not cover all possible information. If you have questions about this medicine, talk to your doctor, pharmacist, or health care provider.  2018 Elsevier/Gold Standard (2012-03-04 11:40:58)

## 2017-11-23 NOTE — Progress Notes (Signed)
Cardiology Office Note:    Date:  11/23/2017   ID:  Ernest Booker, DOB 28-May-1945, MRN [id number]  PCP:  Myer Peer, MD  Cardiologist:  Jenne Campus, MD    Referring MD: Myer Peer, MD   Chief Complaint  Patient presents with  . Follow-up  Doing well  History of Present Illness:    Ernest Booker is a 72 y.o. male with coronary artery disease status post coronary artery bypass graft with occlusion of 3 grafts out of 4 repeated cardiac catheterization was done and there is no target lesion for intervention try to maximize medical therapy still got some chest pain he takes about 3-4 nitroglycerin every week.  He is already on maximum dose of phrynolysin maximum tolerated full dose of beta-blocker as well as maximum dose of Imdur I will ask him to start taking amlodipine 2.54 g daily on top of all medications.  Past Medical History:  Diagnosis Date  . Anginal pain ([place])   . Arthritis   . Coronary artery disease   . Hyperlipidemia   . Hypertension   . Myocardial infarction [facility])     Past Surgical History:  Procedure Laterality Date  . CORONARY ARTERY BYPASS GRAFT N/A 09/28/2016   Procedure: CORONARY ARTERY BYPASS GRAFTING (CABG) x four, using left internal mammary artery and right leg greater saphenous vein harvested endoscopically;  Surgeon: Ivin Poot, MD;  Location: [place];  Service: Open Heart Surgery;  Laterality: N/A;  . IR THORACENTESIS ASP PLEURAL SPACE W/IMG GUIDE  10/06/2016  . LEFT HEART CATH AND CORONARY ANGIOGRAPHY N/A 09/24/2016   Procedure: Left Heart Cath and Coronary Angiography;  Surgeon: Lorretta Harp, MD;  Location: [place] [facility];  Service: Cardiovascular;  Laterality: N/A;  . LEFT HEART CATH AND CORS/GRAFTS ANGIOGRAPHY N/A 02/11/2017   Procedure: LEFT HEART CATH AND CORS/GRAFTS ANGIOGRAPHY;  Surgeon: Jettie Booze, MD;  Location: [place] [facility];  Service: Cardiovascular;  Laterality: N/A;  . TEE WITHOUT CARDIOVERSION N/A 09/28/2016   Procedure: TRANSESOPHAGEAL ECHOCARDIOGRAM (TEE);  Surgeon: Prescott Gum, Collier Salina, MD;  Location: [place];  Service: Open Heart Surgery;  Laterality: N/A;  . ULTRASOUND GUIDANCE FOR VASCULAR ACCESS  02/11/2017   Procedure: Ultrasound Guidance For Vascular Access;  Surgeon: Jettie Booze, MD;  Location: [place] [facility];  Service: Cardiovascular;;    Current Medications: Current Meds  Medication Sig  . acetaminophen (TYLENOL) 500 MG tablet Take 500-1,000 mg by mouth every 6 (six) hours as needed for headache.  Marland Kitchen aspirin 81 MG EC tablet Take 1 tablet (81 mg total) by mouth daily.  Marland Kitchen atorvastatin (LIPITOR) 80 MG tablet Take 1 tablet (80 mg total) by mouth daily.  . isosorbide mononitrate (IMDUR) 120 MG 24 hr tablet Take 1 tablet (120 mg total) by mouth daily.  Marland Kitchen lisinopril (PRINIVIL,ZESTRIL) 2.5 MG tablet Take 1 tablet (2.5 mg total) by mouth daily.  . metoprolol tartrate (LOPRESSOR) 25 MG tablet Take 1 tablet (25 mg total) 2 (two) times daily by mouth.  . nitroGLYCERIN (NITROSTAT) 0.4 MG SL tablet Place 1 tablet (0.4 mg total) every 5 (five) minutes as needed under the tongue for chest pain.  . pantoprazole (PROTONIX) 40 MG tablet Take 40 mg by mouth 2 (two) times daily.  . ranolazine (RANEXA) 1000 MG SR tablet Take 1 tablet (1,000 mg total) by mouth 2 (two) times daily.  . rivaroxaban (XARELTO) 2.5 MG TABS tablet Take 2.5 mg by mouth 2 (two) times daily.  . vitamin B-12 (  CYANOCOBALAMIN) 1000 MCG tablet Take 1,000 mcg by mouth daily.     Allergies:   Benadryl [diphenhydramine hcl (sleep)] and Levofloxacin   Social History   Socioeconomic History  . Marital status: Married    Spouse name: Not on file  . Number of children: Not on file  . Years of education: Not on file  . Highest education level: Not on file  Occupational History  . Occupation: Retired  Scientific laboratory technician  . Financial resource strain: Not on file  . Food insecurity:    Worry: Not on file    Inability: Not on file  .  Transportation needs:    Medical: Not on file    Non-medical: Not on file  Tobacco Use  . Smoking status: Never Smoker  . Smokeless tobacco: Never Used  Substance and Sexual Activity  . Alcohol use: No  . Drug use: No  . Sexual activity: Yes    Birth control/protection: None  Lifestyle  . Physical activity:    Days per week: Not on file    Minutes per session: Not on file  . Stress: Not on file  Relationships  . Social connections:    Talks on phone: Not on file    Gets together: Not on file    Attends religious service: Not on file    Active member of club or organization: Not on file    Attends meetings of clubs or organizations: Not on file    Relationship status: Not on file  Other Topics Concern  . Not on file  Social History Narrative  . Not on file     Family History: The patient's family history includes Heart attack in his father; Heart failure in his mother. ROS:   Please see the history of present illness.    All 14 point review of systems negative except as described per history of present illness  EKGs/Labs/Other Studies Reviewed:      Recent Labs: 11/30/2016: BUN 22; Creatinine, Ser 1.36; Potassium 4.3; Sodium 139  Recent Lipid Panel    Component Value Date/Time   CHOL 96 (L) 09/14/2017 0840   TRIG 108 09/14/2017 0840   HDL 42 09/14/2017 0840   CHOLHDL 2.3 09/14/2017 0840   CHOLHDL 3.4 09/26/2016 0232   VLDL 21 09/26/2016 0232   LDLCALC 32 09/14/2017 0840    Physical Exam:    VS:  BP 120/72 (BP Location: Right Arm, Patient Position: Sitting, Cuff Size: Normal)   Pulse (!) 53   Ht 5\' 5"  (1.651 m)   Wt 200 lb (90.7 kg)   SpO2 95%   BMI 33.28 kg/m     Wt Readings from Last 3 Encounters:  11/23/17 200 lb (90.7 kg)  09/22/17 198 lb 12.8 oz (90.2 kg)  09/14/17 201 lb 12.8 oz (91.5 kg)     GEN:  Well nourished, well developed in no acute distress HEENT: Normal NECK: No JVD; No carotid bruits LYMPHATICS: No lymphadenopathy CARDIAC: RRR,  no murmurs, no rubs, no gallops RESPIRATORY:  Clear to auscultation without rales, wheezing or rhonchi  ABDOMEN: Soft, non-tender, non-distended MUSCULOSKELETAL:  No edema; No deformity  SKIN: Warm and dry LOWER EXTREMITIES: no swelling NEUROLOGIC:  Alert and oriented x 3 PSYCHIATRIC:  Normal affect   ASSESSMENT:    1. Coronary artery disease of native artery of native heart with stable angina pectoris ([place])   2. Hx of CABG   3. S/P CABG x 4   4. Mixed hyperlipidemia    PLAN:  In order of problems listed above:  1. Coronary artery disease still some angina pectoris [place] classification 2.  I will add Norvasc to his medical regimen. 2. Dyslipidemia on high intensity statin which I will continue. 3. Status post coronary artery bypass graft noted. 4.    Medication Adjustments/Labs and Tests Ordered: Current medicines are reviewed at length with the patient today.  Concerns regarding medicines are outlined above.  No orders of the defined types were placed in this encounter.  Medication changes: No orders of the defined types were placed in this encounter.   Signed, Park Liter, MD, [facility] 11/23/2017 4:58 PM    [place]

## 2017-11-23 NOTE — Addendum Note (Signed)
Addended by: Ashok Norris on: 11/23/2017 05:08 PM   Modules accepted: Orders

## 2017-12-15 DIAGNOSIS — J189 Pneumonia, unspecified organism: Secondary | ICD-10-CM | POA: Diagnosis not present

## 2017-12-15 DIAGNOSIS — R5381 Other malaise: Secondary | ICD-10-CM | POA: Diagnosis not present

## 2017-12-15 DIAGNOSIS — R05 Cough: Secondary | ICD-10-CM | POA: Diagnosis not present

## 2017-12-16 DIAGNOSIS — K219 Gastro-esophageal reflux disease without esophagitis: Secondary | ICD-10-CM | POA: Diagnosis not present

## 2017-12-28 ENCOUNTER — Encounter: Payer: Self-pay | Admitting: Cardiology

## 2017-12-28 ENCOUNTER — Ambulatory Visit (INDEPENDENT_AMBULATORY_CARE_PROVIDER_SITE_OTHER): Payer: Medicare Other | Admitting: Cardiology

## 2017-12-28 VITALS — BP 110/62 | HR 54 | Ht 65.0 in | Wt 202.4 lb

## 2017-12-28 DIAGNOSIS — I25118 Atherosclerotic heart disease of native coronary artery with other forms of angina pectoris: Secondary | ICD-10-CM

## 2017-12-28 DIAGNOSIS — Z951 Presence of aortocoronary bypass graft: Secondary | ICD-10-CM | POA: Diagnosis not present

## 2017-12-28 DIAGNOSIS — I10 Essential (primary) hypertension: Secondary | ICD-10-CM | POA: Diagnosis not present

## 2017-12-28 DIAGNOSIS — E782 Mixed hyperlipidemia: Secondary | ICD-10-CM

## 2017-12-28 MED ORDER — FUROSEMIDE 20 MG PO TABS: 20.0000 mg | ORAL_TABLET | ORAL | 3 refills | Status: DC | PRN

## 2017-12-28 MED ORDER — RIVAROXABAN 2.5 MG PO TABS: 2.5000 mg | ORAL_TABLET | Freq: Two times a day (BID) | ORAL | 3 refills | Status: DC

## 2017-12-28 MED ORDER — LISINOPRIL 2.5 MG PO TABS
2.5000 mg | ORAL_TABLET | Freq: Every day | ORAL | 3 refills | Status: DC
Start: 2017-12-28 — End: 2018-12-20

## 2017-12-28 MED ORDER — ATORVASTATIN CALCIUM 80 MG PO TABS: 80.0000 mg | ORAL_TABLET | Freq: Every day | ORAL | 3 refills | Status: DC

## 2017-12-28 NOTE — Progress Notes (Signed)
Cardiology Office Note:    Date:  12/28/2017   ID:  Ernest Booker, DOB 05/10/1945, MRN [id number]  PCP:  Myer Peer, MD  Cardiologist:  Jenne Campus, MD    Referring MD: Myer Peer, MD   Chief Complaint  Patient presents with  . 1 month follow up  I am doing better  History of Present Illness:    Ernest Booker is a 71 y.o. male with coronary artery disease status post coronary artery bypass graft shortly after bypass surgery who was discovered to occluded 3 out of 4 grafts since that time we tried to manage him medically.  He seems to be doing better he actually went fishing.  We discovered today also that he was not taking Xarelto.  It is unclear why it happened but he was not taking 2.5 mill grams Xarelto twice daily.  Therefore I will put him on this medication.  Also he complained of having minimal swelling of lower extremities which I think is related to his amlodipine I will be being seems to be helping with his chest pain therefore we will continue I will put very small dose of diuretic I gave him 20 mg of furosemide with request to take it on as-needed basis.  Past Medical History:  Diagnosis Date  . Anginal pain ([place])   . Arthritis   . Coronary artery disease   . Hyperlipidemia   . Hypertension   . Myocardial infarction [facility])     Past Surgical History:  Procedure Laterality Date  . CORONARY ARTERY BYPASS GRAFT N/A 09/28/2016   Procedure: CORONARY ARTERY BYPASS GRAFTING (CABG) x four, using left internal mammary artery and right leg greater saphenous vein harvested endoscopically;  Surgeon: Ivin Poot, MD;  Location: [place];  Service: Open Heart Surgery;  Laterality: N/A;  . IR THORACENTESIS ASP PLEURAL SPACE W/IMG GUIDE  10/06/2016  . LEFT HEART CATH AND CORONARY ANGIOGRAPHY N/A 09/24/2016   Procedure: Left Heart Cath and Coronary Angiography;  Surgeon: Lorretta Harp, MD;  Location: [place] [facility] LAB;  Service: Cardiovascular;  Laterality: N/A;  . LEFT  HEART CATH AND CORS/GRAFTS ANGIOGRAPHY N/A 02/11/2017   Procedure: LEFT HEART CATH AND CORS/GRAFTS ANGIOGRAPHY;  Surgeon: Jettie Booze, MD;  Location: [place] [facility];  Service: Cardiovascular;  Laterality: N/A;  . TEE WITHOUT CARDIOVERSION N/A 09/28/2016   Procedure: TRANSESOPHAGEAL ECHOCARDIOGRAM (TEE);  Surgeon: Prescott Gum, Collier Salina, MD;  Location: [place];  Service: Open Heart Surgery;  Laterality: N/A;  . ULTRASOUND GUIDANCE FOR VASCULAR ACCESS  02/11/2017   Procedure: Ultrasound Guidance For Vascular Access;  Surgeon: Jettie Booze, MD;  Location: [place] [facility];  Service: Cardiovascular;;    Current Medications: Current Meds  Medication Sig  . acetaminophen (TYLENOL) 500 MG tablet Take 500-1,000 mg by mouth every 6 (six) hours as needed for headache.  Marland Kitchen amLODipine (NORVASC) 2.5 MG tablet Take 1 tablet (2.5 mg total) by mouth daily.  Marland Kitchen aspirin 81 MG EC tablet Take 1 tablet (81 mg total) by mouth daily.  Marland Kitchen atorvastatin (LIPITOR) 80 MG tablet Take 1 tablet (80 mg total) by mouth daily.  . isosorbide mononitrate (IMDUR) 120 MG 24 hr tablet Take 1 tablet (120 mg total) by mouth daily.  Marland Kitchen lisinopril (PRINIVIL,ZESTRIL) 2.5 MG tablet Take 1 tablet (2.5 mg total) by mouth daily.  . metoprolol tartrate (LOPRESSOR) 25 MG tablet Take 1 tablet (25 mg total) 2 (two) times daily by mouth.  . nitroGLYCERIN (NITROSTAT) 0.4 MG SL  tablet Place 1 tablet (0.4 mg total) every 5 (five) minutes as needed under the tongue for chest pain.  . pantoprazole (PROTONIX) 40 MG tablet Take 40 mg by mouth 2 (two) times daily.  . ranolazine (RANEXA) 1000 MG SR tablet Take 1 tablet (1,000 mg total) by mouth 2 (two) times daily.  . vitamin B-12 (CYANOCOBALAMIN) 1000 MCG tablet Take 1,000 mcg by mouth daily.     Allergies:   Benadryl [diphenhydramine hcl (sleep)] and Levofloxacin   Social History   Socioeconomic History  . Marital status: Married    Spouse name: Not on file  . Number of children: Not  on file  . Years of education: Not on file  . Highest education level: Not on file  Occupational History  . Occupation: Retired  Scientific laboratory technician  . Financial resource strain: Not on file  . Food insecurity:    Worry: Not on file    Inability: Not on file  . Transportation needs:    Medical: Not on file    Non-medical: Not on file  Tobacco Use  . Smoking status: Never Smoker  . Smokeless tobacco: Never Used  Substance and Sexual Activity  . Alcohol use: No  . Drug use: No  . Sexual activity: Yes    Birth control/protection: None  Lifestyle  . Physical activity:    Days per week: Not on file    Minutes per session: Not on file  . Stress: Not on file  Relationships  . Social connections:    Talks on phone: Not on file    Gets together: Not on file    Attends religious service: Not on file    Active member of club or organization: Not on file    Attends meetings of clubs or organizations: Not on file    Relationship status: Not on file  Other Topics Concern  . Not on file  Social History Narrative  . Not on file     Family History: The patient's family history includes Heart attack in his father; Heart failure in his mother. ROS:   Please see the history of present illness.    All 14 point review of systems negative except as described per history of present illness  EKGs/Labs/Other Studies Reviewed:      Recent Labs: No results found for requested labs within last 8760 hours.  Recent Lipid Panel    Component Value Date/Time   CHOL 96 (L) 09/14/2017 0840   TRIG 108 09/14/2017 0840   HDL 42 09/14/2017 0840   CHOLHDL 2.3 09/14/2017 0840   CHOLHDL 3.4 09/26/2016 0232   VLDL 21 09/26/2016 0232   LDLCALC 32 09/14/2017 0840    Physical Exam:    VS:  BP 110/62   Pulse (!) 54   Ht 5\' 5"  (1.651 m)   Wt 202 lb 6.4 oz (91.8 kg)   SpO2 95%   BMI 33.68 kg/m     Wt Readings from Last 3 Encounters:  12/28/17 202 lb 6.4 oz (91.8 kg)  11/23/17 200 lb (90.7 kg)    09/22/17 198 lb 12.8 oz (90.2 kg)     GEN:  Well nourished, well developed in no acute distress HEENT: Normal NECK: No JVD; No carotid bruits LYMPHATICS: No lymphadenopathy CARDIAC: RRR, no murmurs, no rubs, no gallops RESPIRATORY:  Clear to auscultation without rales, wheezing or rhonchi  ABDOMEN: Soft, non-tender, non-distended MUSCULOSKELETAL:  No edema; No deformity  SKIN: Warm and dry LOWER EXTREMITIES: no swelling NEUROLOGIC:  Alert  and oriented x 3 PSYCHIATRIC:  Normal affect   ASSESSMENT:    1. Coronary artery disease of native artery of native heart with stable angina pectoris ([place])   2. Essential hypertension   3. S/P CABG x 4   4. Mixed hyperlipidemia    PLAN:    In order of problems listed above:   coronary artery disease stable from that point review.  Seems to be improving actually Essential hypertension blood pressure appears to be well controlled we will continue present management. Dyslipidemia.  He is taking high intensity statin which I will continue.  Overall Declin seems to be doing better.  See him back in my office in 3 months.  Medication Adjustments/Labs and Tests Ordered: Current medicines are reviewed at length with the patient today.  Concerns regarding medicines are outlined above.  No orders of the defined types were placed in this encounter.  Medication changes: No orders of the defined types were placed in this encounter.   Signed, Park Liter, MD, [facility] 12/28/2017 4:28 PM    [place]

## 2017-12-28 NOTE — Patient Instructions (Signed)
Medication Instructions:  Your physician has recommended you make the following change in your medication:  START: Xarelto 2.5 mg twice daily   Take as needed: Lasix 20 mg as needed for lower extremity swelling.   Labwork: None.  Testing/Procedures: None.  Follow-Up: Your physician wants you to follow-up in: 3 months. You will receive a reminder letter in the mail two months in advance. If you don't receive a letter, please call our office to schedule the follow-up appointment.   Any Other Special Instructions Will Be Listed Below (If Applicable).     If you need a refill on your cardiac medications before your next appointment, please call your pharmacy.  Rivaroxaban oral tablets What is this medicine? RIVAROXABAN (ri va ROX a ban) is an anticoagulant (blood thinner). It is used to treat blood clots in the lungs or in the veins. It is also used after knee or hip surgeries to prevent blood clots. It is also used to lower the chance of stroke in people with a medical condition called atrial fibrillation. This medicine may be used for other purposes; ask your health care provider or pharmacist if you have questions. COMMON BRAND NAME(S): Xarelto, Xarelto Starter Pack What should I tell my health care provider before I take this medicine? They need to know if you have any of these conditions: -bleeding disorders -bleeding in the brain -blood in your stools (black or tarry stools) or if you have blood in your vomit -history of stomach bleeding -kidney disease -liver disease -low blood counts, like low white cell, platelet, or red cell counts -recent or planned spinal or epidural procedure -take medicines that treat or prevent blood clots -an unusual or allergic reaction to rivaroxaban, other medicines, foods, dyes, or preservatives -pregnant or trying to get pregnant -breast-feeding How should I use this medicine? Take this medicine by mouth with a glass of water. Follow the  directions on the prescription label. Take your medicine at regular intervals. Do not take it more often than directed. Do not stop taking except on your doctor's advice. Stopping this medicine may increase your risk of a blood clot. Be sure to refill your prescription before you run out of medicine. If you are taking this medicine after hip or knee replacement surgery, take it with or without food. If you are taking this medicine for atrial fibrillation, take it with your evening meal. If you are taking this medicine to treat blood clots, take it with food at the same time each day. If you are unable to swallow your tablet, you may crush the tablet and mix it in applesauce. Then, immediately eat the applesauce. You should eat more food right after you eat the applesauce containing the crushed tablet. Talk to your pediatrician regarding the use of this medicine in children. Special care may be needed. Overdosage: If you think you have taken too much of this medicine contact a poison control center or emergency room at once. NOTE: This medicine is only for you. Do not share this medicine with others. What if I miss a dose? If you take your medicine once a day and miss a dose, take the missed dose as soon as you remember. If you take your medicine twice a day and miss a dose, take the missed dose immediately. In this instance, 2 tablets may be taken at the same time. The next day you should take 1 tablet twice a day as directed. What may interact with this medicine? Do not take this  medicine with any of the following medications: -defibrotide This medicine may also interact with the following medications: -aspirin and aspirin-like medicines -certain antibiotics like erythromycin, azithromycin, and clarithromycin -certain medicines for fungal infections like ketoconazole and itraconazole -certain medicines for irregular heart beat like amiodarone, quinidine, dronedarone -certain medicines for seizures  like carbamazepine, phenytoin -certain medicines that treat or prevent blood clots like warfarin, enoxaparin, and dalteparin -conivaptan -diltiazem -felodipine -indinavir -lopinavir; ritonavir -NSAIDS, medicines for pain and inflammation, like ibuprofen or naproxen -ranolazine -rifampin -ritonavir -SNRIs, medicines for depression, like desvenlafaxine, duloxetine, levomilnacipran, venlafaxine -SSRIs, medicines for depression, like citalopram, escitalopram, fluoxetine, fluvoxamine, paroxetine, sertraline -St. John's wort -verapamil This list may not describe all possible interactions. Give your health care provider a list of all the medicines, herbs, non-prescription drugs, or dietary supplements you use. Also tell them if you smoke, drink alcohol, or use illegal drugs. Some items may interact with your medicine. What should I watch for while using this medicine? Visit your doctor or health care professional for regular checks on your progress. Notify your doctor or health care professional and seek emergency treatment if you develop breathing problems; changes in vision; chest pain; severe, sudden headache; pain, swelling, warmth in the leg; trouble speaking; sudden numbness or weakness of the face, arm or leg. These can be signs that your condition has gotten worse. If you are going to have surgery or other procedure, tell your doctor that you are taking this medicine. What side effects may I notice from receiving this medicine? Side effects that you should report to your doctor or health care professional as soon as possible: -allergic reactions like skin rash, itching or hives, swelling of the face, lips, or tongue -back pain -redness, blistering, peeling or loosening of the skin, including inside the mouth -signs and symptoms of bleeding such as bloody or black, tarry stools; red or dark-brown urine; spitting up blood or brown material that looks like coffee grounds; red spots on the  skin; unusual bruising or bleeding from the eye, gums, or nose Side effects that usually do not require medical attention (report to your doctor or health care professional if they continue or are bothersome): -dizziness -muscle pain This list may not describe all possible side effects. Call your doctor for medical advice about side effects. You may report side effects to FDA at 1-800-FDA-1088. Where should I keep my medicine? Keep out of the reach of children. Store at room temperature between 15 and 30 degrees C (59 and 86 degrees F). Throw away any unused medicine after the expiration date. NOTE: This sheet is a summary. It may not cover all possible information. If you have questions about this medicine, talk to your doctor, pharmacist, or health care provider.  2018 Elsevier/Gold Standard (2015-12-25 16:29:33)  Furosemide tablets What is this medicine? FUROSEMIDE (fyoor [place] se mide) is a diuretic. It helps you make more urine and to lose salt and excess water from your body. This medicine is used to treat high blood pressure, and edema or swelling from heart, kidney, or liver disease. This medicine may be used for other purposes; ask your health care provider or pharmacist if you have questions. COMMON BRAND NAME(S): Active-Medicated Specimen Kit, Delone, Diuscreen, Lasix, RX Specimen Collection Kit, Specimen Collection Kit, URINX Medicated Specimen Collection What should I tell my health care provider before I take this medicine? They need to know if you have any of these conditions: -abnormal blood electrolytes -diarrhea or vomiting -gout -heart disease -kidney disease, small amounts  of urine, or difficulty passing urine -liver disease -thyroid disease -an unusual or allergic reaction to furosemide, sulfa drugs, other medicines, foods, dyes, or preservatives -pregnant or trying to get pregnant -breast-feeding How should I use this medicine? Take this medicine by mouth with a glass  of water. Follow the directions on the prescription label. You may take this medicine with or without food. If it upsets your stomach, take it with food or milk. Do not take your medicine more often than directed. Remember that you will need to pass more urine after taking this medicine. Do not take your medicine at a time of day that will cause you problems. Do not take at bedtime. Talk to your pediatrician regarding the use of this medicine in children. While this drug may be prescribed for selected conditions, precautions do apply. Overdosage: If you think you have taken too much of this medicine contact a poison control center or emergency room at once. NOTE: This medicine is only for you. Do not share this medicine with others. What if I miss a dose? If you miss a dose, take it as soon as you can. If it is almost time for your next dose, take only that dose. Do not take double or extra doses. What may interact with this medicine? -aspirin and aspirin-like medicines -certain antibiotics -chloral hydrate -cisplatin -cyclosporine -digoxin -diuretics -laxatives -lithium -medicines for blood pressure -medicines that relax muscles for surgery -methotrexate -NSAIDs, medicines for pain and inflammation like ibuprofen, naproxen, or indomethacin -phenytoin -steroid medicines like prednisone or cortisone -sucralfate -thyroid hormones This list may not describe all possible interactions. Give your health care provider a list of all the medicines, herbs, non-prescription drugs, or dietary supplements you use. Also tell them if you smoke, drink alcohol, or use illegal drugs. Some items may interact with your medicine. What should I watch for while using this medicine? Visit your doctor or health care professional for regular checks on your progress. Check your blood pressure regularly. Ask your doctor or health care professional what your blood pressure should be, and when you should contact him or  her. If you are a diabetic, check your blood sugar as directed. You may need to be on a special diet while taking this medicine. Check with your doctor. Also, ask how many glasses of fluid you need to drink a day. You must not get dehydrated. You may get drowsy or dizzy. Do not drive, use machinery, or do anything that needs mental alertness until you know how this drug affects you. Do not stand or sit up quickly, especially if you are an older patient. This reduces the risk of dizzy or fainting spells. Alcohol can make you more drowsy and dizzy. Avoid alcoholic drinks. This medicine can make you more sensitive to the sun. Keep out of the sun. If you cannot avoid being in the sun, wear protective clothing and use sunscreen. Do not use sun lamps or tanning beds/booths. What side effects may I notice from receiving this medicine? Side effects that you should report to your doctor or health care professional as soon as possible: -blood in urine or stools -dry mouth -fever or chills -hearing loss or ringing in the ears -irregular heartbeat -muscle pain or weakness, cramps -skin rash -stomach upset, pain, or nausea -tingling or numbness in the hands or feet -unusually weak or tired -vomiting or diarrhea -yellowing of the eyes or skin Side effects that usually do not require medical attention (report to your doctor or health  care professional if they continue or are bothersome): -headache -loss of appetite -unusual bleeding or bruising This list may not describe all possible side effects. Call your doctor for medical advice about side effects. You may report side effects to FDA at 1-800-FDA-1088. Where should I keep my medicine? Keep out of the reach of children. Store at room temperature between 15 and 30 degrees C (59 and 86 degrees F). Protect from light. Throw away any unused medicine after the expiration date. NOTE: This sheet is a summary. It may not cover all possible information. If you  have questions about this medicine, talk to your doctor, pharmacist, or health care provider.  2018 Elsevier/Gold Standard (2014-06-27 13:49:50)

## 2018-01-05 ENCOUNTER — Telehealth: Payer: Self-pay | Admitting: *Deleted

## 2018-01-05 NOTE — Telephone Encounter (Signed)
Left voicemail informing patient of the xarelto patient assistance number. Requested that patient please keep [place] updated.

## 2018-01-05 NOTE — Telephone Encounter (Signed)
Pt unable to afford Xarelto. Please advise

## 2018-01-06 ENCOUNTER — Telehealth: Payer: Self-pay | Admitting: Cardiology

## 2018-01-06 NOTE — Telephone Encounter (Signed)
Patient was put on Zarelto but having issues with medication and so expensive. Is there an alternative?

## 2018-01-07 NOTE — Telephone Encounter (Signed)
Informed the wife that xarelto with medicare should be $7 monthly, if the patient is in the doughnut hole we would provide samples in the mean time. Wife is going to call xarelto patient assistance to ensure that this is the issue and call the office back.

## 2018-01-07 NOTE — Telephone Encounter (Signed)
Called patient went straight to full voicemail box. Will call the patient again later.

## 2018-01-10 NOTE — Telephone Encounter (Signed)
Patient is going to call and verify the price with the pharmacy. Patient's wife will notify us and let us know if they need samples until the patient assistance paperwork is needed.

## 2018-01-10 NOTE — Telephone Encounter (Signed)
See corresponding call

## 2018-01-10 NOTE — Telephone Encounter (Signed)
Patient's wife reports pharmacy price is still too high because the patient is in the donut hole. Patient's wife is working on patient assistance paperwork and per Dr. Agustin Cree we will provide samples of xarelto 2.5 mg. Patient's wife will pick samples up in [place] office tomorrow.

## 2018-01-10 NOTE — Telephone Encounter (Signed)
Patient reports being off Xarelto 2.5 mg bid since last Thursday 01/06/18 due to running out of samples and prescription is too expensive. Patient has reached out to patient assistance and is working on the paper work now. Will consult with Dr. Agustin Cree.

## 2018-01-22 DIAGNOSIS — I25118 Atherosclerotic heart disease of native coronary artery with other forms of angina pectoris: Secondary | ICD-10-CM | POA: Diagnosis not present

## 2018-01-22 DIAGNOSIS — E785 Hyperlipidemia, unspecified: Secondary | ICD-10-CM | POA: Diagnosis not present

## 2018-01-22 DIAGNOSIS — Z982 Presence of cerebrospinal fluid drainage device: Secondary | ICD-10-CM | POA: Diagnosis not present

## 2018-01-22 DIAGNOSIS — F329 Major depressive disorder, single episode, unspecified: Secondary | ICD-10-CM | POA: Diagnosis not present

## 2018-01-22 DIAGNOSIS — Z955 Presence of coronary angioplasty implant and graft: Secondary | ICD-10-CM | POA: Diagnosis not present

## 2018-01-22 DIAGNOSIS — R208 Other disturbances of skin sensation: Secondary | ICD-10-CM | POA: Diagnosis not present

## 2018-01-22 DIAGNOSIS — R079 Chest pain, unspecified: Secondary | ICD-10-CM | POA: Diagnosis not present

## 2018-01-22 DIAGNOSIS — Z23 Encounter for immunization: Secondary | ICD-10-CM | POA: Diagnosis not present

## 2018-01-22 DIAGNOSIS — R072 Precordial pain: Secondary | ICD-10-CM | POA: Diagnosis not present

## 2018-01-22 DIAGNOSIS — I252 Old myocardial infarction: Secondary | ICD-10-CM | POA: Diagnosis not present

## 2018-01-22 DIAGNOSIS — K219 Gastro-esophageal reflux disease without esophagitis: Secondary | ICD-10-CM | POA: Diagnosis not present

## 2018-01-22 DIAGNOSIS — M1990 Unspecified osteoarthritis, unspecified site: Secondary | ICD-10-CM | POA: Diagnosis not present

## 2018-01-22 DIAGNOSIS — Z888 Allergy status to other drugs, medicaments and biological substances status: Secondary | ICD-10-CM | POA: Diagnosis not present

## 2018-01-22 DIAGNOSIS — Z79899 Other long term (current) drug therapy: Secondary | ICD-10-CM | POA: Diagnosis not present

## 2018-01-22 DIAGNOSIS — Z881 Allergy status to other antibiotic agents status: Secondary | ICD-10-CM | POA: Diagnosis not present

## 2018-01-22 DIAGNOSIS — I10 Essential (primary) hypertension: Secondary | ICD-10-CM

## 2018-01-22 DIAGNOSIS — I25810 Atherosclerosis of coronary artery bypass graft(s) without angina pectoris: Secondary | ICD-10-CM | POA: Diagnosis not present

## 2018-01-22 DIAGNOSIS — E875 Hyperkalemia: Secondary | ICD-10-CM | POA: Diagnosis not present

## 2018-01-22 DIAGNOSIS — Z951 Presence of aortocoronary bypass graft: Secondary | ICD-10-CM | POA: Diagnosis not present

## 2018-01-23 DIAGNOSIS — R079 Chest pain, unspecified: Secondary | ICD-10-CM | POA: Diagnosis not present

## 2018-01-23 DIAGNOSIS — E875 Hyperkalemia: Secondary | ICD-10-CM | POA: Diagnosis not present

## 2018-01-23 DIAGNOSIS — I10 Essential (primary) hypertension: Secondary | ICD-10-CM | POA: Diagnosis not present

## 2018-01-23 DIAGNOSIS — I25810 Atherosclerosis of coronary artery bypass graft(s) without angina pectoris: Secondary | ICD-10-CM | POA: Diagnosis not present

## 2018-01-23 DIAGNOSIS — R208 Other disturbances of skin sensation: Secondary | ICD-10-CM | POA: Diagnosis not present

## 2018-01-31 ENCOUNTER — Telehealth: Payer: Self-pay | Admitting: Cardiology

## 2018-01-31 MED ORDER — METOPROLOL TARTRATE 25 MG PO TABS: 25.0000 mg | ORAL_TABLET | Freq: Two times a day (BID) | ORAL | 1 refills | Status: DC

## 2018-01-31 NOTE — Telephone Encounter (Addendum)
Metoprolol 25 mg twice daily refilled

## 2018-01-31 NOTE — Telephone Encounter (Signed)
Call [facility] on [address]

## 2018-01-31 NOTE — Addendum Note (Signed)
Addended by: Linna Hoff R on: 01/31/2018 10:30 AM   Modules accepted: Orders

## 2018-02-01 ENCOUNTER — Telehealth: Payer: Self-pay | Admitting: Cardiology

## 2018-02-01 MED ORDER — METOPROLOL TARTRATE 25 MG PO TABS: 25.0000 mg | ORAL_TABLET | Freq: Two times a day (BID) | ORAL | 1 refills | Status: DC

## 2018-02-01 NOTE — Addendum Note (Signed)
Addended by: Aleatha Borer on: 02/01/2018 04:44 PM   Modules accepted: Orders

## 2018-02-01 NOTE — Telephone Encounter (Signed)
[facility] [address] [facility]

## 2018-02-01 NOTE — Telephone Encounter (Signed)
Sent in

## 2018-02-11 ENCOUNTER — Telehealth: Payer: Self-pay | Admitting: Cardiology

## 2018-02-11 NOTE — Telephone Encounter (Signed)
Patient assistance forms signed and  faxed to [employer] [employer], wife will pick up.

## 2018-02-11 NOTE — Telephone Encounter (Signed)
Patient called wanting to know if Dr Raliegh Ip could put him on something other than XARELTO because of the cost. (There portion is over $300 for 90 day supply). They have requested financial assistance twice with XARELTO but they have yet to receive the forms they are suppose to complete for the assistace

## 2018-02-11 NOTE — Telephone Encounter (Signed)
Informed patient to call me if ICD code isn't filled out on paperwork. Patient verbally understands, otherwise he will fill his part out and mail it in.

## 2018-02-11 NOTE — Telephone Encounter (Signed)
Spoke with patient, informed him that I will get forms and fill them out and have them faxed to [facility] today and he will pick up patient verbally understands.

## 2018-02-28 ENCOUNTER — Other Ambulatory Visit: Payer: Self-pay | Admitting: Emergency Medicine

## 2018-02-28 MED ORDER — RANOLAZINE ER 1000 MG PO TB12: 1000.0000 mg | ORAL_TABLET | Freq: Two times a day (BID) | ORAL | 1 refills | Status: DC

## 2018-02-28 NOTE — Telephone Encounter (Signed)
Per Dr. Agustin Cree Ranolazine 1000 mg twice daily refilled at [facility]

## 2018-03-22 ENCOUNTER — Other Ambulatory Visit: Payer: Self-pay | Admitting: Cardiology

## 2018-03-29 ENCOUNTER — Ambulatory Visit (INDEPENDENT_AMBULATORY_CARE_PROVIDER_SITE_OTHER): Payer: Medicare Other | Admitting: Cardiology

## 2018-03-29 ENCOUNTER — Encounter: Payer: Self-pay | Admitting: Cardiology

## 2018-03-29 VITALS — BP 124/64 | HR 63 | Ht 65.0 in | Wt 204.8 lb

## 2018-03-29 DIAGNOSIS — I255 Ischemic cardiomyopathy: Secondary | ICD-10-CM | POA: Diagnosis not present

## 2018-03-29 DIAGNOSIS — E782 Mixed hyperlipidemia: Secondary | ICD-10-CM

## 2018-03-29 DIAGNOSIS — I25118 Atherosclerotic heart disease of native coronary artery with other forms of angina pectoris: Secondary | ICD-10-CM

## 2018-03-29 DIAGNOSIS — Z951 Presence of aortocoronary bypass graft: Secondary | ICD-10-CM | POA: Diagnosis not present

## 2018-03-29 MED ORDER — NITROGLYCERIN 0.4 % RE OINT: TOPICAL_OINTMENT | RECTAL | 5 refills | Status: DC

## 2018-03-29 NOTE — Progress Notes (Signed)
Cardiology Office Note:    Date:  03/29/2018   ID:  Ernest [facility], DOB [id number], MRN [id number]  PCP:  Ernest Peer, MD  Cardiologist:  Ernest Campus, MD    Referring MD: Ernest Peer, MD   Chief Complaint  Patient presents with  . Follow-up  I am tired and exhausted  History of Present Illness:    Ernest Booker is a 72 y.o. male with advanced coronary artery disease.  He did have coronary artery bypass graft but shortly after he was find to have occluded all grafts except one.  He is on maximal medical therapy.  We will continue with that.  Comes today to office complaining of being weak tired and exhausted.  Also short of breath I will ask him to have echocardiogram to assess left ventricular ejection fraction.  Interestingly in October he ended up going to the hospital because of chest pain he was put on nitroglycerin IV and after that for about a week he felt great.  Denies having any dizziness or passing out  Past Medical History:  Diagnosis Date  . Anginal pain ([place])   . Arthritis   . Coronary artery disease   . Hyperlipidemia   . Hypertension   . Myocardial infarction [facility])     Past Surgical History:  Procedure Laterality Date  . CORONARY ARTERY BYPASS GRAFT N/A 09/28/2016   Procedure: CORONARY ARTERY BYPASS GRAFTING (CABG) x four, using left internal mammary artery and right leg greater saphenous vein harvested endoscopically;  Surgeon: Ernest Poot, MD;  Location: [place];  Service: Open Heart Surgery;  Laterality: N/A;  . IR THORACENTESIS ASP PLEURAL SPACE W/IMG GUIDE  10/06/2016  . LEFT HEART CATH AND CORONARY ANGIOGRAPHY N/A 09/24/2016   Procedure: Left Heart Cath and Coronary Angiography;  Surgeon: Ernest Harp, MD;  Location: [place] [facility];  Service: Cardiovascular;  Laterality: N/A;  . LEFT HEART CATH AND CORS/GRAFTS ANGIOGRAPHY N/A 02/11/2017   Procedure: LEFT HEART CATH AND CORS/GRAFTS ANGIOGRAPHY;  Surgeon: Ernest Booze, MD;  Location: [place] [facility];  Service: Cardiovascular;  Laterality: N/A;  . TEE WITHOUT CARDIOVERSION N/A 09/28/2016   Procedure: TRANSESOPHAGEAL ECHOCARDIOGRAM (TEE);  Surgeon: Ernest [place], Ernest Salina, MD;  Location: [place];  Service: Open Heart Surgery;  Laterality: N/A;  . ULTRASOUND GUIDANCE FOR VASCULAR ACCESS  02/11/2017   Procedure: Ultrasound Guidance For Vascular Access;  Surgeon: Ernest Booze, MD;  Location: [place] [facility];  Service: Cardiovascular;;    Current Medications: Current Meds  Medication Sig  . amLODipine (NORVASC) 2.5 MG tablet Take 1 tablet (2.5 mg total) by mouth daily.  Marland Kitchen aspirin 81 MG EC tablet Take 1 tablet (81 mg total) by mouth daily.  Marland Kitchen atorvastatin (LIPITOR) 80 MG tablet Take 1 tablet (80 mg total) by mouth daily.  . furosemide (LASIX) 20 MG tablet TAKE 1 TABLET BY MOUTH AS NEEDED FOR LOWER EXTREMITY SWELLING.  . isosorbide mononitrate (IMDUR) 120 MG 24 hr tablet Take 1 tablet (120 mg total) by mouth daily.  Marland Kitchen lisinopril (PRINIVIL,ZESTRIL) 2.5 MG tablet Take 1 tablet (2.5 mg total) by mouth daily.  . metoprolol tartrate (LOPRESSOR) 25 MG tablet Take 1 tablet (25 mg total) by mouth 2 (two) times daily.  . nitroGLYCERIN (NITROSTAT) 0.4 MG SL tablet Place 1 tablet (0.4 mg total) every 5 (five) minutes as needed under the tongue for chest pain.  . pantoprazole (PROTONIX) 40 MG tablet Take 40 mg by mouth 2 (two) times daily.  Marland Kitchen  ranolazine (RANEXA) 1000 MG SR tablet Take 1 tablet (1,000 mg total) by mouth 2 (two) times daily.  . rivaroxaban (XARELTO) 2.5 MG TABS tablet Take 1 tablet (2.5 mg total) by mouth 2 (two) times daily.  . vitamin B-12 (CYANOCOBALAMIN) 1000 MCG tablet Take 1,000 mcg by mouth daily.     Allergies:   Benadryl [diphenhydramine hcl (sleep)] and Levofloxacin   Social History   Socioeconomic History  . Marital status: Married    Spouse name: Not on file  . Number of children: Not on file  . Years of education: Not on file  . Highest education level:  Not on file  Occupational History  . Occupation: Retired  Scientific laboratory technician  . Financial resource strain: Not on file  . Food insecurity:    Worry: Not on file    Inability: Not on file  . Transportation needs:    Medical: Not on file    Non-medical: Not on file  Tobacco Use  . Smoking status: Never Smoker  . Smokeless tobacco: Never Used  Substance and Sexual Activity  . Alcohol use: No  . Drug use: No  . Sexual activity: Yes    Birth control/protection: None  Lifestyle  . Physical activity:    Days per week: Not on file    Minutes per session: Not on file  . Stress: Not on file  Relationships  . Social connections:    Talks on phone: Not on file    Gets together: Not on file    Attends religious service: Not on file    Active member of club or organization: Not on file    Attends meetings of clubs or organizations: Not on file    Relationship status: Not on file  Other Topics Concern  . Not on file  Social History Narrative  . Not on file     Family History: The patient's family history includes Heart attack in his father; Heart failure in his mother. ROS:   Please see the history of present illness.    All 14 point review of systems negative except as described per history of present illness  EKGs/Labs/Other Studies Reviewed:      Recent Labs: No results found for requested labs within last 8760 hours.  Recent Lipid Panel    Component Value Date/Time   CHOL 96 (L) 09/14/2017 0840   TRIG 108 09/14/2017 0840   HDL 42 09/14/2017 0840   CHOLHDL 2.3 09/14/2017 0840   CHOLHDL 3.4 09/26/2016 0232   VLDL 21 09/26/2016 0232   LDLCALC 32 09/14/2017 0840    Physical Exam:    VS:  BP 124/64   Pulse 63   Ht 5\' 5"  (1.651 m)   Wt 204 lb 12.8 oz (92.9 kg)   SpO2 98%   BMI 34.08 kg/m     Wt Readings from Last 3 Encounters:  03/29/18 204 lb 12.8 oz (92.9 kg)  12/28/17 202 lb 6.4 oz (91.8 kg)  11/23/17 200 lb (90.7 kg)     GEN:  Well nourished, well developed  in no acute distress HEENT: Normal NECK: No JVD; No carotid bruits LYMPHATICS: No lymphadenopathy CARDIAC: RRR, no murmurs, no rubs, no gallops RESPIRATORY:  Clear to auscultation without rales, wheezing or rhonchi  ABDOMEN: Soft, non-tender, non-distended MUSCULOSKELETAL:  No edema; No deformity  SKIN: Warm and dry LOWER EXTREMITIES: no swelling NEUROLOGIC:  Alert and oriented x 3 PSYCHIATRIC:  Normal affect   ASSESSMENT:    1. Coronary artery disease of  native artery of native heart with stable angina pectoris ([place])   2. Ischemic cardiomyopathy   3. S/P CABG x 4   4. Mixed hyperlipidemia    PLAN:    In order of problems listed above:  1. Coronary artery disease advanced with failed 3 out of 4 grafts.  I will add some nitroglycerin patch to his medical regiment will continue with Imdur as well. 2. Ischemic cardia myopathy echocardiogram will be done to assess left ventricular ejection fraction. 3. Status post coronary artery bypass graft.  3 out of 4 grafts failed quickly after bypass surgery 4. Mixed dyslipidemia continue with statin   Medication Adjustments/Labs and Tests Ordered: Current medicines are reviewed at length with the patient today.  Concerns regarding medicines are outlined above.  No orders of the defined types were placed in this encounter.  Medication changes: No orders of the defined types were placed in this encounter.   Signed, Park Liter, MD, [facility] 03/29/2018 4:27 PM    [place]

## 2018-03-29 NOTE — Patient Instructions (Addendum)
Medication Instructions:  Your physician has recommended you make the following change in your medication:  Your Nitroglycerin will change to an ointment. You will rub 1/2 inch into your skin and leave on for 18 hours if you have chest pain  If you need a refill on your cardiac medications before your next appointment, please call your pharmacy.   Lab work: None ordered If you have labs (blood work) drawn today and your tests are completely normal, you will receive your results only by: Marland Kitchen MyChart Message (if you have MyChart) OR . A paper copy in the mail If you have any lab test that is abnormal or we need to change your treatment, we will call you to review the results.  Testing/Procedures: Your physician has requested that you have an echocardiogram. Echocardiography is a painless test that uses sound waves to create images of your heart. It provides your doctor with information about the size and shape of your heart and how well your heart's chambers and valves are working. This procedure takes approximately one hour. There are no restrictions for this procedure.    Follow-Up: At [facility], you and your health needs are our priority.  As part of our continuing mission to provide you with exceptional heart care, we have created designated Provider Care Teams.  These Care Teams include your primary Cardiologist (physician) and Advanced Practice Providers (APPs -  Physician Assistants and Nurse Practitioners) who all work together to provide you with the care you need, when you need it. You will need a follow up appointment in 1 months.  You may see Jenne Campus or another member of our [employer] Provider Team in [place]: Shirlee More, MD . Jyl Heinz, MD  Any Other Special Instructions Will Be Listed Below (If Applicable).

## 2018-03-30 ENCOUNTER — Telehealth: Payer: Self-pay | Admitting: Cardiology

## 2018-03-30 NOTE — Telephone Encounter (Signed)
Has questions about nitro being too expensive

## 2018-03-30 NOTE — Telephone Encounter (Signed)
Will call pharmacy to look into this

## 2018-03-31 MED ORDER — NITROGLYCERIN 0.1 MG/HR TD PT24: 0.1000 mg | MEDICATED_PATCH | Freq: Every day | TRANSDERMAL | 6 refills | Status: DC

## 2018-03-31 NOTE — Telephone Encounter (Signed)
Changed nitro ointment to nitro paste because of insurance issues per Dr. Agustin Cree. Faxed this prescription today due to escribe being down. Patient's wife informed. Also advised patient's wife to inform us of any other concerns that may arise. She verbally understands

## 2018-03-31 NOTE — Addendum Note (Signed)
Addended by: Ashok Norris on: 03/31/2018 10:31 AM   Modules accepted: Orders

## 2018-04-07 ENCOUNTER — Telehealth: Payer: Self-pay | Admitting: Cardiology

## 2018-04-07 NOTE — Telephone Encounter (Signed)
Patient wife called regardinng directions on the Nitro pills and dosage, Please call her back.

## 2018-04-07 NOTE — Telephone Encounter (Signed)
Patient wanting clarification on nitro pills and nitro patch. Patient advised NOT to take nitro pills a patch together, only the patch if he needs it. He verbally understands

## 2018-04-08 MED ORDER — NITROGLYCERIN 0.4 MG SL SUBL: 0.4000 mg | SUBLINGUAL_TABLET | SUBLINGUAL | 11 refills | Status: DC | PRN

## 2018-04-08 NOTE — Addendum Note (Signed)
Addended by: Ashok Norris on: 04/08/2018 02:27 PM   Modules accepted: Orders

## 2018-04-08 NOTE — Telephone Encounter (Signed)
Patient's wife calling back to confirm how he is to take nitro. Patient's wife advised per Dr. Agustin Cree to use nitro patch daily, and if needed he can use nitro sublingual pills but only as needed for chest pain. She verbally understands and will inform patient.

## 2018-04-23 DIAGNOSIS — I10 Essential (primary) hypertension: Secondary | ICD-10-CM

## 2018-04-23 DIAGNOSIS — K219 Gastro-esophageal reflux disease without esophagitis: Secondary | ICD-10-CM | POA: Diagnosis not present

## 2018-04-23 DIAGNOSIS — R072 Precordial pain: Secondary | ICD-10-CM | POA: Diagnosis not present

## 2018-04-23 DIAGNOSIS — N179 Acute kidney failure, unspecified: Secondary | ICD-10-CM | POA: Diagnosis not present

## 2018-04-23 DIAGNOSIS — E785 Hyperlipidemia, unspecified: Secondary | ICD-10-CM | POA: Diagnosis not present

## 2018-04-23 DIAGNOSIS — R079 Chest pain, unspecified: Secondary | ICD-10-CM | POA: Diagnosis not present

## 2018-04-24 DIAGNOSIS — R079 Chest pain, unspecified: Secondary | ICD-10-CM | POA: Diagnosis not present

## 2018-04-24 DIAGNOSIS — E785 Hyperlipidemia, unspecified: Secondary | ICD-10-CM | POA: Diagnosis not present

## 2018-04-24 DIAGNOSIS — N179 Acute kidney failure, unspecified: Secondary | ICD-10-CM | POA: Diagnosis not present

## 2018-04-24 DIAGNOSIS — K219 Gastro-esophageal reflux disease without esophagitis: Secondary | ICD-10-CM | POA: Diagnosis not present

## 2018-04-24 DIAGNOSIS — I361 Nonrheumatic tricuspid (valve) insufficiency: Secondary | ICD-10-CM | POA: Diagnosis not present

## 2018-04-24 DIAGNOSIS — I10 Essential (primary) hypertension: Secondary | ICD-10-CM | POA: Diagnosis not present

## 2018-04-25 ENCOUNTER — Encounter: Payer: Self-pay | Admitting: Cardiology

## 2018-04-25 DIAGNOSIS — Z7982 Long term (current) use of aspirin: Secondary | ICD-10-CM | POA: Diagnosis not present

## 2018-04-25 DIAGNOSIS — R911 Solitary pulmonary nodule: Secondary | ICD-10-CM | POA: Diagnosis not present

## 2018-04-25 DIAGNOSIS — Z951 Presence of aortocoronary bypass graft: Secondary | ICD-10-CM | POA: Diagnosis not present

## 2018-04-25 DIAGNOSIS — K219 Gastro-esophageal reflux disease without esophagitis: Secondary | ICD-10-CM | POA: Diagnosis not present

## 2018-04-25 DIAGNOSIS — R079 Chest pain, unspecified: Secondary | ICD-10-CM | POA: Diagnosis not present

## 2018-04-25 DIAGNOSIS — R918 Other nonspecific abnormal finding of lung field: Secondary | ICD-10-CM | POA: Diagnosis present

## 2018-04-25 DIAGNOSIS — I10 Essential (primary) hypertension: Secondary | ICD-10-CM | POA: Diagnosis not present

## 2018-04-25 DIAGNOSIS — Z7901 Long term (current) use of anticoagulants: Secondary | ICD-10-CM | POA: Diagnosis not present

## 2018-04-25 DIAGNOSIS — E785 Hyperlipidemia, unspecified: Secondary | ICD-10-CM | POA: Diagnosis not present

## 2018-04-25 DIAGNOSIS — I2510 Atherosclerotic heart disease of native coronary artery without angina pectoris: Secondary | ICD-10-CM | POA: Diagnosis not present

## 2018-04-25 DIAGNOSIS — N179 Acute kidney failure, unspecified: Secondary | ICD-10-CM | POA: Diagnosis not present

## 2018-04-25 DIAGNOSIS — Z79899 Other long term (current) drug therapy: Secondary | ICD-10-CM | POA: Diagnosis not present

## 2018-04-26 ENCOUNTER — Encounter: Payer: Self-pay | Admitting: Cardiology

## 2018-04-26 ENCOUNTER — Ambulatory Visit (INDEPENDENT_AMBULATORY_CARE_PROVIDER_SITE_OTHER): Payer: Medicare Other | Admitting: Cardiology

## 2018-04-26 VITALS — BP 110/60 | HR 53 | Ht 65.0 in | Wt 206.6 lb

## 2018-04-26 DIAGNOSIS — E782 Mixed hyperlipidemia: Secondary | ICD-10-CM

## 2018-04-26 DIAGNOSIS — R079 Chest pain, unspecified: Secondary | ICD-10-CM

## 2018-04-26 DIAGNOSIS — I10 Essential (primary) hypertension: Secondary | ICD-10-CM

## 2018-04-26 DIAGNOSIS — Z951 Presence of aortocoronary bypass graft: Secondary | ICD-10-CM

## 2018-04-26 DIAGNOSIS — I25118 Atherosclerotic heart disease of native coronary artery with other forms of angina pectoris: Secondary | ICD-10-CM

## 2018-04-26 NOTE — Progress Notes (Signed)
Cardiology Office Note:    Date:  04/26/2018   ID:  Ernest Booker, DOB [birth year], MRN [id number]  PCP:  Myer Peer, MD  Cardiologist:  Jenne Campus, MD    Referring MD: Myer Peer, MD   Chief Complaint  Patient presents with  . Hospitalization Follow-up  I was in the hospital  History of Present Illness:    Ernest Booker is a 73 y.o. male with advanced coronary disease status post coronary artery bypass graft however shortly after that he was find to have 3 out of 4 grafts completely occluded.  Recently he ended up going to the hospital because of chest pain.  It was relieved by nitroglycerin but required 3 tablets he rule out for myocardial infarction some of the medication has been increased which include Imdur went from 120 mg to 180 also metoprolol from 25 twice daily to 37.5 twice daily.  He comes here to discuss his problems.  With talking length about this this is a new scenario I think there is some value on trying to localize where the problem is therefore I scheduled him to have exercise Cardiolite.  I want him to continue all his medications when he comes for stress test.  The purpose of the stress test is trying to see effectiveness of the treatment if stress test will be a grossly abnormal in the area of ischemia will be in area that is not supplied by grafts that were occluded he may require repeated cardiac catheterization  Past Medical History:  Diagnosis Date  . Anginal pain ([place])   . Arthritis   . Coronary artery disease   . Hyperlipidemia   . Hypertension   . Myocardial infarction [facility])     Past Surgical History:  Procedure Laterality Date  . CORONARY ARTERY BYPASS GRAFT N/A 09/28/2016   Procedure: CORONARY ARTERY BYPASS GRAFTING (CABG) x four, using left internal mammary artery and right leg greater saphenous vein harvested endoscopically;  Surgeon: Ivin Poot, MD;  Location: [place];  Service: Open Heart Surgery;  Laterality: N/A;  . IR THORACENTESIS  ASP PLEURAL SPACE W/IMG GUIDE  10/06/2016  . LEFT HEART CATH AND CORONARY ANGIOGRAPHY N/A 09/24/2016   Procedure: Left Heart Cath and Coronary Angiography;  Surgeon: Lorretta Harp, MD;  Location: [place] CV LAB;  Service: Cardiovascular;  Laterality: N/A;  . LEFT HEART CATH AND CORS/GRAFTS ANGIOGRAPHY N/A 02/11/2017   Procedure: LEFT HEART CATH AND CORS/GRAFTS ANGIOGRAPHY;  Surgeon: Jettie Booze, MD;  Location: [place] CV LAB;  Service: Cardiovascular;  Laterality: N/A;  . TEE WITHOUT CARDIOVERSION N/A 09/28/2016   Procedure: TRANSESOPHAGEAL ECHOCARDIOGRAM (TEE);  Surgeon: Prescott Gum, Collier Salina, MD;  Location: [place];  Service: Open Heart Surgery;  Laterality: N/A;  . ULTRASOUND GUIDANCE FOR VASCULAR ACCESS  02/11/2017   Procedure: Ultrasound Guidance For Vascular Access;  Surgeon: Jettie Booze, MD;  Location: [place] CV LAB;  Service: Cardiovascular;;    Current Medications: Current Meds  Medication Sig  . amLODipine (NORVASC) 2.5 MG tablet Take 1 tablet (2.5 mg total) by mouth daily.  Marland Kitchen aspirin 81 MG EC tablet Take 1 tablet (81 mg total) by mouth daily.  Marland Kitchen atorvastatin (LIPITOR) 80 MG tablet Take 1 tablet (80 mg total) by mouth daily.  . furosemide (LASIX) 20 MG tablet TAKE 1 TABLET BY MOUTH AS NEEDED FOR LOWER EXTREMITY SWELLING.  . isosorbide mononitrate (IMDUR) 120 MG 24 hr tablet Take 1 tablet (120 mg total) by mouth daily.  Marland Kitchen  lisinopril (PRINIVIL,ZESTRIL) 2.5 MG tablet Take 1 tablet (2.5 mg total) by mouth daily.  . metoprolol tartrate (LOPRESSOR) 25 MG tablet Take 1 tablet (25 mg total) by mouth 2 (two) times daily.  . nitroGLYCERIN (NITRO-DUR) 0.1 mg/hr patch Place 1 patch (0.1 mg total) onto the skin daily.  . nitroGLYCERIN (NITROSTAT) 0.4 MG SL tablet Place 1 tablet (0.4 mg total) under the tongue every 5 (five) minutes as needed for chest pain.  . pantoprazole (PROTONIX) 40 MG tablet Take 40 mg by mouth 2 (two) times daily.  . ranolazine (RANEXA) 1000 MG SR  tablet Take 1 tablet (1,000 mg total) by mouth 2 (two) times daily.  . rivaroxaban (XARELTO) 2.5 MG TABS tablet Take 1 tablet (2.5 mg total) by mouth 2 (two) times daily.  . vitamin B-12 (CYANOCOBALAMIN) 1000 MCG tablet Take 1,000 mcg by mouth daily.     Allergies:   Benadryl [diphenhydramine hcl (sleep)] and Levofloxacin   Social History   Socioeconomic History  . Marital status: Married    Spouse name: Not on file  . Number of children: Not on file  . Years of education: Not on file  . Highest education level: Not on file  Occupational History  . Occupation: Retired  Scientific laboratory technician  . Financial resource strain: Not on file  . Food insecurity:    Worry: Not on file    Inability: Not on file  . Transportation needs:    Medical: Not on file    Non-medical: Not on file  Tobacco Use  . Smoking status: Never Smoker  . Smokeless tobacco: Never Used  Substance and Sexual Activity  . Alcohol use: No  . Drug use: No  . Sexual activity: Yes    Birth control/protection: None  Lifestyle  . Physical activity:    Days per week: Not on file    Minutes per session: Not on file  . Stress: Not on file  Relationships  . Social connections:    Talks on phone: Not on file    Gets together: Not on file    Attends religious service: Not on file    Active member of club or organization: Not on file    Attends meetings of clubs or organizations: Not on file    Relationship status: Not on file  Other Topics Concern  . Not on file  Social History Narrative  . Not on file     Family History: The patient's family history includes Heart attack in his father; Heart failure in his mother. ROS:   Please see the history of present illness.    All 14 point review of systems negative except as described per history of present illness  EKGs/Labs/Other Studies Reviewed:      Recent Labs: No results found for requested labs within last 8760 hours.  Recent Lipid Panel    Component Value  Date/Time   CHOL 96 (L) 09/14/2017 0840   TRIG 108 09/14/2017 0840   HDL 42 09/14/2017 0840   CHOLHDL 2.3 09/14/2017 0840   CHOLHDL 3.4 09/26/2016 0232   VLDL 21 09/26/2016 0232   LDLCALC 32 09/14/2017 0840    Physical Exam:    VS:  BP 110/60   Pulse (!) 53   Ht 5\' 5"  (1.651 m)   Wt 206 lb 9.6 oz (93.7 kg)   SpO2 97%   BMI 34.38 kg/m     Wt Readings from Last 3 Encounters:  04/26/18 206 lb 9.6 oz (93.7 kg)  03/29/18 204  lb 12.8 oz (92.9 kg)  12/28/17 202 lb 6.4 oz (91.8 kg)     GEN:  Well nourished, well developed in no acute distress HEENT: Normal NECK: No JVD; No carotid bruits LYMPHATICS: No lymphadenopathy CARDIAC: RRR, no murmurs, no rubs, no gallops RESPIRATORY:  Clear to auscultation without rales, wheezing or rhonchi  ABDOMEN: Soft, non-tender, non-distended MUSCULOSKELETAL:  No edema; No deformity  SKIN: Warm and dry LOWER EXTREMITIES: no swelling NEUROLOGIC:  Alert and oriented x 3 PSYCHIATRIC:  Normal affect   ASSESSMENT:    1. Coronary artery disease of native artery of native heart with stable angina pectoris ([place])   2. Essential hypertension   3. S/P CABG x 4   4. Mixed hyperlipidemia    PLAN:    In order of problems listed above:  1. Coronary artery disease recent admission to hospital augmentation of his medication.  Plan as outlined above we will get stress test it will be exercise Cardiolite with all medications trying to see how extensive the problem is also to check his exercise tolerance.  He may require cardiac catheterization after that. 2. Dyslipidemia he is on high intensity statin which I will continue. 3. Essential hypertension blood pressure well controlled   Medication Adjustments/Labs and Tests Ordered: Current medicines are reviewed at length with the patient today.  Concerns regarding medicines are outlined above.  No orders of the defined types were placed in this encounter.  Medication changes: No orders of the defined types  were placed in this encounter.   Signed, Park Liter, MD, [facility] 04/26/2018 10:20 AM    [place]

## 2018-04-26 NOTE — Patient Instructions (Signed)
Medication Instructions:  Your physician has recommended you make the following change in your medication:  INCREASE your medications 1 by 1 as instructed.  If you need a refill on your cardiac medications before your next appointment, please call your pharmacy.   Lab work: None ordered If you have labs (blood work) drawn today and your tests are completely normal, you will receive your results only by: Marland Kitchen MyChart Message (if you have MyChart) OR . A paper copy in the mail If you have any lab test that is abnormal or we need to change your treatment, we will call you to review the results.  Testing/Procedures: Your physician has requested that you have en exercise stress myoview. For further information please visit [email or web address]. Please follow instruction sheet, as given.  Follow-Up: At [facility], you and your health needs are our priority.  As part of our continuing mission to provide you with exceptional heart care, we have created designated Provider Care Teams.  These Care Teams include your primary Cardiologist (physician) and Advanced Practice Providers (APPs -  Physician Assistants and Nurse Practitioners) who all work together to provide you with the care you need, when you need it. You will need a follow up appointment in 1 months.  You may see  Jenne Campus or another member of our [employer] Provider Team in [place]: Shirlee More, MD . Jyl Heinz, MD  Any Other Special Instructions Will Be Listed Below (If Applicable).

## 2018-05-04 ENCOUNTER — Telehealth: Payer: Self-pay | Admitting: *Deleted

## 2018-05-04 NOTE — Telephone Encounter (Signed)
Patient given detailed instructions per Myocardial Perfusion Study Information Sheet for the test on 05/11/18 at 0830. Patient notified to arrive 15 minutes early and that it is imperative to arrive on time for appointment to keep from having the test rescheduled.  If you need to cancel or reschedule your appointment, please call the office within 24 hours of your appointment. . Patient verbalized understanding.Doyle Tegethoff, Ranae Palms

## 2018-05-06 DIAGNOSIS — M545 Low back pain: Secondary | ICD-10-CM | POA: Diagnosis not present

## 2018-05-11 ENCOUNTER — Ambulatory Visit (INDEPENDENT_AMBULATORY_CARE_PROVIDER_SITE_OTHER): Payer: Medicare Other

## 2018-05-11 DIAGNOSIS — R079 Chest pain, unspecified: Secondary | ICD-10-CM

## 2018-05-11 LAB — MYOCARDIAL PERFUSION IMAGING
CHL CUP NUCLEAR SDS: 1
LV dias vol: 91 mL (ref 62–150)
LVSYSVOL: 33 mL
NUC STRESS TID: 0.94
Peak HR: 103 {beats}/min
Rest HR: 52 {beats}/min
SRS: 4
SSS: 5

## 2018-05-11 MED ORDER — REGADENOSON 0.4 MG/5ML IV SOLN
0.4000 mg | Freq: Once | INTRAVENOUS | Status: AC
  Administered 2018-05-11: 0.4 mg via INTRAVENOUS

## 2018-05-11 MED ORDER — TECHNETIUM TC 99M TETROFOSMIN IV KIT
11.0000 | PACK | Freq: Once | INTRAVENOUS | Status: AC | PRN
  Administered 2018-05-11: 11 via INTRAVENOUS

## 2018-05-11 MED ORDER — TECHNETIUM TC 99M TETROFOSMIN IV KIT
30.6000 | PACK | Freq: Once | INTRAVENOUS | Status: AC | PRN
  Administered 2018-05-11: 30.6 via INTRAVENOUS

## 2018-05-12 ENCOUNTER — Other Ambulatory Visit: Payer: Medicare Other

## 2018-05-17 DIAGNOSIS — H04123 Dry eye syndrome of bilateral lacrimal glands: Secondary | ICD-10-CM | POA: Diagnosis not present

## 2018-05-17 DIAGNOSIS — H2513 Age-related nuclear cataract, bilateral: Secondary | ICD-10-CM | POA: Diagnosis not present

## 2018-05-24 ENCOUNTER — Ambulatory Visit (INDEPENDENT_AMBULATORY_CARE_PROVIDER_SITE_OTHER): Payer: Medicare Other | Admitting: Cardiology

## 2018-05-24 ENCOUNTER — Encounter: Payer: Self-pay | Admitting: Cardiology

## 2018-05-24 VITALS — BP 104/60 | HR 60 | Ht 65.0 in | Wt 204.8 lb

## 2018-05-24 DIAGNOSIS — Z951 Presence of aortocoronary bypass graft: Secondary | ICD-10-CM | POA: Diagnosis not present

## 2018-05-24 DIAGNOSIS — Z1383 Encounter for screening for respiratory disorder NEC: Secondary | ICD-10-CM | POA: Diagnosis not present

## 2018-05-24 DIAGNOSIS — I209 Angina pectoris, unspecified: Secondary | ICD-10-CM

## 2018-05-24 DIAGNOSIS — E782 Mixed hyperlipidemia: Secondary | ICD-10-CM | POA: Diagnosis not present

## 2018-05-24 DIAGNOSIS — I255 Ischemic cardiomyopathy: Secondary | ICD-10-CM

## 2018-05-24 DIAGNOSIS — J189 Pneumonia, unspecified organism: Secondary | ICD-10-CM | POA: Diagnosis not present

## 2018-05-24 MED ORDER — ALBUTEROL SULFATE HFA 108 (90 BASE) MCG/ACT IN AERS: 2.0000 | INHALATION_SPRAY | Freq: Two times a day (BID) | RESPIRATORY_TRACT | 2 refills | Status: DC

## 2018-05-24 MED ORDER — ISOSORBIDE MONONITRATE ER 60 MG PO TB24: 180.0000 mg | ORAL_TABLET | Freq: Every day | ORAL | 1 refills | Status: DC

## 2018-05-24 NOTE — Addendum Note (Signed)
Addended by: Ashok Norris on: 05/24/2018 03:50 PM   Modules accepted: Orders

## 2018-05-24 NOTE — Patient Instructions (Signed)
Medication Instructions:  Your physician has recommended you make the following change in your medication:   Increase: Imdur to 180 mg daily  If you need a refill on your cardiac medications before your next appointment, please call your pharmacy.   Lab work: None.  If you have labs (blood work) drawn today and your tests are completely normal, you will receive your results only by: Marland Kitchen MyChart Message (if you have MyChart) OR . A paper copy in the mail If you have any lab test that is abnormal or we need to change your treatment, we will call you to review the results.  Testing/Procedures: A chest x-ray takes a picture of the organs and structures inside the chest, including the heart, lungs, and blood vessels. This test can show several things, including, whether the heart is enlarges; whether fluid is building up in the lungs; and whether pacemaker / defibrillator leads are still in place.   Follow-Up: At [facility], you and your health needs are our priority.  As part of our continuing mission to provide you with exceptional heart care, we have created designated Provider Care Teams.  These Care Teams include your primary Cardiologist (physician) and Advanced Practice Providers (APPs -  Physician Assistants and Nurse Practitioners) who all work together to provide you with the care you need, when you need it. You will need a follow up appointment in 2 months.  Please call our office 2 months in advance to schedule this appointment.  You may see No primary care provider on file. or another member of our [employer] Provider Team in [place]: Shirlee More, MD . Jyl Heinz, MD  Any Other Special Instructions Will Be Listed Below (If Applicable).   Chest X-Ray  A chest X-ray is a painless test that uses radiation to create images of the structures inside of your chest. Chest X-rays are used to look for many health conditions, including heart failure, pneumonia, tuberculosis, rib  fractures, breathing disorders, and cancer. They may be used to diagnose chest pain, constant coughing, or trouble breathing. Tell a health care provider about:  Any allergies you have.  All medicines you are taking, including vitamins, herbs, eye drops, creams, and over-the-counter medicines.  Any surgeries you have had.  Any medical conditions you have.  Whether you are pregnant or may be pregnant. What are the risks? Getting a chest X-ray is a safe procedure. However, you will be exposed to a small amount of radiation. Being exposed to too much radiation over a lifetime can increase the risk of cancer. This risk is small, but it may occur if you have many X-rays throughout your life. What happens before the procedure?  You may be asked to remove glasses, jewelry, and any other metal objects.  You will be asked to undress from the waist up. You may be given a hospital gown to wear.  You may be asked to wear a protective lead apron to protect parts of your body from radiation. What happens during the procedure?  You will be asked to stand still as each picture is taken to get the best possible images.  You will be asked to take a deep breath and hold your breath for a few seconds.  The X-ray machine will create a picture of your chest using a tiny burst of radiation. This is painless.  More pictures may be taken from other angles. Typically, one picture will be taken while you face the X-ray camera, and another picture will  be taken from the side while you stand. If you cannot stand, you may be asked to lie down. The procedure may vary among health care providers and hospitals. What happens after the procedure?  The X-ray(s) will be reviewed by your health care provider or an X-ray (radiology) specialist.  It is up to you to get your test results. Ask your health care provider, or the department that is doing the test, when your results will be ready.  Your health care provider  will tell you if you need more tests or a follow-up exam. Keep all follow-up visits as told by your health care provider. This is important. Summary  A chest X-ray is a safe, painless test that is used to examine the inside of the chest, heart, and lungs.  You will need to undress from the waist up and remove jewelry and metal objects before the procedure.  You will be exposed to a small amount of radiation during the procedure.  The X-ray machine will take one or more pictures of your chest while you remain as still as possible.  Later, a health care provider or specialist will review the test results with you. This information is not intended to replace advice given to you by your health care provider. Make sure you discuss any questions you have with your health care provider. Document Released: 06/02/2016 Document Revised: 06/02/2016 Document Reviewed: 06/02/2016 Elsevier Interactive Patient Education  [employer].

## 2018-05-24 NOTE — Progress Notes (Signed)
Cardiology Office Note:    Date:  05/24/2018   ID:  Ernest Booker, DOB 1945/04/27, MRN [id number]  PCP:  Myer Peer, MD  Cardiologist:  Jenne Campus, MD    Referring MD: Myer Peer, MD   Chief Complaint  Patient presents with  . 1 month follow up  Doing better  History of Present Illness:    Ernest Booker is a 73 y.o. male coronary disease status post coronary artery bypass graft.  Shortly after that he occluded 3 out of his 4 grafts since that time he is being managed medically.  Recently ended going to the hospital because of chest pain medications has been increased since that time he is doing well trying to be a little more active gets chest pain from time to time but not accelerated pattern.  He ended up having stress test which showed medium defect of mild severity involving mid inferior and apical inferior wall.  However overall clinically seems to be doing well described to have some wheezes sometimes also he was told in the hospital he got pneumonia on the chest x-ray however he had no other signs and symptoms of it therefore recommendation was to repeat chest x-ray.  I will schedule him to have it repeated.  Past Medical History:  Diagnosis Date  . Anginal pain ([place])   . Arthritis   . Coronary artery disease   . Hyperlipidemia   . Hypertension   . Myocardial infarction [facility])     Past Surgical History:  Procedure Laterality Date  . CORONARY ARTERY BYPASS GRAFT N/A 09/28/2016   Procedure: CORONARY ARTERY BYPASS GRAFTING (CABG) x four, using left internal mammary artery and right leg greater saphenous vein harvested endoscopically;  Surgeon: Ivin Poot, MD;  Location: [place];  Service: Open Heart Surgery;  Laterality: N/A;  . IR THORACENTESIS ASP PLEURAL SPACE W/IMG GUIDE  10/06/2016  . LEFT HEART CATH AND CORONARY ANGIOGRAPHY N/A 09/24/2016   Procedure: Left Heart Cath and Coronary Angiography;  Surgeon: Lorretta Harp, MD;  Location: [place] [facility];   Service: Cardiovascular;  Laterality: N/A;  . LEFT HEART CATH AND CORS/GRAFTS ANGIOGRAPHY N/A 02/11/2017   Procedure: LEFT HEART CATH AND CORS/GRAFTS ANGIOGRAPHY;  Surgeon: Jettie Booze, MD;  Location: [place] [facility];  Service: Cardiovascular;  Laterality: N/A;  . TEE WITHOUT CARDIOVERSION N/A 09/28/2016   Procedure: TRANSESOPHAGEAL ECHOCARDIOGRAM (TEE);  Surgeon: Prescott Gum, Collier Salina, MD;  Location: [place];  Service: Open Heart Surgery;  Laterality: N/A;  . ULTRASOUND GUIDANCE FOR VASCULAR ACCESS  02/11/2017   Procedure: Ultrasound Guidance For Vascular Access;  Surgeon: Jettie Booze, MD;  Location: [place] [facility];  Service: Cardiovascular;;    Current Medications: Current Meds  Medication Sig  . amLODipine (NORVASC) 2.5 MG tablet Take 1 tablet (2.5 mg total) by mouth daily.  Marland Kitchen aspirin 81 MG EC tablet Take 1 tablet (81 mg total) by mouth daily.  Marland Kitchen atorvastatin (LIPITOR) 80 MG tablet Take 1 tablet (80 mg total) by mouth daily.  . furosemide (LASIX) 20 MG tablet TAKE 1 TABLET BY MOUTH AS NEEDED FOR LOWER EXTREMITY SWELLING.  . isosorbide mononitrate (IMDUR) 120 MG 24 hr tablet Take 1 tablet (120 mg total) by mouth daily. (Patient taking differently: Take 180 mg by mouth daily. )  . lisinopril (PRINIVIL,ZESTRIL) 2.5 MG tablet Take 1 tablet (2.5 mg total) by mouth daily.  . metoprolol tartrate (LOPRESSOR) 25 MG tablet Take 1 tablet (25 mg total) by mouth 2 (  two) times daily. (Patient taking differently: Take 37.5 mg by mouth 2 (two) times daily. )  . nitroGLYCERIN (NITRO-DUR) 0.1 mg/hr patch Place 1 patch (0.1 mg total) onto the skin daily.  . nitroGLYCERIN (NITROSTAT) 0.4 MG SL tablet Place 1 tablet (0.4 mg total) under the tongue every 5 (five) minutes as needed for chest pain.  . pantoprazole (PROTONIX) 40 MG tablet Take 40 mg by mouth 2 (two) times daily.  . ranolazine (RANEXA) 1000 MG SR tablet Take 1 tablet (1,000 mg total) by mouth 2 (two) times daily.  . rivaroxaban  (XARELTO) 2.5 MG TABS tablet Take 1 tablet (2.5 mg total) by mouth 2 (two) times daily.  . vitamin B-12 (CYANOCOBALAMIN) 1000 MCG tablet Take 1,000 mcg by mouth daily.     Allergies:   Benadryl [diphenhydramine hcl (sleep)] and Levofloxacin   Social History   Socioeconomic History  . Marital status: Married    Spouse name: Not on file  . Number of children: Not on file  . Years of education: Not on file  . Highest education level: Not on file  Occupational History  . Occupation: Retired  Scientific laboratory technician  . Financial resource strain: Not on file  . Food insecurity:    Worry: Not on file    Inability: Not on file  . Transportation needs:    Medical: Not on file    Non-medical: Not on file  Tobacco Use  . Smoking status: Never Smoker  . Smokeless tobacco: Never Used  Substance and Sexual Activity  . Alcohol use: No  . Drug use: No  . Sexual activity: Yes    Birth control/protection: None  Lifestyle  . Physical activity:    Days per week: Not on file    Minutes per session: Not on file  . Stress: Not on file  Relationships  . Social connections:    Talks on phone: Not on file    Gets together: Not on file    Attends religious service: Not on file    Active member of club or organization: Not on file    Attends meetings of clubs or organizations: Not on file    Relationship status: Not on file  Other Topics Concern  . Not on file  Social History Narrative  . Not on file     Family History: The patient's family history includes Heart attack in his father; Heart failure in his mother. ROS:   Please see the history of present illness.    All 14 point review of systems negative except as described per history of present illness  EKGs/Labs/Other Studies Reviewed:      Recent Labs: No results found for requested labs within last 8760 hours.  Recent Lipid Panel    Component Value Date/Time   CHOL 96 (L) 09/14/2017 0840   TRIG 108 09/14/2017 0840   HDL 42  09/14/2017 0840   CHOLHDL 2.3 09/14/2017 0840   CHOLHDL 3.4 09/26/2016 0232   VLDL 21 09/26/2016 0232   LDLCALC 32 09/14/2017 0840    Physical Exam:    VS:  BP 104/60   Pulse 60   Ht 5\' 5"  (1.651 m)   Wt 204 lb 12.8 oz (92.9 kg)   SpO2 95%   BMI 34.08 kg/m     Wt Readings from Last 3 Encounters:  05/24/18 204 lb 12.8 oz (92.9 kg)  05/11/18 206 lb (93.4 kg)  04/26/18 206 lb 9.6 oz (93.7 kg)     GEN:  Well nourished,  well developed in no acute distress HEENT: Normal NECK: No JVD; No carotid bruits LYMPHATICS: No lymphadenopathy CARDIAC: RRR, no murmurs, no rubs, no gallops RESPIRATORY:  Clear to auscultation without rales, wheezing or rhonchi  ABDOMEN: Soft, non-tender, non-distended MUSCULOSKELETAL:  No edema; No deformity  SKIN: Warm and dry LOWER EXTREMITIES: no swelling NEUROLOGIC:  Alert and oriented x 3 PSYCHIATRIC:  Normal affect   ASSESSMENT:    1. Ischemic cardiomyopathy   2. Angina pectoris ([place])   3. S/P CABG x 4   4. Mixed hyperlipidemia    PLAN:    In order of problems listed above:  1. Ischemic cardiomyopathy on appropriate medications which I will continue biggest limiting factor is his blood pressure being low today I will increase dose of Imdur from 1 20-1 80.  Rest of the medication will be the same. 2. Angina pectoris seems to be doing well from that point review we will continue present management. 3. Status post coronary artery bypass graft.  Noted. 4. Dyslipidemia on high intensity statin. 5. Wheezes I will ask him to start taking Proventil to see if he will have any improvement.  Also he will be scheduled to have chest x-ray   Medication Adjustments/Labs and Tests Ordered: Current medicines are reviewed at length with the patient today.  Concerns regarding medicines are outlined above.  No orders of the defined types were placed in this encounter.  Medication changes: No orders of the defined types were placed in this  encounter.   Signed, Park Liter, MD, [facility] 05/24/2018 3:38 PM    [place]

## 2018-06-02 ENCOUNTER — Telehealth: Payer: Self-pay | Admitting: Cardiology

## 2018-06-02 NOTE — Telephone Encounter (Signed)
Wants chest xray results

## 2018-06-03 NOTE — Telephone Encounter (Signed)
Wife called back, informed her of chest xray results. No further questions.

## 2018-06-03 NOTE — Telephone Encounter (Signed)
Left message for patient to return call.

## 2018-06-15 DIAGNOSIS — M25512 Pain in left shoulder: Secondary | ICD-10-CM | POA: Diagnosis not present

## 2018-06-15 DIAGNOSIS — M19012 Primary osteoarthritis, left shoulder: Secondary | ICD-10-CM | POA: Diagnosis not present

## 2018-06-20 ENCOUNTER — Telehealth: Payer: Self-pay | Admitting: Cardiology

## 2018-06-20 MED ORDER — METOPROLOL TARTRATE 25 MG PO TABS: 37.5000 mg | ORAL_TABLET | Freq: Two times a day (BID) | ORAL | 2 refills | Status: DC

## 2018-06-20 NOTE — Telephone Encounter (Signed)
All metoprolol to [facility] [address]

## 2018-06-20 NOTE — Telephone Encounter (Signed)
Metoprolol Tartrate 25 mg (1.5 tablet twice daily) refilled

## 2018-06-20 NOTE — Addendum Note (Signed)
Addended by: Ashok Norris on: 06/20/2018 11:18 AM   Modules accepted: Orders

## 2018-07-05 DIAGNOSIS — H2511 Age-related nuclear cataract, right eye: Secondary | ICD-10-CM | POA: Diagnosis not present

## 2018-07-05 DIAGNOSIS — Z01818 Encounter for other preprocedural examination: Secondary | ICD-10-CM | POA: Diagnosis not present

## 2018-07-28 ENCOUNTER — Other Ambulatory Visit: Payer: Self-pay

## 2018-07-28 MED ORDER — ISOSORBIDE MONONITRATE ER 60 MG PO TB24: 180.0000 mg | ORAL_TABLET | Freq: Every day | ORAL | 2 refills | Status: DC

## 2018-08-11 ENCOUNTER — Encounter: Payer: Self-pay | Admitting: Cardiology

## 2018-08-11 ENCOUNTER — Telehealth (INDEPENDENT_AMBULATORY_CARE_PROVIDER_SITE_OTHER): Payer: Medicare Other | Admitting: Cardiology

## 2018-08-11 ENCOUNTER — Other Ambulatory Visit: Payer: Self-pay

## 2018-08-11 VITALS — BP 127/70 | HR 65 | Wt 196.0 lb

## 2018-08-11 DIAGNOSIS — E782 Mixed hyperlipidemia: Secondary | ICD-10-CM

## 2018-08-11 DIAGNOSIS — Z951 Presence of aortocoronary bypass graft: Secondary | ICD-10-CM

## 2018-08-11 DIAGNOSIS — I25118 Atherosclerotic heart disease of native coronary artery with other forms of angina pectoris: Secondary | ICD-10-CM | POA: Diagnosis not present

## 2018-08-11 DIAGNOSIS — I10 Essential (primary) hypertension: Secondary | ICD-10-CM

## 2018-08-11 NOTE — Patient Instructions (Signed)
Medication Instructions:  Your physician recommends that you continue on your current medications as directed. Please refer to the Current Medication list given to you today.  If you need a refill on your cardiac medications before your next appointment, please call your pharmacy.   Lab work: None If you have labs (blood work) drawn today and your tests are completely normal, you will receive your results only by: Marland Kitchen MyChart Message (if you have MyChart) OR . A paper copy in the mail If you have any lab test that is abnormal or we need to change your treatment, we will call you to review the results.  Testing/Procedures: NOne  Follow-Up: At [facility], you and your health needs are our priority.  As part of our continuing mission to provide you with exceptional heart care, we have created designated Provider Care Teams.  These Care Teams include your primary Cardiologist (physician) and Advanced Practice Providers (APPs -  Physician Assistants and Nurse Practitioners) who all work together to provide you with the care you need, when you need it. You will need a follow up appointment in 3 months.   Any Other Special Instructions Will Be Listed Below (If Applicable).

## 2018-08-11 NOTE — Progress Notes (Signed)
Virtual Visit via Video Note   This visit type was conducted due to national recommendations for restrictions regarding the COVID-19 Pandemic (e.g. social distancing) in an effort to limit this patient's exposure and mitigate transmission in our community.  Due to his co-morbid illnesses, this patient is at least at moderate risk for complications without adequate follow up.  This format is felt to be most appropriate for this patient at this time.  All issues noted in this document were discussed and addressed.  A limited physical exam was performed with this format.  Please refer to the patient's chart for his consent to telehealth for [facility].  Evaluation Performed:  Follow-up visit  This visit type was conducted due to national recommendations for restrictions regarding the COVID-19 Pandemic (e.g. social distancing).  This format is felt to be most appropriate for this patient at this time.  All issues noted in this document were discussed and addressed.  No physical exam was performed (except for noted visual exam findings with Video Visits).  Please refer to the patient's chart (MyChart message for video visits and phone note for telephone visits) for the patient's consent to telehealth for [facility].  Date:  08/11/2018  ID: Ernest Booker, DOB Sep 14, 1945, MRN [id number]   Patient Location: [place] [id number]   Provider location:   [place] Office  PCP:  Myer Peer, MD  Cardiologist:  Jenne Campus, MD     Chief Complaint: Doing very well  History of Present Illness:    Ernest Booker is a 73 y.o. male  who presents via audio/video conferencing for a telehealth visit today.  Coronary artery disease status post coronary bypass graft with relief failure of 3 out of 4 grafts.  Since that time managed medically since there was no targets for intervention or repeated bypass surgery.  He is on maximal doses of all medications there was doing  fair however today he is telling me he is doing very well.  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  But he is very happy that he is able to come back to his beloved hobby of fishing.  He scheduled to have cataract surgery actually did surgery was scheduled before coronavirus situation started getting out of hands surgery has been postponed.  Will wait until coronavirus situation will will get better before he will have surgery from my cardiac standpoint of view there should be no problem having that surgery done there is no contraindication from cardiac standpoint reviewed if we need to hold his Xarelto for day that should be still fine.   The patient does not have symptoms concerning for COVID-19 infection (fever, chills, cough, or new SHORTNESS OF BREATH).    Prior CV studies:   The following studies were reviewed today:       Past Medical History:  Diagnosis Date  . Anginal pain ([place])   . Arthritis   . Coronary artery disease   . Hyperlipidemia   . Hypertension   . Myocardial infarction [facility])     Past Surgical History:  Procedure Laterality Date  . CORONARY ARTERY BYPASS GRAFT N/A 09/28/2016   Procedure: CORONARY ARTERY BYPASS GRAFTING (CABG) x four, using left internal mammary artery and right leg greater saphenous vein harvested endoscopically;  Surgeon: Ivin Poot,  MD;  Location: [facility];  Service: Open Heart Surgery;  Laterality: N/A;  . IR THORACENTESIS ASP PLEURAL SPACE W/IMG GUIDE  10/06/2016  . LEFT HEART CATH AND CORONARY ANGIOGRAPHY N/A 09/24/2016   Procedure: Left Heart Cath and Coronary Angiography;  Surgeon: Lorretta Harp, MD;  Location: [place] [facility];  Service: Cardiovascular;  Laterality: N/A;  . LEFT HEART CATH AND CORS/GRAFTS ANGIOGRAPHY N/A 02/11/2017   Procedure: LEFT HEART CATH AND CORS/GRAFTS ANGIOGRAPHY;   Surgeon: Jettie Booze, MD;  Location: [place] [facility] LAB;  Service: Cardiovascular;  Laterality: N/A;  . TEE WITHOUT CARDIOVERSION N/A 09/28/2016   Procedure: TRANSESOPHAGEAL ECHOCARDIOGRAM (TEE);  Surgeon: Prescott Gum, Collier Salina, MD;  Location: [place];  Service: Open Heart Surgery;  Laterality: N/A;  . ULTRASOUND GUIDANCE FOR VASCULAR ACCESS  02/11/2017   Procedure: Ultrasound Guidance For Vascular Access;  Surgeon: Jettie Booze, MD;  Location: [place] CV LAB;  Service: Cardiovascular;;     Current Meds  Medication Sig  . albuterol (PROVENTIL HFA;VENTOLIN HFA) 108 (90 Base) MCG/ACT inhaler Inhale 2 puffs into the lungs 2 (two) times daily.  Marland Kitchen amLODipine (NORVASC) 2.5 MG tablet Take 1 tablet (2.5 mg total) by mouth daily.  Marland Kitchen aspirin 81 MG EC tablet Take 1 tablet (81 mg total) by mouth daily.  Marland Kitchen atorvastatin (LIPITOR) 80 MG tablet Take 1 tablet (80 mg total) by mouth daily.  . furosemide (LASIX) 20 MG tablet TAKE 1 TABLET BY MOUTH AS NEEDED FOR LOWER EXTREMITY SWELLING.  . isosorbide mononitrate (IMDUR) 60 MG 24 hr tablet Take 3 tablets (180 mg total) by mouth daily.  Marland Kitchen lisinopril (PRINIVIL,ZESTRIL) 2.5 MG tablet Take 1 tablet (2.5 mg total) by mouth daily.  . metoprolol tartrate (LOPRESSOR) 25 MG tablet Take 1.5 tablets (37.5 mg total) by mouth 2 (two) times daily. (Patient taking differently: Take 25 mg by mouth 2 (two) times daily. )  . nitroGLYCERIN (NITRO-DUR) 0.1 mg/hr patch Place 1 patch (0.1 mg total) onto the skin daily.  . nitroGLYCERIN (NITROSTAT) 0.4 MG SL tablet Place 1 tablet (0.4 mg total) under the tongue every 5 (five) minutes as needed for chest pain.  . pantoprazole (PROTONIX) 40 MG tablet Take 40 mg by mouth 2 (two) times daily.  . ranolazine (RANEXA) 1000 MG SR tablet Take 1 tablet (1,000 mg total) by mouth 2 (two) times daily.  . rivaroxaban (XARELTO) 2.5 MG TABS tablet Take 1 tablet (2.5 mg total) by mouth 2 (two) times daily.  . vitamin B-12 (CYANOCOBALAMIN)  1000 MCG tablet Take 1,000 mcg by mouth daily.      Family History: The patient's family history includes Heart attack in his father; Heart failure in his mother.   ROS:   Please see the history of present illness.     All other systems reviewed and are negative.   Labs/Other Tests and Data Reviewed:     Recent Labs: No results found for requested labs within last 8760 hours.  Recent Lipid Panel    Component Value Date/Time   CHOL 96 (L) 09/14/2017 0840   TRIG 108 09/14/2017 0840   HDL 42 09/14/2017 0840   CHOLHDL 2.3 09/14/2017 0840   CHOLHDL 3.4 09/26/2016 0232   VLDL 21 09/26/2016 0232   LDLCALC 32 09/14/2017 0840      Exam:    Vital Signs:  BP 127/70   Pulse 65   Wt 196 lb (88.9 kg)   BMI 32.62 kg/m     Wt Readings from Last 3  Encounters:  08/11/18 196 lb (88.9 kg)  05/24/18 204 lb 12.8 oz (92.9 kg)  05/11/18 206 lb (93.4 kg)     Well nourished, well developed in no acute distress. Alert awake oriented x3 talking to me with the video link.  He is very happy because he feels so much better.  Denies having any issues there is no JVD there is no swelling of lower extremities.  Diagnosis for this visit:   1. Coronary artery disease of native artery of native heart with stable angina pectoris ([place])   2. S/P CABG x 4   3. Mixed hyperlipidemia   4. Essential hypertension      ASSESSMENT & PLAN:    1.  Coronary artery disease advanced and progressive.  But overall he seems to be improving he is able to go back and fishing again.  This is something that he was dreaming about for long time.  Denies having any chest pain, tightness, pressure, burning, squeezing in the chest. 2.  Status post coronary artery bypass graft for graft with early occlusion of 3 out of 4 grafts. 3.  Essential hypertension blood pressure well controlled continue present management. 4.  Mixed dyslipidemia continue high intensity statin.  Overall Ernest Booker is doing very well he was able to  come back to fishing.  He is very happy.  See him back in 3 months  COVID-19 Education: The signs and symptoms of COVID-19 were discussed with the patient and how to seek care for testing (follow up with PCP or arrange E-visit).  The importance of social distancing was discussed today.  Patient Risk:   After full review of this patients clinical status, I feel that they are at least moderate risk at this time.  Time:   Today, I have spent 17 minutes with the patient with telehealth technology discussing pt health issues.  I spent 5 minutes reviewing her chart before the visit.  Visit was finished at 3:31 PM.    Medication Adjustments/Labs and Tests Ordered: Current medicines are reviewed at length with the patient today.  Concerns regarding medicines are outlined above.  No orders of the defined types were placed in this encounter.  Medication changes: No orders of the defined types were placed in this encounter.    Disposition: Follow-up in 3 months  Signed, Park Liter, MD, [facility] 08/11/2018 3:33 PM    [place]

## 2018-08-30 DIAGNOSIS — L578 Other skin changes due to chronic exposure to nonionizing radiation: Secondary | ICD-10-CM | POA: Diagnosis not present

## 2018-08-30 DIAGNOSIS — L219 Seborrheic dermatitis, unspecified: Secondary | ICD-10-CM | POA: Diagnosis not present

## 2018-08-30 DIAGNOSIS — L570 Actinic keratosis: Secondary | ICD-10-CM | POA: Diagnosis not present

## 2018-09-08 DIAGNOSIS — Z01818 Encounter for other preprocedural examination: Secondary | ICD-10-CM | POA: Diagnosis not present

## 2018-09-08 DIAGNOSIS — H2511 Age-related nuclear cataract, right eye: Secondary | ICD-10-CM | POA: Diagnosis not present

## 2018-09-13 ENCOUNTER — Other Ambulatory Visit: Payer: Self-pay | Admitting: Cardiology

## 2018-09-13 DIAGNOSIS — I252 Old myocardial infarction: Secondary | ICD-10-CM | POA: Diagnosis not present

## 2018-09-13 DIAGNOSIS — Z85828 Personal history of other malignant neoplasm of skin: Secondary | ICD-10-CM | POA: Diagnosis not present

## 2018-09-13 DIAGNOSIS — Z79899 Other long term (current) drug therapy: Secondary | ICD-10-CM | POA: Diagnosis not present

## 2018-09-13 DIAGNOSIS — H2511 Age-related nuclear cataract, right eye: Secondary | ICD-10-CM | POA: Diagnosis not present

## 2018-09-13 DIAGNOSIS — I2510 Atherosclerotic heart disease of native coronary artery without angina pectoris: Secondary | ICD-10-CM | POA: Diagnosis not present

## 2018-09-13 DIAGNOSIS — K219 Gastro-esophageal reflux disease without esophagitis: Secondary | ICD-10-CM | POA: Diagnosis not present

## 2018-09-13 DIAGNOSIS — H259 Unspecified age-related cataract: Secondary | ICD-10-CM | POA: Diagnosis not present

## 2018-09-13 DIAGNOSIS — Z8719 Personal history of other diseases of the digestive system: Secondary | ICD-10-CM | POA: Diagnosis not present

## 2018-09-13 DIAGNOSIS — I10 Essential (primary) hypertension: Secondary | ICD-10-CM | POA: Diagnosis not present

## 2018-09-13 DIAGNOSIS — Z7901 Long term (current) use of anticoagulants: Secondary | ICD-10-CM | POA: Diagnosis not present

## 2018-09-13 DIAGNOSIS — H04123 Dry eye syndrome of bilateral lacrimal glands: Secondary | ICD-10-CM | POA: Diagnosis not present

## 2018-09-13 DIAGNOSIS — G4733 Obstructive sleep apnea (adult) (pediatric): Secondary | ICD-10-CM | POA: Diagnosis not present

## 2018-09-13 DIAGNOSIS — H9190 Unspecified hearing loss, unspecified ear: Secondary | ICD-10-CM | POA: Diagnosis not present

## 2018-09-13 DIAGNOSIS — M1990 Unspecified osteoarthritis, unspecified site: Secondary | ICD-10-CM | POA: Diagnosis not present

## 2018-09-13 DIAGNOSIS — Z951 Presence of aortocoronary bypass graft: Secondary | ICD-10-CM | POA: Diagnosis not present

## 2018-09-18 ENCOUNTER — Other Ambulatory Visit: Payer: Self-pay | Admitting: Cardiology

## 2018-09-19 ENCOUNTER — Other Ambulatory Visit: Payer: Self-pay | Admitting: Cardiology

## 2018-09-21 DIAGNOSIS — Z6831 Body mass index (BMI) 31.0-31.9, adult: Secondary | ICD-10-CM | POA: Diagnosis not present

## 2018-09-21 DIAGNOSIS — R143 Flatulence: Secondary | ICD-10-CM | POA: Diagnosis not present

## 2018-09-21 DIAGNOSIS — Z1331 Encounter for screening for depression: Secondary | ICD-10-CM | POA: Diagnosis not present

## 2018-09-21 DIAGNOSIS — Z79899 Other long term (current) drug therapy: Secondary | ICD-10-CM | POA: Diagnosis not present

## 2018-09-21 DIAGNOSIS — E785 Hyperlipidemia, unspecified: Secondary | ICD-10-CM | POA: Diagnosis not present

## 2018-09-21 DIAGNOSIS — R413 Other amnesia: Secondary | ICD-10-CM | POA: Diagnosis not present

## 2018-09-21 DIAGNOSIS — I25709 Atherosclerosis of coronary artery bypass graft(s), unspecified, with unspecified angina pectoris: Secondary | ICD-10-CM | POA: Diagnosis not present

## 2018-09-21 DIAGNOSIS — K219 Gastro-esophageal reflux disease without esophagitis: Secondary | ICD-10-CM | POA: Diagnosis not present

## 2018-10-12 DIAGNOSIS — R413 Other amnesia: Secondary | ICD-10-CM | POA: Diagnosis not present

## 2018-10-12 DIAGNOSIS — S0005XA Superficial foreign body of scalp, initial encounter: Secondary | ICD-10-CM | POA: Diagnosis not present

## 2018-10-25 ENCOUNTER — Other Ambulatory Visit: Payer: Self-pay | Admitting: Cardiology

## 2018-11-01 DIAGNOSIS — I10 Essential (primary) hypertension: Secondary | ICD-10-CM | POA: Diagnosis not present

## 2018-11-01 DIAGNOSIS — Z79899 Other long term (current) drug therapy: Secondary | ICD-10-CM | POA: Diagnosis not present

## 2018-11-01 DIAGNOSIS — K219 Gastro-esophageal reflux disease without esophagitis: Secondary | ICD-10-CM | POA: Diagnosis not present

## 2018-11-01 DIAGNOSIS — Z7982 Long term (current) use of aspirin: Secondary | ICD-10-CM | POA: Diagnosis not present

## 2018-11-01 DIAGNOSIS — I252 Old myocardial infarction: Secondary | ICD-10-CM | POA: Diagnosis not present

## 2018-11-01 DIAGNOSIS — I2510 Atherosclerotic heart disease of native coronary artery without angina pectoris: Secondary | ICD-10-CM | POA: Diagnosis not present

## 2018-11-01 DIAGNOSIS — Z951 Presence of aortocoronary bypass graft: Secondary | ICD-10-CM | POA: Diagnosis not present

## 2018-11-01 DIAGNOSIS — E785 Hyperlipidemia, unspecified: Secondary | ICD-10-CM | POA: Diagnosis not present

## 2018-11-01 DIAGNOSIS — H259 Unspecified age-related cataract: Secondary | ICD-10-CM | POA: Diagnosis not present

## 2018-11-01 DIAGNOSIS — E669 Obesity, unspecified: Secondary | ICD-10-CM | POA: Diagnosis not present

## 2018-11-01 DIAGNOSIS — G4733 Obstructive sleep apnea (adult) (pediatric): Secondary | ICD-10-CM | POA: Diagnosis not present

## 2018-11-01 DIAGNOSIS — H2512 Age-related nuclear cataract, left eye: Secondary | ICD-10-CM | POA: Diagnosis not present

## 2018-11-08 ENCOUNTER — Other Ambulatory Visit: Payer: Self-pay | Admitting: Cardiology

## 2018-11-17 ENCOUNTER — Encounter: Payer: Self-pay | Admitting: Cardiology

## 2018-11-17 ENCOUNTER — Telehealth (INDEPENDENT_AMBULATORY_CARE_PROVIDER_SITE_OTHER): Payer: Medicare Other | Admitting: Cardiology

## 2018-11-17 ENCOUNTER — Other Ambulatory Visit: Payer: Self-pay

## 2018-11-17 VITALS — BP 124/74 | HR 55 | Temp 97.8°F

## 2018-11-17 DIAGNOSIS — I10 Essential (primary) hypertension: Secondary | ICD-10-CM

## 2018-11-17 DIAGNOSIS — Z951 Presence of aortocoronary bypass graft: Secondary | ICD-10-CM

## 2018-11-17 DIAGNOSIS — I209 Angina pectoris, unspecified: Secondary | ICD-10-CM

## 2018-11-17 DIAGNOSIS — I255 Ischemic cardiomyopathy: Secondary | ICD-10-CM

## 2018-11-17 DIAGNOSIS — I25118 Atherosclerotic heart disease of native coronary artery with other forms of angina pectoris: Secondary | ICD-10-CM

## 2018-11-17 NOTE — Progress Notes (Signed)
Virtual Visit via Telephone Note   This visit type was conducted due to national recommendations for restrictions regarding the COVID-19 Pandemic (e.g. social distancing) in an effort to limit this patient's exposure and mitigate transmission in our community.  Due to his co-morbid illnesses, this patient is at least at moderate risk for complications without adequate follow up.  This format is felt to be most appropriate for this patient at this time.  The patient did not have access to video technology/had technical difficulties with video requiring transitioning to audio format only (telephone).  All issues noted in this document were discussed and addressed.  No physical exam could be performed with this format.  Please refer to the patient's chart for his  consent to telehealth for [facility].  Evaluation Performed:  Follow-up visit  This visit type was conducted due to national recommendations for restrictions regarding the COVID-19 Pandemic (e.g. social distancing).  This format is felt to be most appropriate for this patient at this time.  All issues noted in this document were discussed and addressed.  No physical exam was performed (except for noted visual exam findings with Video Visits).  Please refer to the patient's chart (MyChart message for video visits and phone note for telephone visits) for the patient's consent to telehealth for [facility].  Date:  11/17/2018  ID: Ernest Booker, DOB Feb 01, 1946, MRN [id number]   Patient Location: [place] [address]   Provider location:   [place] Office  PCP:  Myer Peer, MD  Cardiologist:  Jenne Campus, MD     Chief Complaint: Doing fair  History of Present Illness:    Ernest Booker is a 73 y.o. male  who presents via audio/video conferencing for a telehealth visit today.  Complex past medical history which include coronary artery disease status post coronary artery bypass graft done few  years ago however 3 grafts failed out of 4.  He is on medical therapy doing well actually if from chest pain point of view he have to take very few nitroglycerin since I talked to him last time.  He complained of being weak tired and exhausted.  He laughs fishing however lately he did not go today because he did not feel like.  He still try to walk 3-4 times a day and doing quite well with that.  I spoke to his wife also and she brought an issue of the fact that Ernest Booker is forgetful.  She asked me if there is anything that we can do about it I asked her to engage him in social activities to exercise his brain.   The patient does not have symptoms concerning for COVID-19 infection (fever, chills, cough, or new SHORTNESS OF BREATH).    Prior CV studies:   The following studies were reviewed today:       Past Medical History:  Diagnosis Date  . Anginal pain ([place])   . Arthritis   . Coronary artery disease   . Hyperlipidemia   . Hypertension   . Myocardial infarction [facility])     Past Surgical History:  Procedure Laterality Date  . CORONARY ARTERY BYPASS GRAFT N/A 09/28/2016   Procedure: CORONARY ARTERY BYPASS GRAFTING (CABG) x four, using left internal mammary artery and right leg greater saphenous vein harvested endoscopically;  Surgeon: Ivin Poot, MD;  Location: [place];  Service: Open Heart Surgery;  Laterality: N/A;  . IR THORACENTESIS ASP PLEURAL SPACE W/IMG GUIDE  10/06/2016  .  LEFT HEART CATH AND CORONARY ANGIOGRAPHY N/A 09/24/2016   Procedure: Left Heart Cath and Coronary Angiography;  Surgeon: Lorretta Harp, MD;  Location: [place] CV LAB;  Service: Cardiovascular;  Laterality: N/A;  . LEFT HEART CATH AND CORS/GRAFTS ANGIOGRAPHY N/A 02/11/2017   Procedure: LEFT HEART CATH AND CORS/GRAFTS ANGIOGRAPHY;  Surgeon: Jettie Booze, MD;  Location: [place] CV LAB;  Service: Cardiovascular;  Laterality: N/A;  . TEE WITHOUT CARDIOVERSION N/A 09/28/2016   Procedure:  TRANSESOPHAGEAL ECHOCARDIOGRAM (TEE);  Surgeon: Prescott Gum, Collier Salina, MD;  Location: [place];  Service: Open Heart Surgery;  Laterality: N/A;  . ULTRASOUND GUIDANCE FOR VASCULAR ACCESS  02/11/2017   Procedure: Ultrasound Guidance For Vascular Access;  Surgeon: Jettie Booze, MD;  Location: [place] CV LAB;  Service: Cardiovascular;;     Current Meds  Medication Sig  . amLODipine (NORVASC) 2.5 MG tablet TAKE 1 TABLET BY MOUTH EVERY DAY  . aspirin 81 MG EC tablet Take 1 tablet (81 mg total) by mouth daily.  Marland Kitchen atorvastatin (LIPITOR) 80 MG tablet Take 1 tablet (80 mg total) by mouth daily.  . furosemide (LASIX) 20 MG tablet TAKE 1 TABLET BY MOUTH AS NEEDED FOR LOWER EXTREMITY SWELLING.  . isosorbide mononitrate (IMDUR) 60 MG 24 hr tablet Take 3 tablets (180 mg total) by mouth daily.  Marland Kitchen lisinopril (PRINIVIL,ZESTRIL) 2.5 MG tablet Take 1 tablet (2.5 mg total) by mouth daily.  . metoprolol tartrate (LOPRESSOR) 25 MG tablet TAKE 1.5 TABLETS (37.5 MG TOTAL) BY MOUTH 2 (TWO) TIMES DAILY.  . nitroGLYCERIN (NITRODUR - DOSED IN MG/24 HR) 0.1 mg/hr patch PLACE 1 PATCH ON THE SKIN DAILY  . nitroGLYCERIN (NITROSTAT) 0.4 MG SL tablet Place 1 tablet (0.4 mg total) under the tongue every 5 (five) minutes as needed for chest pain.  . pantoprazole (PROTONIX) 40 MG tablet Take 40 mg by mouth 2 (two) times daily.  . ranolazine (RANEXA) 1000 MG SR tablet Take 1 tablet by mouth twice daily  . rivaroxaban (XARELTO) 2.5 MG TABS tablet Take 1 tablet (2.5 mg total) by mouth 2 (two) times daily.  . vitamin B-12 (CYANOCOBALAMIN) 1000 MCG tablet Take 1,000 mcg by mouth daily.      Family History: The patient's family history includes Heart attack in his father; Heart failure in his mother.   ROS:   Please see the history of present illness.     All other systems reviewed and are negative.   Labs/Other Tests and Data Reviewed:     Recent Labs: No results found for requested labs within last 8760 hours.   Recent Lipid Panel    Component Value Date/Time   CHOL 96 (L) 09/14/2017 0840   TRIG 108 09/14/2017 0840   HDL 42 09/14/2017 0840   CHOLHDL 2.3 09/14/2017 0840   CHOLHDL 3.4 09/26/2016 0232   VLDL 21 09/26/2016 0232   LDLCALC 32 09/14/2017 0840      Exam:    Vital Signs:  BP 124/74   Pulse (!) 55   Temp 97.8 F (36.6 C)   SpO2 94%     Wt Readings from Last 3 Encounters:  08/11/18 196 lb (88.9 kg)  05/24/18 204 lb 12.8 oz (92.9 kg)  05/11/18 206 lb (93.4 kg)     Well nourished, well developed in no acute distress. Alert awake oriented x3 happy to be able to talk to me.  We took over the phone unable to establish video link.  Not in any distress at the time of  my interview  Diagnosis for this visit:   1. Essential hypertension   2. Ischemic cardiomyopathy   3. Angina pectoris ([place])   4. Coronary artery disease of native artery of native heart with stable angina pectoris ([place])   5. S/P CABG x 4      ASSESSMENT & PLAN:    1.  Essential hypertension blood pressure appears to be controlled his wife records his EKG on the regular basis does sometimes the blood pressure drops that sometimes the blood pressure goes up but overall seems to be doing well 2.  Ischemic cardiomyopathy with his complaint of weakness and fatigue I will schedule him to have an echocardiogram. 3.  Angina pectoris rare episodes doing well overall. 4.  Coronary artery disease plan as outlined above.  COVID-19 Education: The signs and symptoms of COVID-19 were discussed with the patient and how to seek care for testing (follow up with PCP or arrange E-visit).  The importance of social distancing was discussed today.  Patient Risk:   After full review of this patients clinical status, I feel that they are at least moderate risk at this time.  Time:   Today, I have spent 16 minutes with the patient with telehealth technology discussing pt health issues.  I spent 5 minutes reviewing her chart before  the visit.  Visit was finished at 10:25 AM.    Medication Adjustments/Labs and Tests Ordered: Current medicines are reviewed at length with the patient today.  Concerns regarding medicines are outlined above.  No orders of the defined types were placed in this encounter.  Medication changes: No orders of the defined types were placed in this encounter.    Disposition: Follow-up in 2 months  Signed, Park Liter, MD, [facility] 11/17/2018 11:25 AM    [place]

## 2018-11-17 NOTE — Patient Instructions (Signed)
Medication Instructions:  Your physician recommends that you continue on your current medications as directed. Please refer to the Current Medication list given to you today.  If you need a refill on your cardiac medications before your next appointment, please call your pharmacy.   Lab work: NONE If you have labs (blood work) drawn today and your tests are completely normal, you will receive your results only by: Marland Kitchen MyChart Message (if you have MyChart) OR . A paper copy in the mail If you have any lab test that is abnormal or we need to change your treatment, we will call you to review the results.  Testing/Procedures: Your physician has requested that you have an echocardiogram. Echocardiography is a painless test that uses sound waves to create images of your heart. It provides your doctor with information about the size and shape of your heart and how well your heart's chambers and valves are working. This procedure takes approximately one hour. There are no restrictions for this procedure.    Follow-Up: At [facility], you and your health needs are our priority.  As part of our continuing mission to provide you with exceptional heart care, we have created designated Provider Care Teams.  These Care Teams include your primary Cardiologist (physician) and Advanced Practice Providers (APPs -  Physician Assistants and Nurse Practitioners) who all work together to provide you with the care you need, when you need it. You will need a follow up appointment in 2 months.  Any Other Special Instructions Will Be Listed Below (If Applicable).   Echocardiogram An echocardiogram is a procedure that uses painless sound waves (ultrasound) to produce an image of the heart. Images from an echocardiogram can provide important information about:  Signs of coronary artery disease (CAD).  Aneurysm detection. An aneurysm is a weak or damaged part of an artery wall that bulges out from the normal force  of blood pumping through the body.  Heart size and shape. Changes in the size or shape of the heart can be associated with certain conditions, including heart failure, aneurysm, and CAD.  Heart muscle function.  Heart valve function.  Signs of a past heart attack.  Fluid buildup around the heart.  Thickening of the heart muscle.  A tumor or infectious growth around the heart valves. Tell a health care provider about:  Any allergies you have.  All medicines you are taking, including vitamins, herbs, eye drops, creams, and over-the-counter medicines.  Any blood disorders you have.  Any surgeries you have had.  Any medical conditions you have.  Whether you are pregnant or may be pregnant. What are the risks? Generally, this is a safe procedure. However, problems may occur, including:  Allergic reaction to dye (contrast) that may be used during the procedure. What happens before the procedure? No specific preparation is needed. You may eat and drink normally. What happens during the procedure?   An IV tube may be inserted into one of your veins.  You may receive contrast through this tube. A contrast is an injection that improves the quality of the pictures from your heart.  A gel will be applied to your chest.  A wand-like tool (transducer) will be moved over your chest. The gel will help to transmit the sound waves from the transducer.  The sound waves will harmlessly bounce off of your heart to allow the heart images to be captured in real-time motion. The images will be recorded on a computer. The procedure may vary among  health care providers and hospitals. What happens after the procedure?  You may return to your normal, everyday life, including diet, activities, and medicines, unless your health care provider tells you not to do that. Summary  An echocardiogram is a procedure that uses painless sound waves (ultrasound) to produce an image of the heart.  Images  from an echocardiogram can provide important information about the size and shape of your heart, heart muscle function, heart valve function, and fluid buildup around your heart.  You do not need to do anything to prepare before this procedure. You may eat and drink normally.  After the echocardiogram is completed, you may return to your normal, everyday life, unless your health care provider tells you not to do that. This information is not intended to replace advice given to you by your health care provider. Make sure you discuss any questions you have with your health care provider. Document Released: 04/03/2000 Document Revised: 07/28/2018 Document Reviewed: 05/09/2016 Elsevier Patient Education  2020 Elsevier Inc.    

## 2018-11-17 NOTE — Addendum Note (Signed)
Addended by: Polly Cobia A on: 11/17/2018 11:44 AM   Modules accepted: Orders

## 2018-11-23 ENCOUNTER — Ambulatory Visit (INDEPENDENT_AMBULATORY_CARE_PROVIDER_SITE_OTHER): Payer: Medicare Other

## 2018-11-23 ENCOUNTER — Other Ambulatory Visit: Payer: Self-pay

## 2018-11-23 DIAGNOSIS — I209 Angina pectoris, unspecified: Secondary | ICD-10-CM | POA: Diagnosis not present

## 2018-11-23 DIAGNOSIS — I25118 Atherosclerotic heart disease of native coronary artery with other forms of angina pectoris: Secondary | ICD-10-CM

## 2018-11-23 DIAGNOSIS — I255 Ischemic cardiomyopathy: Secondary | ICD-10-CM

## 2018-11-23 NOTE — Progress Notes (Signed)
Complete echocardiogram has been performed.  Jimmy Candelario Steppe RDCS, RVT 

## 2018-11-30 ENCOUNTER — Telehealth: Payer: Self-pay | Admitting: Cardiology

## 2018-11-30 NOTE — Telephone Encounter (Signed)
Returned call to wife who states patient took 3 nitroglycerin over 15 minute span and got relief. She is asking how often in a day he can he can take the nitroglycerin. Informed her we recommend going to the emergency department if you get to the 3rd nitroglycerin. She verbalizes understanding and will go to the ED if he has to take it again.

## 2018-12-19 ENCOUNTER — Other Ambulatory Visit: Payer: Self-pay | Admitting: Cardiology

## 2018-12-20 ENCOUNTER — Other Ambulatory Visit: Payer: Self-pay | Admitting: Cardiology

## 2019-01-06 DIAGNOSIS — I361 Nonrheumatic tricuspid (valve) insufficiency: Secondary | ICD-10-CM | POA: Diagnosis not present

## 2019-01-06 DIAGNOSIS — R079 Chest pain, unspecified: Secondary | ICD-10-CM | POA: Diagnosis not present

## 2019-01-06 DIAGNOSIS — I5032 Chronic diastolic (congestive) heart failure: Secondary | ICD-10-CM

## 2019-01-06 DIAGNOSIS — I2510 Atherosclerotic heart disease of native coronary artery without angina pectoris: Secondary | ICD-10-CM

## 2019-01-06 DIAGNOSIS — I252 Old myocardial infarction: Secondary | ICD-10-CM | POA: Diagnosis not present

## 2019-01-06 DIAGNOSIS — K219 Gastro-esophageal reflux disease without esophagitis: Secondary | ICD-10-CM | POA: Diagnosis not present

## 2019-01-06 DIAGNOSIS — Z7982 Long term (current) use of aspirin: Secondary | ICD-10-CM | POA: Diagnosis not present

## 2019-01-06 DIAGNOSIS — R0789 Other chest pain: Secondary | ICD-10-CM

## 2019-01-06 DIAGNOSIS — I272 Pulmonary hypertension, unspecified: Secondary | ICD-10-CM | POA: Diagnosis not present

## 2019-01-06 DIAGNOSIS — R001 Bradycardia, unspecified: Secondary | ICD-10-CM | POA: Diagnosis not present

## 2019-01-06 DIAGNOSIS — Z79899 Other long term (current) drug therapy: Secondary | ICD-10-CM | POA: Diagnosis not present

## 2019-01-06 DIAGNOSIS — I200 Unstable angina: Secondary | ICD-10-CM | POA: Diagnosis not present

## 2019-01-06 DIAGNOSIS — Z951 Presence of aortocoronary bypass graft: Secondary | ICD-10-CM | POA: Diagnosis not present

## 2019-01-06 DIAGNOSIS — I110 Hypertensive heart disease with heart failure: Secondary | ICD-10-CM

## 2019-01-07 DIAGNOSIS — R0789 Other chest pain: Secondary | ICD-10-CM | POA: Diagnosis not present

## 2019-01-07 DIAGNOSIS — I5032 Chronic diastolic (congestive) heart failure: Secondary | ICD-10-CM | POA: Diagnosis not present

## 2019-01-07 DIAGNOSIS — R079 Chest pain, unspecified: Secondary | ICD-10-CM | POA: Diagnosis not present

## 2019-01-07 DIAGNOSIS — I2510 Atherosclerotic heart disease of native coronary artery without angina pectoris: Secondary | ICD-10-CM | POA: Diagnosis not present

## 2019-01-07 DIAGNOSIS — I110 Hypertensive heart disease with heart failure: Secondary | ICD-10-CM | POA: Diagnosis not present

## 2019-01-09 DIAGNOSIS — Z1331 Encounter for screening for depression: Secondary | ICD-10-CM | POA: Diagnosis not present

## 2019-01-09 DIAGNOSIS — I25709 Atherosclerosis of coronary artery bypass graft(s), unspecified, with unspecified angina pectoris: Secondary | ICD-10-CM | POA: Diagnosis not present

## 2019-01-09 DIAGNOSIS — R413 Other amnesia: Secondary | ICD-10-CM | POA: Diagnosis not present

## 2019-01-09 DIAGNOSIS — Z6831 Body mass index (BMI) 31.0-31.9, adult: Secondary | ICD-10-CM | POA: Diagnosis not present

## 2019-01-12 DIAGNOSIS — Z23 Encounter for immunization: Secondary | ICD-10-CM | POA: Diagnosis not present

## 2019-01-14 ENCOUNTER — Other Ambulatory Visit: Payer: Self-pay | Admitting: Cardiology

## 2019-01-16 NOTE — Telephone Encounter (Signed)
60m 92.9kg Scr 1.17 09/21/18(KPN) ccr 77.2mlmin Lovw/krasowski 11/30/18

## 2019-01-16 NOTE — Telephone Encounter (Signed)
Refill request

## 2019-01-18 ENCOUNTER — Ambulatory Visit: Payer: Medicare Other | Admitting: Cardiology

## 2019-01-18 ENCOUNTER — Ambulatory Visit (INDEPENDENT_AMBULATORY_CARE_PROVIDER_SITE_OTHER): Payer: Medicare Other | Admitting: Cardiology

## 2019-01-18 ENCOUNTER — Other Ambulatory Visit: Payer: Self-pay

## 2019-01-18 ENCOUNTER — Encounter: Payer: Self-pay | Admitting: Cardiology

## 2019-01-18 VITALS — BP 114/66 | HR 54 | Ht 65.0 in | Wt 198.0 lb

## 2019-01-18 DIAGNOSIS — E782 Mixed hyperlipidemia: Secondary | ICD-10-CM

## 2019-01-18 DIAGNOSIS — I255 Ischemic cardiomyopathy: Secondary | ICD-10-CM

## 2019-01-18 DIAGNOSIS — Z951 Presence of aortocoronary bypass graft: Secondary | ICD-10-CM | POA: Diagnosis not present

## 2019-01-18 DIAGNOSIS — I10 Essential (primary) hypertension: Secondary | ICD-10-CM

## 2019-01-18 MED ORDER — AMLODIPINE BESYLATE 5 MG PO TABS: 5.0000 mg | ORAL_TABLET | Freq: Every day | ORAL | 3 refills | Status: DC

## 2019-01-18 NOTE — Addendum Note (Signed)
Addended by: Polly Cobia A on: 01/18/2019 03:08 PM   Modules accepted: Orders

## 2019-01-18 NOTE — Progress Notes (Signed)
Cardiology Office Note:    Date:  01/18/2019   ID:  Ernest Booker, DOB [birth year], MRN [id number]  PCP:  Myer Peer, MD  Cardiologist:  Jenne Campus, MD    Referring MD: Myer Peer, MD   Chief Complaint  Patient presents with  . 2 month follow up  Doing poorly  History of Present Illness:    Ernest Booker is a 73 y.o. male with coronary artery disease status post coronary artery bypass graft and shortly after bypass graft he occluded 3 out of 4 his past.  Comes today to my office for follow-up.  He is not doing well I think the biggest issue is psychological problem he used to be a person on the go all the time he is to do a lot of things now he is not able to do that and he is feeling very bad about it.  He talks to me with his wife about that.  Denies having any unusual problems still get some chest pain on and off he required nitroglycerin few times a week.  He in the matter-of-fact end up going to [facility] about 2 weeks ago because of episode of chest pain.  He was kept overnight and discharged home.  Past Medical History:  Diagnosis Date  . Anginal pain ([place])   . Arthritis   . Coronary artery disease   . Hyperlipidemia   . Hypertension   . Myocardial infarction [facility])     Past Surgical History:  Procedure Laterality Date  . CORONARY ARTERY BYPASS GRAFT N/A 09/28/2016   Procedure: CORONARY ARTERY BYPASS GRAFTING (CABG) x four, using left internal mammary artery and right leg greater saphenous vein harvested endoscopically;  Surgeon: Ivin Poot, MD;  Location: [place];  Service: Open Heart Surgery;  Laterality: N/A;  . IR THORACENTESIS ASP PLEURAL SPACE W/IMG GUIDE  10/06/2016  . LEFT HEART CATH AND CORONARY ANGIOGRAPHY N/A 09/24/2016   Procedure: Left Heart Cath and Coronary Angiography;  Surgeon: Lorretta Harp, MD;  Location: [place] CV LAB;  Service: Cardiovascular;  Laterality: N/A;  . LEFT HEART CATH AND CORS/GRAFTS ANGIOGRAPHY N/A 02/11/2017   Procedure: LEFT HEART CATH AND CORS/GRAFTS ANGIOGRAPHY;  Surgeon: Jettie Booze, MD;  Location: [place] CV LAB;  Service: Cardiovascular;  Laterality: N/A;  . TEE WITHOUT CARDIOVERSION N/A 09/28/2016   Procedure: TRANSESOPHAGEAL ECHOCARDIOGRAM (TEE);  Surgeon: Prescott Gum, Collier Salina, MD;  Location: [place];  Service: Open Heart Surgery;  Laterality: N/A;  . ULTRASOUND GUIDANCE FOR VASCULAR ACCESS  02/11/2017   Procedure: Ultrasound Guidance For Vascular Access;  Surgeon: Jettie Booze, MD;  Location: [place] CV LAB;  Service: Cardiovascular;;    Current Medications: Current Meds  Medication Sig  . amLODipine (NORVASC) 2.5 MG tablet TAKE 1 TABLET BY MOUTH EVERY DAY  . aspirin 81 MG EC tablet Take 1 tablet (81 mg total) by mouth daily.  Marland Kitchen atorvastatin (LIPITOR) 80 MG tablet TAKE 1 TABLET BY MOUTH EVERY DAY  . furosemide (LASIX) 20 MG tablet TAKE 1 TABLET BY MOUTH AS NEEDED FOR LOWER EXTREMITY SWELLING.  . isosorbide mononitrate (IMDUR) 60 MG 24 hr tablet Take 3 tablets (180 mg total) by mouth daily.  Marland Kitchen lisinopril (ZESTRIL) 2.5 MG tablet TAKE 1 TABLET BY MOUTH EVERY DAY  . metoprolol tartrate (LOPRESSOR) 25 MG tablet TAKE 1.5 TABLETS (37.5 MG TOTAL) BY MOUTH 2 (TWO) TIMES DAILY.  . nitroGLYCERIN (NITRODUR - DOSED IN MG/24 HR) 0.1 mg/hr patch PLACE 1 PATCH ON THE  SKIN DAILY  . nitroGLYCERIN (NITROSTAT) 0.4 MG SL tablet Place 1 tablet (0.4 mg total) under the tongue every 5 (five) minutes as needed for chest pain.  . pantoprazole (PROTONIX) 40 MG tablet Take 40 mg by mouth daily.   . ranolazine (RANEXA) 1000 MG SR tablet TAKE 1  BY MOUTH TWICE DAILY  . vitamin B-12 (CYANOCOBALAMIN) 1000 MCG tablet Take 1,000 mcg by mouth daily.  Alveda Reasons 2.5 MG TABS tablet TAKE 1 TABLET BY MOUTH TWICE A DAY     Allergies:   Benadryl [diphenhydramine hcl (sleep)] and Levofloxacin   Social History   Socioeconomic History  . Marital status: Married    Spouse name: Not on file  . Number of  children: Not on file  . Years of education: Not on file  . Highest education level: Not on file  Occupational History  . Occupation: Retired  Scientific laboratory technician  . Financial resource strain: Not on file  . Food insecurity    Worry: Not on file    Inability: Not on file  . Transportation needs    Medical: Not on file    Non-medical: Not on file  Tobacco Use  . Smoking status: Never Smoker  . Smokeless tobacco: Never Used  Substance and Sexual Activity  . Alcohol use: No  . Drug use: No  . Sexual activity: Yes    Birth control/protection: None  Lifestyle  . Physical activity    Days per week: Not on file    Minutes per session: Not on file  . Stress: Not on file  Relationships  . Social Herbalist on phone: Not on file    Gets together: Not on file    Attends religious service: Not on file    Active member of club or organization: Not on file    Attends meetings of clubs or organizations: Not on file    Relationship status: Not on file  Other Topics Concern  . Not on file  Social History Narrative  . Not on file     Family History: The patient's family history includes Heart attack in his father; Heart failure in his mother. ROS:   Please see the history of present illness.    All 14 point review of systems negative except as described per history of present illness  EKGs/Labs/Other Studies Reviewed:      Recent Labs: No results found for requested labs within last 8760 hours.  Recent Lipid Panel    Component Value Date/Time   CHOL 96 (L) 09/14/2017 0840   TRIG 108 09/14/2017 0840   HDL 42 09/14/2017 0840   CHOLHDL 2.3 09/14/2017 0840   CHOLHDL 3.4 09/26/2016 0232   VLDL 21 09/26/2016 0232   LDLCALC 32 09/14/2017 0840    Physical Exam:    VS:  BP 114/66   Pulse (!) 54   Ht 5\' 5"  (1.651 m)   Wt 198 lb (89.8 kg)   SpO2 97%   BMI 32.95 kg/m     Wt Readings from Last 3 Encounters:  01/18/19 198 lb (89.8 kg)  08/11/18 196 lb (88.9 kg)   05/24/18 204 lb 12.8 oz (92.9 kg)     GEN:  Well nourished, well developed in no acute distress HEENT: Normal NECK: No JVD; No carotid bruits LYMPHATICS: No lymphadenopathy CARDIAC: RRR, no murmurs, no rubs, no gallops RESPIRATORY:  Clear to auscultation without rales, wheezing or rhonchi  ABDOMEN: Soft, non-tender, non-distended MUSCULOSKELETAL:  No edema; No deformity  SKIN: Warm and dry LOWER EXTREMITIES: no swelling NEUROLOGIC:  Alert and oriented x 3 PSYCHIATRIC:  Normal affect   ASSESSMENT:    1. S/P CABG x 4   2. Ischemic cardiomyopathy   3. Essential hypertension   4. Mixed hyperlipidemia    PLAN:    In order of problems listed above:  1. Coronary disease status post coronary artery bypass graft.  I will stop his Zestril and I will increase dose of Norvasc from 2.5 mg to 5 mg daily the goal is to get maximum antianginal benefits from this maneuver.  Rest of the medication will be the same.  He will take nitroglycerin on as-needed basis 2. Ischemic cardiomyopathy last echocardiogram showed preserved left ventricular ejection fraction 3. Essential hypertension blood pressure well controlled. 4. Dyslipidemia on high intensity statin which I will continue. 5. Psychological issues.  I advised him to seek help in psychotherapy.  He is reluctant he said he want to think it over.  I asked his wife to let me know if he decided to look for help we will give him some numbers.   Medication Adjustments/Labs and Tests Ordered: Current medicines are reviewed at length with the patient today.  Concerns regarding medicines are outlined above.  No orders of the defined types were placed in this encounter.  Medication changes: No orders of the defined types were placed in this encounter.   Signed, Park Liter, MD, [facility] 01/18/2019 2:45 PM    [facility]

## 2019-01-18 NOTE — Patient Instructions (Addendum)
    Medication Instructions:  Your physician has recommended you make the following change in your medication:  STOP LISINOPRIL INCREASE NORVASC TO 5 MG DAILY  If you need a refill on your cardiac medications before your next appointment, please call your pharmacy.   Lab work: None Ordered   Testing/Procedures: None Ordered  Follow-Up: At [facility], you and your health needs are our priority.  As part of our continuing mission to provide you with exceptional heart care, we have created designated Provider Care Teams.  These Care Teams include your primary Cardiologist (physician) and Advanced Practice Providers (APPs -  Physician Assistants and Nurse Practitioners) who all work together to provide you with the care you need, when you need it. You will need a follow up appointment in 3 months.  Please call our office 2 months in advance to schedule this appointment.  You may see No primary care provider on file. or another member of our [facility] Provider Team in [place]: Shirlee More, MD . Jyl Heinz, MD

## 2019-02-09 ENCOUNTER — Other Ambulatory Visit: Payer: Self-pay | Admitting: Cardiology

## 2019-02-09 MED ORDER — METOPROLOL TARTRATE 25 MG PO TABS: 37.5000 mg | ORAL_TABLET | Freq: Two times a day (BID) | ORAL | 0 refills | Status: DC

## 2019-02-09 NOTE — Telephone Encounter (Signed)
Metoprolol tartrate 25 mg (1.5 tablet twice daily refilled)  

## 2019-02-09 NOTE — Telephone Encounter (Signed)
°*  STAT* If patient is at the pharmacy, call can be transferred to refill team.   1. Which medications need to be refilled? (please list name of each medication and dose if known) metoprolol tartrate (LOPRESSOR) 25 MG tablet  2. Which pharmacy/location (including street and city if local pharmacy) is medication to be sent to  [id number] - [place], [address]  DEA #:  [id number]. Do they need a 30 day or 90 day supply?

## 2019-03-07 DIAGNOSIS — L578 Other skin changes due to chronic exposure to nonionizing radiation: Secondary | ICD-10-CM | POA: Diagnosis not present

## 2019-03-07 DIAGNOSIS — L821 Other seborrheic keratosis: Secondary | ICD-10-CM | POA: Diagnosis not present

## 2019-03-07 DIAGNOSIS — L570 Actinic keratosis: Secondary | ICD-10-CM | POA: Diagnosis not present

## 2019-03-09 ENCOUNTER — Telehealth: Payer: Self-pay

## 2019-03-09 NOTE — Telephone Encounter (Signed)
Patient and his wife stopped by the office after dentist appointment with Dr. Barbie Haggis. They stated that Dr. Barbie Haggis is going to be pulling his tooth or doing a root canal on him. They are trying to get this taken care of next week. Dr. Barbie Haggis said that you need to approve of this as well as stopping his Xarelto and possibly his ASA. This will need to be stopped starting the day before the procedure and a few days afterwards.

## 2019-03-09 NOTE — Telephone Encounter (Signed)
I did get a call today from Dr. Jenny Reichmann Menius. The patient was in his office for a tooth extraction and Dr. Golden Circle wanted to know if he was ok to get the tooth on xeralto and stated that the patient was reporting chest pain - I did review the patient last note when he saw Dr. Agustin Cree.   In my discussion, I recommended against the extraction especially  due to the report of chest pain. Per our records he is on Imdur 60mg  daily and Renexa 1000mg  SR.  I will notify Dr. Joycelyn Rua.

## 2019-03-13 NOTE — Telephone Encounter (Signed)
Pt's wife called again upset that extraction was denied, states pt has a bad toothache

## 2019-03-14 NOTE — Telephone Encounter (Signed)
Called patient informed him that per Dr. Agustin Cree he can hold xarelto for 24 hours before extraction. Patient verbally understands. No further questions.

## 2019-03-14 NOTE — Telephone Encounter (Signed)
Called patients wife back per dpr. She reports the patient wasn't having chest pain. She believes there was a misunderstanding, she reported he has nitro to take if he has chest pain but she denies him having any. He needs a tooth removed and needs advise on how to hold aspirin and xarelto before this. Will consult with Dr. Agustin Cree.

## 2019-03-14 NOTE — Telephone Encounter (Signed)
It is a small dose of Xarelto, we can hold it for 24 hours before dental extraction.

## 2019-03-26 ENCOUNTER — Other Ambulatory Visit: Payer: Self-pay | Admitting: Cardiology

## 2019-03-28 NOTE — Telephone Encounter (Signed)
Please send in refill

## 2019-03-31 ENCOUNTER — Other Ambulatory Visit: Payer: Self-pay

## 2019-03-31 MED ORDER — RANOLAZINE ER 1000 MG PO TB12: 1000.0000 mg | ORAL_TABLET | Freq: Two times a day (BID) | ORAL | 1 refills | Status: DC

## 2019-04-08 ENCOUNTER — Other Ambulatory Visit: Payer: Self-pay | Admitting: Cardiology

## 2019-04-11 ENCOUNTER — Other Ambulatory Visit: Payer: Self-pay

## 2019-04-11 ENCOUNTER — Ambulatory Visit (INDEPENDENT_AMBULATORY_CARE_PROVIDER_SITE_OTHER): Payer: Medicare Other | Admitting: Cardiology

## 2019-04-11 ENCOUNTER — Other Ambulatory Visit: Payer: Self-pay | Admitting: Cardiology

## 2019-04-11 ENCOUNTER — Encounter: Payer: Self-pay | Admitting: Cardiology

## 2019-04-11 VITALS — BP 120/62 | HR 55 | Ht 65.0 in | Wt 201.8 lb

## 2019-04-11 DIAGNOSIS — E782 Mixed hyperlipidemia: Secondary | ICD-10-CM | POA: Diagnosis not present

## 2019-04-11 DIAGNOSIS — Z951 Presence of aortocoronary bypass graft: Secondary | ICD-10-CM

## 2019-04-11 DIAGNOSIS — I255 Ischemic cardiomyopathy: Secondary | ICD-10-CM

## 2019-04-11 MED ORDER — FUROSEMIDE 20 MG PO TABS: ORAL_TABLET | ORAL | 1 refills | Status: DC

## 2019-04-11 MED ORDER — ATORVASTATIN CALCIUM 80 MG PO TABS: 80.0000 mg | ORAL_TABLET | Freq: Every day | ORAL | 1 refills | Status: DC

## 2019-04-11 MED ORDER — ATORVASTATIN CALCIUM 80 MG PO TABS: 80.0000 mg | ORAL_TABLET | Freq: Every day | ORAL | 2 refills | Status: DC

## 2019-04-11 MED ORDER — ISOSORBIDE MONONITRATE ER 60 MG PO TB24: 180.0000 mg | ORAL_TABLET | Freq: Every day | ORAL | 1 refills | Status: DC

## 2019-04-11 NOTE — Patient Instructions (Signed)
Medication Instructions:  Your physician recommends that you continue on your current medications as directed. Please refer to the Current Medication list given to you today.  *If you need a refill on your cardiac medications before your next appointment, please call your pharmacy*  Lab Work: Your physician recommends that you return for lab work today: cbc, cmp, tsh  If you have labs (blood work) drawn today and your tests are completely normal, you will receive your results only by: Marland Kitchen MyChart Message (if you have MyChart) OR . A paper copy in the mail If you have any lab test that is abnormal or we need to change your treatment, we will call you to review the results.  Testing/Procedures: Your physician has requested that you have an echocardiogram. Echocardiography is a painless test that uses sound waves to create images of your heart. It provides your doctor with information about the size and shape of your heart and how well your heart's chambers and valves are working. This procedure takes approximately one hour. There are no restrictions for this procedure.    Follow-Up: At [facility], you and your health needs are our priority.  As part of our continuing mission to provide you with exceptional heart care, we have created designated Provider Care Teams.  These Care Teams include your primary Cardiologist (physician) and Advanced Practice Providers (APPs -  Physician Assistants and Nurse Practitioners) who all work together to provide you with the care you need, when you need it.  Your next appointment:   4 month(s)  The format for your next appointment:   In Person  Provider:   Jenne Campus, MD  Other Instructions   Echocardiogram An echocardiogram is a procedure that uses painless sound waves (ultrasound) to produce an image of the heart. Images from an echocardiogram can provide important information about:  Signs of coronary artery disease (CAD).  Aneurysm  detection. An aneurysm is a weak or damaged part of an artery wall that bulges out from the normal force of blood pumping through the body.  Heart size and shape. Changes in the size or shape of the heart can be associated with certain conditions, including heart failure, aneurysm, and CAD.  Heart muscle function.  Heart valve function.  Signs of a past heart attack.  Fluid buildup around the heart.  Thickening of the heart muscle.  A tumor or infectious growth around the heart valves. Tell a health care provider about:  Any allergies you have.  All medicines you are taking, including vitamins, herbs, eye drops, creams, and over-the-counter medicines.  Any blood disorders you have.  Any surgeries you have had.  Any medical conditions you have.  Whether you are pregnant or may be pregnant. What are the risks? Generally, this is a safe procedure. However, problems may occur, including:  Allergic reaction to dye (contrast) that may be used during the procedure. What happens before the procedure? No specific preparation is needed. You may eat and drink normally. What happens during the procedure?   An IV tube may be inserted into one of your veins.  You may receive contrast through this tube. A contrast is an injection that improves the quality of the pictures from your heart.  A gel will be applied to your chest.  A wand-like tool (transducer) will be moved over your chest. The gel will help to transmit the sound waves from the transducer.  The sound waves will harmlessly bounce off of your heart to allow the heart  images to be captured in real-time motion. The images will be recorded on a computer. The procedure may vary among health care providers and hospitals. What happens after the procedure?  You may return to your normal, everyday life, including diet, activities, and medicines, unless your health care provider tells you not to do that. Summary  An  echocardiogram is a procedure that uses painless sound waves (ultrasound) to produce an image of the heart.  Images from an echocardiogram can provide important information about the size and shape of your heart, heart muscle function, heart valve function, and fluid buildup around your heart.  You do not need to do anything to prepare before this procedure. You may eat and drink normally.  After the echocardiogram is completed, you may return to your normal, everyday life, unless your health care provider tells you not to do that. This information is not intended to replace advice given to you by your health care provider. Make sure you discuss any questions you have with your health care provider. Document Released: 04/03/2000 Document Revised: 07/28/2018 Document Reviewed: 05/09/2016 Elsevier Patient Education  2020 [employer].

## 2019-04-11 NOTE — Progress Notes (Signed)
Cardiology Office Note:    Date:  04/11/2019   ID:  Nettie Facenda, DOB [birth year], MRN [id number]  PCP:  Myer Peer, MD  Cardiologist:  Jenne Campus, MD    Referring MD: Myer Peer, MD   Chief Complaint  Patient presents with  . Follow-up    History of Present Illness:    Jennis Botte is a 73 y.o. male complex past medical history coronary artery bypass graft shortly after he was discovered to occluded 3 out of 4 rest.  So far been doing fairly well with medication but gradually complaining of deterioration.  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  And described to have some chest pain from time to time take appropriate medications for it.  Past Medical History:  Diagnosis Date  . Anginal pain ([place])   . Arthritis   . Coronary artery disease   . Hyperlipidemia   . Hypertension   . Myocardial infarction [facility])     Past Surgical History:  Procedure Laterality Date  . CORONARY ARTERY BYPASS GRAFT N/A 09/28/2016   Procedure: CORONARY ARTERY BYPASS GRAFTING (CABG) x four, using left internal mammary artery and right leg greater saphenous vein harvested endoscopically;  Surgeon: Ivin Poot, MD;  Location: [place];  Service: Open Heart Surgery;  Laterality: N/A;  . IR THORACENTESIS ASP PLEURAL SPACE W/IMG GUIDE  10/06/2016  . LEFT HEART CATH AND CORONARY ANGIOGRAPHY N/A 09/24/2016   Procedure: Left Heart Cath and Coronary Angiography;  Surgeon: Lorretta Harp, MD;  Location: [place] CV LAB;  Service: Cardiovascular;  Laterality: N/A;  . LEFT HEART CATH AND CORS/GRAFTS ANGIOGRAPHY N/A 02/11/2017   Procedure: LEFT HEART CATH AND CORS/GRAFTS ANGIOGRAPHY;  Surgeon: Jettie Booze, MD;  Location: [place] CV LAB;  Service: Cardiovascular;  Laterality: N/A;  . TEE WITHOUT CARDIOVERSION N/A 09/28/2016   Procedure: TRANSESOPHAGEAL ECHOCARDIOGRAM (TEE);  Surgeon: Prescott Gum, Collier Salina, MD;  Location: [place];  Service: Open Heart Surgery;  Laterality:  N/A;  . ULTRASOUND GUIDANCE FOR VASCULAR ACCESS  02/11/2017   Procedure: Ultrasound Guidance For Vascular Access;  Surgeon: Jettie Booze, MD;  Location: [place] CV LAB;  Service: Cardiovascular;;    Current Medications: Current Meds  Medication Sig  . aspirin 81 MG EC tablet Take 1 tablet (81 mg total) by mouth daily.  Marland Kitchen atorvastatin (LIPITOR) 80 MG tablet Take 1 tablet (80 mg total) by mouth daily.  . furosemide (LASIX) 20 MG tablet TAKE 1 TABLET BY MOUTH AS NEEDED FOR LOWER EXTREMITY SWELLING.  . isosorbide mononitrate (IMDUR) 60 MG 24 hr tablet Take 3 tablets (180 mg total) by mouth daily.  . memantine (NAMENDA) 5 MG tablet Take 1 tablet by mouth 2 (two) times daily.   . nitroGLYCERIN (NITRODUR - DOSED IN MG/24 HR) 0.1 mg/hr patch PLACE 1 PATCH ON THE SKIN DAILY  . nitroGLYCERIN (NITROSTAT) 0.4 MG SL tablet PLACE 1 TABLET (0.4 MG TOTAL) UNDER THE TONGUE EVERY 5 (FIVE) MINUTES AS NEEDED FOR CHEST PAIN.  Marland Kitchen pantoprazole (PROTONIX) 40 MG tablet Take 40 mg by mouth daily.   . ranolazine (RANEXA) 1000 MG SR tablet Take 1 tablet (1,000 mg total) by mouth 2 (two) times daily.  . vitamin B-12 (CYANOCOBALAMIN) 1000 MCG tablet Take 1,000 mcg by mouth daily.  Alveda Reasons 2.5 MG TABS tablet TAKE 1 TABLET BY MOUTH TWICE A DAY     Allergies:   Benadryl [diphenhydramine hcl (sleep)] and Levofloxacin   Social History  Socioeconomic History  . Marital status: Married    Spouse name: Not on file  . Number of children: Not on file  . Years of education: Not on file  . Highest education level: Not on file  Occupational History  . Occupation: Retired  Tobacco Use  . Smoking status: Never Smoker  . Smokeless tobacco: Never Used  Substance and Sexual Activity  . Alcohol use: No  . Drug use: No  . Sexual activity: Yes    Birth control/protection: None  Other Topics Concern  . Not on file  Social History Narrative  . Not on file   Social Determinants of Health   Financial Resource  Strain:   . Difficulty of Paying Living Expenses: Not on file  Food Insecurity:   . Worried About Charity fundraiser in the Last Year: Not on file  . Ran Out of Food in the Last Year: Not on file  Transportation Needs:   . Lack of Transportation (Medical): Not on file  . Lack of Transportation (Non-Medical): Not on file  Physical Activity:   . Days of Exercise per Week: Not on file  . Minutes of Exercise per Session: Not on file  Stress:   . Feeling of Stress : Not on file  Social Connections:   . Frequency of Communication with Friends and Family: Not on file  . Frequency of Social Gatherings with Friends and Family: Not on file  . Attends Religious Services: Not on file  . Active Member of Clubs or Organizations: Not on file  . Attends Archivist Meetings: Not on file  . Marital Status: Not on file     Family History: The patient's family history includes Heart attack in his father; Heart failure in his mother. ROS:   Please see the history of present illness.    All 14 point review of systems negative except as described per history of present illness  EKGs/Labs/Other Studies Reviewed:      Recent Labs: No results found for requested labs within last 8760 hours.  Recent Lipid Panel    Component Value Date/Time   CHOL 96 (L) 09/14/2017 0840   TRIG 108 09/14/2017 0840   HDL 42 09/14/2017 0840   CHOLHDL 2.3 09/14/2017 0840   CHOLHDL 3.4 09/26/2016 0232   VLDL 21 09/26/2016 0232   LDLCALC 32 09/14/2017 0840    Physical Exam:    VS:  BP 120/62   Pulse (!) 55   Ht 5\' 5"  (1.651 m)   Wt 201 lb 12.8 oz (91.5 kg)   SpO2 96%   BMI 33.58 kg/m     Wt Readings from Last 3 Encounters:  04/11/19 201 lb 12.8 oz (91.5 kg)  01/18/19 198 lb (89.8 kg)  08/11/18 196 lb (88.9 kg)     GEN:  Well nourished, well developed in no acute distress HEENT: Normal NECK: No JVD; No carotid bruits LYMPHATICS: No lymphadenopathy CARDIAC: RRR, no murmurs, no rubs, no  gallops RESPIRATORY:  Clear to auscultation without rales, wheezing or rhonchi  ABDOMEN: Soft, non-tender, non-distended MUSCULOSKELETAL:  No edema; No deformity  SKIN: Warm and dry LOWER EXTREMITIES: no swelling NEUROLOGIC:  Alert and oriented x 3 PSYCHIATRIC:  Normal affect   ASSESSMENT:    1. Mixed hyperlipidemia   2. Ischemic cardiomyopathy   3. S/P CABG x 4    PLAN:    In order of problems listed above:  1. Mixed dyslipidemia.  We will continue present management which is aggressive.  2. Ischemic cardiomyopathy I will schedule him to have echocardiogram done to reassess left ventricle ejection fraction 3. Status post CABG with quick failure of 3 out of 4 grafts.  Doing fair from that point of view. 4. I am not sure exactly if his symptoms are related to his heart.  He has will do echocardiogram to assess ejection fraction but also will do complete metabolic panel TSH CBC.   Medication Adjustments/Labs and Tests Ordered: Current medicines are reviewed at length with the patient today.  Concerns regarding medicines are outlined above.  No orders of the defined types were placed in this encounter.  Medication changes: No orders of the defined types were placed in this encounter.   Signed, Park Liter, MD, [facility] 04/11/2019 2:32 PM    [place]

## 2019-04-12 LAB — COMPREHENSIVE METABOLIC PANEL
ALT: 20 IU/L (ref 0–44)
AST: 20 IU/L (ref 0–40)
Albumin/Globulin Ratio: 1.7 (ref 1.2–2.2)
Albumin: 3.9 g/dL (ref 3.7–4.7)
Alkaline Phosphatase: 74 IU/L (ref 39–117)
BUN/Creatinine Ratio: 14 (ref 10–24)
BUN: 19 mg/dL (ref 8–27)
Bilirubin Total: 0.7 mg/dL (ref 0.0–1.2)
CO2: 26 mmol/L (ref 20–29)
Calcium: 9 mg/dL (ref 8.6–10.2)
Chloride: 104 mmol/L (ref 96–106)
Creatinine, Ser: 1.39 mg/dL — ABNORMAL HIGH (ref 0.76–1.27)
GFR calc Af Amer: 59 mL/min/{1.73_m2} — ABNORMAL LOW (ref >59)
GFR calc non Af Amer: 51 mL/min/{1.73_m2} — ABNORMAL LOW (ref >59)
Globulin, Total: 2.3 g/dL (ref 1.5–4.5)
Glucose: 99 mg/dL (ref 65–99)
Potassium: 3.8 mmol/L (ref 3.5–5.2)
Sodium: 142 mmol/L (ref 134–144)
Total Protein: 6.2 g/dL (ref 6.0–8.5)

## 2019-04-12 LAB — CBC
Hematocrit: 41.5 % (ref 37.5–51.0)
Hemoglobin: 14.7 g/dL (ref 13.0–17.7)
MCH: 32.9 pg (ref 26.6–33.0)
MCHC: 35.4 g/dL (ref 31.5–35.7)
MCV: 93 fL (ref 79–97)
Platelets: 178 10*3/uL (ref 150–450)
RBC: 4.47 x10E6/uL (ref 4.14–5.80)
RDW: 12.8 % (ref 11.6–15.4)
WBC: 8.1 10*3/uL (ref 3.4–10.8)

## 2019-04-12 LAB — TSH: TSH: 2.33 u[IU]/mL (ref 0.450–4.500)

## 2019-06-04 DIAGNOSIS — Z7902 Long term (current) use of antithrombotics/antiplatelets: Secondary | ICD-10-CM | POA: Diagnosis not present

## 2019-06-04 DIAGNOSIS — G459 Transient cerebral ischemic attack, unspecified: Secondary | ICD-10-CM | POA: Diagnosis not present

## 2019-06-04 DIAGNOSIS — K219 Gastro-esophageal reflux disease without esophagitis: Secondary | ICD-10-CM | POA: Diagnosis not present

## 2019-06-04 DIAGNOSIS — R9431 Abnormal electrocardiogram [ECG] [EKG]: Secondary | ICD-10-CM | POA: Diagnosis not present

## 2019-06-04 DIAGNOSIS — N19 Unspecified kidney failure: Secondary | ICD-10-CM | POA: Diagnosis not present

## 2019-06-04 DIAGNOSIS — Z79899 Other long term (current) drug therapy: Secondary | ICD-10-CM | POA: Diagnosis not present

## 2019-06-04 DIAGNOSIS — I2510 Atherosclerotic heart disease of native coronary artery without angina pectoris: Secondary | ICD-10-CM | POA: Diagnosis not present

## 2019-06-04 DIAGNOSIS — R531 Weakness: Secondary | ICD-10-CM | POA: Diagnosis not present

## 2019-06-04 DIAGNOSIS — I10 Essential (primary) hypertension: Secondary | ICD-10-CM | POA: Diagnosis not present

## 2019-06-04 DIAGNOSIS — E785 Hyperlipidemia, unspecified: Secondary | ICD-10-CM

## 2019-06-04 DIAGNOSIS — Z951 Presence of aortocoronary bypass graft: Secondary | ICD-10-CM | POA: Diagnosis not present

## 2019-06-04 DIAGNOSIS — Z7982 Long term (current) use of aspirin: Secondary | ICD-10-CM | POA: Diagnosis not present

## 2019-06-04 DIAGNOSIS — R29810 Facial weakness: Secondary | ICD-10-CM | POA: Diagnosis not present

## 2019-06-04 DIAGNOSIS — I25810 Atherosclerosis of coronary artery bypass graft(s) without angina pectoris: Secondary | ICD-10-CM | POA: Diagnosis not present

## 2019-06-05 ENCOUNTER — Encounter: Payer: Self-pay | Admitting: Cardiology

## 2019-06-05 DIAGNOSIS — I361 Nonrheumatic tricuspid (valve) insufficiency: Secondary | ICD-10-CM | POA: Diagnosis not present

## 2019-06-05 DIAGNOSIS — N19 Unspecified kidney failure: Secondary | ICD-10-CM | POA: Diagnosis not present

## 2019-06-05 DIAGNOSIS — I25810 Atherosclerosis of coronary artery bypass graft(s) without angina pectoris: Secondary | ICD-10-CM | POA: Diagnosis not present

## 2019-06-05 DIAGNOSIS — G459 Transient cerebral ischemic attack, unspecified: Secondary | ICD-10-CM | POA: Diagnosis not present

## 2019-06-05 DIAGNOSIS — E785 Hyperlipidemia, unspecified: Secondary | ICD-10-CM | POA: Diagnosis not present

## 2019-06-05 DIAGNOSIS — K219 Gastro-esophageal reflux disease without esophagitis: Secondary | ICD-10-CM | POA: Diagnosis not present

## 2019-06-05 DIAGNOSIS — R531 Weakness: Secondary | ICD-10-CM | POA: Diagnosis not present

## 2019-06-08 ENCOUNTER — Other Ambulatory Visit: Payer: Medicare Other

## 2019-06-16 DIAGNOSIS — Z6831 Body mass index (BMI) 31.0-31.9, adult: Secondary | ICD-10-CM | POA: Diagnosis not present

## 2019-06-16 DIAGNOSIS — Z8673 Personal history of transient ischemic attack (TIA), and cerebral infarction without residual deficits: Secondary | ICD-10-CM | POA: Diagnosis not present

## 2019-06-16 DIAGNOSIS — Z1331 Encounter for screening for depression: Secondary | ICD-10-CM | POA: Diagnosis not present

## 2019-06-28 ENCOUNTER — Telehealth: Payer: Self-pay

## 2019-06-28 NOTE — Telephone Encounter (Signed)
Received order from PCP [name] MD for Monitor due to pt recent history TIA. Pt missed echo appt 06/08/19 due to hospitalization. Scheduled f/u appt with [facility] 07/27/19. Please advise if provider needs to request anything additional.

## 2019-06-28 NOTE — Telephone Encounter (Signed)
Right leg put a Zio patch for a week

## 2019-07-02 NOTE — Telephone Encounter (Signed)
TIA is the diagnosis

## 2019-07-05 NOTE — Telephone Encounter (Signed)
Try palpitations

## 2019-07-05 NOTE — Telephone Encounter (Signed)
Left message for patient to return call.

## 2019-07-07 NOTE — Telephone Encounter (Signed)
Patient's wife returning call. 

## 2019-07-07 NOTE — Telephone Encounter (Signed)
Called patient back. Spoke with patient's wife per dpr. Advised her we will be sending monitor for patient to wear for 7 days. She verbally understood, will register monitor.

## 2019-07-13 ENCOUNTER — Other Ambulatory Visit: Payer: Self-pay | Admitting: Cardiology

## 2019-07-13 ENCOUNTER — Ambulatory Visit (INDEPENDENT_AMBULATORY_CARE_PROVIDER_SITE_OTHER): Payer: Medicare Other

## 2019-07-13 DIAGNOSIS — R002 Palpitations: Secondary | ICD-10-CM | POA: Diagnosis not present

## 2019-07-18 ENCOUNTER — Other Ambulatory Visit: Payer: Self-pay | Admitting: Cardiology

## 2019-07-18 DIAGNOSIS — G4710 Hypersomnia, unspecified: Secondary | ICD-10-CM | POA: Diagnosis not present

## 2019-07-18 NOTE — Addendum Note (Signed)
Addended by: Mea Ozga, Jonelle Sidle L on: 07/18/2019 02:15 PM   Modules accepted: Orders

## 2019-07-18 NOTE — Telephone Encounter (Signed)
*  STAT* If patient is at the pharmacy, call can be transferred to refill team.   1. Which medications need to be refilled? (please list name of each medication and dose if known)  XARELTO 2.5 MG TABS tablet  2. Which pharmacy/location (including street and city if local pharmacy) is medication to be sent to? [id number] - [place], [place] - [address]  3. Do they need a 30 day or 90 day supply? 90 day supply

## 2019-07-21 MED ORDER — XARELTO 2.5 MG PO TABS: 2.5000 mg | ORAL_TABLET | Freq: Two times a day (BID) | ORAL | 1 refills | Status: DC

## 2019-07-21 NOTE — Telephone Encounter (Signed)
*  STAT* If patient is at the pharmacy, call can be transferred to refill team.   1. Which medications need to be refilled? (please list name of each medication and dose if known)   XARELTO 2.5 MG TABS tablet     2. Which pharmacy/location (including street and city if local pharmacy) is medication to be sent to? [id number] [place], [place] - [address]  3. Do they need a 30 day or 90 day supply? 90 day

## 2019-07-27 ENCOUNTER — Other Ambulatory Visit: Payer: Self-pay

## 2019-07-27 ENCOUNTER — Ambulatory Visit (INDEPENDENT_AMBULATORY_CARE_PROVIDER_SITE_OTHER): Payer: Medicare Other | Admitting: Cardiology

## 2019-07-27 ENCOUNTER — Telehealth: Payer: Self-pay | Admitting: Emergency Medicine

## 2019-07-27 ENCOUNTER — Encounter: Payer: Self-pay | Admitting: Cardiology

## 2019-07-27 VITALS — BP 116/60 | HR 58 | Temp 97.5°F | Ht 66.0 in | Wt 201.6 lb

## 2019-07-27 DIAGNOSIS — I25118 Atherosclerotic heart disease of native coronary artery with other forms of angina pectoris: Secondary | ICD-10-CM

## 2019-07-27 DIAGNOSIS — I255 Ischemic cardiomyopathy: Secondary | ICD-10-CM | POA: Diagnosis not present

## 2019-07-27 DIAGNOSIS — I10 Essential (primary) hypertension: Secondary | ICD-10-CM | POA: Diagnosis not present

## 2019-07-27 DIAGNOSIS — Z951 Presence of aortocoronary bypass graft: Secondary | ICD-10-CM | POA: Diagnosis not present

## 2019-07-27 DIAGNOSIS — Z8673 Personal history of transient ischemic attack (TIA), and cerebral infarction without residual deficits: Secondary | ICD-10-CM

## 2019-07-27 HISTORY — DX: Personal history of transient ischemic attack (TIA), and cerebral infarction without residual deficits: Z86.73

## 2019-07-27 MED ORDER — NITROGLYCERIN 0.4 MG SL SUBL: 0.4000 mg | SUBLINGUAL_TABLET | SUBLINGUAL | 11 refills | Status: DC | PRN

## 2019-07-27 NOTE — Telephone Encounter (Signed)
Left message for patient to return call. He needs to be informed of carotid ultrasound appointment when he returns call.

## 2019-07-27 NOTE — Telephone Encounter (Signed)
Patient called to confirm his appt.

## 2019-07-27 NOTE — Progress Notes (Signed)
Cardiology Office Note:    Date:  07/27/2019   ID:  Rykar Wittenborn, DOB 1945/07/23, MRN [id number]  PCP:  Myer Peer, MD  Cardiologist:  Jenne Campus, MD    Referring MD: Myer Peer, MD   No chief complaint on file. Have episode weakness  History of Present Illness:    Ernest Booker is a 74 y.o. male with complex past medical history.  That include coronary artery disease status post coronary bypass graft 2 years ago after that he had a patent all grafts occluded.  Managed medically since that time.  Overall doing rather thoroughly.  Recently he ended going to hospital because of episode profound weakness and sensation of facial drooping.  MRI being done showed no evidence of CVA.  He was discharged home with final diagnosis of TIA.  Since that time he seems to be doing well.  He is complaining still of having episode of profound weakness that happen without any warning.  It can last for few minutes for few hours.  Denies having any recent chest pain tightness squeezing pressure burning chest.  Past Medical History:  Diagnosis Date  . Angina pectoris ([place])   . Anginal pain ([place])   . Arthritis   . Atrial fibrillation ([place]) 09/15/2017  . Bilateral numbness and tingling of arms and legs 10/29/2015  . CAD (coronary artery disease) 09/28/2016  . Cardiogenic shock ([place])   . Cervical disc disease 10/29/2015  . Chest pain at rest 09/15/2017  . Chronic kidney disease (CKD), stage III (moderate) 09/15/2017  . Coronary artery disease   . Encounter for therapeutic drug monitoring 10/14/2016  . Hx of CABG   . Hyperlipemia 09/24/2016  . Hyperlipidemia   . Hypertension   . Ischemic cardiomyopathy 10/22/2016  . Myocardial infarction ([place])   . Neck pain 10/29/2015  . Non-ST elevation (NSTEMI) myocardial infarction ([place])   . NSTEMI (non-ST elevated myocardial infarction) ([place]) 09/24/2016  . Numbness in left leg 10/29/2015  . Paroxysmal tachycardia ([place])   . Pleural effusion   . Presence of  aortocoronary bypass graft 10/08/2016   LIMA to LAD -SVG to DIAG SVG to RAMUS SVG to PDA  All grafts occluded except LIMA based on cardiac catheterization 07/2016  . Respiratory failure ([place])   . S/P CABG x 4 10/08/2016   LIMA to LAD -SVG to DIAG SVG to RAMUS SVG to PDA  All grafts occluded except LIMA based on cardiac catheterization 07/2016  . Weakness of both arms 10/29/2015    Past Surgical History:  Procedure Laterality Date  . CORONARY ARTERY BYPASS GRAFT N/A 09/28/2016   Procedure: CORONARY ARTERY BYPASS GRAFTING (CABG) x four, using left internal mammary artery and right leg greater saphenous vein harvested endoscopically;  Surgeon: Ivin Poot, MD;  Location: [facility];  Service: Open Heart Surgery;  Laterality: N/A;  . IR THORACENTESIS ASP PLEURAL SPACE W/IMG GUIDE  10/06/2016  . LEFT HEART CATH AND CORONARY ANGIOGRAPHY N/A 09/24/2016   Procedure: Left Heart Cath and Coronary Angiography;  Surgeon: Lorretta Harp, MD;  Location: [place] [facility];  Service: Cardiovascular;  Laterality: N/A;  . LEFT HEART CATH AND CORS/GRAFTS ANGIOGRAPHY N/A 02/11/2017   Procedure: LEFT HEART CATH AND CORS/GRAFTS ANGIOGRAPHY;  Surgeon: Jettie Booze, MD;  Location: [place] [facility];  Service: Cardiovascular;  Laterality: N/A;  . TEE WITHOUT CARDIOVERSION N/A 09/28/2016   Procedure: TRANSESOPHAGEAL ECHOCARDIOGRAM (TEE);  Surgeon: Prescott Gum, Collier Salina, MD;  Location: [facility];  Service: Open Heart Surgery;  Laterality: N/A;  . ULTRASOUND GUIDANCE FOR VASCULAR ACCESS  02/11/2017   Procedure: Ultrasound Guidance For Vascular Access;  Surgeon: Jettie Booze, MD;  Location: [facility] [facility];  Service: Cardiovascular;;    Current Medications: Current Meds  Medication Sig  . amLODipine (NORVASC) 5 MG tablet Take 1 tablet (5 mg total) by mouth daily.  Marland Kitchen aspirin 81 MG EC tablet Take 1 tablet (81 mg total) by mouth daily.  Marland Kitchen atorvastatin (LIPITOR) 80 MG tablet Take 1 tablet (80 mg total) by mouth  daily.  . furosemide (LASIX) 20 MG tablet TAKE 1 TABLET BY MOUTH AS NEEDED FOR LOWER EXTREMITY SWELLING.  . isosorbide mononitrate (IMDUR) 60 MG 24 hr tablet Take 3 tablets (180 mg total) by mouth daily.  . memantine (NAMENDA) 5 MG tablet Take 1 tablet by mouth 2 (two) times daily.   . metoprolol tartrate (LOPRESSOR) 25 MG tablet Take 25 mg by mouth 2 (two) times daily.  . pantoprazole (PROTONIX) 40 MG tablet Take 40 mg by mouth daily.   . ranolazine (RANEXA) 1000 MG SR tablet Take 1 tablet (1,000 mg total) by mouth 2 (two) times daily.  . rivaroxaban (XARELTO) 2.5 MG TABS tablet Take 1 tablet (2.5 mg total) by mouth 2 (two) times daily.  . [DISCONTINUED] nitroGLYCERIN (NITRODUR - DOSED IN MG/24 HR) 0.1 mg/hr patch PLACE 1 PATCH ON THE SKIN DAILY     Allergies:   Benadryl [diphenhydramine hcl (sleep)] and Levofloxacin   Social History   Socioeconomic History  . Marital status: Married    Spouse name: Not on file  . Number of children: Not on file  . Years of education: Not on file  . Highest education level: Not on file  Occupational History  . Occupation: Retired  Tobacco Use  . Smoking status: Never Smoker  . Smokeless tobacco: Never Used  Substance and Sexual Activity  . Alcohol use: No  . Drug use: No  . Sexual activity: Yes    Birth control/protection: None  Other Topics Concern  . Not on file  Social History Narrative  . Not on file   Social Determinants of Health   Financial Resource Strain:   . Difficulty of Paying Living Expenses:   Food Insecurity:   . Worried About Charity fundraiser in the Last Year:   . Arboriculturist in the Last Year:   Transportation Needs:   . Film/video editor (Medical):   Marland Kitchen Lack of Transportation (Non-Medical):   Physical Activity:   . Days of Exercise per Week:   . Minutes of Exercise per Session:   Stress:   . Feeling of Stress :   Social Connections:   . Frequency of Communication with Friends and Family:   . Frequency  of Social Gatherings with Friends and Family:   . Attends Religious Services:   . Active Member of Clubs or Organizations:   . Attends Archivist Meetings:   Marland Kitchen Marital Status:      Family History: The patient's family history includes Heart attack in his father; Heart failure in his mother. ROS:   Please see the history of present illness.    All 14 point review of systems negative except as described per history of present illness  EKGs/Labs/Other Studies Reviewed:      Recent Labs: 04/11/2019: ALT 20; BUN 19; Creatinine, Ser 1.39; Hemoglobin 14.7; Platelets 178; Potassium 3.8; Sodium 142; TSH 2.330  Recent Lipid Panel    Component Value Date/Time  CHOL 96 (L) 09/14/2017 0840   TRIG 108 09/14/2017 0840   HDL 42 09/14/2017 0840   CHOLHDL 2.3 09/14/2017 0840   CHOLHDL 3.4 09/26/2016 0232   VLDL 21 09/26/2016 0232   LDLCALC 32 09/14/2017 0840    Physical Exam:    VS:  BP 116/60   Pulse (!) 58   Temp (!) 97.5 F (36.4 C)   Ht 5\' 6"  (1.676 m)   Wt 201 lb 9.6 oz (91.4 kg)   SpO2 95%   BMI 32.54 kg/m     Wt Readings from Last 3 Encounters:  07/27/19 201 lb 9.6 oz (91.4 kg)  04/11/19 201 lb 12.8 oz (91.5 kg)  01/18/19 198 lb (89.8 kg)     GEN:  Well nourished, well developed in no acute distress HEENT: Normal NECK: No JVD; No carotid bruits LYMPHATICS: No lymphadenopathy CARDIAC: RRR, no murmurs, no rubs, no gallops RESPIRATORY:  Clear to auscultation without rales, wheezing or rhonchi  ABDOMEN: Soft, non-tender, non-distended MUSCULOSKELETAL:  No edema; No deformity  SKIN: Warm and dry LOWER EXTREMITIES: no swelling NEUROLOGIC:  Alert and oriented x 3 PSYCHIATRIC:  Normal affect   ASSESSMENT:    1. Ischemic cardiomyopathy   2. Essential hypertension   3. Coronary artery disease of native artery of native heart with stable angina pectoris ([place])   4. S/P CABG x 4   5. History of TIA (transient ischemic attack)    PLAN:    In order of problems  listed above:  1. Ischemic cardiomyopathy.  Last echocardiogram reviewed from the hospital on June 05, 2019.  Showing ejection fraction being normal.  We will continue present management. 2. Essential hypertension blood pressure well controlled continue present management. 3. Coronary artery disease advanced stable we will continue present management. 4. History of TIA obviously very disturbing.  He does have history of paroxysmal atrial fibrillation but it happened shortly after bypass surgery.  He was on medication for 3 months then medication be discontinued and there were no more episode of atrial fibrillation.  I did review his echocardiogram from hospital which showed normal left atrial size.  He is already on Xarelto which is only vascular dose 2.5 twice daily.  The question of course is should increase dose of Xarelto to clopidogrel dose just in case he had recurrences of atrial fibrillation.  He did wear monitor, waiting for the results of the monitor.  Monitor will be unrevealing will place 2 choices.  Put him on full dose of anticoagulation assuming that what led to TIA is his atrial fibrillation that he had only documented episode after surgery, all we may put implantable loop recorder plan to see if he can identify anything that recurrences of atrial fibrillation.  I will talk to him after event recorder to decide what will be the choice that he prefers.   Medication Adjustments/Labs and Tests Ordered: Current medicines are reviewed at length with the patient today.  Concerns regarding medicines are outlined above.  No orders of the defined types were placed in this encounter.  Medication changes:  Meds ordered this encounter  Medications  . nitroGLYCERIN (NITROSTAT) 0.4 MG SL tablet    Sig: Place 1 tablet (0.4 mg total) under the tongue every 5 (five) minutes as needed for chest pain.    Dispense:  25 tablet    Refill:  11    Signed, Park Liter, MD, [facility] 07/27/2019  12:05 PM    Media

## 2019-07-27 NOTE — Patient Instructions (Signed)
Medication Instructions:  Your physician recommends that you continue on your current medications as directed. Please refer to the Current Medication list given to you today.  *If you need a refill on your cardiac medications before your next appointment, please call your pharmacy*   Lab Work: None If you have labs (blood work) drawn today and your tests are completely normal, you will receive your results only by: MyChart Message (if you have MyChart) OR A paper copy in the mail If you have any lab test that is abnormal or we need to change your treatment, we will call you to review the results.   Testing/Procedures: None   Follow-Up: At [facility], you and your health needs are our priority.  As part of our continuing mission to provide you with exceptional heart care, we have created designated Provider Care Teams.  These Care Teams include your primary Cardiologist (physician) and Advanced Practice Providers (APPs -  Physician Assistants and Nurse Practitioners) who all work together to provide you with the care you need, when you need it.  We recommend signing up for the patient portal called "MyChart".  Sign up information is provided on this After Visit Summary.  MyChart is used to connect with patients for Virtual Visits (Telemedicine).  Patients are able to view lab/test results, encounter notes, upcoming appointments, etc.  Non-urgent messages can be sent to your provider as well.   To learn more about what you can do with MyChart, go to [email or web address].    Your next appointment:   1 month(s)  The format for your next appointment:   In Person  Provider:   Robert Krasowski, MD   Other Instructions   

## 2019-07-31 DIAGNOSIS — R002 Palpitations: Secondary | ICD-10-CM | POA: Diagnosis not present

## 2019-08-02 ENCOUNTER — Other Ambulatory Visit: Payer: Self-pay | Admitting: Cardiology

## 2019-08-02 DIAGNOSIS — E785 Hyperlipidemia, unspecified: Secondary | ICD-10-CM | POA: Diagnosis not present

## 2019-08-02 DIAGNOSIS — Z7189 Other specified counseling: Secondary | ICD-10-CM | POA: Diagnosis not present

## 2019-08-02 DIAGNOSIS — G4733 Obstructive sleep apnea (adult) (pediatric): Secondary | ICD-10-CM | POA: Diagnosis not present

## 2019-08-02 DIAGNOSIS — I10 Essential (primary) hypertension: Secondary | ICD-10-CM | POA: Diagnosis not present

## 2019-08-02 DIAGNOSIS — R002 Palpitations: Secondary | ICD-10-CM

## 2019-08-02 DIAGNOSIS — Z6831 Body mass index (BMI) 31.0-31.9, adult: Secondary | ICD-10-CM | POA: Diagnosis not present

## 2019-08-02 DIAGNOSIS — R5383 Other fatigue: Secondary | ICD-10-CM | POA: Diagnosis not present

## 2019-08-02 DIAGNOSIS — Z79899 Other long term (current) drug therapy: Secondary | ICD-10-CM | POA: Diagnosis not present

## 2019-08-02 DIAGNOSIS — I25709 Atherosclerosis of coronary artery bypass graft(s), unspecified, with unspecified angina pectoris: Secondary | ICD-10-CM | POA: Diagnosis not present

## 2019-08-22 ENCOUNTER — Ambulatory Visit (INDEPENDENT_AMBULATORY_CARE_PROVIDER_SITE_OTHER): Payer: Medicare Other

## 2019-08-22 ENCOUNTER — Other Ambulatory Visit: Payer: Self-pay

## 2019-08-22 DIAGNOSIS — Z8673 Personal history of transient ischemic attack (TIA), and cerebral infarction without residual deficits: Secondary | ICD-10-CM | POA: Diagnosis not present

## 2019-08-22 DIAGNOSIS — I25118 Atherosclerotic heart disease of native coronary artery with other forms of angina pectoris: Secondary | ICD-10-CM | POA: Diagnosis not present

## 2019-08-22 NOTE — Progress Notes (Unsigned)
Carotid duplex exam has been performed.  Jimmy Charity Tessier RDCS, RVT 

## 2019-08-24 ENCOUNTER — Other Ambulatory Visit: Payer: Self-pay

## 2019-08-24 ENCOUNTER — Encounter: Payer: Self-pay | Admitting: Emergency Medicine

## 2019-08-24 ENCOUNTER — Ambulatory Visit (INDEPENDENT_AMBULATORY_CARE_PROVIDER_SITE_OTHER): Payer: Medicare Other | Admitting: Cardiology

## 2019-08-24 ENCOUNTER — Encounter: Payer: Self-pay | Admitting: Cardiology

## 2019-08-24 VITALS — BP 118/70 | HR 53 | Ht 66.0 in | Wt 201.0 lb

## 2019-08-24 DIAGNOSIS — I25118 Atherosclerotic heart disease of native coronary artery with other forms of angina pectoris: Secondary | ICD-10-CM

## 2019-08-24 DIAGNOSIS — Z8673 Personal history of transient ischemic attack (TIA), and cerebral infarction without residual deficits: Secondary | ICD-10-CM | POA: Diagnosis not present

## 2019-08-24 DIAGNOSIS — Z951 Presence of aortocoronary bypass graft: Secondary | ICD-10-CM

## 2019-08-24 NOTE — Progress Notes (Signed)
amb  

## 2019-08-24 NOTE — Progress Notes (Signed)
Cardiology Office Note:    Date:  08/24/2019   ID:  Ernest Booker, DOB 27-Jun-1945, MRN [id number]  PCP:  Myer Peer, MD  Cardiologist:  Jenne Campus, MD    Referring MD: Myer Peer, MD   No chief complaint on file. Still had edges of profound weakness and fatigue  History of Present Illness:    Ernest Booker is a 74 y.o. male  with complex past medical history.  That include coronary artery disease status post coronary bypass graft 2 years ago after that he had a patent all grafts occluded.  Managed medically since that time.  Overall doing rather thoroughly.  Recently he ended going to hospital because of episode profound weakness and sensation of facial drooping.  MRI being done showed no evidence of CVA.  He was discharged home with final diagnosis of TIA.  Since that time he seems to be doing well.  He is complaining still of having episode of profound weakness that happen without any warning He comes today to discuss results of his test. I did carotic ultrasound to rule out significant coronary arterial disease and he does not have any, he did wear Zio patch for week which showed only some narrow complex tachycardia but no clear-cut evidence of atrial fibrillation. I brought him back to the office to talk about options for work-up for TIA/CVA. We talked in length about options with options being waiting and see what happened, I am augmenting his anticoagulation he is taking 2.5 mg Xarelto twice daily which is a vascular dose dose option is to upgrade to 20 mg daily, option #3 is to pursue implantable loop recorder. After long today they decided to proceed with implantable loop recorder.  Past Medical History:  Diagnosis Date  . Angina pectoris ([place])   . Anginal pain ([place])   . Arthritis   . Atrial fibrillation ([place]) 09/15/2017  . Bilateral numbness and tingling of arms and legs 10/29/2015  . CAD (coronary artery disease) 09/28/2016  . Cardiogenic shock ([place])   . Cervical disc  disease 10/29/2015  . Chest pain at rest 09/15/2017  . Chronic kidney disease (CKD), stage III (moderate) 09/15/2017  . Coronary artery disease   . Encounter for therapeutic drug monitoring 10/14/2016  . Hx of CABG   . Hyperlipemia 09/24/2016  . Hyperlipidemia   . Hypertension   . Ischemic cardiomyopathy 10/22/2016  . Myocardial infarction ([place])   . Neck pain 10/29/2015  . Non-ST elevation (NSTEMI) myocardial infarction ([place])   . NSTEMI (non-ST elevated myocardial infarction) ([place]) 09/24/2016  . Numbness in left leg 10/29/2015  . Paroxysmal tachycardia ([place])   . Pleural effusion   . Presence of aortocoronary bypass graft 10/08/2016   LIMA to LAD -SVG to DIAG SVG to RAMUS SVG to PDA  All grafts occluded except LIMA based on cardiac catheterization 07/2016  . Respiratory failure ([place])   . S/P CABG x 4 10/08/2016   LIMA to LAD -SVG to DIAG SVG to RAMUS SVG to PDA  All grafts occluded except LIMA based on cardiac catheterization 07/2016  . Weakness of both arms 10/29/2015    Past Surgical History:  Procedure Laterality Date  . CORONARY ARTERY BYPASS GRAFT N/A 09/28/2016   Procedure: CORONARY ARTERY BYPASS GRAFTING (CABG) x four, using left internal mammary artery and right leg greater saphenous vein harvested endoscopically;  Surgeon: Ivin Poot, MD;  Location: [place];  Service: Open Heart Surgery;  Laterality: N/A;  . IR THORACENTESIS ASP PLEURAL SPACE  W/IMG GUIDE  10/06/2016  . LEFT HEART CATH AND CORONARY ANGIOGRAPHY N/A 09/24/2016   Procedure: Left Heart Cath and Coronary Angiography;  Surgeon: Lorretta Harp, MD;  Location: [place] [facility];  Service: Cardiovascular;  Laterality: N/A;  . LEFT HEART CATH AND CORS/GRAFTS ANGIOGRAPHY N/A 02/11/2017   Procedure: LEFT HEART CATH AND CORS/GRAFTS ANGIOGRAPHY;  Surgeon: Jettie Booze, MD;  Location: [place] [facility];  Service: Cardiovascular;  Laterality: N/A;  . TEE WITHOUT CARDIOVERSION N/A 09/28/2016   Procedure: TRANSESOPHAGEAL  ECHOCARDIOGRAM (TEE);  Surgeon: Prescott Gum, Collier Salina, MD;  Location: [place];  Service: Open Heart Surgery;  Laterality: N/A;  . ULTRASOUND GUIDANCE FOR VASCULAR ACCESS  02/11/2017   Procedure: Ultrasound Guidance For Vascular Access;  Surgeon: Jettie Booze, MD;  Location: [place] [facility];  Service: Cardiovascular;;    Current Medications: Current Meds  Medication Sig  . amLODipine (NORVASC) 5 MG tablet Take 1 tablet (5 mg total) by mouth daily.  Marland Kitchen aspirin 81 MG EC tablet Take 1 tablet (81 mg total) by mouth daily.  Marland Kitchen atorvastatin (LIPITOR) 80 MG tablet Take 1 tablet (80 mg total) by mouth daily.  . furosemide (LASIX) 20 MG tablet TAKE 1 TABLET BY MOUTH AS NEEDED FOR LOWER EXTREMITY SWELLING.  . isosorbide mononitrate (IMDUR) 60 MG 24 hr tablet Take 3 tablets (180 mg total) by mouth daily.  . memantine (NAMENDA) 5 MG tablet Take 1 tablet by mouth 2 (two) times daily.   . metoprolol tartrate (LOPRESSOR) 25 MG tablet Take 25 mg by mouth 2 (two) times daily.  . nitroGLYCERIN (NITROSTAT) 0.4 MG SL tablet Place 1 tablet (0.4 mg total) under the tongue every 5 (five) minutes as needed for chest pain.  . pantoprazole (PROTONIX) 40 MG tablet Take 40 mg by mouth daily.   . ranolazine (RANEXA) 1000 MG SR tablet Take 1 tablet (1,000 mg total) by mouth 2 (two) times daily.  . rivaroxaban (XARELTO) 2.5 MG TABS tablet Take 1 tablet (2.5 mg total) by mouth 2 (two) times daily.     Allergies:   Benadryl [diphenhydramine hcl (sleep)] and Levofloxacin   Social History   Socioeconomic History  . Marital status: Married    Spouse name: Not on file  . Number of children: Not on file  . Years of education: Not on file  . Highest education level: Not on file  Occupational History  . Occupation: Retired  Tobacco Use  . Smoking status: Never Smoker  . Smokeless tobacco: Never Used  Substance and Sexual Activity  . Alcohol use: No  . Drug use: No  . Sexual activity: Yes    Birth  control/protection: None  Other Topics Concern  . Not on file  Social History Narrative  . Not on file   Social Determinants of Health   Financial Resource Strain:   . Difficulty of Paying Living Expenses:   Food Insecurity:   . Worried About Charity fundraiser in the Last Year:   . Arboriculturist in the Last Year:   Transportation Needs:   . Film/video editor (Medical):   Marland Kitchen Lack of Transportation (Non-Medical):   Physical Activity:   . Days of Exercise per Week:   . Minutes of Exercise per Session:   Stress:   . Feeling of Stress :   Social Connections:   . Frequency of Communication with Friends and Family:   . Frequency of Social Gatherings with Friends and Family:   . Attends  Religious Services:   . Active Member of Clubs or Organizations:   . Attends Archivist Meetings:   Marland Kitchen Marital Status:      Family History: The patient's family history includes Heart attack in his father; Heart failure in his mother. ROS:   Please see the history of present illness.    All 14 point review of systems negative except as described per history of present illness  EKGs/Labs/Other Studies Reviewed:      Recent Labs: 04/11/2019: ALT 20; BUN 19; Creatinine, Ser 1.39; Hemoglobin 14.7; Platelets 178; Potassium 3.8; Sodium 142; TSH 2.330  Recent Lipid Panel    Component Value Date/Time   CHOL 96 (L) 09/14/2017 0840   TRIG 108 09/14/2017 0840   HDL 42 09/14/2017 0840   CHOLHDL 2.3 09/14/2017 0840   CHOLHDL 3.4 09/26/2016 0232   VLDL 21 09/26/2016 0232   LDLCALC 32 09/14/2017 0840    Physical Exam:    VS:  BP 118/70   Pulse (!) 53   Ht 5\' 6"  (1.676 m)   Wt 201 lb (91.2 kg)   SpO2 93%   BMI 32.44 kg/m     Wt Readings from Last 3 Encounters:  08/24/19 201 lb (91.2 kg)  07/27/19 201 lb 9.6 oz (91.4 kg)  04/11/19 201 lb 12.8 oz (91.5 kg)     GEN:  Well nourished, well developed in no acute distress HEENT: Normal NECK: No JVD; No carotid  bruits LYMPHATICS: No lymphadenopathy CARDIAC: RRR, no murmurs, no rubs, no gallops RESPIRATORY:  Clear to auscultation without rales, wheezing or rhonchi  ABDOMEN: Soft, non-tender, non-distended MUSCULOSKELETAL:  No edema; No deformity  SKIN: Warm and dry LOWER EXTREMITIES: no swelling NEUROLOGIC:  Alert and oriented x 3 PSYCHIATRIC:  Normal affect   ASSESSMENT:    1. Coronary artery disease of native artery of native heart with stable angina pectoris ([place])   2. S/P CABG x 4   3. History of TIA (transient ischemic attack)    PLAN:    In order of problems listed above:  1. Coronary disease stable from that point of view he does have episode profound weakness and fatigue we did talk in length about what could be the option explanation for it. I will call primary care physician to get all his laboratory test. I do have some thyroid checked in January which seems to be normal. Status post coronary artery bypass graft years ago. All grafts are occluded except LIMA. Seems to be hemodynamically stable and doing well from that point review. History of TIA: Plan as outlined above. Patient agreed to proceed with implantable loop recorder. Procedure explained to him including all risk benefits as well as alternatives. He will be scheduled to see our EP colleagues for discussion about that. Dyslipidemia. He is taking high intensity statin already. His last fasting profile have show LDL of 43 and HDL 42. We will continue this management.  Medication Adjustments/Labs and Tests Ordered: Current medicines are reviewed at length with the patient today.  Concerns regarding medicines are outlined above.  No orders of the defined types were placed in this encounter.  Medication changes: No orders of the defined types were placed in this encounter.   Signed, Park Liter, MD, [facility] 08/24/2019 10:15 AM    [place]

## 2019-08-24 NOTE — Patient Instructions (Signed)
Medication Instructions:  Your physician recommends that you continue on your current medications as directed. Please refer to the Current Medication list given to you today.  *If you need a refill on your cardiac medications before your next appointment, please call your pharmacy*   Lab Work: None.  If you have labs (blood work) drawn today and your tests are completely normal, you will receive your results only by: Marland Kitchen MyChart Message (if you have MyChart) OR . A paper copy in the mail If you have any lab test that is abnormal or we need to change your treatment, we will call you to review the results.   Testing/Procedures: None.    Follow-Up: At [facility], you and your health needs are our priority.  As part of our continuing mission to provide you with exceptional heart care, we have created designated Provider Care Teams.  These Care Teams include your primary Cardiologist (physician) and Advanced Practice Providers (APPs -  Physician Assistants and Nurse Practitioners) who all work together to provide you with the care you need, when you need it.  We recommend signing up for the patient portal called "MyChart".  Sign up information is provided on this After Visit Summary.  MyChart is used to connect with patients for Virtual Visits (Telemedicine).  Patients are able to view lab/test results, encounter notes, upcoming appointments, etc.  Non-urgent messages can be sent to your provider as well.   To learn more about what you can do with MyChart, go to [email or web address].    Your next appointment:   3 month(s)  The format for your next appointment:   In Person  Provider:   Jenne Campus, MD   Other Instructions   Dr. Agustin Cree has referred you to electrophysiology.

## 2019-09-05 DIAGNOSIS — L821 Other seborrheic keratosis: Secondary | ICD-10-CM | POA: Diagnosis not present

## 2019-09-05 DIAGNOSIS — L570 Actinic keratosis: Secondary | ICD-10-CM | POA: Diagnosis not present

## 2019-09-05 DIAGNOSIS — C44622 Squamous cell carcinoma of skin of right upper limb, including shoulder: Secondary | ICD-10-CM | POA: Diagnosis not present

## 2019-09-05 DIAGNOSIS — L578 Other skin changes due to chronic exposure to nonionizing radiation: Secondary | ICD-10-CM | POA: Diagnosis not present

## 2019-09-09 DIAGNOSIS — R0600 Dyspnea, unspecified: Secondary | ICD-10-CM | POA: Diagnosis not present

## 2019-09-09 DIAGNOSIS — R05 Cough: Secondary | ICD-10-CM | POA: Diagnosis not present

## 2019-09-09 DIAGNOSIS — R0981 Nasal congestion: Secondary | ICD-10-CM | POA: Diagnosis not present

## 2019-09-09 DIAGNOSIS — I2510 Atherosclerotic heart disease of native coronary artery without angina pectoris: Secondary | ICD-10-CM | POA: Diagnosis not present

## 2019-09-14 DIAGNOSIS — C44612 Basal cell carcinoma of skin of right upper limb, including shoulder: Secondary | ICD-10-CM | POA: Diagnosis not present

## 2019-09-21 ENCOUNTER — Other Ambulatory Visit: Payer: Self-pay | Admitting: Cardiology

## 2019-09-25 DIAGNOSIS — L02413 Cutaneous abscess of right upper limb: Secondary | ICD-10-CM | POA: Diagnosis not present

## 2019-09-28 ENCOUNTER — Ambulatory Visit (INDEPENDENT_AMBULATORY_CARE_PROVIDER_SITE_OTHER): Payer: Medicare Other | Admitting: Cardiology

## 2019-09-28 ENCOUNTER — Encounter: Payer: Self-pay | Admitting: Cardiology

## 2019-09-28 ENCOUNTER — Other Ambulatory Visit: Payer: Self-pay

## 2019-09-28 VITALS — BP 114/68 | HR 55 | Ht 66.0 in | Wt 202.4 lb

## 2019-09-28 DIAGNOSIS — I639 Cerebral infarction, unspecified: Secondary | ICD-10-CM | POA: Diagnosis not present

## 2019-09-28 NOTE — Progress Notes (Signed)
Electrophysiology Office Note   Date:  09/28/2019   ID:  Davione Lenker, DOB 1946-01-03, MRN [id number]  PCP:  [name], MD  Cardiologist: Agustin Cree Primary Electrophysiologist:  Akanksha Bellmore Meredith Leeds, MD    Chief Complaint: TIA   History of Present Illness: Gregor Dershem is a 74 y.o. male who is being seen today for the evaluation of TIA at the request of Park Liter, MD. Presenting today for electrophysiology evaluation.  He has a history of coronary artery disease status post CABG 2 years ago.  He recently went to the hospital because of facial drooping and was diagnosed with a TIA.  He wore a cardiac monitor that showed no major abnormalities and an echo that showed a normal ejection fraction.  Today, he denies symptoms of palpitations, chest pain, shortness of breath, orthopnea, PND, lower extremity edema, claudication, dizziness, presyncope, syncope, bleeding, or neurologic sequela. The patient is tolerating medications without difficulties.  He has had no further neurologic symptoms.  He has no chest pain or shortness of breath.  He is able to do all of his daily activities.   Past Medical History:  Diagnosis Date  . Angina pectoris ([place])   . Anginal pain ([place])   . Arthritis   . Atrial fibrillation ([place]) 09/15/2017  . Bilateral numbness and tingling of arms and legs 10/29/2015  . CAD (coronary artery disease) 09/28/2016  . Cardiogenic shock ([place])   . Cervical disc disease 10/29/2015  . Chest pain at rest 09/15/2017  . Chronic kidney disease (CKD), stage III (moderate) 09/15/2017  . Coronary artery disease   . Encounter for therapeutic drug monitoring 10/14/2016  . Hx of CABG   . Hyperlipemia 09/24/2016  . Hyperlipidemia   . Hypertension   . Ischemic cardiomyopathy 10/22/2016  . Myocardial infarction ([place])   . Neck pain 10/29/2015  . Non-ST elevation (NSTEMI) myocardial infarction ([place])   . NSTEMI (non-ST elevated myocardial infarction) ([place]) 09/24/2016  . Numbness in  left leg 10/29/2015  . Paroxysmal tachycardia ([place])   . Pleural effusion   . Presence of aortocoronary bypass graft 10/08/2016   LIMA to LAD -SVG to DIAG SVG to RAMUS SVG to PDA  All grafts occluded except LIMA based on cardiac catheterization 07/2016  . Respiratory failure ([place])   . S/P CABG x 4 10/08/2016   LIMA to LAD -SVG to DIAG SVG to RAMUS SVG to PDA  All grafts occluded except LIMA based on cardiac catheterization 07/2016  . Weakness of both arms 10/29/2015   Past Surgical History:  Procedure Laterality Date  . CORONARY ARTERY BYPASS GRAFT N/A 09/28/2016   Procedure: CORONARY ARTERY BYPASS GRAFTING (CABG) x four, using left internal mammary artery and right leg greater saphenous vein harvested endoscopically;  Surgeon: Ivin Poot, MD;  Location: [facility];  Service: Open Heart Surgery;  Laterality: N/A;  . IR THORACENTESIS ASP PLEURAL SPACE W/IMG GUIDE  10/06/2016  . LEFT HEART CATH AND CORONARY ANGIOGRAPHY N/A 09/24/2016   Procedure: Left Heart Cath and Coronary Angiography;  Surgeon: Lorretta Harp, MD;  Location: [facility];  Service: Cardiovascular;  Laterality: N/A;  . LEFT HEART CATH AND CORS/GRAFTS ANGIOGRAPHY N/A 02/11/2017   Procedure: LEFT HEART CATH AND CORS/GRAFTS ANGIOGRAPHY;  Surgeon: Jettie Booze, MD;  Location: [facility];  Service: Cardiovascular;  Laterality: N/A;  . TEE WITHOUT CARDIOVERSION N/A 09/28/2016   Procedure: TRANSESOPHAGEAL ECHOCARDIOGRAM (TEE);  Surgeon: Prescott Gum, Collier Salina, MD;  Location: [facility];  Service: Open Heart  Surgery;  Laterality: N/A;  . ULTRASOUND GUIDANCE FOR VASCULAR ACCESS  02/11/2017   Procedure: Ultrasound Guidance For Vascular Access;  Surgeon: Jettie Booze, MD;  Location: [facility];  Service: Cardiovascular;;     Current Outpatient Medications  Medication Sig Dispense Refill  . amLODipine (NORVASC) 5 MG tablet Take 1 tablet (5 mg total) by mouth daily. 30 tablet 3  . aspirin 81 MG EC tablet Take 1  tablet (81 mg total) by mouth daily. 30 tablet 11  . atorvastatin (LIPITOR) 80 MG tablet Take 1 tablet (80 mg total) by mouth daily. 90 tablet 1  . furosemide (LASIX) 20 MG tablet TAKE 1 TABLET BY MOUTH AS NEEDED FOR LOWER EXTREMITY SWELLING. 90 tablet 1  . isosorbide mononitrate (IMDUR) 60 MG 24 hr tablet Take 3 tablets (180 mg total) by mouth daily. 270 tablet 1  . memantine (NAMENDA) 5 MG tablet Take 1 tablet by mouth 2 (two) times daily.     . metoprolol tartrate (LOPRESSOR) 25 MG tablet Take 25 mg by mouth 2 (two) times daily.    . nitroGLYCERIN (NITROSTAT) 0.4 MG SL tablet Place 1 tablet (0.4 mg total) under the tongue every 5 (five) minutes as needed for chest pain. 25 tablet 11  . pantoprazole (PROTONIX) 40 MG tablet Take 40 mg by mouth daily.   3  . ranolazine (RANEXA) 1000 MG SR tablet Take 1 tablet (1,000 mg total) by mouth 2 (two) times daily. 180 tablet 1  . rivaroxaban (XARELTO) 2.5 MG TABS tablet Take 1 tablet (2.5 mg total) by mouth 2 (two) times daily. 180 tablet 1   No current facility-administered medications for this visit.    Allergies:   Benadryl [diphenhydramine hcl (sleep)] and Levofloxacin   Social History:  The patient  reports that he has never smoked. He has never used smokeless tobacco. He reports that he does not drink alcohol and does not use drugs.   Family History:  The patient's family history includes Heart attack in his father; Heart failure in his mother.    ROS:  Please see the history of present illness.   Otherwise, review of systems is positive for none.   All other systems are reviewed and negative.    PHYSICAL EXAM: VS:  BP 114/68   Pulse (!) 55   Ht 5\' 6"  (1.676 m)   Wt 202 lb 6.4 oz (91.8 kg)   SpO2 93%   BMI 32.67 kg/m  , BMI Body mass index is 32.67 kg/m. GEN: Well nourished, well developed, in no acute distress  HEENT: normal  Neck: no JVD, carotid bruits, or masses Cardiac: RRR; no murmurs, rubs, or gallops,no edema  Respiratory:   clear to auscultation bilaterally, normal work of breathing GI: soft, nontender, nondistended, + BS MS: no deformity or atrophy  Skin: warm and dry Neuro:  Strength and sensation are intact Psych: euthymic mood, full affect  EKG:  EKG is ordered today. Personal review of the ekg ordered shows sinus rhythm, rate 55  Recent Labs: 04/11/2019: ALT 20; BUN 19; Creatinine, Ser 1.39; Hemoglobin 14.7; Platelets 178; Potassium 3.8; Sodium 142; TSH 2.330    Lipid Panel     Component Value Date/Time   CHOL 96 (L) 09/14/2017 0840   TRIG 108 09/14/2017 0840   HDL 42 09/14/2017 0840   CHOLHDL 2.3 09/14/2017 0840   CHOLHDL 3.4 09/26/2016 0232   VLDL 21 09/26/2016 0232   LDLCALC 32 09/14/2017 0840     Wt Readings from  Last 3 Encounters:  09/28/19 202 lb 6.4 oz (91.8 kg)  08/24/19 201 lb (91.2 kg)  07/27/19 201 lb 9.6 oz (91.4 kg)      Other studies Reviewed: Additional studies/ records that were reviewed today include: TTE 06/05/2019 Review of the above records today demonstrates:  Ejection fraction 55 to 60%  Cardiac monitor 08/13/2019 personally reviewed Multiple trigger events showing normal sinus rhythm. 6 episode of SVT.   ASSESSMENT AND PLAN:  1.  History of TIA: No obvious source at this time.  It is certainly possible that atrial fibrillation is the cause of his stroke.  We Idalia Allbritton plan for Linq monitor implant.  Echo is without abnormality and cardiac monitor shows no evidence of atrial fibrillation.  Risks and benefits of Linq monitor were discussed and include bleeding and infection.  The patient understand these risks and is agreed to the procedure.  2.  Coronary artery disease status post CABG: No current chest pain.  Continue with current management.  Case discussed with referring cardiologist  Current medicines are reviewed at length with the patient today.   The patient does not have concerns regarding his medicines.  The following changes were made today:   none  Labs/ tests ordered today include:  Orders Placed This Encounter  Procedures  . EKG 12-Lead     Disposition:   FU with Taj Arteaga pending link monitor results  Signed, Leila Schuff Meredith Leeds, MD  09/28/2019 11:53 AM     [address] [place] [address] [phone] (office) [phone] (fax)  SURGEON:  Allegra Lai, MD     PREPROCEDURE DIAGNOSIS:  Cryptogenic Stroke    POSTPROCEDURE DIAGNOSIS:  Cryptogenic Stroke     PROCEDURES:   1. Implantable loop recorder implantation    INTRODUCTION:  Kollin Udell is a 74 y.o. male with a history of unexplained stroke who presents today for implantable loop implantation.  The patient has had a cryptogenic stroke.  Despite an extensive workup by neurology, no reversible causes have been identified.  he has worn telemetry during which he did not have arrhythmias.  There is significant concern for possible atrial fibrillation as the cause for the patients stroke.  The patient therefore presents today for implantable loop implantation.     DESCRIPTION OF PROCEDURE:  Informed written consent was obtained, and the patient was brought to the electrophysiology lab in a fasting state.  The patient required no sedation for the procedure today.  Mapping over the patient's chest was performed by the EP lab staff to identify the area where electrograms were most prominent for ILR recording.  This area was found to be the left parasternal region over the 3rd-4th intercostal space. The patients left chest was therefore prepped and draped in the usual sterile fashion by the EP lab staff. The skin overlying the left parasternal region was infiltrated with lidocaine for local analgesia.  A 0.5-cm incision was made over the left parasternal region over the 3rd intercostal space.  A subcutaneous ILR pocket was fashioned using a combination of sharp and blunt dissection.  A Medtronic Reveal [place] model [id number]  implantable loop recorder was then placed into the pocket  R waves were very prominent and measured 0.61mV. EBL<1 ml.  Steri- Strips and a sterile dressing were then applied.  There were no early apparent complications.     CONCLUSIONS:   1. Successful implantation of a Medtronic Reveal LINQ implantable loop recorder for cryptogenic stroke  2. No  early apparent complications.

## 2019-09-28 NOTE — Patient Instructions (Signed)
Medication Instructions:  Your physician recommends that you continue on your current medications as directed. Please refer to the Current Medication list given to you today.  *If you need a refill on your cardiac medications before your next appointment, please call your pharmacy*   Lab Work: None ordered   Testing/Procedures: None ordered   Follow-Up: At [facility], you and your health needs are our priority.  As part of our continuing mission to provide you with exceptional heart care, we have created designated Provider Care Teams.  These Care Teams include your primary Cardiologist (physician) and Advanced Practice Providers (APPs -  Physician Assistants and Nurse Practitioners) who all work together to provide you with the care you need, when you need it.  We recommend signing up for the patient portal called "MyChart".  Sign up information is provided on this After Visit Summary.  MyChart is used to connect with patients for Virtual Visits (Telemedicine).  Patients are able to view lab/test results, encounter notes, upcoming appointments, etc.  Non-urgent messages can be sent to your provider as well.   To learn more about what you can do with MyChart, go to [email or web address].    Your next appointment:   7- 10 day(s)  The format for your next appointment:   In Person  Provider:   device clinic for wound check   You will see Dr. Curt Bears as needed.  Thank you for choosing [facility]!!   Trinidad Curet, RN [phone]    Other Instructions   Implantable Loop Recorder Placement  An implantable loop recorder is a small electronic device that is placed under the skin of your chest. It is about the size of an AA ("double A") battery. The device records the electrical activity of your heart over a long period of time. Your health care provider can download these recordings to monitor your heart. You may need an implantable loop recorder if you have periods  of abnormal heart activity (arrhythmias) or unexplained fainting (syncope). The recorder can be left in place for 1 year or longer. Tell a health care provider about:  Any allergies you have.  All medicines you are taking, including vitamins, herbs, eye drops, creams, and over-the-counter medicines.  Any problems you or family members have had with anesthetic medicines.  Any blood disorders you have.  Any surgeries you have had.  Any medical conditions you have.  Whether you are pregnant or may be pregnant. What are the risks? Generally, this is a safe procedure. However, problems may occur, including:  Infection.  Bleeding.  Allergic reactions to anesthetic medicines.  Damage to nerves or blood vessels.  Failure of the device to work. This could require another surgery to replace it. What happens before the procedure?   You may have a physical exam, blood tests, and imaging tests of your heart, such as a chest X-ray.  Follow instructions from your health care provider about eating or drinking restrictions.  Ask your health care provider about: ? Changing or stopping your regular medicines. This is especially important if you are taking diabetes medicines or blood thinners. ? Taking medicines such as aspirin and ibuprofen. These medicines can thin your blood. Do not take these medicines unless your health care provider tells you to take them. ? Taking over-the-counter medicines, vitamins, herbs, and supplements.  Ask your health care provider how your surgical site will be marked or identified.  Ask your health care provider what steps will be taken to help prevent infection.  These may include: ? Removing hair at the surgery site. ? Washing skin with a germ-killing soap.  Plan to have someone take you home from the hospital or clinic.  Plan to have a responsible adult care for you for at least 24 hours after you leave the hospital or clinic. This is important.  Do  not use any products that contain nicotine or tobacco, such as cigarettes and e-cigarettes. If you need help quitting, ask your health care provider. What happens during the procedure?  An IV will be inserted into one of your veins.  You may be given one or more of the following: ? A medicine to help you relax (sedative). ? A medicine to numb the area (local anesthetic).  A small incision will be made on the left side of your upper chest.  A pocket will be created under your skin.  The device will be placed in the pocket.  The incision will be closed with stitches (sutures) or adhesive strips.  A bandage (dressing) will be placed over the incision. The procedure may vary among health care providers and hospitals. What happens after the procedure?  Your blood pressure, heart rate, breathing rate, and blood oxygen level will be monitored until you leave the hospital or clinic.  You may be able to go home on the day of your surgery. Before you go home: ? Your health care provider will program your recorder. ? You will learn how to trigger your device with a handheld activator. ? You will learn how to send recordings to your health care provider. ? You will get an ID card for your device, and you will be told when to use it.  Do not drive for 24 hours if you were given a sedative during your procedure. Summary  An implantable loop recorder is a small electronic device that is placed under the skin of your chest to monitor your heart over a long period of time.  The recorder can be left in place for 1 year or longer.  Plan to have someone take you home from the hospital or clinic. This information is not intended to replace advice given to you by your health care provider. Make sure you discuss any questions you have with your health care provider. Document Revised: 06/10/2017 Document Reviewed: 05/22/2017 Elsevier Patient Education  [employer] Placement, Care After This sheet gives you information about how to care for yourself after your procedure. Your health care provider may also give you more specific instructions. If you have problems or questions, contact your health care provider. What can I expect after the procedure? After the procedure, it is common to have:  Soreness or discomfort near the incision.  Some swelling or bruising near the incision. Follow these instructions at home: Incision care   Follow instructions from your health care provider about how to take care of your incision. Make sure you: ? Wash your hands with soap and water before you change your bandage (dressing). If soap and water are not available, use hand sanitizer. ? Change your dressing as told by your health care provider. ? Keep your dressing dry. ? Leave stitches (sutures), skin glue, or adhesive strips in place. These skin closures may need to stay in place for 2 weeks or longer. If adhesive strip edges start to loosen and curl up, you may trim the loose edges. Do not remove adhesive strips completely unless your health care provider  tells you to do that.  Check your incision area every day for signs of infection. Check for: ? Redness, swelling, or pain. ? Fluid or blood. ? Warmth. ? Pus or a bad smell.  Do not take baths, swim, or use a hot tub until your health care provider approves. Ask your health care provider if you can take showers. Activity   Return to your normal activities as told by your health care provider. Ask your health care provider what activities are safe for you.  Do not drive for 24 hours if you were given a sedative during your procedure. General instructions  Follow instructions from your health care provider about how to manage your implantable loop recorder and transmit the information. Learn how to activate a recording if this is necessary for your type of device.  Do not go through a metal  detection gate, and do not let someone hold a metal detector over your chest. Show your ID card.  Do not have an MRI unless you check with your health care provider first.  Take over-the-counter and prescription medicines only as told by your health care provider.  Keep all follow-up visits as told by your health care provider. This is important. Contact a health care provider if:  You have redness, swelling, or pain around your incision.  You have a fever.  You have pain that is not relieved by your pain medicine.  You have triggered your device because of fainting (syncope) or because of a heartbeat that feels like it is racing, slow, fluttering, or skipping (palpitations). Get help right away if you have:  Chest pain.  Difficulty breathing. Summary  After the procedure, it is common to have soreness or discomfort near the incision.  Change your dressing as told by your health care provider.  Follow instructions from your health care provider about how to manage your implantable loop recorder and transmit the information.  Keep all follow-up visits as told by your health care provider. This is important. This information is not intended to replace advice given to you by your health care provider. Make sure you discuss any questions you have with your health care provider. Document Revised: 05/22/2017 Document Reviewed: 05/22/2017 Elsevier Patient Education  2020 [employer].

## 2019-09-30 DIAGNOSIS — L089 Local infection of the skin and subcutaneous tissue, unspecified: Secondary | ICD-10-CM | POA: Diagnosis not present

## 2019-09-30 DIAGNOSIS — Z4802 Encounter for removal of sutures: Secondary | ICD-10-CM | POA: Diagnosis not present

## 2019-10-20 ENCOUNTER — Telehealth: Payer: Self-pay | Admitting: Cardiology

## 2019-10-20 DIAGNOSIS — R0609 Other forms of dyspnea: Secondary | ICD-10-CM

## 2019-10-20 DIAGNOSIS — M7989 Other specified soft tissue disorders: Secondary | ICD-10-CM

## 2019-10-20 NOTE — Telephone Encounter (Signed)
Increase for 2 days furosemide to 40 in the morning and 20 in the afternoon, Tuesday, let check Chem-7 and proBNP

## 2019-10-20 NOTE — Telephone Encounter (Signed)
Called and spoke to patient wife informed her per Dr. Agustin Cree to have patient increase lasix 40 mg in the morning and 20 mg in the pm for two days and to have the labs redrawn on Tuesday.

## 2019-10-20 NOTE — Telephone Encounter (Signed)
Pt c/o swelling: STAT is pt has developed SOB within 24 hours  1) How much weight have you gained and in what time span? 2-3? In about a weeks ( wife is guessing)   2) If swelling, where is the swelling located? Swelling in feet   3) Are you currently taking a fluid pill? yes  4) Are you currently SOB? No   5) Do you have a log of your daily weights (if so, list)? no  6) Have you gained 3 pounds in a day or 5 pounds in a week? No   7) Have you traveled recently? NO Wife wants to know if he can take more of his furosemide.  Right now he is on 20mg  2x's a day.

## 2019-10-24 NOTE — Addendum Note (Signed)
Addended by: Ashok Norris on: 10/24/2019 11:31 AM   Modules accepted: Orders

## 2019-10-25 LAB — BASIC METABOLIC PANEL
BUN/Creatinine Ratio: 12 (ref 10–24)
BUN: 18 mg/dL (ref 8–27)
CO2: 24 mmol/L (ref 20–29)
Calcium: 8.9 mg/dL (ref 8.6–10.2)
Chloride: 102 mmol/L (ref 96–106)
Creatinine, Ser: 1.48 mg/dL — ABNORMAL HIGH (ref 0.76–1.27)
GFR calc Af Amer: 54 mL/min/{1.73_m2} — ABNORMAL LOW (ref >59)
GFR calc non Af Amer: 47 mL/min/{1.73_m2} — ABNORMAL LOW (ref >59)
Glucose: 129 mg/dL — ABNORMAL HIGH (ref 65–99)
Potassium: 3.7 mmol/L (ref 3.5–5.2)
Sodium: 140 mmol/L (ref 134–144)

## 2019-10-25 LAB — PRO B NATRIURETIC PEPTIDE: NT-Pro BNP: 169 pg/mL (ref 0–376)

## 2019-10-31 ENCOUNTER — Ambulatory Visit (INDEPENDENT_AMBULATORY_CARE_PROVIDER_SITE_OTHER): Payer: Medicare Other | Admitting: *Deleted

## 2019-10-31 DIAGNOSIS — I4891 Unspecified atrial fibrillation: Secondary | ICD-10-CM | POA: Diagnosis not present

## 2019-11-01 LAB — CUP PACEART REMOTE DEVICE CHECK
Date Time Interrogation Session: 20210713105710
Implantable Pulse Generator Implant Date: 20210610

## 2019-11-01 NOTE — Progress Notes (Signed)
Carelink Summary Report / Loop Recorder 

## 2019-11-21 DIAGNOSIS — M19012 Primary osteoarthritis, left shoulder: Secondary | ICD-10-CM | POA: Diagnosis not present

## 2019-11-21 DIAGNOSIS — M25512 Pain in left shoulder: Secondary | ICD-10-CM | POA: Diagnosis not present

## 2019-11-24 ENCOUNTER — Telehealth: Payer: Self-pay | Admitting: Cardiology

## 2019-11-24 MED ORDER — METOPROLOL TARTRATE 25 MG PO TABS: 25.0000 mg | ORAL_TABLET | Freq: Two times a day (BID) | ORAL | 3 refills | Status: DC

## 2019-11-24 NOTE — Telephone Encounter (Signed)
Refill sent in per request.  

## 2019-11-24 NOTE — Telephone Encounter (Signed)
New message      *STAT* If patient is at the pharmacy, call can be transferred to refill team.   1. Which medications need to be refilled? (please list name of each medication and dose if known) metoprolol 25mg   2. Which pharmacy/location (including street and city if local pharmacy) is medication to be sent to? [facility] on [address] in [place]  3. Do they need a 30 day or 90 day supply? 90 day-----have appt this tues but will run out of rx today

## 2019-11-28 ENCOUNTER — Other Ambulatory Visit: Payer: Self-pay

## 2019-11-28 ENCOUNTER — Ambulatory Visit (INDEPENDENT_AMBULATORY_CARE_PROVIDER_SITE_OTHER): Payer: Medicare Other | Admitting: Cardiology

## 2019-11-28 ENCOUNTER — Encounter: Payer: Self-pay | Admitting: Cardiology

## 2019-11-28 VITALS — BP 122/82 | HR 60 | Ht 66.0 in | Wt 203.2 lb

## 2019-11-28 DIAGNOSIS — I25118 Atherosclerotic heart disease of native coronary artery with other forms of angina pectoris: Secondary | ICD-10-CM

## 2019-11-28 DIAGNOSIS — Z951 Presence of aortocoronary bypass graft: Secondary | ICD-10-CM

## 2019-11-28 DIAGNOSIS — I255 Ischemic cardiomyopathy: Secondary | ICD-10-CM

## 2019-11-28 DIAGNOSIS — Z8673 Personal history of transient ischemic attack (TIA), and cerebral infarction without residual deficits: Secondary | ICD-10-CM

## 2019-11-28 MED ORDER — POTASSIUM CHLORIDE ER 10 MEQ PO TBCR
10.0000 meq | EXTENDED_RELEASE_TABLET | Freq: Every day | ORAL | 1 refills | Status: DC
Start: 2019-11-28 — End: 2020-04-02

## 2019-11-28 MED ORDER — FUROSEMIDE 40 MG PO TABS: ORAL_TABLET | ORAL | 1 refills | Status: DC

## 2019-11-28 NOTE — Patient Instructions (Signed)
Medication Instructions:  Your physician has recommended you make the following change in your medication:   INCREASE: Lasix to 40 mg daily  START: Potassium 10 meq daily  *If you need a refill on your cardiac medications before your next appointment, please call your pharmacy*   Lab Work: Your physician recommends that you return for lab work in 1 week: bmp   If you have labs (blood work) drawn today and your tests are completely normal, you will receive your results only by: Marland Kitchen MyChart Message (if you have MyChart) OR . A paper copy in the mail If you have any lab test that is abnormal or we need to change your treatment, we will call you to review the results.   Testing/Procedures: None.     Follow-Up: At [facility], you and your health needs are our priority.  As part of our continuing mission to provide you with exceptional heart care, we have created designated Provider Care Teams.  These Care Teams include your primary Cardiologist (physician) and Advanced Practice Providers (APPs -  Physician Assistants and Nurse Practitioners) who all work together to provide you with the care you need, when you need it.  We recommend signing up for the patient portal called "MyChart".  Sign up information is provided on this After Visit Summary.  MyChart is used to connect with patients for Virtual Visits (Telemedicine).  Patients are able to view lab/test results, encounter notes, upcoming appointments, etc.  Non-urgent messages can be sent to your provider as well.   To learn more about what you can do with MyChart, go to [email or web address].    Your next appointment:   3 month(s)  The format for your next appointment:   In Person  Provider:   Jenne Campus, MD   Other Instructions

## 2019-11-28 NOTE — Progress Notes (Signed)
Cardiology Office Note:    Date:  11/28/2019   ID:  [facility], DOB 1945/04/23, MRN [id number]  PCP:  [name], MD  Cardiologist:  Jenne Campus, MD    Referring MD: [name], MD   No chief complaint on file. Feeling better  History of Present Illness:    [facility] Booker is a 74 y.o. male with complex past medical history which include coronary artery disease, status post coronary bypass graft few years ago after that he was find to have occluded EXCEPT LIMA.  Since that time he is being managed medically.  Recently he developed episode of TIA.  Likely recovered from this completely.  He is supposed to have implantable loop recorder and for some reason I do not see this happening.  We will try to investigate the issue.  He said today he is feeling better.  He has more energy he is able to do more.  Denies having any chest pain tightness squeezing pressure burning chest.  Past Medical History:  Diagnosis Date  . Angina pectoris ([place])   . Anginal pain ([place])   . Arthritis   . Atrial fibrillation ([place]) 09/15/2017  . Bilateral numbness and tingling of arms and legs 10/29/2015  . CAD (coronary artery disease) 09/28/2016  . Cardiogenic shock ([place])   . Cervical disc disease 10/29/2015  . Chest pain at rest 09/15/2017  . Chronic kidney disease (CKD), stage III (moderate) 09/15/2017  . Coronary artery disease   . Encounter for therapeutic drug monitoring 10/14/2016  . Hx of CABG   . Hyperlipemia 09/24/2016  . Hyperlipidemia   . Hypertension   . Ischemic cardiomyopathy 10/22/2016  . Myocardial infarction ([place])   . Neck pain 10/29/2015  . Non-ST elevation (NSTEMI) myocardial infarction ([place])   . NSTEMI (non-ST elevated myocardial infarction) ([place]) 09/24/2016  . Numbness in left leg 10/29/2015  . Paroxysmal tachycardia ([place])   . Pleural effusion   . Presence of aortocoronary bypass graft 10/08/2016   LIMA to LAD -SVG to DIAG SVG to RAMUS SVG to PDA  All grafts occluded except LIMA  based on cardiac catheterization 07/2016  . Respiratory failure ([place])   . S/P CABG x 4 10/08/2016   LIMA to LAD -SVG to DIAG SVG to RAMUS SVG to PDA  All grafts occluded except LIMA based on cardiac catheterization 07/2016  . Weakness of both arms 10/29/2015    Past Surgical History:  Procedure Laterality Date  . CORONARY ARTERY BYPASS GRAFT N/A 09/28/2016   Procedure: CORONARY ARTERY BYPASS GRAFTING (CABG) x four, using left internal mammary artery and right leg greater saphenous vein harvested endoscopically;  Surgeon: Ivin Poot, MD;  Location: [facility];  Service: Open Heart Surgery;  Laterality: N/A;  . IR THORACENTESIS ASP PLEURAL SPACE W/IMG GUIDE  10/06/2016  . LEFT HEART CATH AND CORONARY ANGIOGRAPHY N/A 09/24/2016   Procedure: Left Heart Cath and Coronary Angiography;  Surgeon: Lorretta Harp, MD;  Location: [facility];  Service: Cardiovascular;  Laterality: N/A;  . LEFT HEART CATH AND CORS/GRAFTS ANGIOGRAPHY N/A 02/11/2017   Procedure: LEFT HEART CATH AND CORS/GRAFTS ANGIOGRAPHY;  Surgeon: Jettie Booze, MD;  Location: [facility];  Service: Cardiovascular;  Laterality: N/A;  . TEE WITHOUT CARDIOVERSION N/A 09/28/2016   Procedure: TRANSESOPHAGEAL ECHOCARDIOGRAM (TEE);  Surgeon: Prescott Gum, Collier Salina, MD;  Location: [facility];  Service: Open Heart Surgery;  Laterality: N/A;  . ULTRASOUND GUIDANCE FOR VASCULAR ACCESS  02/11/2017   Procedure: Ultrasound Guidance For Vascular  Access;  Surgeon: Jettie Booze, MD;  Location: [facility];  Service: Cardiovascular;;    Current Medications: Current Meds  Medication Sig  . amLODipine (NORVASC) 5 MG tablet Take 1 tablet (5 mg total) by mouth daily.  Marland Kitchen aspirin 81 MG EC tablet Take 1 tablet (81 mg total) by mouth daily.  Marland Kitchen atorvastatin (LIPITOR) 80 MG tablet Take 1 tablet (80 mg total) by mouth daily.  . furosemide (LASIX) 40 MG tablet TAKE 1 TABLET DAILY  . isosorbide mononitrate (IMDUR) 60 MG 24 hr tablet Take 3  tablets (180 mg total) by mouth daily.  . memantine (NAMENDA) 5 MG tablet Take 1 tablet by mouth 2 (two) times daily.   . metoprolol tartrate (LOPRESSOR) 25 MG tablet Take 1 tablet (25 mg total) by mouth 2 (two) times daily.  . nitroGLYCERIN (NITRODUR - DOSED IN MG/24 HR) 0.1 mg/hr patch 0.1 mg daily as needed.  . pantoprazole (PROTONIX) 40 MG tablet Take 40 mg by mouth daily.   . ranolazine (RANEXA) 1000 MG SR tablet Take 1 tablet (1,000 mg total) by mouth 2 (two) times daily.  . rivaroxaban (XARELTO) 2.5 MG TABS tablet Take 1 tablet (2.5 mg total) by mouth 2 (two) times daily.  . [DISCONTINUED] furosemide (LASIX) 20 MG tablet TAKE 1 TABLET BY MOUTH AS NEEDED FOR LOWER EXTREMITY SWELLING.     Allergies:   Benadryl [diphenhydramine hcl (sleep)] and Levofloxacin   Social History   Socioeconomic History  . Marital status: Married    Spouse name: Not on file  . Number of children: Not on file  . Years of education: Not on file  . Highest education level: Not on file  Occupational History  . Occupation: Retired  Tobacco Use  . Smoking status: Never Smoker  . Smokeless tobacco: Never Used  Vaping Use  . Vaping Use: Never used  Substance and Sexual Activity  . Alcohol use: No  . Drug use: No  . Sexual activity: Yes    Birth control/protection: None  Other Topics Concern  . Not on file  Social History Narrative  . Not on file   Social Determinants of Health   Financial Resource Strain:   . Difficulty of Paying Living Expenses:   Food Insecurity:   . Worried About Charity fundraiser in the Last Year:   . Arboriculturist in the Last Year:   Transportation Needs:   . Film/video editor (Medical):   Marland Kitchen Lack of Transportation (Non-Medical):   Physical Activity:   . Days of Exercise per Week:   . Minutes of Exercise per Session:   Stress:   . Feeling of Stress :   Social Connections:   . Frequency of Communication with Friends and Family:   . Frequency of Social  Gatherings with Friends and Family:   . Attends Religious Services:   . Active Member of Clubs or Organizations:   . Attends Archivist Meetings:   Marland Kitchen Marital Status:      Family History: The patient's family history includes Heart attack in his father; Heart failure in his mother. ROS:   Please see the history of present illness.    All 14 point review of systems negative except as described per history of present illness  EKGs/Labs/Other Studies Reviewed:      Recent Labs: 04/11/2019: ALT 20; Hemoglobin 14.7; Platelets 178; TSH 2.330 10/24/2019: BUN 18; Creatinine, Ser 1.48; NT-Pro BNP 169; Potassium 3.7; Sodium 140  Recent Lipid  Panel    Component Value Date/Time   CHOL 96 (L) 09/14/2017 0840   TRIG 108 09/14/2017 0840   HDL 42 09/14/2017 0840   CHOLHDL 2.3 09/14/2017 0840   CHOLHDL 3.4 09/26/2016 0232   VLDL 21 09/26/2016 0232   LDLCALC 32 09/14/2017 0840    Physical Exam:    VS:  BP 122/82   Pulse 60   Ht 5\' 6"  (1.676 m)   Wt 203 lb 3.2 oz (92.2 kg)   SpO2 95%   BMI 32.80 kg/m     Wt Readings from Last 3 Encounters:  11/28/19 203 lb 3.2 oz (92.2 kg)  09/28/19 202 lb 6.4 oz (91.8 kg)  08/24/19 201 lb (91.2 kg)     GEN:  Well nourished, well developed in no acute distress HEENT: Normal NECK: No JVD; No carotid bruits LYMPHATICS: No lymphadenopathy CARDIAC: RRR, no murmurs, no rubs, no gallops RESPIRATORY:  Clear to auscultation without rales, wheezing or rhonchi  ABDOMEN: Soft, non-tender, non-distended MUSCULOSKELETAL:  No edema; No deformity  SKIN: Warm and dry LOWER EXTREMITIES: no swelling NEUROLOGIC:  Alert and oriented x 3 PSYCHIATRIC:  Normal affect   ASSESSMENT:    1. Ischemic cardiomyopathy   2. Coronary artery disease of native artery of native heart with stable angina pectoris ([place])   3. S/P CABG x 4   4. History of TIA (transient ischemic attack)    PLAN:    In order of problems listed above:  1. Ischemic cardiomyopathy.   Seems to be doing well but does have some swelling of lower extremities, I will ask him to increase dose of furosemide.  He takes 20 mg we will go from 40 next week he will have Chem-7 done.  I will add 10 mEq of potassium. 2. Coronary disease stable from that point review denies have any chest pain tightness squeezing pressure mid chest. 3. History of TIA: Luckily no new issues.  I will investigate the issue of implantable loop recorder.   Medication Adjustments/Labs and Tests Ordered: Current medicines are reviewed at length with the patient today.  Concerns regarding medicines are outlined above.  Orders Placed This Encounter  Procedures  . Basic metabolic panel   Medication changes:  Meds ordered this encounter  Medications  . furosemide (LASIX) 40 MG tablet    Sig: TAKE 1 TABLET DAILY    Dispense:  90 tablet    Refill:  1  . potassium chloride (KLOR-CON) 10 MEQ tablet    Sig: Take 1 tablet (10 mEq total) by mouth daily.    Dispense:  90 tablet    Refill:  1    Signed, Park Liter, MD, [facility] 11/28/2019 1:24 PM    [place]

## 2019-12-04 ENCOUNTER — Ambulatory Visit (INDEPENDENT_AMBULATORY_CARE_PROVIDER_SITE_OTHER): Payer: Medicare Other | Admitting: *Deleted

## 2019-12-04 ENCOUNTER — Other Ambulatory Visit: Payer: Self-pay | Admitting: Cardiology

## 2019-12-04 DIAGNOSIS — I4891 Unspecified atrial fibrillation: Secondary | ICD-10-CM

## 2019-12-04 LAB — CUP PACEART REMOTE DEVICE CHECK
Date Time Interrogation Session: 20210815105612
Implantable Pulse Generator Implant Date: 20210610

## 2019-12-06 NOTE — Progress Notes (Signed)
Carelink Summary Report / Loop Recorder 

## 2019-12-07 DIAGNOSIS — I255 Ischemic cardiomyopathy: Secondary | ICD-10-CM | POA: Diagnosis not present

## 2019-12-07 LAB — BASIC METABOLIC PANEL
BUN/Creatinine Ratio: 13 (ref 10–24)
BUN: 18 mg/dL (ref 8–27)
CO2: 22 mmol/L (ref 20–29)
Calcium: 8.8 mg/dL (ref 8.6–10.2)
Chloride: 104 mmol/L (ref 96–106)
Creatinine, Ser: 1.38 mg/dL — ABNORMAL HIGH (ref 0.76–1.27)
GFR calc Af Amer: 59 mL/min/{1.73_m2} — ABNORMAL LOW (ref >59)
GFR calc non Af Amer: 51 mL/min/{1.73_m2} — ABNORMAL LOW (ref >59)
Glucose: 113 mg/dL — ABNORMAL HIGH (ref 65–99)
Potassium: 4 mmol/L (ref 3.5–5.2)
Sodium: 138 mmol/L (ref 134–144)

## 2019-12-31 ENCOUNTER — Other Ambulatory Visit: Payer: Self-pay | Admitting: Cardiology

## 2020-01-08 ENCOUNTER — Ambulatory Visit (INDEPENDENT_AMBULATORY_CARE_PROVIDER_SITE_OTHER): Payer: Medicare Other | Admitting: *Deleted

## 2020-01-08 DIAGNOSIS — Z8673 Personal history of transient ischemic attack (TIA), and cerebral infarction without residual deficits: Secondary | ICD-10-CM | POA: Diagnosis not present

## 2020-01-09 LAB — CUP PACEART REMOTE DEVICE CHECK
Date Time Interrogation Session: 20210917105825
Implantable Pulse Generator Implant Date: 20210610

## 2020-01-10 NOTE — Progress Notes (Signed)
Carelink Summary Report / Loop Recorder 

## 2020-01-29 ENCOUNTER — Other Ambulatory Visit: Payer: Self-pay | Admitting: Cardiology

## 2020-01-29 MED ORDER — AMLODIPINE BESYLATE 5 MG PO TABS: 5.0000 mg | ORAL_TABLET | Freq: Every day | ORAL | 1 refills | Status: DC

## 2020-01-29 NOTE — Telephone Encounter (Signed)
Medication filled.  

## 2020-01-29 NOTE — Telephone Encounter (Signed)
*  STAT* If patient is at the pharmacy, call can be transferred to refill team.   1. Which medications need to be refilled? (please list name of each medication and dose if known) amLODipine (NORVASC) 5 MG tablet  2. Which pharmacy/location (including street and city if local pharmacy) is medication to be sent to? [id number] [place] [address]  3. Do they need a 30 day or 90 day supply? 90 day  Patient is out of meds

## 2020-02-05 ENCOUNTER — Other Ambulatory Visit: Payer: Self-pay | Admitting: Cardiology

## 2020-02-05 NOTE — Telephone Encounter (Signed)
Refill sent to pharmacy.   

## 2020-02-07 LAB — CUP PACEART REMOTE DEVICE CHECK
Date Time Interrogation Session: 20211020105931
Implantable Pulse Generator Implant Date: 20210610

## 2020-02-09 ENCOUNTER — Other Ambulatory Visit: Payer: Self-pay | Admitting: Cardiology

## 2020-02-12 ENCOUNTER — Ambulatory Visit (INDEPENDENT_AMBULATORY_CARE_PROVIDER_SITE_OTHER): Payer: Medicare Other

## 2020-02-12 DIAGNOSIS — I4891 Unspecified atrial fibrillation: Secondary | ICD-10-CM

## 2020-02-15 NOTE — Progress Notes (Signed)
Carelink Summary Report / Loop Recorder 

## 2020-02-16 ENCOUNTER — Telehealth: Payer: Self-pay | Admitting: Cardiology

## 2020-02-16 NOTE — Telephone Encounter (Signed)
PT has h/o Ischemic Cardiomyopathy and last seen 11/2019... will forward to Dr. Agustin Cree to be sure prior to calling the Pharmacy back.   Pt has been on Imdur since 03/2019 and started Nitrodur patch 12/04/19

## 2020-02-16 NOTE — Telephone Encounter (Signed)
Pt c/o medication issue:  1. Name of Medication:  nitroGLYCERIN (NITRODUR - DOSED IN MG/24 HR) 0.1 mg/hr patch isosorbide mononitrate (IMDUR) 60 MG 24 hr tablet  2. How are you currently taking this medication (dosage and times per day)? As directed  3. Are you having a reaction (difficulty breathing--STAT)?   4. What is your medication issue? Pharmacy needs confirmation that the patient is supposed to be taking both medications. Please advise

## 2020-02-19 NOTE — Telephone Encounter (Signed)
[facility] [place] advised. Pt to keep follow up 02/26/20.

## 2020-02-19 NOTE — Telephone Encounter (Signed)
Yes, she can take both, imdur plus paste

## 2020-02-20 DIAGNOSIS — Z23 Encounter for immunization: Secondary | ICD-10-CM | POA: Diagnosis not present

## 2020-02-23 ENCOUNTER — Other Ambulatory Visit: Payer: Self-pay

## 2020-02-23 DIAGNOSIS — E785 Hyperlipidemia, unspecified: Secondary | ICD-10-CM | POA: Insufficient documentation

## 2020-02-23 DIAGNOSIS — I219 Acute myocardial infarction, unspecified: Secondary | ICD-10-CM | POA: Insufficient documentation

## 2020-02-23 DIAGNOSIS — I209 Angina pectoris, unspecified: Secondary | ICD-10-CM | POA: Insufficient documentation

## 2020-02-23 DIAGNOSIS — I2510 Atherosclerotic heart disease of native coronary artery without angina pectoris: Secondary | ICD-10-CM | POA: Insufficient documentation

## 2020-02-23 DIAGNOSIS — M1990 Unspecified osteoarthritis, unspecified site: Secondary | ICD-10-CM | POA: Insufficient documentation

## 2020-02-25 ENCOUNTER — Emergency Department (HOSPITAL_COMMUNITY): Payer: Medicare Other

## 2020-02-25 ENCOUNTER — Encounter (HOSPITAL_COMMUNITY): Payer: Self-pay | Admitting: Emergency Medicine

## 2020-02-25 ENCOUNTER — Other Ambulatory Visit: Payer: Self-pay

## 2020-02-25 ENCOUNTER — Inpatient Hospital Stay (HOSPITAL_COMMUNITY)
Admission: EM | Admit: 2020-02-25 | Discharge: 2020-02-28 | DRG: 286 | Disposition: A | Discharge status: Home / Self Care | Payer: Medicare Other | Attending: Internal Medicine | Admitting: Internal Medicine

## 2020-02-25 DIAGNOSIS — M1990 Unspecified osteoarthritis, unspecified site: Secondary | ICD-10-CM | POA: Diagnosis present

## 2020-02-25 DIAGNOSIS — I482 Chronic atrial fibrillation, unspecified: Secondary | ICD-10-CM | POA: Diagnosis not present

## 2020-02-25 DIAGNOSIS — E669 Obesity, unspecified: Secondary | ICD-10-CM | POA: Diagnosis present

## 2020-02-25 DIAGNOSIS — Z79899 Other long term (current) drug therapy: Secondary | ICD-10-CM

## 2020-02-25 DIAGNOSIS — I479 Paroxysmal tachycardia, unspecified: Secondary | ICD-10-CM | POA: Diagnosis present

## 2020-02-25 DIAGNOSIS — I5032 Chronic diastolic (congestive) heart failure: Secondary | ICD-10-CM | POA: Diagnosis not present

## 2020-02-25 DIAGNOSIS — Z20822 Contact with and (suspected) exposure to covid-19: Secondary | ICD-10-CM | POA: Diagnosis present

## 2020-02-25 DIAGNOSIS — Z951 Presence of aortocoronary bypass graft: Secondary | ICD-10-CM

## 2020-02-25 DIAGNOSIS — I509 Heart failure, unspecified: Secondary | ICD-10-CM

## 2020-02-25 DIAGNOSIS — Z8673 Personal history of transient ischemic attack (TIA), and cerebral infarction without residual deficits: Secondary | ICD-10-CM

## 2020-02-25 DIAGNOSIS — R0602 Shortness of breath: Secondary | ICD-10-CM

## 2020-02-25 DIAGNOSIS — I739 Peripheral vascular disease, unspecified: Secondary | ICD-10-CM | POA: Diagnosis present

## 2020-02-25 DIAGNOSIS — I252 Old myocardial infarction: Secondary | ICD-10-CM | POA: Diagnosis not present

## 2020-02-25 DIAGNOSIS — Z7982 Long term (current) use of aspirin: Secondary | ICD-10-CM

## 2020-02-25 DIAGNOSIS — Z6832 Body mass index (BMI) 32.0-32.9, adult: Secondary | ICD-10-CM | POA: Diagnosis not present

## 2020-02-25 DIAGNOSIS — I444 Left anterior fascicular block: Secondary | ICD-10-CM | POA: Diagnosis present

## 2020-02-25 DIAGNOSIS — R609 Edema, unspecified: Secondary | ICD-10-CM

## 2020-02-25 DIAGNOSIS — N182 Chronic kidney disease, stage 2 (mild): Secondary | ICD-10-CM | POA: Diagnosis present

## 2020-02-25 DIAGNOSIS — I5033 Acute on chronic diastolic (congestive) heart failure: Secondary | ICD-10-CM | POA: Diagnosis not present

## 2020-02-25 DIAGNOSIS — R079 Chest pain, unspecified: Secondary | ICD-10-CM

## 2020-02-25 DIAGNOSIS — Z8249 Family history of ischemic heart disease and other diseases of the circulatory system: Secondary | ICD-10-CM

## 2020-02-25 DIAGNOSIS — I255 Ischemic cardiomyopathy: Secondary | ICD-10-CM | POA: Diagnosis present

## 2020-02-25 DIAGNOSIS — Z9114 Patient's other noncompliance with medication regimen: Secondary | ICD-10-CM

## 2020-02-25 DIAGNOSIS — N1831 Chronic kidney disease, stage 3a: Secondary | ICD-10-CM | POA: Diagnosis not present

## 2020-02-25 DIAGNOSIS — Z888 Allergy status to other drugs, medicaments and biological substances status: Secondary | ICD-10-CM | POA: Diagnosis not present

## 2020-02-25 DIAGNOSIS — I130 Hypertensive heart and chronic kidney disease with heart failure and stage 1 through stage 4 chronic kidney disease, or unspecified chronic kidney disease: Principal | ICD-10-CM | POA: Diagnosis present

## 2020-02-25 DIAGNOSIS — G4733 Obstructive sleep apnea (adult) (pediatric): Secondary | ICD-10-CM | POA: Diagnosis present

## 2020-02-25 DIAGNOSIS — E785 Hyperlipidemia, unspecified: Secondary | ICD-10-CM | POA: Diagnosis not present

## 2020-02-25 DIAGNOSIS — R600 Localized edema: Secondary | ICD-10-CM | POA: Diagnosis not present

## 2020-02-25 DIAGNOSIS — N183 Chronic kidney disease, stage 3 unspecified: Secondary | ICD-10-CM | POA: Diagnosis present

## 2020-02-25 DIAGNOSIS — R531 Weakness: Secondary | ICD-10-CM | POA: Diagnosis present

## 2020-02-25 DIAGNOSIS — I10 Essential (primary) hypertension: Secondary | ICD-10-CM | POA: Diagnosis present

## 2020-02-25 DIAGNOSIS — I2510 Atherosclerotic heart disease of native coronary artery without angina pectoris: Secondary | ICD-10-CM | POA: Diagnosis present

## 2020-02-25 DIAGNOSIS — I25118 Atherosclerotic heart disease of native coronary artery with other forms of angina pectoris: Secondary | ICD-10-CM | POA: Diagnosis not present

## 2020-02-25 DIAGNOSIS — I2584 Coronary atherosclerosis due to calcified coronary lesion: Secondary | ICD-10-CM | POA: Diagnosis present

## 2020-02-25 DIAGNOSIS — Z7901 Long term (current) use of anticoagulants: Secondary | ICD-10-CM

## 2020-02-25 DIAGNOSIS — R0789 Other chest pain: Secondary | ICD-10-CM | POA: Diagnosis not present

## 2020-02-25 DIAGNOSIS — I25119 Atherosclerotic heart disease of native coronary artery with unspecified angina pectoris: Secondary | ICD-10-CM | POA: Diagnosis not present

## 2020-02-25 HISTORY — DX: Chronic diastolic (congestive) heart failure: I50.32

## 2020-02-25 HISTORY — DX: Acute on chronic diastolic (congestive) heart failure: I50.33

## 2020-02-25 LAB — CBC
HCT: 40.9 % (ref 39.0–52.0)
Hemoglobin: 13.7 g/dL (ref 13.0–17.0)
MCH: 32.2 pg (ref 26.0–34.0)
MCHC: 33.5 g/dL (ref 30.0–36.0)
MCV: 96 fL (ref 80.0–100.0)
Platelets: 161 10*3/uL (ref 150–400)
RBC: 4.26 MIL/uL (ref 4.22–5.81)
RDW: 13.3 % (ref 11.5–15.5)
WBC: 7.7 10*3/uL (ref 4.0–10.5)
nRBC: 0 % (ref 0.0–0.2)

## 2020-02-25 LAB — HEPATIC FUNCTION PANEL
ALT: 24 U/L (ref 0–44)
AST: 19 U/L (ref 15–41)
Albumin: 3.3 g/dL — ABNORMAL LOW (ref 3.5–5.0)
Alkaline Phosphatase: 58 U/L (ref 38–126)
Bilirubin, Direct: 0.1 mg/dL (ref 0.0–0.2)
Indirect Bilirubin: 1 mg/dL — ABNORMAL HIGH (ref 0.3–0.9)
Total Bilirubin: 1.1 mg/dL (ref 0.3–1.2)
Total Protein: 5.9 g/dL — ABNORMAL LOW (ref 6.5–8.1)

## 2020-02-25 LAB — BASIC METABOLIC PANEL
Anion gap: 9 (ref 5–15)
BUN: 12 mg/dL (ref 8–23)
CO2: 23 mmol/L (ref 22–32)
Calcium: 8.9 mg/dL (ref 8.9–10.3)
Chloride: 105 mmol/L (ref 98–111)
Creatinine, Ser: 1.13 mg/dL (ref 0.61–1.24)
GFR, Estimated: >60 mL/min (ref >60)
Glucose, Bld: 98 mg/dL (ref 70–99)
Potassium: 4.3 mmol/L (ref 3.5–5.1)
Sodium: 137 mmol/L (ref 135–145)

## 2020-02-25 LAB — TROPONIN I (HIGH SENSITIVITY)
Troponin I (High Sensitivity): 6 ng/L (ref <18)
Troponin I (High Sensitivity): 5 ng/L (ref <18)

## 2020-02-25 LAB — RESPIRATORY PANEL BY RT PCR (FLU A&B, COVID)
Influenza A by PCR: NEGATIVE
Influenza B by PCR: NEGATIVE
SARS Coronavirus 2 by RT PCR: NEGATIVE

## 2020-02-25 LAB — BRAIN NATRIURETIC PEPTIDE: B Natriuretic Peptide: 102.1 pg/mL — ABNORMAL HIGH (ref 0.0–100.0)

## 2020-02-25 LAB — TSH: TSH: 1.771 u[IU]/mL (ref 0.350–4.500)

## 2020-02-25 MED ORDER — SODIUM CHLORIDE 0.9% FLUSH
3.0000 mL | INTRAVENOUS | Status: DC | PRN
  Administered 2020-02-27: 3 mL via INTRAVENOUS

## 2020-02-25 MED ORDER — ISOSORBIDE MONONITRATE ER 60 MG PO TB24
180.0000 mg | ORAL_TABLET | Freq: Every day | ORAL | Status: DC
  Administered 2020-02-26 – 2020-02-28 (×3): 180 mg via ORAL
  Filled 2020-02-25 (×3): qty 3

## 2020-02-25 MED ORDER — PANTOPRAZOLE SODIUM 40 MG PO TBEC
40.0000 mg | DELAYED_RELEASE_TABLET | Freq: Every day | ORAL | Status: DC
  Administered 2020-02-26 – 2020-02-28 (×3): 40 mg via ORAL
  Filled 2020-02-25 (×4): qty 1

## 2020-02-25 MED ORDER — METOPROLOL TARTRATE 25 MG PO TABS
25.0000 mg | ORAL_TABLET | Freq: Two times a day (BID) | ORAL | Status: DC
  Administered 2020-02-25 – 2020-02-26 (×3): 25 mg via ORAL
  Filled 2020-02-25 (×4): qty 1

## 2020-02-25 MED ORDER — ACETAMINOPHEN 325 MG PO TABS: 650.0000 mg | ORAL_TABLET | ORAL | Status: DC | PRN

## 2020-02-25 MED ORDER — SODIUM CHLORIDE 0.9 % IV SOLN: 250.0000 mL | INTRAVENOUS | Status: DC | PRN

## 2020-02-25 MED ORDER — FUROSEMIDE 10 MG/ML IJ SOLN: 40.0000 mg | Freq: Every day | INTRAMUSCULAR | Status: DC

## 2020-02-25 MED ORDER — ONDANSETRON HCL 4 MG/2ML IJ SOLN: 4.0000 mg | Freq: Four times a day (QID) | INTRAMUSCULAR | Status: DC | PRN

## 2020-02-25 MED ORDER — RIVAROXABAN 2.5 MG PO TABS
2.5000 mg | ORAL_TABLET | Freq: Two times a day (BID) | ORAL | Status: DC
  Administered 2020-02-25 – 2020-02-26 (×2): 2.5 mg via ORAL
  Filled 2020-02-25 (×3): qty 1

## 2020-02-25 MED ORDER — MEMANTINE HCL 10 MG PO TABS
5.0000 mg | ORAL_TABLET | Freq: Two times a day (BID) | ORAL | Status: DC
  Administered 2020-02-25 – 2020-02-28 (×6): 5 mg via ORAL
  Filled 2020-02-25 (×7): qty 1

## 2020-02-25 MED ORDER — RANOLAZINE ER 500 MG PO TB12
1000.0000 mg | ORAL_TABLET | Freq: Two times a day (BID) | ORAL | Status: DC
  Administered 2020-02-25 (×2): 500 mg via ORAL
  Administered 2020-02-26 – 2020-02-28 (×5): 1000 mg via ORAL
  Filled 2020-02-25: qty 1
  Filled 2020-02-25 (×7): qty 2

## 2020-02-25 MED ORDER — SODIUM CHLORIDE 0.9% FLUSH
3.0000 mL | Freq: Two times a day (BID) | INTRAVENOUS | Status: DC
  Administered 2020-02-26 – 2020-02-28 (×4): 3 mL via INTRAVENOUS

## 2020-02-25 MED ORDER — ATORVASTATIN CALCIUM 80 MG PO TABS
80.0000 mg | ORAL_TABLET | Freq: Every day | ORAL | Status: DC
  Administered 2020-02-25: 80 mg via ORAL
  Filled 2020-02-25: qty 1

## 2020-02-25 MED ORDER — ASPIRIN EC 81 MG PO TBEC
81.0000 mg | DELAYED_RELEASE_TABLET | Freq: Every day | ORAL | Status: DC
  Administered 2020-02-26: 81 mg via ORAL
  Filled 2020-02-25: qty 1

## 2020-02-25 MED ORDER — FUROSEMIDE 10 MG/ML IJ SOLN
40.0000 mg | Freq: Once | INTRAMUSCULAR | Status: AC
  Administered 2020-02-25: 40 mg via INTRAVENOUS
  Filled 2020-02-25: qty 4

## 2020-02-25 NOTE — ED Notes (Signed)
Patient transported to X-ray 

## 2020-02-25 NOTE — ED Notes (Addendum)
Report given to floor Ronalee Belts, RN. Pt was given a [place] sandwich, 248ml cup of applesauce.

## 2020-02-25 NOTE — ED Triage Notes (Signed)
C/o pressure to center of chest, SOB, and generalized weakness x 1 hour.

## 2020-02-25 NOTE — Progress Notes (Signed)
Pt has declined cpap at this time but is aware he can ask for it later if he chooses.  RT will continue to monitor.

## 2020-02-25 NOTE — ED Provider Notes (Signed)
[facility] Provider Note   CSN: [id number] Arrival date & time: 02/25/20  1131     History Chief Complaint  Patient presents with  . Chest Pain    [facility] Booker is a 74 y.o. male.  74 year old male with history of a-fib (on Xarelto), HTN, hyperlipidemia, CAD, NSTEMI, CABG x 4, presents with complaint of fatigue x 1 week, stopped Lasix, now with chest pressure onset this morning while sitting at church, described as intense pressure. Similar event last week that was brief and resolved. Shortness of breath, worse with exertion, denies diaphoresis, denies orthopnea. Wife reports increase in weight. At this time reports mild shortness of breath, no chest pain. No other complaints or concerns. Patient is prescribed lasix for swelling but does not feel they help, no history of CHF.     HPI: A 74 year old patient with a history of peripheral artery disease, hypertension, hypercholesterolemia and obesity presents for evaluation of chest pain. Initial onset of pain was approximately 3-6 hours ago. The patient's chest pain is described as heaviness/pressure/tightness and is not worse with exertion. The patient's chest pain is middle- or left-sided, is not well-localized, is not sharp and does not radiate to the arms/jaw/neck. The patient does not complain of nausea and denies diaphoresis. The patient has no history of stroke, has not smoked in the past 90 days, denies any history of treated diabetes and has no relevant family history of coronary artery disease (first degree relative at less than age 37).   Past Medical History:  Diagnosis Date  . Angina pectoris ([place])   . Anginal pain ([place])   . Arthritis   . Atrial fibrillation ([place]) 09/15/2017  . Bilateral numbness and tingling of arms and legs 10/29/2015  . CAD (coronary artery disease) 09/28/2016  . Cardiogenic shock ([place])   . Cervical disc disease 10/29/2015  . Chest pain at rest 09/15/2017  . Chronic  kidney disease (CKD), stage III (moderate) ([place]) 09/15/2017  . Coronary artery disease   . Encounter for therapeutic drug monitoring 10/14/2016  . History of TIA (transient ischemic attack) 07/27/2019  . Hx of CABG   . Hyperlipemia 09/24/2016  . Hyperlipidemia   . Hypertension   . Ischemic cardiomyopathy 10/22/2016  . Myocardial infarction ([place])   . Neck pain 10/29/2015  . Non-ST elevation (NSTEMI) myocardial infarction ([place])   . NSTEMI (non-ST elevated myocardial infarction) ([place]) 09/24/2016  . Numbness in left leg 10/29/2015  . Paroxysmal tachycardia ([place])   . Pleural effusion   . Presence of aortocoronary bypass graft 10/08/2016   LIMA to LAD -SVG to DIAG SVG to RAMUS SVG to PDA  All grafts occluded except LIMA based on cardiac catheterization 07/2016  . Respiratory failure ([place])   . S/P CABG x 4 10/08/2016   LIMA to LAD -SVG to DIAG SVG to RAMUS SVG to PDA  All grafts occluded except LIMA based on cardiac catheterization 07/2016  . Weakness of both arms 10/29/2015    Patient Active Problem List   Diagnosis Date Noted  . Anginal pain ([place])   . Arthritis   . Coronary artery disease   . Hyperlipidemia   . Myocardial infarction ([place])   . History of TIA (transient ischemic attack) 07/27/2019  . Chronic kidney disease (CKD), stage III (moderate) ([place]) 09/15/2017  . Chest pain at rest 09/15/2017  . Atrial fibrillation ([place]) 09/15/2017  . Angina pectoris ([place])   . Ischemic cardiomyopathy 10/22/2016  . Encounter for therapeutic drug monitoring  10/14/2016  . S/P CABG x 4 10/08/2016  . Presence of aortocoronary bypass graft 10/08/2016  . Paroxysmal tachycardia ([place])   . Pleural effusion   . Respiratory failure ([place])   . Cardiogenic shock ([place])   . CAD (coronary artery disease) 09/28/2016  . Hypertension 09/24/2016  . Hyperlipemia 09/24/2016  . NSTEMI (non-ST elevated myocardial infarction) ([place]) 09/24/2016  . Non-ST elevation (NSTEMI) myocardial infarction ([place])   . Bilateral numbness and  tingling of arms and legs 10/29/2015  . Cervical disc disease 10/29/2015  . Neck pain 10/29/2015  . Numbness in left leg 10/29/2015  . Weakness of both arms 10/29/2015    Past Surgical History:  Procedure Laterality Date  . CORONARY ARTERY BYPASS GRAFT N/A 09/28/2016   Procedure: CORONARY ARTERY BYPASS GRAFTING (CABG) x four, using left internal mammary artery and right leg greater saphenous vein harvested endoscopically;  Surgeon: Ivin Poot, MD;  Location: [facility];  Service: Open Heart Surgery;  Laterality: N/A;  . IR THORACENTESIS ASP PLEURAL SPACE W/IMG GUIDE  10/06/2016  . LEFT HEART CATH AND CORONARY ANGIOGRAPHY N/A 09/24/2016   Procedure: Left Heart Cath and Coronary Angiography;  Surgeon: Lorretta Harp, MD;  Location: [facility];  Service: Cardiovascular;  Laterality: N/A;  . LEFT HEART CATH AND CORS/GRAFTS ANGIOGRAPHY N/A 02/11/2017   Procedure: LEFT HEART CATH AND CORS/GRAFTS ANGIOGRAPHY;  Surgeon: Jettie Booze, MD;  Location: [facility];  Service: Cardiovascular;  Laterality: N/A;  . TEE WITHOUT CARDIOVERSION N/A 09/28/2016   Procedure: TRANSESOPHAGEAL ECHOCARDIOGRAM (TEE);  Surgeon: Prescott Gum, Collier Salina, MD;  Location: [facility];  Service: Open Heart Surgery;  Laterality: N/A;  . ULTRASOUND GUIDANCE FOR VASCULAR ACCESS  02/11/2017   Procedure: Ultrasound Guidance For Vascular Access;  Surgeon: Jettie Booze, MD;  Location: [facility];  Service: Cardiovascular;;       Family History  Problem Relation Age of Onset  . Heart failure Mother   . Heart attack Father     Social History   Tobacco Use  . Smoking status: Never Smoker  . Smokeless tobacco: Never Used  Vaping Use  . Vaping Use: Never used  Substance Use Topics  . Alcohol use: No  . Drug use: No    Home Medications Prior to Admission medications   Medication Sig Start Date End Date Taking? Authorizing Provider  amLODipine (NORVASC) 5 MG tablet Take 1 tablet (5 mg total) by  mouth daily. 01/29/20   Park Liter, MD  aspirin 81 MG EC tablet Take 1 tablet (81 mg total) by mouth daily. 11/30/16   Park Liter, MD  atorvastatin (LIPITOR) 80 MG tablet Take 1 tablet (80 mg total) by mouth daily. 04/11/19   Park Liter, MD  furosemide (LASIX) 20 MG tablet TAKE 1 TABLET BY MOUTH AS NEEDED FOR LOWER EXTREMITY SWELLING. 02/05/20   Park Liter, MD  furosemide (LASIX) 40 MG tablet TAKE 1 TABLET DAILY 11/28/19   Park Liter, MD  isosorbide mononitrate (IMDUR) 60 MG 24 hr tablet Take 3 tablets (180 mg total) by mouth daily. 04/11/19   Park Liter, MD  memantine (NAMENDA) 5 MG tablet Take 1 tablet by mouth 2 (two) times daily.  01/11/19   [provider]  metoprolol tartrate (LOPRESSOR) 25 MG tablet Take 1 tablet (25 mg total) by mouth 2 (two) times daily. 11/24/19   Park Liter, MD  nitroGLYCERIN (NITRODUR - DOSED IN MG/24 HR) 0.1 mg/hr patch PLACE 1  PATCH ON THE SKIN DAILY 12/04/19   Park Liter, MD  nitroGLYCERIN (NITROSTAT) 0.4 MG SL tablet Place 0.4 mg under the tongue. 01/31/20   [provider]  pantoprazole (PROTONIX) 40 MG tablet Take 40 mg by mouth daily.  07/05/16   [provider]  potassium chloride (KLOR-CON) 10 MEQ tablet Take 1 tablet (10 mEq total) by mouth daily. 11/28/19 02/26/20  Park Liter, MD  ranolazine (RANEXA) 1000 MG SR tablet TAKE 1  BY MOUTH TWICE DAILY 01/01/20   Park Liter, MD  XARELTO 2.5 MG TABS tablet TAKE 1 TABLET BY MOUTH TWICE A DAY 02/09/20   Park Liter, MD    Allergies    Benadryl [diphenhydramine hcl (sleep)] and Levofloxacin  Review of Systems   Review of Systems  Constitutional: Positive for fatigue. Negative for chills and fever.  Respiratory: Positive for shortness of breath. Negative for cough.   Cardiovascular: Positive for chest pain and leg swelling.  Gastrointestinal: Positive for abdominal distention. Negative for abdominal  pain, constipation, diarrhea, nausea and vomiting.  Genitourinary: Negative for difficulty urinating and dysuria.  Musculoskeletal: Negative for arthralgias and myalgias.  Skin: Negative for rash and wound.  Allergic/Immunologic: Negative for immunocompromised state.  Neurological: Positive for weakness. Negative for numbness.  Psychiatric/Behavioral: Negative for confusion.  All other systems reviewed and are negative.   Physical Exam Updated Vital Signs BP 112/70   Pulse (!) 55   Temp 98 F (36.7 C) (Oral)   Resp 13   Ht 5\' 6"  (1.676 m)   Wt 92 kg   SpO2 98%   BMI 32.74 kg/m   Physical Exam Vitals and nursing note reviewed.  Constitutional:      General: He is not in acute distress.    Appearance: He is well-developed. He is not diaphoretic.  HENT:     Head: Normocephalic and atraumatic.  Cardiovascular:     Rate and Rhythm: Normal rate and regular rhythm.     Heart sounds: Normal heart sounds. No murmur heard.   Pulmonary:     Effort: Pulmonary effort is normal.     Breath sounds: Normal breath sounds. No decreased breath sounds.  Chest:     Chest wall: No tenderness.  Musculoskeletal:     Right lower leg: Edema present.     Left lower leg: Edema present.  Skin:    General: Skin is warm and dry.     Findings: No erythema or rash.  Neurological:     Mental Status: He is alert and oriented to person, place, and time.  Psychiatric:        Behavior: Behavior normal.     ED Results / Procedures / Treatments   Labs (all labs ordered are listed, but only abnormal results are displayed) Labs Reviewed  BRAIN NATRIURETIC PEPTIDE - Abnormal; Notable for the following components:      Result Value   B Natriuretic Peptide 102.1 (*)    All other components within normal limits  HEPATIC FUNCTION PANEL - Abnormal; Notable for the following components:   Total Protein 5.9 (*)    Albumin 3.3 (*)    Indirect Bilirubin 1.0 (*)    All other components within normal  limits  RESPIRATORY PANEL BY RT PCR (FLU A&B, COVID)  BASIC METABOLIC PANEL  CBC  TSH  TROPONIN I (HIGH SENSITIVITY)  TROPONIN I (HIGH SENSITIVITY)    EKG EKG Interpretation  Date/Time:  Sunday February 25 2020 11:36:30 EST Ventricular Rate:  66  PR Interval:  146 QRS Duration: 90 QT Interval:  432 QTC Calculation: 452 R Axis:   -66 Text Interpretation: Normal sinus rhythm Low voltage QRS Left anterior fascicular block Cannot rule out Anterior infarct , age undetermined Artifact Baseline wander Confirmed by Blanchie Dessert [phone]) on 02/25/2020 12:10:32 PM   Radiology DG Chest 2 View  Result Date: 02/25/2020 CLINICAL DATA:  Chest pain/pressure, shortness of breath, and generalized weakness for 1 hour, elevated blood pressure EXAM: CHEST - 2 VIEW COMPARISON:  06/04/2019 FINDINGS: Loop recorder projects over LEFT chest. Normal heart size post CABG. Mediastinal contours and pulmonary vascularity normal. Lungs clear. No infiltrate, pleural effusion, or pneumothorax. Osseous structures unremarkable. IMPRESSION: No acute abnormalities. Electronically Signed   By: Lavonia Dana M.D.   On: 02/25/2020 12:28    Procedures Procedures (including critical care time)  Medications Ordered in ED Medications - No data to display  ED Course  I have reviewed the triage vital signs and the nursing notes.  Pertinent labs & imaging results that were available during my care of the patient were reviewed by me and considered in my medical decision making (see chart for details).  Clinical Course as of Feb 24 1501  Sun Feb 24, 4057  [id number] 74 year old with multiple co-morbidities presents with complaint of intense chest pressure that occurred today while at church and last several minutes before gradually improving. Patient states he had some chest discomfort last week but was brief and he didn't think much of it. States he has been short of breath, worse with exertion, and very fatigued lately. Patient  attributed his fatigue to his lasix which he takes for lower extremity swelling and stopped taking them last week. Patient wife notes he has gained weight recently and pants he put on last night did not fit this morning. On exam, patient appears generally unwell and weak but not toxic. Pitting edema to lower extremities. Labs without significant findings include CBC, CMP. Covid negative and patient is vaccinated- states he is still grieving the loss of his 72 year old granddaughter who did from [place] 2 weeks ago. BNP 102, no history of CHF, echo 2/21 with EF of 55-60% CXR unremarkable. Loop recorder interrogated and does not show any recent arrhythmia.    [LM]  1500 Case discussed with Dr. Maryan Rued, ER attending, due to cardiac risk factors, plan is for consult to hospital for his chest pain for further monitoring.  EKG without acute ischemic changes. Repeat troponin pending, initial troponin 6.   [LM]    Clinical Course User Index [LM] Roque Lias   MDM Rules/Calculators/A&P HEAR Score: 6                        Final Clinical Impression(s) / ED Diagnoses Final diagnoses:  Chest pain, unspecified type  Shortness of breath  Edema, unspecified type    Rx / DC Orders ED Discharge Orders    None       Tacy Learn, PA-C 02/25/20 1545    Blanchie Dessert, MD 02/28/20 1644

## 2020-02-25 NOTE — H&P (Signed)
History and Physical    Ernest Booker INO:[id number] DOB: 1946/02/16 DOA: 02/25/2020  PCP: Ernest Kiel, MD (Confirm with patient/family/NH records and if not entered, this has to be entered at [facility] point of entry) Patient coming from: Home  I have personally briefly reviewed patient's old medical records in [place]  Chief Complaint: Weakness  HPI: Ernest Booker is a 74 y.o. male with medical history significant of ischemic cardiomyopathy, A. fib on Xarelto, CAD with CABG with recent study shows wall bypass occluded (3 out of 4) except LIMA, stable angina, hypertension, CKD stage II, came with fatigue and generalized weakness x1 week.  Patient has been following with cardiologist for similar issues since spring Oct last year, after patient had a TIA.  Patient was started on Lasix 20 mg daily then decrease to 40 mg daily August of this year.  Patient started to feel his weakness issue became worse last week, and he decided to stop taking Lasix self 7 days ago.  Initially feeling much better with weakness, however starting from yesterday he started to feel very tired all over.  And wife notes noticed patient has been more swelling on his shin area bilaterally.  This morning, while trying to put up his panel found going back up about.  Patient also feels heavy feeling in the chest, no cough no fever chills no recent symptoms no diarrhea.  He has been forcing 6 to 8 cups of water of fluid restriction every day. ED Course: X-ray clear, 2 sets troponin negative, EKG no ischemic changes.  Review of Systems: As per HPI otherwise 14 point review of systems negative.    Past Medical History:  Diagnosis Date  . Angina pectoris ([place])   . Anginal pain ([place])   . Arthritis   . Atrial fibrillation ([place]) 09/15/2017  . Bilateral numbness and tingling of arms and legs 10/29/2015  . CAD (coronary artery disease) 09/28/2016  . Cardiogenic shock ([place])   . Cervical disc disease 10/29/2015  . Chest pain at  rest 09/15/2017  . Chronic kidney disease (CKD), stage III (moderate) ([place]) 09/15/2017  . Coronary artery disease   . Encounter for therapeutic drug monitoring 10/14/2016  . History of TIA (transient ischemic attack) 07/27/2019  . Hx of CABG   . Hyperlipemia 09/24/2016  . Hyperlipidemia   . Hypertension   . Ischemic cardiomyopathy 10/22/2016  . Myocardial infarction ([place])   . Neck pain 10/29/2015  . Non-ST elevation (NSTEMI) myocardial infarction ([place])   . NSTEMI (non-ST elevated myocardial infarction) ([place]) 09/24/2016  . Numbness in left leg 10/29/2015  . Paroxysmal tachycardia ([place])   . Pleural effusion   . Presence of aortocoronary bypass graft 10/08/2016   LIMA to LAD -SVG to DIAG SVG to RAMUS SVG to PDA  All grafts occluded except LIMA based on cardiac catheterization 07/2016  . Respiratory failure ([place])   . S/P CABG x 4 10/08/2016   LIMA to LAD -SVG to DIAG SVG to RAMUS SVG to PDA  All grafts occluded except LIMA based on cardiac catheterization 07/2016  . Weakness of both arms 10/29/2015    Past Surgical History:  Procedure Laterality Date  . CORONARY ARTERY BYPASS GRAFT N/A 09/28/2016   Procedure: CORONARY ARTERY BYPASS GRAFTING (CABG) x four, using left internal mammary artery and right leg greater saphenous vein harvested endoscopically;  Surgeon: Ivin Poot, MD;  Location: [place];  Service: Open Heart Surgery;  Laterality: N/A;  . IR THORACENTESIS ASP PLEURAL SPACE W/IMG GUIDE  10/06/2016  . LEFT HEART CATH AND CORONARY ANGIOGRAPHY N/A 09/24/2016   Procedure: Left Heart Cath and Coronary Angiography;  Surgeon: Lorretta Harp, MD;  Location: [facility];  Service: Cardiovascular;  Laterality: N/A;  . LEFT HEART CATH AND CORS/GRAFTS ANGIOGRAPHY N/A 02/11/2017   Procedure: LEFT HEART CATH AND CORS/GRAFTS ANGIOGRAPHY;  Surgeon: Jettie Booze, MD;  Location: [place] [facility];  Service: Cardiovascular;  Laterality: N/A;  . TEE WITHOUT CARDIOVERSION N/A 09/28/2016   Procedure:  TRANSESOPHAGEAL ECHOCARDIOGRAM (TEE);  Surgeon: Prescott Gum, Collier Salina, MD;  Location: [place];  Service: Open Heart Surgery;  Laterality: N/A;  . ULTRASOUND GUIDANCE FOR VASCULAR ACCESS  02/11/2017   Procedure: Ultrasound Guidance For Vascular Access;  Surgeon: Jettie Booze, MD;  Location: [facility];  Service: Cardiovascular;;     reports that he has never smoked. He has never used smokeless tobacco. He reports that he does not drink alcohol and does not use drugs.  Allergies  Allergen Reactions  . Benadryl [Diphenhydramine Hcl (Sleep)]     UNSPECIFIED REACTION   . Levofloxacin Other (See Comments)    Tendon rupture     Family History  Problem Relation Age of Onset  . Heart failure Mother   . Heart attack Father     Prior to Admission medications   Medication Sig Start Date End Date Taking? Authorizing Provider  amLODipine (NORVASC) 5 MG tablet Take 1 tablet (5 mg total) by mouth daily. 01/29/20   Park Liter, MD  aspirin 81 MG EC tablet Take 1 tablet (81 mg total) by mouth daily. 11/30/16   Park Liter, MD  atorvastatin (LIPITOR) 80 MG tablet Take 1 tablet (80 mg total) by mouth daily. 04/11/19   Park Liter, MD  furosemide (LASIX) 20 MG tablet TAKE 1 TABLET BY MOUTH AS NEEDED FOR LOWER EXTREMITY SWELLING. 02/05/20   Park Liter, MD  furosemide (LASIX) 40 MG tablet TAKE 1 TABLET DAILY 11/28/19   Park Liter, MD  isosorbide mononitrate (IMDUR) 60 MG 24 hr tablet Take 3 tablets (180 mg total) by mouth daily. 04/11/19   Park Liter, MD  memantine (NAMENDA) 5 MG tablet Take 1 tablet by mouth 2 (two) times daily.  01/11/19   [provider]  metoprolol tartrate (LOPRESSOR) 25 MG tablet Take 1 tablet (25 mg total) by mouth 2 (two) times daily. 11/24/19   Park Liter, MD  nitroGLYCERIN (NITRODUR - DOSED IN MG/24 HR) 0.1 mg/hr patch PLACE 1 PATCH ON THE SKIN DAILY 12/04/19   Park Liter, MD  nitroGLYCERIN  (NITROSTAT) 0.4 MG SL tablet Place 0.4 mg under the tongue. 01/31/20   [provider]  pantoprazole (PROTONIX) 40 MG tablet Take 40 mg by mouth daily.  07/05/16   [provider]  potassium chloride (KLOR-CON) 10 MEQ tablet Take 1 tablet (10 mEq total) by mouth daily. 11/28/19 02/26/20  Park Liter, MD  ranolazine (RANEXA) 1000 MG SR tablet TAKE 1  BY MOUTH TWICE DAILY 01/01/20   Park Liter, MD  XARELTO 2.5 MG TABS tablet TAKE 1 TABLET BY MOUTH TWICE A DAY 02/09/20   Park Liter, MD    Physical Exam: Vitals:   02/25/20 1623 02/25/20 1745 02/25/20 1800 02/25/20 1845  BP: 115/68  124/69 132/61  Pulse: (!) 55 (!) 59 (!) 58 (!) 58  Resp: 16 11 (!) 8 (!) 9  Temp:      TempSrc:  SpO2: 96% 98% 97% 97%  Weight:      Height:        Constitutional: NAD, calm, comfortable Vitals:   02/25/20 1623 02/25/20 1745 02/25/20 1800 02/25/20 1845  BP: 115/68  124/69 132/61  Pulse: (!) 55 (!) 59 (!) 58 (!) 58  Resp: 16 11 (!) 8 (!) 9  Temp:      TempSrc:      SpO2: 96% 98% 97% 97%  Weight:      Height:       Eyes: PERRL, lids and conjunctivae normal ENMT: Mucous membranes are moist. Posterior pharynx clear of any exudate or lesions.Normal dentition.  Neck: normal, supple, no masses, no thyromegaly Respiratory: clear to auscultation bilaterally, no wheezing, no crackles. Normal respiratory effort. No accessory muscle use.  Cardiovascular: Regular rate and rhythm, no murmurs / rubs / gallops. 2+ extremity edema more on the left side. 2+ pedal pulses. No carotid bruits.  Abdomen: no tenderness, no masses palpated. No hepatosplenomegaly. Bowel sounds positive.  Musculoskeletal: no clubbing / cyanosis. No joint deformity upper and lower extremities. Good ROM, no contractures. Normal muscle tone.  Skin: no rashes, lesions, ulcers. No induration Neurologic: CN 2-12 grossly intact. Sensation intact, DTR normal. Strength 5/5 in all 4.  Psychiatric: Normal  judgment and insight. Alert and oriented x 3. Normal mood.     Labs on Admission: I have personally reviewed following labs and imaging studies  CBC: Recent Labs  Lab 02/25/20 1228  WBC 7.7  HGB 13.7  HCT 40.9  MCV 96.0  PLT 657   Basic Metabolic Panel: Recent Labs  Lab 02/25/20 1228  NA 137  K 4.3  CL 105  CO2 23  GLUCOSE 98  BUN 12  CREATININE 1.13  CALCIUM 8.9   GFR: Estimated Creatinine Clearance: 62.8 mL/min (by C-G formula based on SCr of 1.13 mg/dL). Liver Function Tests: Recent Labs  Lab 02/25/20 1228  AST 19  ALT 24  ALKPHOS 58  BILITOT 1.1  PROT 5.9*  ALBUMIN 3.3*   No results for input(s): LIPASE, AMYLASE in the last 168 hours. No results for input(s): AMMONIA in the last 168 hours. Coagulation Profile: No results for input(s): INR, PROTIME in the last 168 hours. Cardiac Enzymes: No results for input(s): CKTOTAL, CKMB, CKMBINDEX, TROPONINI in the last 168 hours. BNP (last 3 results) Recent Labs    10/24/19 1135  PROBNP 169   HbA1C: No results for input(s): HGBA1C in the last 72 hours. CBG: No results for input(s): GLUCAP in the last 168 hours. Lipid Profile: No results for input(s): CHOL, HDL, LDLCALC, TRIG, CHOLHDL, LDLDIRECT in the last 72 hours. Thyroid Function Tests: Recent Labs    02/25/20 1522  TSH 1.771   Anemia Panel: No results for input(s): VITAMINB12, FOLATE, FERRITIN, TIBC, IRON, RETICCTPCT in the last 72 hours. Urine analysis:    Component Value Date/Time   COLORURINE YELLOW 09/25/2016 1820   APPEARANCEUR CLEAR 09/25/2016 1820   LABSPEC 1.014 09/25/2016 1820   PHURINE 5.0 09/25/2016 1820   GLUCOSEU NEGATIVE 09/25/2016 1820   HGBUR NEGATIVE 09/25/2016 1820   BILIRUBINUR NEGATIVE 09/25/2016 1820   KETONESUR NEGATIVE 09/25/2016 1820   PROTEINUR NEGATIVE 09/25/2016 1820   NITRITE NEGATIVE 09/25/2016 1820   LEUKOCYTESUR NEGATIVE 09/25/2016 1820    Radiological Exams on Admission: DG Chest 2 View  Result Date:  02/25/2020 CLINICAL DATA:  Chest pain/pressure, shortness of breath, and generalized weakness for 1 hour, elevated blood pressure EXAM: CHEST - 2 VIEW COMPARISON:  06/04/2019  FINDINGS: Loop recorder projects over LEFT chest. Normal heart size post CABG. Mediastinal contours and pulmonary vascularity normal. Lungs clear. No infiltrate, pleural effusion, or pneumothorax. Osseous structures unremarkable. IMPRESSION: No acute abnormalities. Electronically Signed   By: Lavonia Dana M.D.   On: 02/25/2020 12:28    EKG: Independently reviewed.  Low voltage, sinus rhythm.  Assessment/Plan Active Problems:   Acute on chronic diastolic CHF (congestive heart failure) (HCC)   CHF (congestive heart failure) ([place])  (please populate well all problems here in Problem List. (For example, if patient is on BP meds at home and you resume or decide to hold them, it is a problem that needs to be her. Same for CAD, COPD, HLD and so on)  Generalized weakness -Symptoms at least traced back to October last year, for which she has not been following with cardiologist's been trying to optimize his cardiomyopathy and cardio medications.  Looks like there is noncompliant issue with Lasix, however patient weight was showed that his with no significant increased compared to his record this year we are his baseline weight was somewhere between 91 kg to 91.5 kg and today's weight is 92 kg.  But wife did report that patient had trouble to buckle his belt this morning and they told him his legs become more swollen.  However his chest x-ray does not appear to be fluid overload. -Other etiology, patient has mild sleep apnea which was shown on the sleep study March this year, but both patient and family confirmed that patient has been compliant with her CPAP. -TSH was checked. -Stress test last year was normal. -Cardio to check Loop record data -PT evaluation  Chronic diastolic CHF decompensation -From noncompliant with Lasix, as  above.  Stable angina -Continue ranolazine and as needed nitroglycerin  CAD with CABG -Normal stress test last year.  Hypertension -Blood pressure in the 110s in the ER, will hold his amlodipine for tonight while restarting Lasix.  Cardiologist aware.  Chronic A. Fib -According to cardiologist note patient is on cardiovascular event reduction dose of Xarelto of 2.5 mg p.o. twice daily, but there was a plan to increase to the full dose of 20 mg daily.  Patient had a TIA this summer, when he had a facial droop and left-sided weakness.  But he reported that his current symptoms not like a TIA episode and neuro exam nonfocal today, suspect low suspicion for CVA.  Peripheral edema -Asymmetrical, more on the left, given that he is not on a full protection dose, will check DVT study.  OSA -CPAP at bedtime   DVT prophylaxis: Xarelto Code Status: Full code Family Communication: Wife at bedside Disposition Plan: Patient with complicated medical problem cardiomyopathy came with CHF decompensation of fluid overload, likely will need more than 2 midnight hospital stay for aggressive diuresis. Consults called: Cardiology Admission status: Telemetry admission   Lequita Halt MD [facility] Pager [phone]  02/25/2020, 6:50 PM

## 2020-02-25 NOTE — ED Notes (Addendum)
Pt denies chest pain at this time. Pt given a cup of sprite - 23ml.

## 2020-02-25 NOTE — ED Provider Notes (Signed)
  Physical Exam  BP 115/68   Pulse (!) 55   Temp 97.8 F (36.6 C) (Oral)   Resp 16   Ht 5\' 6"  (1.676 m)   Wt 92 kg   SpO2 96%   BMI 32.74 kg/m   Physical Exam  ED Course/Procedures    Procedures  MDM  Care of patient assumed from off going provider at 1500.  Patient with extensive cardiac history, had episode of intense chest pressure with some exertional shortness of breath over the last few days.  Chest pain has resolved but continues to feel generalized weak with some shortness of breath.  Needs admission for further evaluation of chest pain.  Pending call from admitting team.  Patient admitted in stable condition.  No further acute events while under my care.  The care of this patient was overseen by Dr. Alvino Chapel, ED attending, who agreed with evaluation and management of the patient.      Launa Flight, MD 02/25/20 Pollie Meyer    Davonna Belling, MD 02/25/20 [id number]

## 2020-02-26 ENCOUNTER — Ambulatory Visit: Payer: Medicare Other | Admitting: Cardiology

## 2020-02-26 ENCOUNTER — Inpatient Hospital Stay (HOSPITAL_COMMUNITY): Payer: Medicare Other

## 2020-02-26 DIAGNOSIS — N1831 Chronic kidney disease, stage 3a: Secondary | ICD-10-CM

## 2020-02-26 DIAGNOSIS — I10 Essential (primary) hypertension: Secondary | ICD-10-CM

## 2020-02-26 DIAGNOSIS — I5032 Chronic diastolic (congestive) heart failure: Secondary | ICD-10-CM

## 2020-02-26 DIAGNOSIS — R079 Chest pain, unspecified: Secondary | ICD-10-CM

## 2020-02-26 DIAGNOSIS — I482 Chronic atrial fibrillation, unspecified: Secondary | ICD-10-CM

## 2020-02-26 DIAGNOSIS — R609 Edema, unspecified: Secondary | ICD-10-CM

## 2020-02-26 DIAGNOSIS — I25118 Atherosclerotic heart disease of native coronary artery with other forms of angina pectoris: Secondary | ICD-10-CM

## 2020-02-26 LAB — BASIC METABOLIC PANEL
Anion gap: 10 (ref 5–15)
BUN: 12 mg/dL (ref 8–23)
CO2: 24 mmol/L (ref 22–32)
Calcium: 9.3 mg/dL (ref 8.9–10.3)
Chloride: 103 mmol/L (ref 98–111)
Creatinine, Ser: 1.41 mg/dL — ABNORMAL HIGH (ref 0.61–1.24)
GFR, Estimated: 53 mL/min — ABNORMAL LOW (ref >60)
Glucose, Bld: 136 mg/dL — ABNORMAL HIGH (ref 70–99)
Potassium: 3.6 mmol/L (ref 3.5–5.1)
Sodium: 137 mmol/L (ref 135–145)

## 2020-02-26 LAB — BLOOD GAS, VENOUS
Acid-Base Excess: 3.1 mmol/L — ABNORMAL HIGH (ref 0.0–2.0)
Bicarbonate: 27 mmol/L (ref 20.0–28.0)
FIO2: 21
O2 Saturation: 74.8 %
Patient temperature: 37
pCO2, Ven: 40.7 mmHg — ABNORMAL LOW (ref 44.0–60.0)
pH, Ven: 7.438 — ABNORMAL HIGH (ref 7.250–7.430)
pO2, Ven: 40.3 mmHg (ref 32.0–45.0)

## 2020-02-26 LAB — ECHOCARDIOGRAM COMPLETE
Height: 66 in
Weight: 3230.4 oz

## 2020-02-26 LAB — MAGNESIUM: Magnesium: 2.1 mg/dL (ref 1.7–2.4)

## 2020-02-26 MED ORDER — SODIUM CHLORIDE 0.9 % IV SOLN: 250.0000 mL | INTRAVENOUS | Status: DC | PRN

## 2020-02-26 MED ORDER — SODIUM CHLORIDE 0.9 % WEIGHT BASED INFUSION
1.0000 mL/kg/h | INTRAVENOUS | Status: DC
  Administered 2020-02-27: 1 mL/kg/h via INTRAVENOUS

## 2020-02-26 MED ORDER — SODIUM CHLORIDE 0.9 % WEIGHT BASED INFUSION
3.0000 mL/kg/h | INTRAVENOUS | Status: DC
  Administered 2020-02-27: 3 mL/kg/h via INTRAVENOUS

## 2020-02-26 MED ORDER — SODIUM CHLORIDE 0.9% FLUSH: 3.0000 mL | INTRAVENOUS | Status: DC | PRN

## 2020-02-26 MED ORDER — SODIUM CHLORIDE 0.9% FLUSH
3.0000 mL | Freq: Two times a day (BID) | INTRAVENOUS | Status: DC
  Administered 2020-02-26 – 2020-02-27 (×3): 3 mL via INTRAVENOUS

## 2020-02-26 MED ORDER — ASPIRIN EC 81 MG PO TBEC
81.0000 mg | DELAYED_RELEASE_TABLET | Freq: Every day | ORAL | Status: DC
  Administered 2020-02-28: 81 mg via ORAL
  Filled 2020-02-26 (×2): qty 1

## 2020-02-26 MED ORDER — ASPIRIN 81 MG PO CHEW
81.0000 mg | CHEWABLE_TABLET | ORAL | Status: AC
  Administered 2020-02-27: 81 mg via ORAL
  Filled 2020-02-26: qty 1

## 2020-02-26 MED ORDER — ATORVASTATIN CALCIUM 80 MG PO TABS
80.0000 mg | ORAL_TABLET | Freq: Every day | ORAL | Status: DC
  Administered 2020-02-26: 80 mg via ORAL
  Filled 2020-02-26: qty 1

## 2020-02-26 NOTE — Consult Note (Addendum)
Cardiology Consultation:   Patient ID: Ernest Booker MRN: [id number]; DOB: [birth year]  Admit date: 02/25/2020 Date of Consult: 02/26/2020  Primary Care Provider: [name], MD [facility] Cardiologist: Jenne Campus, MD  [place] Electrophysiologist:  None    Patient Profile:   Ernest Booker is a 74 y.o. male with a hx of CAD s/p CABG '18 (all grafts occluded except LIMA-LAD on cath 01/2017), HTN, HLD, and TIA who is being seen today for the evaluation of chest pain at the request of Dr. Aileen Fass.  History of Present Illness:   [name] is a 74 year old male with past medical history noted above.  He is followed by Dr. Agustin Cree as an outpatient.  Underwent CABG in June 2018, presented back to [facility] in October 2018 with unstable angina and underwent cardiac catheterization noting all grafts occluded with the exception of his LIMA to the LAD.  Plan was to try and optimize medical therapy.  He was started on Imdur 30 mg daily.  It was noted consideration for PCI/CTO of the LAD and possible rPDA if he failed medical therapy.  He was continued on aspirin, statin, Imdur, beta-blocker and ACE inhibitor.  Echocardiogram on 11/2018 showed an EF of 60 to 65% with impaired relaxation, no evidence of regional wall motion abnormalities. He was hospitalized in 05/2019 with profound weakness and dx with a TIA. He wore a Zio patch for one week that showed a narrow complex tachycardia but no afib. He was seen back in the office on 08/2019 and referred for consideration of loop recorder. Seen by Dr. Curt Bears in the outpatient setting and decision was made to implant loop recorder.  Was last seen in the office on 8/21 with Dr. Agustin Cree.  Reported at this office visit he was having intermittent episodes of chest tightness.  Also had some lower extremity edema and he was instructed to increase his Lasix from 20 mg to 40 mg.   He presented to the ED on 11/7 with c/o profound weakness  while in church. Also had some chest discomfort. Wife brought him to the ED. Here his labs showed stable electrolytes, Cr 1.13, BNP 102, hsTn 6>>5, WBC 7.7. CXR without acute findings. EKG showed SR but with significant artifact. He was admitted to IM for further management.   In talking with the patient and his wife, he reports has been having intermittent episodes of weakness for months now. Has had extensive neurological work up as well. Loop recorder thus far without significant arrhythmia noted. In regards to his chest pain, he states he generally has about one episode at least per week during which he takes SL nitro with improvement in symptoms. Chest discomfort is generally with activity, but sometimes at rest as well. Usually resolves with 1 SL nitro, but at times uses 2 before improvement. His biggest complaint is the weakness. States he has become very limited in his physical activity and when episodes happen he is generally "wiped out" for quite some time. States he had stopped his lasix for about a week prior to admission thinking this may have been contributing. Did develop worsening LE edema, and reported having trouble buttoning his pants 2/2 abd swelling.   Was given a dose of IV lasix 40mg  on admission, net - 1.6L, with improvement in his LE edema per his report.    Past Medical History:  Diagnosis Date  . Angina pectoris ([place])   . Anginal pain ([place])   . Arthritis   . Atrial fibrillation (  [place]) 09/15/2017  . Bilateral numbness and tingling of arms and legs 10/29/2015  . CAD (coronary artery disease) 09/28/2016  . Cardiogenic shock ([place])   . Cervical disc disease 10/29/2015  . Chest pain at rest 09/15/2017  . Chronic kidney disease (CKD), stage III (moderate) ([place]) 09/15/2017  . Coronary artery disease   . Encounter for therapeutic drug monitoring 10/14/2016  . History of TIA (transient ischemic attack) 07/27/2019  . Hx of CABG   . Hyperlipemia 09/24/2016  . Hyperlipidemia   .  Hypertension   . Ischemic cardiomyopathy 10/22/2016  . Myocardial infarction ([place])   . Neck pain 10/29/2015  . Non-ST elevation (NSTEMI) myocardial infarction ([place])   . NSTEMI (non-ST elevated myocardial infarction) ([place]) 09/24/2016  . Numbness in left leg 10/29/2015  . Paroxysmal tachycardia ([place])   . Pleural effusion   . Presence of aortocoronary bypass graft 10/08/2016   LIMA to LAD -SVG to DIAG SVG to RAMUS SVG to PDA  All grafts occluded except LIMA based on cardiac catheterization 07/2016  . Respiratory failure ([place])   . S/P CABG x 4 10/08/2016   LIMA to LAD -SVG to DIAG SVG to RAMUS SVG to PDA  All grafts occluded except LIMA based on cardiac catheterization 07/2016  . Weakness of both arms 10/29/2015    Past Surgical History:  Procedure Laterality Date  . CORONARY ARTERY BYPASS GRAFT N/A 09/28/2016   Procedure: CORONARY ARTERY BYPASS GRAFTING (CABG) x four, using left internal mammary artery and right leg greater saphenous vein harvested endoscopically;  Surgeon: Ivin Poot, MD;  Location: [place];  Service: Open Heart Surgery;  Laterality: N/A;  . IR THORACENTESIS ASP PLEURAL SPACE W/IMG GUIDE  10/06/2016  . LEFT HEART CATH AND CORONARY ANGIOGRAPHY N/A 09/24/2016   Procedure: Left Heart Cath and Coronary Angiography;  Surgeon: Lorretta Harp, MD;  Location: [place] [facility];  Service: Cardiovascular;  Laterality: N/A;  . LEFT HEART CATH AND CORS/GRAFTS ANGIOGRAPHY N/A 02/11/2017   Procedure: LEFT HEART CATH AND CORS/GRAFTS ANGIOGRAPHY;  Surgeon: Jettie Booze, MD;  Location: [place] [facility];  Service: Cardiovascular;  Laterality: N/A;  . TEE WITHOUT CARDIOVERSION N/A 09/28/2016   Procedure: TRANSESOPHAGEAL ECHOCARDIOGRAM (TEE);  Surgeon: Prescott Gum, Collier Salina, MD;  Location: [place];  Service: Open Heart Surgery;  Laterality: N/A;  . ULTRASOUND GUIDANCE FOR VASCULAR ACCESS  02/11/2017   Procedure: Ultrasound Guidance For Vascular Access;  Surgeon: Jettie Booze, MD;   Location: [place] [facility];  Service: Cardiovascular;;     Home Medications:  Prior to Admission medications   Medication Sig Start Date End Date Taking? Authorizing Provider  amLODipine (NORVASC) 5 MG tablet Take 1 tablet (5 mg total) by mouth daily. 01/29/20  Yes Park Liter, MD  aspirin 81 MG EC tablet Take 1 tablet (81 mg total) by mouth daily. 11/30/16  Yes Park Liter, MD  atorvastatin (LIPITOR) 80 MG tablet Take 1 tablet (80 mg total) by mouth daily. Patient taking differently: Take 80 mg by mouth at bedtime.  04/11/19  Yes Park Liter, MD  furosemide (LASIX) 40 MG tablet TAKE 1 TABLET DAILY Patient taking differently: Take 40 mg by mouth in the morning.  11/28/19  Yes Park Liter, MD  isosorbide mononitrate (IMDUR) 60 MG 24 hr tablet Take 3 tablets (180 mg total) by mouth daily. 04/11/19  Yes Park Liter, MD  memantine (NAMENDA) 5 MG tablet Take 5 mg by mouth 2 (two) times daily.  01/11/19  Yes [provider]  metoprolol tartrate (LOPRESSOR) 25 MG tablet Take 1 tablet (25 mg total) by mouth 2 (two) times daily. 11/24/19  Yes Park Liter, MD  nitroGLYCERIN (NITRODUR - DOSED IN MG/24 HR) 0.1 mg/hr patch PLACE 1 PATCH ON THE SKIN DAILY Patient taking differently: Place 0.1 mg onto the skin daily.  12/04/19  Yes Park Liter, MD  nitroGLYCERIN (NITROSTAT) 0.4 MG SL tablet Place 0.4 mg under the tongue every 5 (five) minutes as needed for chest pain.  01/31/20  Yes [provider]  pantoprazole (PROTONIX) 40 MG tablet Take 40 mg by mouth in the morning and at bedtime.  07/05/16  Yes [provider]  potassium chloride (KLOR-CON) 10 MEQ tablet Take 1 tablet (10 mEq total) by mouth daily. 11/28/19 02/26/20 Yes Park Liter, MD  ranolazine (RANEXA) 1000 MG SR tablet TAKE 1  BY MOUTH TWICE DAILY Patient taking differently: Take 1,000 mg by mouth 2 (two) times daily.  01/01/20  Yes Park Liter, MD  XARELTO  2.5 MG TABS tablet TAKE 1 TABLET BY MOUTH TWICE A DAY Patient taking differently: Take 2.5 mg by mouth 2 (two) times daily.  02/09/20  Yes Park Liter, MD  furosemide (LASIX) 20 MG tablet TAKE 1 TABLET BY MOUTH AS NEEDED FOR LOWER EXTREMITY SWELLING. Patient not taking: Reported on 02/25/2020 02/05/20   Park Liter, MD    Inpatient Medications: Scheduled Meds: . aspirin EC  81 mg Oral Daily  . atorvastatin  80 mg Oral q1800  . isosorbide mononitrate  180 mg Oral Daily  . memantine  5 mg Oral BID  . metoprolol tartrate  25 mg Oral BID  . pantoprazole  40 mg Oral Daily  . ranolazine  1,000 mg Oral BID  . rivaroxaban  2.5 mg Oral BID  . sodium chloride flush  3 mL Intravenous Q12H   Continuous Infusions: . sodium chloride     PRN Meds: sodium chloride, acetaminophen, ondansetron (ZOFRAN) IV, sodium chloride flush  Allergies:    Allergies  Allergen Reactions  . Benadryl [Diphenhydramine Hcl (Sleep)] Other (See Comments)    Reaction not recalled  . Levofloxacin Other (See Comments)    Tendon rupture     Social History:   Social History   Socioeconomic History  . Marital status: Married    Spouse name: Not on file  . Number of children: Not on file  . Years of education: Not on file  . Highest education level: Not on file  Occupational History  . Occupation: Retired  Tobacco Use  . Smoking status: Never Smoker  . Smokeless tobacco: Never Used  Vaping Use  . Vaping Use: Never used  Substance and Sexual Activity  . Alcohol use: No  . Drug use: No  . Sexual activity: Yes    Birth control/protection: None  Other Topics Concern  . Not on file  Social History Narrative  . Not on file   Social Determinants of Health   Financial Resource Strain:   . Difficulty of Paying Living Expenses: Not on file  Food Insecurity:   . Worried About Charity fundraiser in the Last Year: Not on file  . Ran Out of Food in the Last Year: Not on file  Transportation  Needs:   . Lack of Transportation (Medical): Not on file  . Lack of Transportation (Non-Medical): Not on file  Physical Activity:   . Days of Exercise per Week: Not on file  .  Minutes of Exercise per Session: Not on file  Stress:   . Feeling of Stress : Not on file  Social Connections:   . Frequency of Communication with Friends and Family: Not on file  . Frequency of Social Gatherings with Friends and Family: Not on file  . Attends Religious Services: Not on file  . Active Member of Clubs or Organizations: Not on file  . Attends Archivist Meetings: Not on file  . Marital Status: Not on file  Intimate Partner Violence:   . Fear of Current or Ex-Partner: Not on file  . Emotionally Abused: Not on file  . Physically Abused: Not on file  . Sexually Abused: Not on file    Family History:    Family History  Problem Relation Age of Onset  . Heart failure Mother   . Heart attack Father      ROS:  Please see the history of present illness.   All other ROS reviewed and negative.     Physical Exam/Data:   Vitals:   02/25/20 2320 02/26/20 0004 02/26/20 0852 02/26/20 0855  BP: 139/81  106/66 106/66  Pulse: (!) 57  (!) 58 (!) 57  Resp: 18   16  Temp: 98.1 F (36.7 C)   98.2 F (36.8 C)  TempSrc: Oral   Oral  SpO2: 97%   96%  Weight:  91.6 kg    Height:  5\' 6"  (1.676 m)      Intake/Output Summary (Last 24 hours) at 02/26/2020 1113 Last data filed at 02/26/2020 0404 Gross per 24 hour  Intake 357 ml  Output 1975 ml  Net -1618 ml   Last 3 Weights 02/26/2020 02/25/2020 11/28/2019  Weight (lbs) 201 lb 14.4 oz 202 lb 13.2 oz 203 lb 3.2 oz  Weight (kg) 91.581 kg 92 kg 92.171 kg     Body mass index is 32.59 kg/m.  General:  Well nourished, well developed, in no acute distress HEENT: normal Lymph: no adenopathy Neck: no JVD Endocrine:  No thryomegaly Vascular: No carotid bruits Cardiac:  normal S1, S2; RRR; no murmur  Lungs:  clear to auscultation bilaterally, no  wheezing, rhonchi or rales  Abd: soft, nontender, no hepatomegaly  Ext: no edema Musculoskeletal:  No deformities, BUE and BLE strength normal and equal Skin: warm and dry  Neuro:  CNs 2-12 intact, no focal abnormalities noted Psych:  Normal affect   EKG:  The EKG was personally reviewed and demonstrates:  SR with artifact  Relevant CV Studies:  Cath: 01/2017   RPDA lesion, 90 %stenosed.  Ost Ramus lesion, 95 %stenosed.  1st Diag lesion, 95 %stenosed.  Ost LAD lesion, 70 %stenosed.  Ost RPDA to RPDA lesion, 95 %stenosed.  SVG.  Origin lesion, 100 %stenosed.  Ramus lesion, 80 %stenosed.  Lat Ramus lesion, 100 %stenosed.  Origin lesion, 100 %stenosed.  2nd Mrg lesion, 100 %stenosed.  Dist LAD lesion, 100 %stenosed.  The left ventricular systolic function is normal.  LV end diastolic pressure is normal.  The left ventricular ejection fraction is 55-65% by visual estimate.  There is no aortic valve stenosis.   Severe three vessel CAD involving ostial and distal LAD, ostial and mid ramus, occluded OM, and severe diffuse PDA disease.  RCA is large dominant vessel and PLA has only moderate disease, supplying lateral wall.   3/4 grafts occluded.  Normal LV function.    Consider CTO PCI of the LAD if he fails medical therapy.    Diagnostic Dominance: Right  Echo: 11/2018  IMPRESSIONS    1. The left ventricle has normal systolic function with an ejection  fraction of 60-65%. The cavity size was normal. Left ventricular diastolic  Doppler parameters are consistent with impaired relaxation. Elevated left  ventricular end-diastolic pressure  No evidence of left ventricular regional wall motion abnormalities.  2. The right ventricle has normal systolic function. The cavity was  normal. There is no increase in right ventricular wall thickness.  3. The aortic valve is tricuspid. Mild thickening of the aortic valve.  Mild focal calcification of the  aortic valve. Aortic valve regurgitation  was not assessed by color flow Doppler.  4. There is mild dilatation of the ascending aorta measuring 39 mm.  5. No evidence of mitral valve stenosis.  6. The aorta is normal in size and structure.  7. The aortic root and ascending aorta are normal in size and structure.   Laboratory Data:  High Sensitivity Troponin:   Recent Labs  Lab 02/25/20 1228 02/25/20 1438  TROPONINIHS 6 5     Chemistry Recent Labs  Lab 02/25/20 1228 02/26/20 0046  NA 137 137  K 4.3 3.6  CL 105 103  CO2 23 24  GLUCOSE 98 136*  BUN 12 12  CREATININE 1.13 1.41*  CALCIUM 8.9 9.3  GFRNONAA >60 53*  ANIONGAP 9 10    Recent Labs  Lab 02/25/20 1228  PROT 5.9*  ALBUMIN 3.3*  AST 19  ALT 24  ALKPHOS 58  BILITOT 1.1   Hematology Recent Labs  Lab 02/25/20 1228  WBC 7.7  RBC 4.26  HGB 13.7  HCT 40.9  MCV 96.0  MCH 32.2  MCHC 33.5  RDW 13.3  PLT 161   BNP Recent Labs  Lab 02/25/20 1228  BNP 102.1*    DDimer No results for input(s): DDIMER in the last 168 hours.   Radiology/Studies:  DG Chest 2 View  Result Date: 02/25/2020 CLINICAL DATA:  Chest pain/pressure, shortness of breath, and generalized weakness for 1 hour, elevated blood pressure EXAM: CHEST - 2 VIEW COMPARISON:  06/04/2019 FINDINGS: Loop recorder projects over LEFT chest. Normal heart size post CABG. Mediastinal contours and pulmonary vascularity normal. Lungs clear. No infiltrate, pleural effusion, or pneumothorax. Osseous structures unremarkable. IMPRESSION: No acute abnormalities. Electronically Signed   By: Lavonia Dana M.D.   On: 02/25/2020 12:28     Assessment and Plan:   Amrit Cress is a 74 y.o. male with a hx of CAD s/p CABG '18 (all grafts occluded except LIMA-LAD on cath 01/2017), HTN, HLD and TIA who is being seen today for the evaluation of chest pain at the request of Dr. Aileen Fass.  1. Weakness/chest discomfort: seem he has been struggling with ongoing  weakness for quite some time. Dx with TIA back in 05/2019, since has had a loop recorder placed, but no Afib noted. Does have intermittent episodes of chest discomfort but these seem to be quickly responsive to SL nitro. On admission his hsTn were essentially negative. EKG without notable ischemia. Does have known coronary disease with suggestion to consider CTO if failed medical therapy. Given his ongoing symptoms, this weakness could be his anginal equivalent. Would be reasonable to restudy with cardiac cath to determine if new lesion present. Was given Xarelto today, therefore will plan for cath tomorrow.  -- The patient understands that risks included but are not limited to stroke (1 in 1000), death (1 in 85), kidney failure [usually temporary] (1 in 500), bleeding (1 in 200),  allergic reaction [possibly serious] (1 in 200).  --hold Xarelto ( low dose 2.5mg  BID vascular dose) -- NPO at midnight  2. CAD s/p CABG: underwent CABG 2018, follow up cath 01/2017 with 3/4 down grafts. Has been on good medical therapy with ASA, statin, BB, Imdur, Ranexa, and amlodipine. If no acute lesion noted, may need to back down on medical therapy to determine if this helps with symptoms.   3. Chronic diastolic HF: echo 04/6382 with normal EF. Had stopped his lasix about one week prior to admission. Given IV lasix 40mg  x1 on admission, with improvement. UOP 1.6L noted.  4. HTN: stable blood pressures on current therapy  5. HLD: on statin therapy  6. OSA: on Cpap  For questions or updates, please contact [place] Please consult [email or web address] for contact info under    Signed, Reino Bellis, NP  02/26/2020 11:13 AM    Patient seen and examined. Agree with assessment and plan.  Mr. 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  He has established CAD and is followed by Dr. Agustin Cree.  In June 2018 he was found to have significant multivessel CAD and underwent CABG  revascularization surgery x4 with a LIMA to LAD, SVG to diagonal, SVG to ramus, and SVG to PDA.  He developed unstable angina in October 2018 and underwent repeat catheterization where he was found to have occlusion of all vein grafts and although his LIMA was patent the LAD beyond the LIMA insertion was occluded.  Since that time he has been on medical management and medications have been progressively increased to now include isosorbide 180 mg daily, metoprolol tartrate 25 mg twice a day, ranolazine 1000 mg twice a day, and he also is on low-dose rivaroxaban at 2.5 mg twice daily.  He has noticed progressive weakness.  There is a prior history of TIA.  He has a loop recorder in place but atrial fibrillation has never been detected.  Over the past 6 months he has been extremely limited with his ability to do any activity due to completely wearing out.  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  He has experienced episodes of chest tightness intermittently but his predominant complaint is that of just being completely debilitated and inability to be active due to shortness of breath and weakness.  Presently he is not having any chest pain.  He had held his furosemide dose for several days prior to his episode on Sunday.  On exam he is in no acute distress.  He is hard of hearing.  Blood pressure today was 106/66 with him in sinus rhythm with a pulse of 58.  There is no JVD.  His lungs are clear.  There is no chest wall tenderness to palpation.  Abdomen was nontender.  Bowel sounds are positive.  There was no edema.  Distal pulses were intact.  Neurologic evaluation was grossly normal.  He had normal affect.  His ECG shows normal sinus rhythm at 66 bpm with baseline artifact.  There is evidence for left anterior hemiblock and poor R wave progression.  QTc interval is 452 ms.  I had a long discussion with both  Mr. Durden and his wife.  The patient has essentially become markedly debilitated predominantly due to increasing weakness.  It is certainly possible this is nonischemic particularly since he has had subsequent  stress test that were interpreted as low risk and had normal LV function.  However, it has been 3 years since his last catheterization and he certainly possible that he may have developed progressive more proximal disease contributing to some of his symptomatology.  Alternatively some of the medications may be a contributor in his weakness.  After much discussion concerning risks and benefits I have recommended definitive repeat cardiac catheterization to delineate his coronary anatomy.  Once his anatomy is known, further discussion regarding optimal treatment such as potential redo CABG revascularization, percutaneous revascularization or medication lowering can be considered.  The patient has been on low-dose of Xarelto at just 2.5 mg twice a day.  This will be held in anticipation of his catheterization tomorrow afternoon.  We will recheck lipid studies and if LDL is above 70 add Zetia to his current atorvastatin 80 mg.   Troy Sine, MD, [facility] 02/26/2020 2:33 PM

## 2020-02-26 NOTE — Progress Notes (Signed)
Informed consent for cardiac catheterization with possible percutaneous intervention obtained/signed by patient and placed in physical chart.

## 2020-02-26 NOTE — H&P (View-Only) (Signed)
Cardiology Consultation:   Patient ID: Ernest Booker MRN: [id number]; DOB: 08/19/1945  Admit date: 02/25/2020 Date of Consult: 02/26/2020  Primary Care Provider: [name], MD [facility] Cardiologist: Jenne Campus, MD  [place] Electrophysiologist:  None    Patient Profile:   Ernest Booker is a 74 y.o. male with a hx of CAD s/p CABG '18 (all grafts occluded except LIMA-LAD on cath 01/2017), HTN, HLD, and TIA who is being seen today for the evaluation of chest pain at the request of Dr. Aileen Fass.  History of Present Illness:   Ernest Booker is a 74 year old male with past medical history noted above.  He is followed by Dr. Agustin Cree as an outpatient.  Underwent CABG in June 2018, presented back to [facility] in October 2018 with unstable angina and underwent cardiac catheterization noting all grafts occluded with the exception of his LIMA to the LAD.  Plan was to try and optimize medical therapy.  He was started on Imdur 30 mg daily.  It was noted consideration for PCI/CTO of the LAD and possible rPDA if he failed medical therapy.  He was continued on aspirin, statin, Imdur, beta-blocker and ACE inhibitor.  Echocardiogram on 11/2018 showed an EF of 60 to 65% with impaired relaxation, no evidence of regional wall motion abnormalities. He was hospitalized in 05/2019 with profound weakness and dx with a TIA. He wore a Zio patch for one week that showed a narrow complex tachycardia but no afib. He was seen back in the office on 08/2019 and referred for consideration of loop recorder. Seen by Dr. Curt Bears in the outpatient setting and decision was made to implant loop recorder.  Was last seen in the office on 8/21 with Dr. Agustin Cree.  Reported at this office visit he was having intermittent episodes of chest tightness.  Also had some lower extremity edema and he was instructed to increase his Lasix from 20 mg to 40 mg.   He presented to the ED on 11/7 with c/o profound weakness  while in church. Also had some chest discomfort. Wife brought him to the ED. Here his labs showed stable electrolytes, Cr 1.13, BNP 102, hsTn 6>>5, WBC 7.7. CXR without acute findings. EKG showed SR but with significant artifact. He was admitted to IM for further management.   In talking with the patient and his wife, he reports has been having intermittent episodes of weakness for months now. Has had extensive neurological work up as well. Loop recorder thus far without significant arrhythmia noted. In regards to his chest pain, he states he generally has about one episode at least per week during which he takes SL nitro with improvement in symptoms. Chest discomfort is generally with activity, but sometimes at rest as well. Usually resolves with 1 SL nitro, but at times uses 2 before improvement. His biggest complaint is the weakness. States he has become very limited in his physical activity and when episodes happen he is generally "wiped out" for quite some time. States he had stopped his lasix for about a week prior to admission thinking this may have been contributing. Did develop worsening LE edema, and reported having trouble buttoning his pants 2/2 abd swelling.   Was given a dose of IV lasix 40mg  on admission, net - 1.6L, with improvement in his LE edema per his report.    Past Medical History:  Diagnosis Date  . Angina pectoris ([place])   . Anginal pain ([place])   . Arthritis   . Atrial fibrillation (  [place]) 09/15/2017  . Bilateral numbness and tingling of arms and legs 10/29/2015  . CAD (coronary artery disease) 09/28/2016  . Cardiogenic shock ([place])   . Cervical disc disease 10/29/2015  . Chest pain at rest 09/15/2017  . Chronic kidney disease (CKD), stage III (moderate) ([place]) 09/15/2017  . Coronary artery disease   . Encounter for therapeutic drug monitoring 10/14/2016  . History of TIA (transient ischemic attack) 07/27/2019  . Hx of CABG   . Hyperlipemia 09/24/2016  . Hyperlipidemia   .  Hypertension   . Ischemic cardiomyopathy 10/22/2016  . Myocardial infarction ([place])   . Neck pain 10/29/2015  . Non-ST elevation (NSTEMI) myocardial infarction ([place])   . NSTEMI (non-ST elevated myocardial infarction) ([place]) 09/24/2016  . Numbness in left leg 10/29/2015  . Paroxysmal tachycardia ([place])   . Pleural effusion   . Presence of aortocoronary bypass graft 10/08/2016   LIMA to LAD -SVG to DIAG SVG to RAMUS SVG to PDA  All grafts occluded except LIMA based on cardiac catheterization 07/2016  . Respiratory failure ([place])   . S/P CABG x 4 10/08/2016   LIMA to LAD -SVG to DIAG SVG to RAMUS SVG to PDA  All grafts occluded except LIMA based on cardiac catheterization 07/2016  . Weakness of both arms 10/29/2015    Past Surgical History:  Procedure Laterality Date  . CORONARY ARTERY BYPASS GRAFT N/A 09/28/2016   Procedure: CORONARY ARTERY BYPASS GRAFTING (CABG) x four, using left internal mammary artery and right leg greater saphenous vein harvested endoscopically;  Surgeon: Ivin Poot, MD;  Location: [facility];  Service: Open Heart Surgery;  Laterality: N/A;  . IR THORACENTESIS ASP PLEURAL SPACE W/IMG GUIDE  10/06/2016  . LEFT HEART CATH AND CORONARY ANGIOGRAPHY N/A 09/24/2016   Procedure: Left Heart Cath and Coronary Angiography;  Surgeon: Lorretta Harp, MD;  Location: [facility];  Service: Cardiovascular;  Laterality: N/A;  . LEFT HEART CATH AND CORS/GRAFTS ANGIOGRAPHY N/A 02/11/2017   Procedure: LEFT HEART CATH AND CORS/GRAFTS ANGIOGRAPHY;  Surgeon: Jettie Booze, MD;  Location: [facility];  Service: Cardiovascular;  Laterality: N/A;  . TEE WITHOUT CARDIOVERSION N/A 09/28/2016   Procedure: TRANSESOPHAGEAL ECHOCARDIOGRAM (TEE);  Surgeon: Prescott Gum, Collier Salina, MD;  Location: [facility];  Service: Open Heart Surgery;  Laterality: N/A;  . ULTRASOUND GUIDANCE FOR VASCULAR ACCESS  02/11/2017   Procedure: Ultrasound Guidance For Vascular Access;  Surgeon: Jettie Booze, MD;   Location: [place] CV [facility];  Service: Cardiovascular;;     Home Medications:  Prior to Admission medications   Medication Sig Start Date End Date Taking? Authorizing Provider  amLODipine (NORVASC) 5 MG tablet Take 1 tablet (5 mg total) by mouth daily. 01/29/20  Yes Park Liter, MD  aspirin 81 MG EC tablet Take 1 tablet (81 mg total) by mouth daily. 11/30/16  Yes Park Liter, MD  atorvastatin (LIPITOR) 80 MG tablet Take 1 tablet (80 mg total) by mouth daily. Patient taking differently: Take 80 mg by mouth at bedtime.  04/11/19  Yes Park Liter, MD  furosemide (LASIX) 40 MG tablet TAKE 1 TABLET DAILY Patient taking differently: Take 40 mg by mouth in the morning.  11/28/19  Yes Park Liter, MD  isosorbide mononitrate (IMDUR) 60 MG 24 hr tablet Take 3 tablets (180 mg total) by mouth daily. 04/11/19  Yes Park Liter, MD  memantine (NAMENDA) 5 MG tablet Take 5 mg by mouth 2 (two) times daily.  01/11/19  Yes [provider]  metoprolol tartrate (LOPRESSOR) 25 MG tablet Take 1 tablet (25 mg total) by mouth 2 (two) times daily. 11/24/19  Yes Park Liter, MD  nitroGLYCERIN (NITRODUR - DOSED IN MG/24 HR) 0.1 mg/hr patch PLACE 1 PATCH ON THE SKIN DAILY Patient taking differently: Place 0.1 mg onto the skin daily.  12/04/19  Yes Park Liter, MD  nitroGLYCERIN (NITROSTAT) 0.4 MG SL tablet Place 0.4 mg under the tongue every 5 (five) minutes as needed for chest pain.  01/31/20  Yes [provider]  pantoprazole (PROTONIX) 40 MG tablet Take 40 mg by mouth in the morning and at bedtime.  07/05/16  Yes [provider]  potassium chloride (KLOR-CON) 10 MEQ tablet Take 1 tablet (10 mEq total) by mouth daily. 11/28/19 02/26/20 Yes Park Liter, MD  ranolazine (RANEXA) 1000 MG SR tablet TAKE 1  BY MOUTH TWICE DAILY Patient taking differently: Take 1,000 mg by mouth 2 (two) times daily.  01/01/20  Yes Park Liter, MD  XARELTO  2.5 MG TABS tablet TAKE 1 TABLET BY MOUTH TWICE A DAY Patient taking differently: Take 2.5 mg by mouth 2 (two) times daily.  02/09/20  Yes Park Liter, MD  furosemide (LASIX) 20 MG tablet TAKE 1 TABLET BY MOUTH AS NEEDED FOR LOWER EXTREMITY SWELLING. Patient not taking: Reported on 02/25/2020 02/05/20   Park Liter, MD    Inpatient Medications: Scheduled Meds: . aspirin EC  81 mg Oral Daily  . atorvastatin  80 mg Oral q1800  . isosorbide mononitrate  180 mg Oral Daily  . memantine  5 mg Oral BID  . metoprolol tartrate  25 mg Oral BID  . pantoprazole  40 mg Oral Daily  . ranolazine  1,000 mg Oral BID  . rivaroxaban  2.5 mg Oral BID  . sodium chloride flush  3 mL Intravenous Q12H   Continuous Infusions: . sodium chloride     PRN Meds: sodium chloride, acetaminophen, ondansetron (ZOFRAN) IV, sodium chloride flush  Allergies:    Allergies  Allergen Reactions  . Benadryl [Diphenhydramine Hcl (Sleep)] Other (See Comments)    Reaction not recalled  . Levofloxacin Other (See Comments)    Tendon rupture     Social History:   Social History   Socioeconomic History  . Marital status: Married    Spouse name: Not on file  . Number of children: Not on file  . Years of education: Not on file  . Highest education level: Not on file  Occupational History  . Occupation: Retired  Tobacco Use  . Smoking status: Never Smoker  . Smokeless tobacco: Never Used  Vaping Use  . Vaping Use: Never used  Substance and Sexual Activity  . Alcohol use: No  . Drug use: No  . Sexual activity: Yes    Birth control/protection: None  Other Topics Concern  . Not on file  Social History Narrative  . Not on file   Social Determinants of Health   Financial Resource Strain:   . Difficulty of Paying Living Expenses: Not on file  Food Insecurity:   . Worried About Charity fundraiser in the Last Year: Not on file  . Ran Out of Food in the Last Year: Not on file  Transportation  Needs:   . Lack of Transportation (Medical): Not on file  . Lack of Transportation (Non-Medical): Not on file  Physical Activity:   . Days of Exercise per Week: Not on file  .  Minutes of Exercise per Session: Not on file  Stress:   . Feeling of Stress : Not on file  Social Connections:   . Frequency of Communication with Friends and Family: Not on file  . Frequency of Social Gatherings with Friends and Family: Not on file  . Attends Religious Services: Not on file  . Active Member of Clubs or Organizations: Not on file  . Attends Archivist Meetings: Not on file  . Marital Status: Not on file  Intimate Partner Violence:   . Fear of Current or Ex-Partner: Not on file  . Emotionally Abused: Not on file  . Physically Abused: Not on file  . Sexually Abused: Not on file    Family History:    Family History  Problem Relation Age of Onset  . Heart failure Mother   . Heart attack Father      ROS:  Please see the history of present illness.   All other ROS reviewed and negative.     Physical Exam/Data:   Vitals:   02/25/20 2320 02/26/20 0004 02/26/20 0852 02/26/20 0855  BP: 139/81  106/66 106/66  Pulse: (!) 57  (!) 58 (!) 57  Resp: 18   16  Temp: 98.1 F (36.7 C)   98.2 F (36.8 C)  TempSrc: Oral   Oral  SpO2: 97%   96%  Weight:  91.6 kg    Height:  5\' 6"  (1.676 m)      Intake/Output Summary (Last 24 hours) at 02/26/2020 1113 Last data filed at 02/26/2020 0404 Gross per 24 hour  Intake 357 ml  Output 1975 ml  Net -1618 ml   Last 3 Weights 02/26/2020 02/25/2020 11/28/2019  Weight (lbs) 201 lb 14.4 oz 202 lb 13.2 oz 203 lb 3.2 oz  Weight (kg) 91.581 kg 92 kg 92.171 kg     Body mass index is 32.59 kg/m.  General:  Well nourished, well developed, in no acute distress HEENT: normal Lymph: no adenopathy Neck: no JVD Endocrine:  No thryomegaly Vascular: No carotid bruits Cardiac:  normal S1, S2; RRR; no murmur  Lungs:  clear to auscultation bilaterally, no  wheezing, rhonchi or rales  Abd: soft, nontender, no hepatomegaly  Ext: no edema Musculoskeletal:  No deformities, BUE and BLE strength normal and equal Skin: warm and dry  Neuro:  CNs 2-12 intact, no focal abnormalities noted Psych:  Normal affect   EKG:  The EKG was personally reviewed and demonstrates:  SR with artifact  Relevant CV Studies:  Cath: 01/2017   RPDA lesion, 90 %stenosed.  Ost Ramus lesion, 95 %stenosed.  1st Diag lesion, 95 %stenosed.  Ost LAD lesion, 70 %stenosed.  Ost RPDA to RPDA lesion, 95 %stenosed.  SVG.  Origin lesion, 100 %stenosed.  Ramus lesion, 80 %stenosed.  Lat Ramus lesion, 100 %stenosed.  Origin lesion, 100 %stenosed.  2nd Mrg lesion, 100 %stenosed.  Dist LAD lesion, 100 %stenosed.  The left ventricular systolic function is normal.  LV end diastolic pressure is normal.  The left ventricular ejection fraction is 55-65% by visual estimate.  There is no aortic valve stenosis.   Severe three vessel CAD involving ostial and distal LAD, ostial and mid ramus, occluded OM, and severe diffuse PDA disease.  RCA is large dominant vessel and PLA has only moderate disease, supplying lateral wall.   3/4 grafts occluded.  Normal LV function.    Consider CTO PCI of the LAD if he fails medical therapy.    Diagnostic Dominance: Right  Echo: 11/2018  IMPRESSIONS    1. The left ventricle has normal systolic function with an ejection  fraction of 60-65%. The cavity size was normal. Left ventricular diastolic  Doppler parameters are consistent with impaired relaxation. Elevated left  ventricular end-diastolic pressure  No evidence of left ventricular regional wall motion abnormalities.  2. The right ventricle has normal systolic function. The cavity was  normal. There is no increase in right ventricular wall thickness.  3. The aortic valve is tricuspid. Mild thickening of the aortic valve.  Mild focal calcification of the  aortic valve. Aortic valve regurgitation  was not assessed by color flow Doppler.  4. There is mild dilatation of the ascending aorta measuring 39 mm.  5. No evidence of mitral valve stenosis.  6. The aorta is normal in size and structure.  7. The aortic root and ascending aorta are normal in size and structure.   Laboratory Data:  High Sensitivity Troponin:   Recent Labs  Lab 02/25/20 1228 02/25/20 1438  TROPONINIHS 6 5     Chemistry Recent Labs  Lab 02/25/20 1228 02/26/20 0046  NA 137 137  K 4.3 3.6  CL 105 103  CO2 23 24  GLUCOSE 98 136*  BUN 12 12  CREATININE 1.13 1.41*  CALCIUM 8.9 9.3  GFRNONAA >60 53*  ANIONGAP 9 10    Recent Labs  Lab 02/25/20 1228  PROT 5.9*  ALBUMIN 3.3*  AST 19  ALT 24  ALKPHOS 58  BILITOT 1.1   Hematology Recent Labs  Lab 02/25/20 1228  WBC 7.7  RBC 4.26  HGB 13.7  HCT 40.9  MCV 96.0  MCH 32.2  MCHC 33.5  RDW 13.3  PLT 161   BNP Recent Labs  Lab 02/25/20 1228  BNP 102.1*    DDimer No results for input(s): DDIMER in the last 168 hours.   Radiology/Studies:  DG Chest 2 View  Result Date: 02/25/2020 CLINICAL DATA:  Chest pain/pressure, shortness of breath, and generalized weakness for 1 hour, elevated blood pressure EXAM: CHEST - 2 VIEW COMPARISON:  06/04/2019 FINDINGS: Loop recorder projects over LEFT chest. Normal heart size post CABG. Mediastinal contours and pulmonary vascularity normal. Lungs clear. No infiltrate, pleural effusion, or pneumothorax. Osseous structures unremarkable. IMPRESSION: No acute abnormalities. Electronically Signed   By: Lavonia Dana M.D.   On: 02/25/2020 12:28     Assessment and Plan:   Jaymian Bogart is a 74 y.o. male with a hx of CAD s/p CABG '18 (all grafts occluded except LIMA-LAD on cath 01/2017), HTN, HLD and TIA who is being seen today for the evaluation of chest pain at the request of Dr. Aileen Fass.  1. Weakness/chest discomfort: seem he has been struggling with ongoing  weakness for quite some time. Dx with TIA back in 05/2019, since has had a loop recorder placed, but no Afib noted. Does have intermittent episodes of chest discomfort but these seem to be quickly responsive to SL nitro. On admission his hsTn were essentially negative. EKG without notable ischemia. Does have known coronary disease with suggestion to consider CTO if failed medical therapy. Given his ongoing symptoms, this weakness could be his anginal equivalent. Would be reasonable to restudy with cardiac cath to determine if new lesion present. Was given Xarelto today, therefore will plan for cath tomorrow.  -- The patient understands that risks included but are not limited to stroke (1 in 1000), death (1 in 58), kidney failure [usually temporary] (1 in 500), bleeding (1 in 200),  allergic reaction [possibly serious] (1 in 200).  --hold Xarelto ( low dose 2.5mg  BID vascular dose) -- NPO at midnight  2. CAD s/p CABG: underwent CABG 2018, follow up cath 01/2017 with 3/4 down grafts. Has been on good medical therapy with ASA, statin, BB, Imdur, Ranexa, and amlodipine. If no acute lesion noted, may need to back down on medical therapy to determine if this helps with symptoms.   3. Chronic diastolic HF: echo 10/621 with normal EF. Had stopped his lasix about one week prior to admission. Given IV lasix 40mg  x1 on admission, with improvement. UOP 1.6L noted.  4. HTN: stable blood pressures on current therapy  5. HLD: on statin therapy  6. OSA: on Cpap  For questions or updates, please contact [place] Please consult [email or web address] for contact info under    Signed, Reino Bellis, NP  02/26/2020 11:13 AM    Patient seen and examined. Agree with assessment and plan.  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  He has established CAD and is followed by Dr. Agustin Cree.  In June 2018 he was found to have significant multivessel CAD and underwent CABG  revascularization surgery x4 with a LIMA to LAD, SVG to diagonal, SVG to ramus, and SVG to PDA.  He developed unstable angina in October 2018 and underwent repeat catheterization where he was found to have occlusion of all vein grafts and although his LIMA was patent the LAD beyond the LIMA insertion was occluded.  Since that time he has been on medical management and medications have been progressively increased to now include isosorbide 180 mg daily, metoprolol tartrate 25 mg twice a day, ranolazine 1000 mg twice a day, and he also is on low-dose rivaroxaban at 2.5 mg twice daily.  He has noticed progressive weakness.  There is a prior history of TIA.  He has a loop recorder in place but atrial fibrillation has never been detected.  Over the past 6 months he has been extremely limited with his ability to do any activity due to completely wearing out.  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  He has experienced episodes of chest tightness intermittently but his predominant complaint is that of just being completely debilitated and inability to be active due to shortness of breath and weakness.  Presently he is not having any chest pain.  He had held his furosemide dose for several days prior to his episode on Sunday.  On exam he is in no acute distress.  He is hard of hearing.  Blood pressure today was 106/66 with him in sinus rhythm with a pulse of 58.  There is no JVD.  His lungs are clear.  There is no chest wall tenderness to palpation.  Abdomen was nontender.  Bowel sounds are positive.  There was no edema.  Distal pulses were intact.  Neurologic evaluation was grossly normal.  He had normal affect.  His ECG shows normal sinus rhythm at 66 bpm with baseline artifact.  There is evidence for left anterior hemiblock and poor R wave progression.  QTc interval is 452 ms.  I had a long discussion with both  Ernest Booker and his wife.  The patient has essentially become markedly debilitated predominantly due to increasing weakness.  It is certainly possible this is nonischemic particularly since he has had subsequent  stress test that were interpreted as low risk and had normal LV function.  However, it has been 3 years since his last catheterization and he certainly possible that he may have developed progressive more proximal disease contributing to some of his symptomatology.  Alternatively some of the medications may be a contributor in his weakness.  After much discussion concerning risks and benefits I have recommended definitive repeat cardiac catheterization to delineate his coronary anatomy.  Once his anatomy is known, further discussion regarding optimal treatment such as potential redo CABG revascularization, percutaneous revascularization or medication lowering can be considered.  The patient has been on low-dose of Xarelto at just 2.5 mg twice a day.  This will be held in anticipation of his catheterization tomorrow afternoon.  We will recheck lipid studies and if LDL is above 70 add Zetia to his current atorvastatin 80 mg.   Troy Sine, MD, [facility] 02/26/2020 2:33 PM

## 2020-02-26 NOTE — Progress Notes (Signed)
Lower extremity venous has been completed.   Preliminary results in CV Proc.   Abram Sander 02/26/2020 11:37 AM

## 2020-02-26 NOTE — Progress Notes (Signed)
[facility] PROGRESS NOTE    Progress Note  Ernest Booker  MBW:[id number] DOB: [birth year] DOA: 02/25/2020 PCP: Ernest Kiel, MD     Brief Narrative:   Ernest Booker is an 74 y.o. male past medical history significant of ischemic cardiomyopathy, atrial fibrillation on Xarelto CAD with CABG recent study showed bypass occluded except for the LIMA, stable angina essential hypertension chronic kidney disease stage II came in with fatigue and weakness that started 1 week prior to admission  Assessment/Plan:   Generalized weakness and shortness of breath of unclear etiology: Weight is about the same, BNP was 102 chest x-ray shows no pulmonary edema, EKG showed no signs of ACS, 48 biomarkers have remain low, hemoglobin is 13 and TSH 1.7. Lasix yesterday his creatinine on admission was 1.1 it is increased to one 1.4. Hold Lasix. He relates his generalized weakness and shortness of breath get worse with exertion and better after 2 to 3 minutes of rest, relates his shortness of breath improved with nitroglycerin, I am concerned about unstable angina. Will probably need ischemic work-up as an inpatient.  Chronic diastolic heart failure Trace edema no orthopnea, no JVD, will hold his Lasix for today as his creatinine has bumped. He will probably need cardiac cath will be a good idea for his creatinine to trend down.  CAD with a history of CABG Continue aspirin, indoor, Ranexa and metoprolol. Probably need ischemic work-up. See above for further details.  Essential hypertension Is relatively well-controlled continue current regimen.  Chronic atrial fibrillation Currently in sinus rhythm, continue metoprolol and Xarelto.  Peripheral edema Lower extremity Doppler to rule out DVT is pending.  Obstructive sleep apnea Continue CPAP.   DVT prophylaxis: Xarelto Family Communication:none Status is: Inpatient  Remains inpatient appropriate because:Hemodynamically unstable   Dispo:  The patient is from: Home              Anticipated d/c is to: Home              Anticipated d/c date is: 2 days              Patient currently is not medically stable to d/c.        Code Status:     Code Status Orders  (From admission, onward)         Start     Ordered   02/25/20 1841  Full code  Continuous        02/25/20 1842        Code Status History    Date Active Date Inactive Code Status Order ID Comments User Context   02/11/2017 1944 02/12/2017 2015 Full Code [id number]  Jettie Booze, MD Inpatient   09/28/2016 1429 10/10/2016 1709 Full Code [id number]  Nani Skillern, PA-C Inpatient   09/24/2016 1513 09/28/2016 1428 Full Code [id number]  Lorretta Harp, MD Inpatient   09/24/2016 1513 09/24/2016 1513 Full Code [id number]  Cheryln Manly, NP Inpatient   Advance Care Planning Activity        IV Access:    Peripheral IV   Procedures and diagnostic studies:   DG Chest 2 View  Result Date: 02/25/2020 CLINICAL DATA:  Chest pain/pressure, shortness of breath, and generalized weakness for 1 hour, elevated blood pressure EXAM: CHEST - 2 VIEW COMPARISON:  06/04/2019 FINDINGS: Loop recorder projects over LEFT chest. Normal heart size post CABG. Mediastinal contours and pulmonary vascularity normal. Lungs clear. No infiltrate, pleural effusion, or pneumothorax. Osseous structures unremarkable. IMPRESSION: No acute abnormalities. Electronically  Signed   By: Lavonia Dana M.D.   On: 02/25/2020 12:28     Medical Consultants:    None.  Anti-Infectives:   none  Subjective:    Ernest Booker relates he continues to feel generalized fatigue  Objective:    Vitals:   02/25/20 2200 02/25/20 2233 02/25/20 2320 02/26/20 0004  BP: 136/71 (!) 141/75 139/81   Pulse: (!) 59 95 (!) 57   Resp: 13 14 18    Temp:  97.9 F (36.6 C) 98.1 F (36.7 C)   TempSrc:  Oral Oral   SpO2: 97% 96% 97%   Weight:    91.6 kg  Height:    5\' 6"  (1.676 m)   SpO2: 97  %   Intake/Output Summary (Last 24 hours) at 02/26/2020 0656 Last data filed at 02/26/2020 0404 Gross per 24 hour  Intake 357 ml  Output 1975 ml  Net -1618 ml   Filed Weights   02/25/20 1210 02/26/20 0004  Weight: 92 kg 91.6 kg    Exam: General exam: In no acute distress. Respiratory system: Good air movement and clear to auscultation. Cardiovascular system: S1 & S2 heard, RRR. No JVD. Gastrointestinal system: Abdomen is nondistended, soft and nontender.  Extremities: No pedal edema. Skin: No rashes, lesions or ulcers Psychiatry: Judgement and insight appear normal. Mood & affect appropriate.    Data Reviewed:    Labs: Basic Metabolic Panel: Recent Labs  Lab 02/25/20 1228 02/26/20 0046  NA 137 137  K 4.3 3.6  CL 105 103  CO2 23 24  GLUCOSE 98 136*  BUN 12 12  CREATININE 1.13 1.41*  CALCIUM 8.9 9.3  MG  --  2.1   GFR Estimated Creatinine Clearance: 50.2 mL/min (A) (by C-G formula based on SCr of 1.41 mg/dL (H)). Liver Function Tests: Recent Labs  Lab 02/25/20 1228  AST 19  ALT 24  ALKPHOS 58  BILITOT 1.1  PROT 5.9*  ALBUMIN 3.3*   No results for input(s): LIPASE, AMYLASE in the last 168 hours. No results for input(s): AMMONIA in the last 168 hours. Coagulation profile No results for input(s): INR, PROTIME in the last 168 hours. COVID-19 Labs  No results for input(s): DDIMER, FERRITIN, LDH, CRP in the last 72 hours.  Lab Results  Component Value Date   [place] NEGATIVE 02/25/2020    CBC: Recent Labs  Lab 02/25/20 1228  WBC 7.7  HGB 13.7  HCT 40.9  MCV 96.0  PLT 161   Cardiac Enzymes: No results for input(s): CKTOTAL, CKMB, CKMBINDEX, TROPONINI in the last 168 hours. BNP (last 3 results) Recent Labs    10/24/19 1135  PROBNP 169   CBG: No results for input(s): GLUCAP in the last 168 hours. D-Dimer: No results for input(s): DDIMER in the last 72 hours. Hgb A1c: No results for input(s): HGBA1C in the last 72 hours. Lipid  Profile: No results for input(s): CHOL, HDL, LDLCALC, TRIG, CHOLHDL, LDLDIRECT in the last 72 hours. Thyroid function studies: Recent Labs    02/25/20 1522  TSH 1.771   Anemia work up: No results for input(s): VITAMINB12, FOLATE, FERRITIN, TIBC, IRON, RETICCTPCT in the last 72 hours. Sepsis Labs: Recent Labs  Lab 02/25/20 1228  WBC 7.7   Microbiology Recent Results (from the past 240 hour(s))  Respiratory Panel by RT PCR (Flu A&B, Covid) - Nasopharyngeal Swab     Status: None   Collection Time: 02/25/20 12:30 PM   Specimen: Nasopharyngeal Swab  Result Value Ref Range Status  SARS Coronavirus 2 by RT PCR NEGATIVE NEGATIVE Final    Comment: (NOTE) SARS-CoV-2 target nucleic acids are NOT DETECTED.  The SARS-CoV-2 RNA is generally detectable in upper respiratoy specimens during the acute phase of infection. The lowest concentration of SARS-CoV-2 viral copies this assay can detect is 131 copies/mL. A negative result does not preclude SARS-Cov-2 infection and should not be used as the sole basis for treatment or other patient management decisions. A negative result may occur with  improper specimen collection/handling, submission of specimen other than nasopharyngeal swab, presence of viral mutation(s) within the areas targeted by this assay, and inadequate number of viral copies (<131 copies/mL). A negative result must be combined with clinical observations, patient history, and epidemiological information. The expected result is Negative.  Fact Sheet for Patients:  PinkCheek.be  Fact Sheet for Healthcare Providers:  GravelBags.it  This test is no t yet approved or cleared by the [place] FDA and  has been authorized for detection and/or diagnosis of SARS-CoV-2 by FDA under an Emergency Use Authorization (EUA). This EUA will remain  in effect (meaning this test can be used) for the duration of the COVID-19  declaration under Section 564(b)(1) of the Act, 21 U.S.C. section 360bbb-3(b)(1), unless the authorization is terminated or revoked sooner.     Influenza A by PCR NEGATIVE NEGATIVE Final   Influenza B by PCR NEGATIVE NEGATIVE Final    Comment: (NOTE) The Xpert Xpress SARS-CoV-2/FLU/RSV assay is intended as an aid in  the diagnosis of influenza from Nasopharyngeal swab specimens and  should not be used as a sole basis for treatment. Nasal washings and  aspirates are unacceptable for Xpert Xpress SARS-CoV-2/FLU/RSV  testing.  Fact Sheet for Patients: PinkCheek.be  Fact Sheet for Healthcare Providers: GravelBags.it  This test is not yet approved or cleared by the [place] FDA and  has been authorized for detection and/or diagnosis of SARS-CoV-2 by  FDA under an Emergency Use Authorization (EUA). This EUA will remain  in effect (meaning this test can be used) for the duration of the  Covid-19 declaration under Section 564(b)(1) of the Act, 21  U.S.C. section 360bbb-3(b)(1), unless the authorization is  terminated or revoked. Performed at [facility], [place] [address] [place], [place] [address]      Medications:   . aspirin EC  81 mg Oral Daily  . atorvastatin  80 mg Oral QHS  . furosemide  40 mg Intravenous Daily  . isosorbide mononitrate  180 mg Oral Daily  . memantine  5 mg Oral BID  . metoprolol tartrate  25 mg Oral BID  . pantoprazole  40 mg Oral Daily  . ranolazine  1,000 mg Oral BID  . rivaroxaban  2.5 mg Oral BID  . sodium chloride flush  3 mL Intravenous Q12H   Continuous Infusions: . sodium chloride        LOS: 1 day   Charlynne Cousins  [facility]  02/26/2020, 6:56 AM

## 2020-02-26 NOTE — Progress Notes (Signed)
Pt stated he does wear a CPAP at home. Pt doesn't wish to wear a hospital CPAP.  No distress noted

## 2020-02-26 NOTE — Progress Notes (Signed)
PT Cancellation Note  Patient Details Name: Ernest Booker MRN: [id number] DOB: 11-07-1945   Cancelled Treatment:    Reason Eval/Treat Not Completed: Other (comment). Pt reports he has been up in the room independently. Will defer eval for now but will check back on later in week after likely cardiac work-up/cath to make sure he is still moving ok.    Shary Decamp [facility] 02/26/2020, 3:36 PM [place] Pager [phone] Office [phone]

## 2020-02-27 ENCOUNTER — Encounter (HOSPITAL_COMMUNITY): Payer: Self-pay | Admitting: Cardiology

## 2020-02-27 ENCOUNTER — Encounter (HOSPITAL_COMMUNITY)
Admission: EM | Disposition: A | Discharge status: Home / Self Care | Payer: Self-pay | Source: Home / Self Care | Attending: Internal Medicine

## 2020-02-27 DIAGNOSIS — I25119 Atherosclerotic heart disease of native coronary artery with unspecified angina pectoris: Secondary | ICD-10-CM

## 2020-02-27 DIAGNOSIS — R0602 Shortness of breath: Secondary | ICD-10-CM

## 2020-02-27 DIAGNOSIS — R531 Weakness: Secondary | ICD-10-CM

## 2020-02-27 DIAGNOSIS — E785 Hyperlipidemia, unspecified: Secondary | ICD-10-CM

## 2020-02-27 HISTORY — PX: LEFT HEART CATH AND CORS/GRAFTS ANGIOGRAPHY: CATH118250

## 2020-02-27 LAB — LIPID PANEL
Cholesterol: 101 mg/dL (ref 0–200)
HDL: 36 mg/dL — ABNORMAL LOW (ref >40)
LDL Cholesterol: 46 mg/dL (ref 0–99)
Total CHOL/HDL Ratio: 2.8 RATIO
Triglycerides: 97 mg/dL (ref <150)
VLDL: 19 mg/dL (ref 0–40)

## 2020-02-27 LAB — BASIC METABOLIC PANEL
Anion gap: 8 (ref 5–15)
BUN: 21 mg/dL (ref 8–23)
CO2: 26 mmol/L (ref 22–32)
Calcium: 9.1 mg/dL (ref 8.9–10.3)
Chloride: 104 mmol/L (ref 98–111)
Creatinine, Ser: 1.43 mg/dL — ABNORMAL HIGH (ref 0.61–1.24)
GFR, Estimated: 52 mL/min — ABNORMAL LOW (ref >60)
Glucose, Bld: 94 mg/dL (ref 70–99)
Potassium: 4.2 mmol/L (ref 3.5–5.1)
Sodium: 138 mmol/L (ref 135–145)

## 2020-02-27 SURGERY — LEFT HEART CATH AND CORS/GRAFTS ANGIOGRAPHY
Anesthesia: LOCAL

## 2020-02-27 MED ORDER — SODIUM CHLORIDE 0.9 % IV SOLN: INTRAVENOUS | Status: AC

## 2020-02-27 MED ORDER — METOPROLOL TARTRATE 12.5 MG HALF TABLET
12.5000 mg | ORAL_TABLET | Freq: Two times a day (BID) | ORAL | Status: DC
  Administered 2020-02-27 – 2020-02-28 (×2): 12.5 mg via ORAL
  Filled 2020-02-27 (×2): qty 1

## 2020-02-27 MED ORDER — SODIUM CHLORIDE 0.9 % IV SOLN: 250.0000 mL | INTRAVENOUS | Status: DC | PRN

## 2020-02-27 MED ORDER — SODIUM CHLORIDE 0.9% FLUSH
3.0000 mL | Freq: Two times a day (BID) | INTRAVENOUS | Status: DC
  Administered 2020-02-28: 3 mL via INTRAVENOUS

## 2020-02-27 MED ORDER — HEPARIN (PORCINE) IN NACL 1000-0.9 UT/500ML-% IV SOLN
INTRAVENOUS | Status: AC
  Filled 2020-02-27: qty 500

## 2020-02-27 MED ORDER — MIDAZOLAM HCL 2 MG/2ML IJ SOLN
INTRAMUSCULAR | Status: DC | PRN
  Administered 2020-02-27: 1 mg via INTRAVENOUS

## 2020-02-27 MED ORDER — LIDOCAINE HCL (PF) 1 % IJ SOLN
INTRAMUSCULAR | Status: DC | PRN
  Administered 2020-02-27: 2 mL via INTRADERMAL

## 2020-02-27 MED ORDER — HEPARIN SODIUM (PORCINE) 1000 UNIT/ML IJ SOLN
INTRAMUSCULAR | Status: DC | PRN
  Administered 2020-02-27: 5000 [IU] via INTRAVENOUS

## 2020-02-27 MED ORDER — MIDAZOLAM HCL 2 MG/2ML IJ SOLN
INTRAMUSCULAR | Status: AC
  Filled 2020-02-27: qty 2

## 2020-02-27 MED ORDER — HEPARIN SODIUM (PORCINE) 1000 UNIT/ML IJ SOLN
INTRAMUSCULAR | Status: AC
  Filled 2020-02-27: qty 1

## 2020-02-27 MED ORDER — HEPARIN (PORCINE) IN NACL 1000-0.9 UT/500ML-% IV SOLN
INTRAVENOUS | Status: DC | PRN
  Administered 2020-02-27 (×2): 500 mL

## 2020-02-27 MED ORDER — SODIUM CHLORIDE 0.9% FLUSH: 3.0000 mL | INTRAVENOUS | Status: DC | PRN

## 2020-02-27 MED ORDER — IOHEXOL 350 MG/ML SOLN
INTRAVENOUS | Status: AC
  Filled 2020-02-27: qty 1

## 2020-02-27 MED ORDER — IOHEXOL 350 MG/ML SOLN
INTRAVENOUS | Status: DC | PRN
  Administered 2020-02-27: 75 mL via INTRA_ARTERIAL

## 2020-02-27 MED ORDER — FENTANYL CITRATE (PF) 100 MCG/2ML IJ SOLN
INTRAMUSCULAR | Status: DC | PRN
  Administered 2020-02-27: 25 ug via INTRAVENOUS

## 2020-02-27 MED ORDER — LIDOCAINE HCL (PF) 1 % IJ SOLN
INTRAMUSCULAR | Status: AC
  Filled 2020-02-27: qty 30

## 2020-02-27 MED ORDER — HEPARIN SODIUM (PORCINE) 5000 UNIT/ML IJ SOLN
5000.0000 [IU] | Freq: Three times a day (TID) | INTRAMUSCULAR | Status: DC
  Administered 2020-02-27 – 2020-02-28 (×2): 5000 [IU] via SUBCUTANEOUS
  Filled 2020-02-27 (×2): qty 1

## 2020-02-27 MED ORDER — SODIUM CHLORIDE 0.9 % WEIGHT BASED INFUSION
1.0000 mL/kg/h | INTRAVENOUS | Status: AC
  Administered 2020-02-27: 1 mL/kg/h via INTRAVENOUS

## 2020-02-27 MED ORDER — VERAPAMIL HCL 2.5 MG/ML IV SOLN
INTRAVENOUS | Status: DC | PRN
  Administered 2020-02-27: 10 mL via INTRA_ARTERIAL

## 2020-02-27 MED ORDER — FENTANYL CITRATE (PF) 100 MCG/2ML IJ SOLN
INTRAMUSCULAR | Status: AC
  Filled 2020-02-27: qty 2

## 2020-02-27 MED ORDER — ROSUVASTATIN CALCIUM 20 MG PO TABS
20.0000 mg | ORAL_TABLET | Freq: Every day | ORAL | Status: DC
  Administered 2020-02-27: 20 mg via ORAL
  Filled 2020-02-27: qty 1

## 2020-02-27 MED ORDER — VERAPAMIL HCL 2.5 MG/ML IV SOLN
INTRAVENOUS | Status: AC
  Filled 2020-02-27: qty 2

## 2020-02-27 SURGICAL SUPPLY — 12 items
CATH INFINITI 5 FR IM (CATHETERS) ×1 IMPLANT
CATH INFINITI 5FR JK (CATHETERS) ×1 IMPLANT
CATH INFINITI 5FR MULTPACK ANG (CATHETERS) ×1 IMPLANT
DEVICE RAD COMP TR BAND LRG (VASCULAR PRODUCTS) ×1 IMPLANT
GLIDESHEATH SLEND SS 6F .021 (SHEATH) ×1 IMPLANT
GUIDEWIRE INQWIRE 1.5J.035X260 (WIRE) IMPLANT
INQWIRE 1.5J .035X260CM (WIRE) ×2
KIT HEART LEFT (KITS) ×2 IMPLANT
PACK CARDIAC CATHETERIZATION (CUSTOM PROCEDURE TRAY) ×2 IMPLANT
SYR MEDRAD MARK 7 150ML (SYRINGE) ×2 IMPLANT
TRANSDUCER W/STOPCOCK (MISCELLANEOUS) ×2 IMPLANT
TUBING CIL FLEX 10 FLL-RA (TUBING) ×2 IMPLANT

## 2020-02-27 NOTE — Progress Notes (Signed)
Patient returned from cath lab with TR band on left wrist; 24mL if air in band. Site clean, no bleeding/drainage and strong pulse felt. Color in left hand normal, sensation normal, capillary refill less than 3 seconds. O2 sensor on that hand reading 95% on RA. Vitals currently running every 37minutes.

## 2020-02-27 NOTE — Progress Notes (Signed)
[facility] PROGRESS NOTE    Progress Note  Adhrit Krenz  XTG:[id number] DOB: [birth year] DOA: 02/25/2020 PCP: [name], MD     Brief Narrative:   Ernest Booker is an 74 y.o. male past medical history significant of ischemic cardiomyopathy, atrial fibrillation on Xarelto CAD with CABG recent study showed bypass occluded except for the LIMA, stable angina essential hypertension chronic kidney disease stage II came in with fatigue and weakness that started 1 week prior to admission  Assessment/Plan:   Generalized weakness and shortness of breath of unclear etiology: Shortness of breath of unclear etiology, cardiology was consulted which agreed with cardiac cath which will be done today 02/27/2020. Holding Xarelto.  No evidence of DVT. Creatinine is still high 1.4 baseline around 10 start him on IV fluid hydration.  Chronic diastolic heart failure: Trace edema no orthopnea, no JVD, will hold his Lasix for today as his creatinine has bumped. He will probably need cardiac cath will be a good idea for his creatinine to trend down.  CAD with a history of CABG: Continue aspirin, indoor, Ranexa and metoprolol. Probably need ischemic work-up. See above for further details.  Essential hypertension Is relatively well-controlled continue current regimen.  Chronic atrial fibrillation: Currently in sinus rhythm, continue metoprolol and Xarelto.  Peripheral edema Lower extremity Doppler to rule out DVT is negative  Obstructive sleep apnea Continue CPAP.   DVT prophylaxis: Xarelto Family Communication:none Status is: Inpatient  Remains inpatient appropriate because:Hemodynamically unstable   Dispo: The patient is from: Home              Anticipated d/c is to: Home              Anticipated d/c date is: 2 days              Patient currently is not medically stable to d/c.        Code Status:     Code Status Orders  (From admission, onward)         Start      Ordered   02/25/20 1841  Full code  Continuous        02/25/20 1842        Code Status History    Date Active Date Inactive Code Status Order ID Comments User Context   02/11/2017 1944 02/12/2017 2015 Full Code [id number]  Jettie Booze, MD Inpatient   09/28/2016 1429 10/10/2016 1709 Full Code [id number]  Nani Skillern, PA-C Inpatient   09/24/2016 1513 09/28/2016 1428 Full Code [id number]  Lorretta Harp, MD Inpatient   09/24/2016 1513 09/24/2016 1513 Full Code [id number]  Cheryln Manly, NP Inpatient   Advance Care Planning Activity        IV Access:    Peripheral IV   Procedures and diagnostic studies:   DG Chest 2 View  Result Date: 02/25/2020 CLINICAL DATA:  Chest pain/pressure, shortness of breath, and generalized weakness for 1 hour, elevated blood pressure EXAM: CHEST - 2 VIEW COMPARISON:  06/04/2019 FINDINGS: Loop recorder projects over LEFT chest. Normal heart size post CABG. Mediastinal contours and pulmonary vascularity normal. Lungs clear. No infiltrate, pleural effusion, or pneumothorax. Osseous structures unremarkable. IMPRESSION: No acute abnormalities. Electronically Signed   By: Lavonia Dana M.D.   On: 02/25/2020 12:28   ECHOCARDIOGRAM COMPLETE  Result Date: 02/26/2020    ECHOCARDIOGRAM REPORT   Patient Name:   Ernest Booker Date of Exam: 02/26/2020 Medical Rec #:  [id number]   Height:  66.0 in Accession #:    [id number]  Weight:       201.9 lb Date of Birth:  1946/01/11   BSA:          2.008 m Patient Age:    28 years    BP:           106/66 mmHg Patient Gender: M           HR:           57 bpm. Exam Location:  Inpatient Procedure: 2D Echo, Cardiac Doppler and Color Doppler Indications:    CHF  History:        Patient has prior history of Echocardiogram examinations, most                 recent 05/26/2019. CAD and Previous Myocardial Infarction, Prior                 CABG, TIA, Arrythmias:Atrial Fibrillation; Risk                 Factors:Hypertension and  Dyslipidemia.  Sonographer:    Dustin Flock Referring Phys: [id number] [place]  1. Left ventricular ejection fraction, by estimation, is 55 to 60%. The left ventricle has normal function. The left ventricle has no regional wall motion abnormalities. Left ventricular diastolic parameters were normal.  2. Right ventricular systolic function is normal. The right ventricular size is normal. There is normal pulmonary artery systolic pressure.  3. Left atrial size was mildly dilated.  4. The mitral valve is normal in structure. No evidence of mitral valve regurgitation. No evidence of mitral stenosis.  5. The aortic valve is normal in structure. Aortic valve regurgitation is not visualized. No aortic stenosis is present.  6. The inferior vena cava is normal in size with greater than 50% respiratory variability, suggesting right atrial pressure of 3 mmHg. FINDINGS  Left Ventricle: Left ventricular ejection fraction, by estimation, is 55 to 60%. The left ventricle has normal function. The left ventricle has no regional wall motion abnormalities. The left ventricular internal cavity size was normal in size. There is  no left ventricular hypertrophy. Left ventricular diastolic parameters were normal. Right Ventricle: The right ventricular size is normal. No increase in right ventricular wall thickness. Right ventricular systolic function is normal. There is normal pulmonary artery systolic pressure. The tricuspid regurgitant velocity is 2.24 m/s, and  with an assumed right atrial pressure of 3 mmHg, the estimated right ventricular systolic pressure is 51.0 mmHg. Left Atrium: Left atrial size was mildly dilated. Right Atrium: Right atrial size was normal in size. Pericardium: There is no evidence of pericardial effusion. Mitral Valve: The mitral valve is normal in structure. No evidence of mitral valve regurgitation. No evidence of mitral valve stenosis. Tricuspid Valve: The tricuspid valve is normal in  structure. Tricuspid valve regurgitation is not demonstrated. No evidence of tricuspid stenosis. Aortic Valve: The aortic valve is normal in structure. Aortic valve regurgitation is not visualized. No aortic stenosis is present. Pulmonic Valve: The pulmonic valve was normal in structure. Pulmonic valve regurgitation is not visualized. No evidence of pulmonic stenosis. Aorta: The aortic root is normal in size and structure. Venous: The inferior vena cava is normal in size with greater than 50% respiratory variability, suggesting right atrial pressure of 3 mmHg. IAS/Shunts: No atrial level shunt detected by color flow Doppler.  TRICUSPID VALVE TV Peak grad:   20.1 mmHg TV Vmax:        2.24  m/s TR Peak grad:   20.1 mmHg TR Vmax:        224.00 cm/s Jenkins Rouge MD Electronically signed by Jenkins Rouge MD Signature Date/Time: 02/26/2020/3:15:11 PM    Final    VAS [place] LOWER EXTREMITY VENOUS (DVT)  Result Date: 02/26/2020  Lower Venous DVT Study Indications: Edema.  Comparison Study: no prior Performing Technologist: Abram Sander RVS  Examination Guidelines: A complete evaluation includes B-mode imaging, spectral Doppler, color Doppler, and power Doppler as needed of all accessible portions of each vessel. Bilateral testing is considered an integral part of a complete examination. Limited examinations for reoccurring indications may be performed as noted. The reflux portion of the exam is performed with the patient in reverse Trendelenburg.  +---------+---------------+---------+-----------+----------+--------------+ RIGHT    CompressibilityPhasicitySpontaneityPropertiesThrombus Aging +---------+---------------+---------+-----------+----------+--------------+ CFV      Full           Yes      Yes                                 +---------+---------------+---------+-----------+----------+--------------+ SFJ      Full                                                         +---------+---------------+---------+-----------+----------+--------------+ FV Prox  Full                                                        +---------+---------------+---------+-----------+----------+--------------+ FV Mid   Full                                                        +---------+---------------+---------+-----------+----------+--------------+ FV DistalFull                                                        +---------+---------------+---------+-----------+----------+--------------+ PFV      Full                                                        +---------+---------------+---------+-----------+----------+--------------+ POP      Full           Yes      Yes                                 +---------+---------------+---------+-----------+----------+--------------+ PTV      Full                                                        +---------+---------------+---------+-----------+----------+--------------+  PERO     Full                                                        +---------+---------------+---------+-----------+----------+--------------+   +---------+---------------+---------+-----------+----------+--------------+ LEFT     CompressibilityPhasicitySpontaneityPropertiesThrombus Aging +---------+---------------+---------+-----------+----------+--------------+ CFV      Full           Yes      Yes                                 +---------+---------------+---------+-----------+----------+--------------+ SFJ      Full                                                        +---------+---------------+---------+-----------+----------+--------------+ FV Prox  Full                                                        +---------+---------------+---------+-----------+----------+--------------+ FV Mid   Full                                                         +---------+---------------+---------+-----------+----------+--------------+ FV DistalFull                                                        +---------+---------------+---------+-----------+----------+--------------+ PFV      Full                                                        +---------+---------------+---------+-----------+----------+--------------+ POP      Full           Yes      Yes                                 +---------+---------------+---------+-----------+----------+--------------+ PTV      Full                                                        +---------+---------------+---------+-----------+----------+--------------+ PERO     Full                                                        +---------+---------------+---------+-----------+----------+--------------+  Summary: BILATERAL: - No evidence of deep vein thrombosis seen in the lower extremities, bilaterally. - No evidence of superficial venous thrombosis in the lower extremities, bilaterally. -No evidence of popliteal cyst, bilaterally.   *See table(s) above for measurements and observations. Electronically signed by Monica Martinez MD on 02/26/2020 at 3:31:06 PM.    Final      Medical Consultants:    None.  Anti-Infectives:   none  Subjective:    Ernest Booker relates he still continues to be fatigued.  Objective:    Vitals:   02/26/20 1722 02/26/20 2038 02/27/20 0025 02/27/20 0352  BP: 119/67 113/66 105/61 103/73  Pulse: (!) 52 (!) 57 (!) 50 (!) 50  Resp: 18 18 18 20   Temp: 97.6 F (36.4 C) 98 F (36.7 C) 97.8 F (36.6 C) 97.8 F (36.6 C)  TempSrc: Oral Oral Oral Oral  SpO2: 96% 96% 97% 96%  Weight:    90.2 kg  Height:       SpO2: 96 %   Intake/Output Summary (Last 24 hours) at 02/27/2020 0831 Last data filed at 02/27/2020 0700 Gross per 24 hour  Intake 240 ml  Output 875 ml  Net -635 ml   Filed Weights   02/25/20 1210 02/26/20 0004 02/27/20 0352    Weight: 92 kg 91.6 kg 90.2 kg    Exam: General exam: In no acute distress. Respiratory system: Good air movement and clear to auscultation. Cardiovascular system: S1 & S2 heard, RRR. No JVD. Gastrointestinal system: Abdomen is nondistended, soft and nontender.  Extremities: No pedal edema. Skin: No rashes, lesions or ulcers  Data Reviewed:    Labs: Basic Metabolic Panel: Recent Labs  Lab 02/25/20 1228 02/25/20 1228 02/26/20 0046 02/27/20 0343  NA 137  --  137 138  K 4.3   < > 3.6 4.2  CL 105  --  103 104  CO2 23  --  24 26  GLUCOSE 98  --  136* 94  BUN 12  --  12 21  CREATININE 1.13  --  1.41* 1.43*  CALCIUM 8.9  --  9.3 9.1  MG  --   --  2.1  --    < > = values in this interval not displayed.   GFR Estimated Creatinine Clearance: 49.1 mL/min (A) (by C-G formula based on SCr of 1.43 mg/dL (H)). Liver Function Tests: Recent Labs  Lab 02/25/20 1228  AST 19  ALT 24  ALKPHOS 58  BILITOT 1.1  PROT 5.9*  ALBUMIN 3.3*   No results for input(s): LIPASE, AMYLASE in the last 168 hours. No results for input(s): AMMONIA in the last 168 hours. Coagulation profile No results for input(s): INR, PROTIME in the last 168 hours. COVID-19 Labs  No results for input(s): DDIMER, FERRITIN, LDH, CRP in the last 72 hours.  Lab Results  Component Value Date   [place] NEGATIVE 02/25/2020    CBC: Recent Labs  Lab 02/25/20 1228  WBC 7.7  HGB 13.7  HCT 40.9  MCV 96.0  PLT 161   Cardiac Enzymes: No results for input(s): CKTOTAL, CKMB, CKMBINDEX, TROPONINI in the last 168 hours. BNP (last 3 results) Recent Labs    10/24/19 1135  PROBNP 169   CBG: No results for input(s): GLUCAP in the last 168 hours. D-Dimer: No results for input(s): DDIMER in the last 72 hours. Hgb A1c: No results for input(s): HGBA1C in the last 72 hours. Lipid Profile: Recent Labs    02/27/20 0343  CHOL 101  HDL  36*  LDLCALC 46  TRIG 97  CHOLHDL 2.8   Thyroid function  studies: Recent Labs    02/25/20 1522  TSH 1.771   Anemia work up: No results for input(s): VITAMINB12, FOLATE, FERRITIN, TIBC, IRON, RETICCTPCT in the last 72 hours. Sepsis Labs: Recent Labs  Lab 02/25/20 1228  WBC 7.7   Microbiology Recent Results (from the past 240 hour(s))  Respiratory Panel by RT PCR (Flu A&B, Covid) - Nasopharyngeal Swab     Status: None   Collection Time: 02/25/20 12:30 PM   Specimen: Nasopharyngeal Swab  Result Value Ref Range Status   SARS Coronavirus 2 by RT PCR NEGATIVE NEGATIVE Final    Comment: (NOTE) SARS-CoV-2 target nucleic acids are NOT DETECTED.  The SARS-CoV-2 RNA is generally detectable in upper respiratoy specimens during the acute phase of infection. The lowest concentration of SARS-CoV-2 viral copies this assay can detect is 131 copies/mL. A negative result does not preclude SARS-Cov-2 infection and should not be used as the sole basis for treatment or other patient management decisions. A negative result may occur with  improper specimen collection/handling, submission of specimen other than nasopharyngeal swab, presence of viral mutation(s) within the areas targeted by this assay, and inadequate number of viral copies (<131 copies/mL). A negative result must be combined with clinical observations, patient history, and epidemiological information. The expected result is Negative.  Fact Sheet for Patients:  PinkCheek.be  Fact Sheet for Healthcare Providers:  GravelBags.it  This test is no t yet approved or cleared by the [place] FDA and  has been authorized for detection and/or diagnosis of SARS-CoV-2 by FDA under an Emergency Use Authorization (EUA). This EUA will remain  in effect (meaning this test can be used) for the duration of the COVID-19 declaration under Section 564(b)(1) of the Act, 21 U.S.C. section 360bbb-3(b)(1), unless the authorization is terminated  or revoked sooner.     Influenza A by PCR NEGATIVE NEGATIVE Final   Influenza B by PCR NEGATIVE NEGATIVE Final    Comment: (NOTE) The Xpert Xpress SARS-CoV-2/FLU/RSV assay is intended as an aid in  the diagnosis of influenza from Nasopharyngeal swab specimens and  should not be used as a sole basis for treatment. Nasal washings and  aspirates are unacceptable for Xpert Xpress SARS-CoV-2/FLU/RSV  testing.  Fact Sheet for Patients: PinkCheek.be  Fact Sheet for Healthcare Providers: GravelBags.it  This test is not yet approved or cleared by the [place] FDA and  has been authorized for detection and/or diagnosis of SARS-CoV-2 by  FDA under an Emergency Use Authorization (EUA). This EUA will remain  in effect (meaning this test can be used) for the duration of the  Covid-19 declaration under Section 564(b)(1) of the Act, 21  U.S.C. section 360bbb-3(b)(1), unless the authorization is  terminated or revoked. Performed at [facility], [place] [address], [place], [place] [address]      Medications:   . [START ON 02/28/2020] aspirin EC  81 mg Oral Daily  . atorvastatin  80 mg Oral q1800  . isosorbide mononitrate  180 mg Oral Daily  . memantine  5 mg Oral BID  . metoprolol tartrate  25 mg Oral BID  . pantoprazole  40 mg Oral Daily  . ranolazine  1,000 mg Oral BID  . sodium chloride flush  3 mL Intravenous Q12H  . sodium chloride flush  3 mL Intravenous Q12H   Continuous Infusions: . sodium chloride    . sodium chloride    .  sodium chloride 1 mL/kg/hr (02/27/20 0500)      LOS: 2 days   Charlynne Cousins  [facility]  02/27/2020, 8:31 AM

## 2020-02-27 NOTE — Progress Notes (Signed)
Reassessed TR band and site, which appeared to have hemostasis. Began to release air at 25mL every 15 minutes since it was bleeding during previous attempts. All air removed, hemostasis maintained. Area cleansed lightly with normal saline, gauze and tegaderm applied. Will continue to monitor for any oozing or breakthrough bleeding at site and continue to remind patient not to use arm for ADLs at this time, but to call for assistance. Pulse strong, sensation and color normal.

## 2020-02-27 NOTE — Progress Notes (Signed)
Have checked on area for 57min x 2; will not remove any additional air for 15 more minutes to make sure clot has formed. Pule strong and palpable, sensation and color of left hand normal.

## 2020-02-27 NOTE — Interval H&P Note (Signed)
History and Physical Interval Note:  02/27/2020 10:17 AM  [name]  has presented today for surgery, with the diagnosis of chest pain - weakness.  The various methods of treatment have been discussed with the patient and family. After consideration of risks, benefits and other options for treatment, the patient has consented to  Procedure(s): LEFT HEART CATH AND CORS/GRAFTS ANGIOGRAPHY (N/A) as a surgical intervention.  The patient's history has been reviewed, patient examined, no change in status, stable for surgery.  I have reviewed the patient's chart and labs.  Questions were answered to the patient's satisfaction.   Cath Lab Visit (complete for each Cath Lab visit)  Clinical Evaluation Leading to the Procedure:   ACS: No.  Non-ACS:    Anginal Classification: CCS II  Anti-ischemic medical therapy: Maximal Therapy (2 or more classes of medications)  Non-Invasive Test Results: No non-invasive testing performed  Prior CABG: Previous CABG        [facility] 02/27/2020 10:17 AM

## 2020-02-27 NOTE — Progress Notes (Signed)
When 15 minutes was up and I went to remove another 42mL of air from TR band, area noted to be bleeding/oozing again. Added the last 34mL into TR band to stop the bleeding, cleaned area for better visualization and will continue to check every 15 minutes. Pulse remains strong, sensation and color normal.

## 2020-02-27 NOTE — Progress Notes (Signed)
See vital sign flowsheet for post cath vitals. Attempted to withdraw initial 69mL of air from TR band. Noted slight oozing of blood so reinserted the 2mL of air and will attempt to withdraw in another 15 minutes. Pulse and O2 remain strong, sensation and color normal. Patient has no complaints of discomfort at sight and is doing well not using that hand and keeping on the pillow.

## 2020-02-27 NOTE — Progress Notes (Signed)
Patient alert and oriented x 4 upon initial assessment this am. REsps even and unlabored, lung sounds quite diminished throughout, greater in bilateral lower lobes. HR regular and brady, distant heart sounds. Bowel sounds active x 4 quads; patient ustilized urinal and has not had BM since Sunday but denies feeling the need for one and/or constipation. Abdomen soft and nontender. Although patient had 4+ edema to BLE yesterday, both lower extremities are showing trace to none; this is despite no lasix administration yesterday. Patient is currently in cath lab for [facility] with sodium chloride running at 73mL/hr prior to procedure. Patient and spouse had opportunity to ask questions and were comfortable proceeding with the cath. Awaiting return to room at this time.

## 2020-02-27 NOTE — Progress Notes (Signed)
Again noted oozing of blood with removal of 73mL of air at 1315, so reapplied. After an additional 66min at 37mL, removed 3mL of air from TR band with no evidence of bleeding. So far, 9 mL of air removed with no bleeding noted. Pulse remains strong, color and sensation normal.

## 2020-02-27 NOTE — Progress Notes (Signed)
Progress Note  Patient Name: Ernest Booker Date of Encounter: 02/27/2020  Primary Cardiologist: Agustin Cree  Subjective   No recurrent chest pain  Inpatient Medications    Scheduled Meds: . [START ON 02/28/2020] aspirin EC  81 mg Oral Daily  . atorvastatin  80 mg Oral q1800  . heparin  5,000 Units Subcutaneous Q8H  . isosorbide mononitrate  180 mg Oral Daily  . memantine  5 mg Oral BID  . metoprolol tartrate  25 mg Oral BID  . pantoprazole  40 mg Oral Daily  . ranolazine  1,000 mg Oral BID  . sodium chloride flush  3 mL Intravenous Q12H  . sodium chloride flush  3 mL Intravenous Q12H  . sodium chloride flush  3 mL Intravenous Q12H   Continuous Infusions: . sodium chloride    . sodium chloride 75 mL/hr at 02/27/20 1253  . sodium chloride    . sodium chloride 1 mL/kg/hr (02/27/20 1343)   PRN Meds: sodium chloride, sodium chloride, acetaminophen, ondansetron (ZOFRAN) IV, sodium chloride flush, sodium chloride flush   Vital Signs    Vitals:   02/27/20 1230 02/27/20 1300 02/27/20 1400 02/27/20 1430  BP: 120/69 127/70 129/70 115/67  Pulse: (!) 50 (!) 50 (!) 52 (!) 50  Resp: 16 16 16 16   Temp:      TempSrc:      SpO2: 95% 94% 95% 94%  Weight:      Height:        Intake/Output Summary (Last 24 hours) at 02/27/2020 1608 Last data filed at 02/27/2020 1345 Gross per 24 hour  Intake 486 ml  Output 1200 ml  Net -714 ml    I/O since admission:   Filed Weights   02/25/20 1210 02/26/20 0004 02/27/20 0352  Weight: 92 kg 91.6 kg 90.2 kg    Telemetry    Sinus - Personally Reviewed  ECG    ECG (independently read by me):  Physical Exam   BP 115/67 (BP Location: Right Arm)   Pulse (!) 50   Temp 97.8 F (36.6 C) (Oral)   Resp 16   Ht 5\' 6"  (1.676 m)   Wt 90.2 kg Comment: scale A  SpO2 94%   BMI 32.09 kg/m  General: Alert, oriented, no distress.  Skin: normal turgor, no rashes, warm and dry HEENT: Normocephalic, atraumatic. Pupils equal round and reactive  to light; sclera anicteric; extraocular muscles intact;  Nose without nasal septal hypertrophy Mouth/Parynx benign; Mallinpatti scale Neck: No JVD, no carotid bruits; normal carotid upstroke Lungs: clear to ausculatation and percussion; no wheezing or rales Chest wall: without tenderness to palpitation Heart: PMI not displaced, RRR, s1 s2 normal, 1/6 systolic murmur, no diastolic murmur, no rubs, gallops, thrills, or heaves Abdomen: soft, nontender; no hepatosplenomehaly, BS+; abdominal aorta nontender and not dilated by palpation. Back: no CVA tenderness Pulses 2+ Musculoskeletal: full range of motion, normal strength, no joint deformities Extremities: no clubbing cyanosis or edema, Homan's sign negative  Neurologic: grossly nonfocal; Cranial nerves grossly wnl Psychologic: Normal mood and affect  Labs    Chemistry Recent Labs  Lab 02/25/20 1228 02/26/20 0046 02/27/20 0343  NA 137 137 138  K 4.3 3.6 4.2  CL 105 103 104  CO2 23 24 26   GLUCOSE 98 136* 94  BUN 12 12 21   CREATININE 1.13 1.41* 1.43*  CALCIUM 8.9 9.3 9.1  PROT 5.9*  --   --   ALBUMIN 3.3*  --   --   AST 19  --   --  ALT 24  --   --   ALKPHOS 58  --   --   BILITOT 1.1  --   --   GFRNONAA >60 53* 52*  ANIONGAP 9 10 8      Hematology Recent Labs  Lab 02/25/20 1228  WBC 7.7  RBC 4.26  HGB 13.7  HCT 40.9  MCV 96.0  MCH 32.2  MCHC 33.5  RDW 13.3  PLT 161    Cardiac EnzymesNo results for input(s): TROPONINI in the last 168 hours. No results for input(s): TROPIPOC in the last 168 hours.   BNP Recent Labs  Lab 02/25/20 1228  BNP 102.1*     DDimer No results for input(s): DDIMER in the last 168 hours.   Lipid Panel     Component Value Date/Time   CHOL 101 02/27/2020 0343   CHOL 96 (L) 09/14/2017 0840   TRIG 97 02/27/2020 0343   HDL 36 (L) 02/27/2020 0343   HDL 42 09/14/2017 0840   CHOLHDL 2.8 02/27/2020 0343   VLDL 19 02/27/2020 0343   LDLCALC 46 02/27/2020 0343   LDLCALC 32 09/14/2017  0840     Radiology    CARDIAC CATHETERIZATION  Result Date: 02/27/2020  RPDA-1 lesion is 70% stenosed.  RPDA-2 lesion is 90% stenosed.  Prox LAD lesion is 50% stenosed.  Dist LAD lesion is 100% stenosed.  Ramus-1 lesion is 70% stenosed.  Ramus-2 lesion is 90% stenosed.  2nd Mrg lesion is 100% stenosed.  LIMA graft was visualized by angiography and is normal in caliber.  The graft exhibits no disease.  The left ventricular systolic function is normal.  LV end diastolic pressure is normal.  The left ventricular ejection fraction is 55-65% by visual estimate.  1. 3 vessel obstructive CAD. Predominantly involving small distal branches. Known occlusion of all SVGs so these were not imaged. The LIMA to the distal LAD is patent. 2. Good LV function 3. Normal LVEDP 12 mm Hg. Plan; recommend continued medical therapy. Compared to prior cath in October 2018 the LIMA graft is better visualized. There is improved flow in the ramus branch. No new lesions to explain his recent symptoms.   ECHOCARDIOGRAM COMPLETE  Result Date: 02/26/2020    ECHOCARDIOGRAM REPORT   Patient Name:   ZELL HYLTON Date of Exam: 02/26/2020 Medical Rec #:  [id number]   Height:       66.0 in Accession #:    [id number]  Weight:       201.9 lb Date of Birth:  01/11/1946   BSA:          2.008 m Patient Age:    25 years    BP:           106/66 mmHg Patient Gender: M           HR:           57 bpm. Exam Location:  Inpatient Procedure: 2D Echo, Cardiac Doppler and Color Doppler Indications:    CHF  History:        Patient has prior history of Echocardiogram examinations, most                 recent 05/26/2019. CAD and Previous Myocardial Infarction, Prior                 CABG, TIA, Arrythmias:Atrial Fibrillation; Risk                 Factors:Hypertension and Dyslipidemia.  Sonographer:    Dustin Flock Referring  Phys: [id number] Lequita Halt IMPRESSIONS  1. Left ventricular ejection fraction, by estimation, is 55 to 60%. The left ventricle  has normal function. The left ventricle has no regional wall motion abnormalities. Left ventricular diastolic parameters were normal.  2. Right ventricular systolic function is normal. The right ventricular size is normal. There is normal pulmonary artery systolic pressure.  3. Left atrial size was mildly dilated.  4. The mitral valve is normal in structure. No evidence of mitral valve regurgitation. No evidence of mitral stenosis.  5. The aortic valve is normal in structure. Aortic valve regurgitation is not visualized. No aortic stenosis is present.  6. The inferior vena cava is normal in size with greater than 50% respiratory variability, suggesting right atrial pressure of 3 mmHg. FINDINGS  Left Ventricle: Left ventricular ejection fraction, by estimation, is 55 to 60%. The left ventricle has normal function. The left ventricle has no regional wall motion abnormalities. The left ventricular internal cavity size was normal in size. There is  no left ventricular hypertrophy. Left ventricular diastolic parameters were normal. Right Ventricle: The right ventricular size is normal. No increase in right ventricular wall thickness. Right ventricular systolic function is normal. There is normal pulmonary artery systolic pressure. The tricuspid regurgitant velocity is 2.24 m/s, and  with an assumed right atrial pressure of 3 mmHg, the estimated right ventricular systolic pressure is 06.3 mmHg. Left Atrium: Left atrial size was mildly dilated. Right Atrium: Right atrial size was normal in size. Pericardium: There is no evidence of pericardial effusion. Mitral Valve: The mitral valve is normal in structure. No evidence of mitral valve regurgitation. No evidence of mitral valve stenosis. Tricuspid Valve: The tricuspid valve is normal in structure. Tricuspid valve regurgitation is not demonstrated. No evidence of tricuspid stenosis. Aortic Valve: The aortic valve is normal in structure. Aortic valve regurgitation is not  visualized. No aortic stenosis is present. Pulmonic Valve: The pulmonic valve was normal in structure. Pulmonic valve regurgitation is not visualized. No evidence of pulmonic stenosis. Aorta: The aortic root is normal in size and structure. Venous: The inferior vena cava is normal in size with greater than 50% respiratory variability, suggesting right atrial pressure of 3 mmHg. IAS/Shunts: No atrial level shunt detected by color flow Doppler.  TRICUSPID VALVE TV Peak grad:   20.1 mmHg TV Vmax:        2.24 m/s TR Peak grad:   20.1 mmHg TR Vmax:        224.00 cm/s Jenkins Rouge MD Electronically signed by Jenkins Rouge MD Signature Date/Time: 02/26/2020/3:15:11 PM    Final    VAS [place] LOWER EXTREMITY VENOUS (DVT)  Result Date: 02/26/2020  Lower Venous DVT Study Indications: Edema.  Comparison Study: no prior Performing Technologist: Abram Sander RVS  Examination Guidelines: A complete evaluation includes B-mode imaging, spectral Doppler, color Doppler, and power Doppler as needed of all accessible portions of each vessel. Bilateral testing is considered an integral part of a complete examination. Limited examinations for reoccurring indications may be performed as noted. The reflux portion of the exam is performed with the patient in reverse Trendelenburg.  +---------+---------------+---------+-----------+----------+--------------+ RIGHT    CompressibilityPhasicitySpontaneityPropertiesThrombus Aging +---------+---------------+---------+-----------+----------+--------------+ CFV      Full           Yes      Yes                                 +---------+---------------+---------+-----------+----------+--------------+  SFJ      Full                                                        +---------+---------------+---------+-----------+----------+--------------+ FV Prox  Full                                                         +---------+---------------+---------+-----------+----------+--------------+ FV Mid   Full                                                        +---------+---------------+---------+-----------+----------+--------------+ FV DistalFull                                                        +---------+---------------+---------+-----------+----------+--------------+ PFV      Full                                                        +---------+---------------+---------+-----------+----------+--------------+ POP      Full           Yes      Yes                                 +---------+---------------+---------+-----------+----------+--------------+ PTV      Full                                                        +---------+---------------+---------+-----------+----------+--------------+ PERO     Full                                                        +---------+---------------+---------+-----------+----------+--------------+   +---------+---------------+---------+-----------+----------+--------------+ LEFT     CompressibilityPhasicitySpontaneityPropertiesThrombus Aging +---------+---------------+---------+-----------+----------+--------------+ CFV      Full           Yes      Yes                                 +---------+---------------+---------+-----------+----------+--------------+ SFJ      Full                                                        +---------+---------------+---------+-----------+----------+--------------+  FV Prox  Full                                                        +---------+---------------+---------+-----------+----------+--------------+ FV Mid   Full                                                        +---------+---------------+---------+-----------+----------+--------------+ FV DistalFull                                                         +---------+---------------+---------+-----------+----------+--------------+ PFV      Full                                                        +---------+---------------+---------+-----------+----------+--------------+ POP      Full           Yes      Yes                                 +---------+---------------+---------+-----------+----------+--------------+ PTV      Full                                                        +---------+---------------+---------+-----------+----------+--------------+ PERO     Full                                                        +---------+---------------+---------+-----------+----------+--------------+     Summary: BILATERAL: - No evidence of deep vein thrombosis seen in the lower extremities, bilaterally. - No evidence of superficial venous thrombosis in the lower extremities, bilaterally. -No evidence of popliteal cyst, bilaterally.   *See table(s) above for measurements and observations. Electronically signed by Monica Martinez MD on 02/26/2020 at 3:31:06 PM.    Final     Cardiac Studies   02/06/2017      CATH 02/27/2020   RPDA-1 lesion is 70% stenosed.  RPDA-2 lesion is 90% stenosed.  Prox LAD lesion is 50% stenosed.  Dist LAD lesion is 100% stenosed.  Ramus-1 lesion is 70% stenosed.  Ramus-2 lesion is 90% stenosed.  2nd Mrg lesion is 100% stenosed.  LIMA graft was visualized by angiography and is normal in caliber.  The graft exhibits no disease.  The left ventricular systolic function is normal.  LV end diastolic pressure is normal.  The left ventricular ejection fraction is 55-65% by visual estimate.   1. 3 vessel obstructive CAD. Predominantly  involving small distal branches. Known occlusion of all SVGs so these were not imaged. The LIMA to the distal LAD is patent.  2. Good LV function 3. Normal LVEDP 12 mm Hg.  Plan; recommend continued medical therapy. Compared to prior cath in October  2018 the LIMA graft is better visualized. There is improved flow in the ramus branch. No new lesions to explain his recent symptoms.    Intervention    Patient Profile     Jan Olano is a 74 y.o. male with a hx of CAD s/p CABG '18 (all grafts occluded except LIMA-LAD on cath 01/2017), HTN, HLD, and TIA who was for the evaluation of chest pain at the request of Dr. Aileen Fass.  Assessment & Plan   1. CAD s/p CABG: underwent CABG 2018, follow up cath 01/2017 demonstrated occluded vein grafts with patent LIMA.  Catheterization data was reviewed with Dr. Martinique in the Cath Lab.  Recommend medical therapy.  Fortunately, the catheterization did not reveal progressive disease in the proximal vessels compared to his prior cath.  LIMA graft flow was improved compared to previous evaluation.  Recommend continue medical therapy.  However with his profound weakness will decrease beta-blocker dose since his resting pulse has been running in the low 50s, and on a reduced dose he may note improved energy.   2. Chronic diastolic HF: echo 06/5463 with normal EF. Had stopped his lasix about one week prior to admission. Given IV lasix 40mg  x1 on admission.  I&O since admission -2232.  BNP 102.  3. HTN: stable blood pressures on current therapy  4. HLD: on statin therapy with LDL 46.  Since patient is on ranolazine, due to potential dose interaction with atorvastatin, will change statin to rosuvastatin due to less potential interaction.  5. OSA: on Cpap  Aim for dc tomorrow  Signed, Troy Sine, MD, [facility] 02/27/2020, 4:08 PM

## 2020-02-28 LAB — BASIC METABOLIC PANEL
Anion gap: 9 (ref 5–15)
BUN: 18 mg/dL (ref 8–23)
CO2: 23 mmol/L (ref 22–32)
Calcium: 8.7 mg/dL — ABNORMAL LOW (ref 8.9–10.3)
Chloride: 106 mmol/L (ref 98–111)
Creatinine, Ser: 1.25 mg/dL — ABNORMAL HIGH (ref 0.61–1.24)
GFR, Estimated: >60 mL/min (ref >60)
Glucose, Bld: 97 mg/dL (ref 70–99)
Potassium: 4.1 mmol/L (ref 3.5–5.1)
Sodium: 138 mmol/L (ref 135–145)

## 2020-02-28 MED ORDER — ISOSORBIDE MONONITRATE ER 60 MG PO TB24: 120.0000 mg | ORAL_TABLET | Freq: Every day | ORAL | Status: DC

## 2020-02-28 MED ORDER — ROSUVASTATIN CALCIUM 20 MG PO TABS: 20.0000 mg | ORAL_TABLET | Freq: Every day | ORAL | 1 refills | Status: DC

## 2020-02-28 MED ORDER — ISOSORBIDE MONONITRATE ER 60 MG PO TB24
120.0000 mg | ORAL_TABLET | Freq: Every day | ORAL | 1 refills | Status: DC
Start: 2020-02-28 — End: 2020-04-02

## 2020-02-28 MED ORDER — METOPROLOL TARTRATE 25 MG PO TABS
12.5000 mg | ORAL_TABLET | Freq: Two times a day (BID) | ORAL | 3 refills | Status: DC
Start: 2020-02-28 — End: 2021-09-24

## 2020-02-28 MED ORDER — RIVAROXABAN 2.5 MG PO TABS
2.5000 mg | ORAL_TABLET | Freq: Two times a day (BID) | ORAL | Status: DC
  Administered 2020-02-28: 2.5 mg via ORAL
  Filled 2020-02-28 (×2): qty 1

## 2020-02-28 NOTE — Evaluation (Signed)
Physical Therapy Evaluation Patient Details Name: Ernest Booker MRN: [id number] DOB: [birth year] Today's Date: 02/28/2020   History of Present Illness  Ernest Booker is an 74 y.o. male past medical history significant of ischemic cardiomyopathy, atrial fibrillation on Xarelto CAD with CABG recent study showed bypass occluded except for the LIMA, stable angina essential hypertension chronic kidney disease stage II came in with fatigue and weakness that started 1 week prior to admission. S/p cardiac cath 11/10; no intervention, planned continued medical therapy.  Clinical Impression  Patient evaluated by Physical Therapy with no further acute PT needs identified. Prior to admission, pt lives with his family and is independent. Pt ambulating 500 ft with no assistive device and negotiated 3 steps without physical difficulty. HR 69-69 bpm, SpO2 96% on RA. Pt denies chest pain or dyspnea on exertion. All education has been completed and the patient has no further questions. No follow-up Physical Therapy or equipment needs. PT is signing off. Thank you for this referral.    Follow Up Recommendations No PT follow up    Equipment Recommendations  None recommended by PT    Recommendations for Other Services       Precautions / Restrictions Precautions Precautions: None Restrictions Weight Bearing Restrictions: No      Mobility  Bed Mobility Overal bed mobility: Independent                  Transfers Overall transfer level: Independent Equipment used: None                Ambulation/Gait Ambulation/Gait assistance: Independent Gait Distance (Feet): 500 Feet Assistive device: None Gait Pattern/deviations: WFL(Within Functional Limits)        Stairs Stairs: Yes Stairs assistance: Modified independent (Device/Increase time) Stair Management: One rail Left Number of Stairs: 3 General stair comments: Step over step pattern, modI  Wheelchair Mobility    Modified Rankin  (Stroke Patients Only)       Balance Overall balance assessment: No apparent balance deficits (not formally assessed)                                           Pertinent Vitals/Pain Pain Assessment: No/denies pain    Home Living Family/patient expects to be discharged to:: Private residence Living Arrangements: Spouse/significant other;Other (Comment) (granddaughter, great grandson) Available Help at Discharge: Family Type of Home: House Home Access: Stairs to enter   Technical brewer of Steps: 1 Home Layout: One level        Prior Function Level of Independence: Independent         Comments: Enjoys walking 2-3 times per day     Hand Dominance        Extremity/Trunk Assessment   Upper Extremity Assessment Upper Extremity Assessment: Overall WFL for tasks assessed    Lower Extremity Assessment Lower Extremity Assessment: Overall WFL for tasks assessed    Cervical / Trunk Assessment Cervical / Trunk Assessment: Normal  Communication   Communication: No difficulties  Cognition Arousal/Alertness: Awake/alert Behavior During Therapy: WFL for tasks assessed/performed Overall Cognitive Status: Within Functional Limits for tasks assessed                                        General Comments      Exercises     Assessment/Plan  PT Assessment Patent does not need any further PT services  PT Problem List         PT Treatment Interventions      PT Goals (Current goals can be found in the Care Plan section)  Acute Rehab PT Goals Patient Stated Goal: return home PT Goal Formulation: All assessment and education complete, DC therapy    Frequency     Barriers to discharge        Co-evaluation               AM-PAC PT "6 Clicks" Mobility  Outcome Measure Help needed turning from your back to your side while in a flat bed without using bedrails?: None Help needed moving from lying on your back to  sitting on the side of a flat bed without using bedrails?: None Help needed moving to and from a bed to a chair (including a wheelchair)?: None Help needed standing up from a chair using your arms (e.g., wheelchair or bedside chair)?: None Help needed to walk in hospital room?: None Help needed climbing 3-5 steps with a railing? : None 6 Click Score: 24    End of Session   Activity Tolerance: Patient tolerated treatment well Patient left: in chair;with call bell/phone within reach   PT Visit Diagnosis: Difficulty in walking, not elsewhere classified (R26.2)    Time: 6599-3570 PT Time Calculation (min) (ACUTE ONLY): 12 min   Charges:   PT Evaluation $PT Eval Low Complexity: 1 Low          Wyona Almas, PT, DPT Acute Rehabilitation Services Pager [phone] Office [phone]   Deno Etienne 02/28/2020, 10:04 AM

## 2020-02-28 NOTE — Progress Notes (Addendum)
Progress Note  Patient Name: Cassady Turano Date of Encounter: 02/28/2020  [facility] Cardiologist: Jenne Campus, MD   Subjective   No chest pain this morning. Feeling well.   Inpatient Medications    Scheduled Meds: . aspirin EC  81 mg Oral Daily  . [START ON 02/29/2020] isosorbide mononitrate  120 mg Oral Daily  . memantine  5 mg Oral BID  . metoprolol tartrate  12.5 mg Oral BID  . pantoprazole  40 mg Oral Daily  . ranolazine  1,000 mg Oral BID  . rivaroxaban  2.5 mg Oral BID  . rosuvastatin  20 mg Oral q1800  . sodium chloride flush  3 mL Intravenous Q12H  . sodium chloride flush  3 mL Intravenous Q12H  . sodium chloride flush  3 mL Intravenous Q12H   Continuous Infusions: . sodium chloride    . sodium chloride     PRN Meds: sodium chloride, sodium chloride, acetaminophen, ondansetron (ZOFRAN) IV, sodium chloride flush, sodium chloride flush   Vital Signs    Vitals:   02/27/20 1959 02/27/20 2347 02/28/20 0421 02/28/20 0838  BP: 118/61 121/62 105/64 129/73  Pulse: (!) 57 (!) 56 (!) 53 (!) 56  Resp: 18 18 18 20   Temp: 98 F (36.7 C) 98.3 F (36.8 C) 97.8 F (36.6 C) 98 F (36.7 C)  TempSrc: Oral Oral Oral Oral  SpO2: 96% 96% 97% 97%  Weight:   91.2 kg   Height:        Intake/Output Summary (Last 24 hours) at 02/28/2020 1101 Last data filed at 02/28/2020 0900 Gross per 24 hour  Intake 726 ml  Output 825 ml  Net -99 ml   Last 3 Weights 02/28/2020 02/27/2020 02/26/2020  Weight (lbs) 201 lb 198 lb 12.8 oz 201 lb 14.4 oz  Weight (kg) 91.173 kg 90.175 kg 91.581 kg      Telemetry    SB - Personally Reviewed  ECG    No new tracing  Physical Exam  Pleasant older male GEN: No acute distress.   Neck: No JVD Cardiac: RRR, no murmurs, rubs, or gallops.  Respiratory: Clear to auscultation bilaterally. GI: Soft, nontender, non-distended  MS: No edema; No deformity. Neuro:  Nonfocal  Psych: Normal affect   Labs    High Sensitivity Troponin:    Recent Labs  Lab 02/25/20 1228 02/25/20 1438  TROPONINIHS 6 5      Chemistry Recent Labs  Lab 02/25/20 1228 02/25/20 1228 02/26/20 0046 02/27/20 0343 02/28/20 0328  NA 137   < > 137 138 138  K 4.3   < > 3.6 4.2 4.1  CL 105   < > 103 104 106  CO2 23   < > 24 26 23   GLUCOSE 98   < > 136* 94 97  BUN 12   < > 12 21 18   CREATININE 1.13   < > 1.41* 1.43* 1.25*  CALCIUM 8.9   < > 9.3 9.1 8.7*  PROT 5.9*  --   --   --   --   ALBUMIN 3.3*  --   --   --   --   AST 19  --   --   --   --   ALT 24  --   --   --   --   ALKPHOS 58  --   --   --   --   BILITOT 1.1  --   --   --   --  GFRNONAA >60   < > 53* 52* >60  ANIONGAP 9   < > 10 8 9    < > = values in this interval not displayed.     Hematology Recent Labs  Lab 02/25/20 1228  WBC 7.7  RBC 4.26  HGB 13.7  HCT 40.9  MCV 96.0  MCH 32.2  MCHC 33.5  RDW 13.3  PLT 161    BNP Recent Labs  Lab 02/25/20 1228  BNP 102.1*     DDimer No results for input(s): DDIMER in the last 168 hours.   Radiology    CARDIAC CATHETERIZATION  Result Date: 02/27/2020  RPDA-1 lesion is 70% stenosed.  RPDA-2 lesion is 90% stenosed.  Prox LAD lesion is 50% stenosed.  Dist LAD lesion is 100% stenosed.  Ramus-1 lesion is 70% stenosed.  Ramus-2 lesion is 90% stenosed.  2nd Mrg lesion is 100% stenosed.  LIMA graft was visualized by angiography and is normal in caliber.  The graft exhibits no disease.  The left ventricular systolic function is normal.  LV end diastolic pressure is normal.  The left ventricular ejection fraction is 55-65% by visual estimate.  1. 3 vessel obstructive CAD. Predominantly involving small distal branches. Known occlusion of all SVGs so these were not imaged. The LIMA to the distal LAD is patent. 2. Good LV function 3. Normal LVEDP 12 mm Hg. Plan; recommend continued medical therapy. Compared to prior cath in October 2018 the LIMA graft is better visualized. There is improved flow in the ramus branch. No new  lesions to explain his recent symptoms.   ECHOCARDIOGRAM COMPLETE  Result Date: 02/26/2020    ECHOCARDIOGRAM REPORT   Patient Name:   ROCCO KERKHOFF Date of Exam: 02/26/2020 Medical Rec #:  [id number]   Height:       66.0 in Accession #:    [id number]  Weight:       201.9 lb Date of Birth:  10/12/1945   BSA:          2.008 m Patient Age:    3 years    BP:           106/66 mmHg Patient Gender: M           HR:           57 bpm. Exam Location:  Inpatient Procedure: 2D Echo, Cardiac Doppler and Color Doppler Indications:    CHF  History:        Patient has prior history of Echocardiogram examinations, most                 recent 05/26/2019. CAD and Previous Myocardial Infarction, Prior                 CABG, TIA, Arrythmias:Atrial Fibrillation; Risk                 Factors:Hypertension and Dyslipidemia.  Sonographer:    Dustin Flock Referring Phys: [id number] [place]  1. Left ventricular ejection fraction, by estimation, is 55 to 60%. The left ventricle has normal function. The left ventricle has no regional wall motion abnormalities. Left ventricular diastolic parameters were normal.  2. Right ventricular systolic function is normal. The right ventricular size is normal. There is normal pulmonary artery systolic pressure.  3. Left atrial size was mildly dilated.  4. The mitral valve is normal in structure. No evidence of mitral valve regurgitation. No evidence of mitral stenosis.  5. The aortic valve is normal in structure. Aortic  valve regurgitation is not visualized. No aortic stenosis is present.  6. The inferior vena cava is normal in size with greater than 50% respiratory variability, suggesting right atrial pressure of 3 mmHg. FINDINGS  Left Ventricle: Left ventricular ejection fraction, by estimation, is 55 to 60%. The left ventricle has normal function. The left ventricle has no regional wall motion abnormalities. The left ventricular internal cavity size was normal in size. There is  no left  ventricular hypertrophy. Left ventricular diastolic parameters were normal. Right Ventricle: The right ventricular size is normal. No increase in right ventricular wall thickness. Right ventricular systolic function is normal. There is normal pulmonary artery systolic pressure. The tricuspid regurgitant velocity is 2.24 m/s, and  with an assumed right atrial pressure of 3 mmHg, the estimated right ventricular systolic pressure is 16.1 mmHg. Left Atrium: Left atrial size was mildly dilated. Right Atrium: Right atrial size was normal in size. Pericardium: There is no evidence of pericardial effusion. Mitral Valve: The mitral valve is normal in structure. No evidence of mitral valve regurgitation. No evidence of mitral valve stenosis. Tricuspid Valve: The tricuspid valve is normal in structure. Tricuspid valve regurgitation is not demonstrated. No evidence of tricuspid stenosis. Aortic Valve: The aortic valve is normal in structure. Aortic valve regurgitation is not visualized. No aortic stenosis is present. Pulmonic Valve: The pulmonic valve was normal in structure. Pulmonic valve regurgitation is not visualized. No evidence of pulmonic stenosis. Aorta: The aortic root is normal in size and structure. Venous: The inferior vena cava is normal in size with greater than 50% respiratory variability, suggesting right atrial pressure of 3 mmHg. IAS/Shunts: No atrial level shunt detected by color flow Doppler.  TRICUSPID VALVE TV Peak grad:   20.1 mmHg TV Vmax:        2.24 m/s TR Peak grad:   20.1 mmHg TR Vmax:        224.00 cm/s Jenkins Rouge MD Electronically signed by Jenkins Rouge MD Signature Date/Time: 02/26/2020/3:15:11 PM    Final    VAS [place] LOWER EXTREMITY VENOUS (DVT)  Result Date: 02/26/2020  Lower Venous DVT Study Indications: Edema.  Comparison Study: no prior Performing Technologist: Abram Sander RVS  Examination Guidelines: A complete evaluation includes B-mode imaging, spectral Doppler, color Doppler, and  power Doppler as needed of all accessible portions of each vessel. Bilateral testing is considered an integral part of a complete examination. Limited examinations for reoccurring indications may be performed as noted. The reflux portion of the exam is performed with the patient in reverse Trendelenburg.  +---------+---------------+---------+-----------+----------+--------------+ RIGHT    CompressibilityPhasicitySpontaneityPropertiesThrombus Aging +---------+---------------+---------+-----------+----------+--------------+ CFV      Full           Yes      Yes                                 +---------+---------------+---------+-----------+----------+--------------+ SFJ      Full                                                        +---------+---------------+---------+-----------+----------+--------------+ FV Prox  Full                                                        +---------+---------------+---------+-----------+----------+--------------+  FV Mid   Full                                                        +---------+---------------+---------+-----------+----------+--------------+ FV DistalFull                                                        +---------+---------------+---------+-----------+----------+--------------+ PFV      Full                                                        +---------+---------------+---------+-----------+----------+--------------+ POP      Full           Yes      Yes                                 +---------+---------------+---------+-----------+----------+--------------+ PTV      Full                                                        +---------+---------------+---------+-----------+----------+--------------+ PERO     Full                                                        +---------+---------------+---------+-----------+----------+--------------+    +---------+---------------+---------+-----------+----------+--------------+ LEFT     CompressibilityPhasicitySpontaneityPropertiesThrombus Aging +---------+---------------+---------+-----------+----------+--------------+ CFV      Full           Yes      Yes                                 +---------+---------------+---------+-----------+----------+--------------+ SFJ      Full                                                        +---------+---------------+---------+-----------+----------+--------------+ FV Prox  Full                                                        +---------+---------------+---------+-----------+----------+--------------+ FV Mid   Full                                                        +---------+---------------+---------+-----------+----------+--------------+   FV DistalFull                                                        +---------+---------------+---------+-----------+----------+--------------+ PFV      Full                                                        +---------+---------------+---------+-----------+----------+--------------+ POP      Full           Yes      Yes                                 +---------+---------------+---------+-----------+----------+--------------+ PTV      Full                                                        +---------+---------------+---------+-----------+----------+--------------+ PERO     Full                                                        +---------+---------------+---------+-----------+----------+--------------+     Summary: BILATERAL: - No evidence of deep vein thrombosis seen in the lower extremities, bilaterally. - No evidence of superficial venous thrombosis in the lower extremities, bilaterally. -No evidence of popliteal cyst, bilaterally.   *See table(s) above for measurements and observations. Electronically signed by Monica Martinez MD on 02/26/2020  at 3:31:06 PM.    Final     Cardiac Studies   CATH 02/27/2020   RPDA-1 lesion is 70% stenosed.  RPDA-2 lesion is 90% stenosed.  Prox LAD lesion is 50% stenosed.  Dist LAD lesion is 100% stenosed.  Ramus-1 lesion is 70% stenosed.  Ramus-2 lesion is 90% stenosed.  2nd Mrg lesion is 100% stenosed.  LIMA graft was visualized by angiography and is normal in caliber.  The graft exhibits no disease.  The left ventricular systolic function is normal.  LV end diastolic pressure is normal.  The left ventricular ejection fraction is 55-65% by visual estimate.  1. 3 vessel obstructive CAD. Predominantly involving small distal branches. Known occlusion of all SVGs so these were not imaged. The LIMA to the distal LAD is patent.  2. Good LV function 3. Normal LVEDP 12 mm Hg.  Plan; recommend continued medical therapy. Compared to prior cath in October 2018 the LIMA graft is better visualized. There is improved flow in the ramus branch. No new lesions to explain his recent symptoms.    Echo: 02/26/20  IMPRESSIONS    1. Left ventricular ejection fraction, by estimation, is 55 to 60%. The  left ventricle has normal function. The left ventricle has no regional  wall motion abnormalities. Left ventricular diastolic parameters were  normal.  2. Right ventricular systolic function is normal. The right ventricular  size is normal.  There is normal pulmonary artery systolic pressure.  3. Left atrial size was mildly dilated.  4. The mitral valve is normal in structure. No evidence of mitral valve  regurgitation. No evidence of mitral stenosis.  5. The aortic valve is normal in structure. Aortic valve regurgitation is  not visualized. No aortic stenosis is present.  6. The inferior vena cava is normal in size with greater than 50%  respiratory variability, suggesting right atrial pressure of 3 mmHg.   Patient Profile     74 y.o. male with a hx of CAD s/p CABG '18 (all grafts  occluded except LIMA-LAD on cath 01/2017), HTN, HLD, and TIAwho was for the evaluation ofchest painat the request of Dr. Aileen Fass.  Assessment & Plan    1. CAD s/p CABG:underwent CABG 2018, follow up cath 01/2017 demonstrated occluded vein grafts with patent LIMA.  Catheterization data was reviewed by Dr. Claiborne Billings and Dr. Martinique in the Cath Lab.  Recommend medical therapy.  Fortunately, the catheterization did not reveal progressive disease in the proximal vessels compared to his prior cath.  LIMA graft flow was improved compared to previous evaluation. -- With his profound weakness his BB was reduced to 12.5mg  BID, will further reduce Imdur to 120mg  daily   2. Chronic diastolic ME:QAST this admission with normal EF. Had stopped his lasix about one week prior to admission. Given IV lasix 40mg  x1 on admission.  I&O since admission -2352.   -- would plan to restart lasix at discharge. Did report LE edema even with lasix, suspect this could be related to Norvasc. Would not restart at discharge and follow up symptoms at follow up office visit.   3. HTN: stable blood pressures on current therapy  4. HLD: on statin therapy with LDL 46.  Since patient is on ranolazine, due to potential dose interaction with atorvastatin, Dr. Claiborne Billings switched statin was to rosuvastatin due to less potential interaction.  5. OSA:on Cpap  [facility] will sign off.   Medication Recommendations:  Noted above Other recommendations (labs, testing, etc):  none Follow up as an outpatient:  Will arrange outpatient follow with Dr. Fraser Din  For questions or updates, please contact [facility] Please consult [email or web address] for contact info under   Signed, Reino Bellis, NP  02/28/2020, 11:01 AM     Patient seen and examined. Agree with assessment and plan.  Patient feels well today and appears to have more energy.  I again reviewed his catheterization findings with him in detail.  With the catheterization  data not showing any new progressive proximal CAD compared to prior evaluation I have made 3 changes to his preadmission medical regimen which should improve his prior generalized weakness and fatigability.  His beta-blocker dose has been reduced to 12.5 mg twice daily particularly with his pulse in the low 50s.  Isosorbide has been reduced from 180 mg down to 120 mg.  The patient had been on maximum dose atorvastatin with concomitant ranolazine administration, due to potential interaction and need for lower atorvastatin dose with Ranexa I have changed atorvastatin to rosuvastatin and initially started him on 20 mg but this dose can be increased to 40 mg as an outpatient if LDL increases above 70.  Alternatively Zetia can be added to his lower dose rosuvastatin regimen.  Left radial catheterization site is stable.  From a cardiology perspective, the patient can be discharged today with plans for follow-up in [place] with Dr. Agustin Cree.   Troy Sine, MD, [facility] 02/28/2020 12:25 PM

## 2020-02-28 NOTE — Discharge Instructions (Signed)
You were treated for increased fatigue and weakness  You underwent a left sided heart catheterization which showed no new blockages  Your fatigue is likely from your lower heart rate so your heart medications were changed.  We decreased these medications which were lowering your heart rate and likely making you tired  You will now take  Metoprolol 12.5mg  twice daily (that's half a pill twice a day) Imudr 120 mg daily ( two pills daily)  Also your amlodipine was discontinued because of the swelling you tend to have. You can have that monitored by your Heart doctor in [place] can keep taking your lasix  We also changed your cholesterol medication to rosuvastatin( and discontinued the atorvastatin you were on before)

## 2020-02-28 NOTE — Progress Notes (Signed)
D/C instructions given and reviewed. No questions asked but encouraged to call with any concerns. IV's removed, tolerated well.

## 2020-02-28 NOTE — Plan of Care (Signed)
  Problem: Education: Goal: Ability to demonstrate management of disease process will improve Outcome: Adequate for Discharge Goal: Ability to verbalize understanding of medication therapies will improve Outcome: Adequate for Discharge   Problem: Activity: Goal: Capacity to carry out activities will improve Outcome: Adequate for Discharge   Problem: Cardiac: Goal: Ability to achieve and maintain adequate cardiopulmonary perfusion will improve Outcome: Adequate for Discharge   Problem: Education: Goal: Knowledge of General Education information will improve Description: Including pain rating scale, medication(s)/side effects and non-pharmacologic comfort measures Outcome: Adequate for Discharge   Problem: Health Behavior/Discharge Planning: Goal: Ability to manage health-related needs will improve Outcome: Adequate for Discharge   Problem: Clinical Measurements: Goal: Ability to maintain clinical measurements within normal limits will improve Outcome: Adequate for Discharge Goal: Will remain free from infection Outcome: Adequate for Discharge Goal: Diagnostic test results will improve Outcome: Adequate for Discharge Goal: Respiratory complications will improve Outcome: Adequate for Discharge Goal: Cardiovascular complication will be avoided Outcome: Adequate for Discharge   Problem: Activity: Goal: Risk for activity intolerance will decrease Outcome: Adequate for Discharge   Problem: Nutrition: Goal: Adequate nutrition will be maintained Outcome: Adequate for Discharge   Problem: Coping: Goal: Level of anxiety will decrease Outcome: Adequate for Discharge   Problem: Elimination: Goal: Will not experience complications related to bowel motility Outcome: Adequate for Discharge Goal: Will not experience complications related to urinary retention Outcome: Adequate for Discharge   Problem: Pain Managment: Goal: General experience of comfort will improve Outcome:  Adequate for Discharge   Problem: Safety: Goal: Ability to remain free from injury will improve Outcome: Adequate for Discharge   Problem: Skin Integrity: Goal: Risk for impaired skin integrity will decrease Outcome: Adequate for Discharge   Problem: Education: Goal: Understanding of CV disease, CV risk reduction, and recovery process will improve Outcome: Adequate for Discharge Goal: Individualized Educational Video(s) Outcome: Adequate for Discharge   Problem: Activity: Goal: Ability to return to baseline activity level will improve Outcome: Adequate for Discharge   Problem: Cardiovascular: Goal: Ability to achieve and maintain adequate cardiovascular perfusion will improve Outcome: Adequate for Discharge Goal: Vascular access site(s) Level 0-1 will be maintained Outcome: Adequate for Discharge   Problem: Health Behavior/Discharge Planning: Goal: Ability to safely manage health-related needs after discharge will improve Outcome: Adequate for Discharge

## 2020-02-28 NOTE — TOC Transition Note (Signed)
Transition of Care [facility]) - CM/SW Discharge Note   Patient Details  Name: Ernest Booker MRN: [id number] Date of Birth: 05/02/1945  [facility] CM/SW Contact:  Zenon Mayo, RN Phone Number: 02/28/2020, 1:04 PM   Clinical Narrative:    Patient is for dc today, NCM spoke with patient asked if he would like [facility] for CHF management, he states no.  He states his wife assist him with everything and he has a daughter who is a NP.  He has no other needs.    Final next level of care: Home/Self Care Barriers to Discharge: No Barriers Identified   Patient Goals and CMS Choice        Discharge Placement                       Discharge Plan and Services                  DME Agency: NA       HH Arranged: NA          Social Determinants of Health (SDOH) Interventions     Readmission Risk Interventions No flowsheet data found.

## 2020-02-28 NOTE — Discharge Summary (Signed)
Ernest Booker HER:[id number] DOB: 1945-08-03 DOA: 02/25/2020  PCP: Ernest Kiel, MD  Admit date: 02/25/2020 Discharge date: 02/28/2020  Admitted From: Home Disposition: Home  Recommendations for Outpatient Follow-up:  1. Follow up with PCP in 1-2 weeks 2. Discontinued amlodipine, discontinued atorvastatin 3. Start rosuvastatin 4. Decreased Imdur to 120 mg, decreased Lopressor to 12.5 mg 5. Please follow up on the following pending results:  Home Health: None Equipment/Devices: None Discharge Condition: Stable CODE STATUS: Full  Hospital Course:   Ernest Booker is a 74 year old male medical history significant for ischemic cardiomyopathy, atrial fibrillation on Xarelto, CAD with CABG, OSA adherent with CPAP, stable angina, HTN, CKD stage II who presented on 11/7 with symptoms of fatigue and weakness for the past week.  He was found to have heart rate of 56, Covid test negative, troponin unremarkable and no ischemic changes on EKG, BNP 102 and unremarkable chest x-ray initially on admission he was admitted with working diagnosis of generalized weakness of unclear etiology with concern for possible CHF decompensation related to nonadherence to his home Lasix therapy.  Generalized weakness and fatigue.  TSH is within normal limits, CBC CMP largely unremarkable.  Chest x-ray did not show signs of fluid overload.  On further history patient reported associated with shortness of breath with exertion which prompted consultation by cardiology for ischemic work-up -Patient underwent left heart cath with no new obstructive pathology and patent LIMA to distal LAD graft -Suspected his symptoms are related to his beta-blockade -Patient reported significant improvement in fatigue and weakness after reduction of Lopressor to 12.5 mg twice daily and Imdur 120 mg daily (down from 100 mg) which she will continue on discharge as recommended by cardiology  Chronic diastolic CHF.  TTE this admission showed  normal EF.  Prior to examination patient has been off his Lasix for at least 1 week.  Require IV Lasix 40 mg x 1 while in the ED.-Tolerated resuming oral Lasix during hospital stay for which she will continue on discharge and have close follow-up with his outpatient cardiologist  Hyperlipidemia -Due to potential interaction of renal renal eczema with atorvastatin there was discontinued in favor of rosuvastatin due to less potential interaction started at 20 mg -Per cardiology, increase caloric intake patient if LDL increases above 7) that he is also to be added to low-dose exercise regimen  OSA, stable -Continue CPAP  Peripheral edema euvolemic otherwise.  Venous duplex was negative for clot.-Discontinued amlodipine  Chronic atrial fibrillation-continue reduced dose metoprolol and Xarelto  CAD with history of CABG-continue aspirin, Ranexa -Continue reduced dose of Imdur metoprolol -Left heart cath showed no new ischemic disease   Consultations:  Cardiology  Procedures/Studies:  Left heart cath, 11/9   RPDA-1 lesion is 70% stenosed.  RPDA-2 lesion is 90% stenosed.  Prox LAD lesion is 50% stenosed.  Dist LAD lesion is 100% stenosed.  Ramus-1 lesion is 70% stenosed.  Ramus-2 lesion is 90% stenosed.  2nd Mrg lesion is 100% stenosed.  LIMA graft was visualized by angiography and is normal in caliber.  The graft exhibits no disease.  The left ventricular systolic function is normal.  LV end diastolic pressure is normal.  The left ventricular ejection fraction is 55-65% by visual estimate.   1. 3 vessel obstructive CAD. Predominantly involving small distal branches. Known occlusion of all SVGs so these were not imaged. The LIMA to the distal LAD is patent.  2. Good LV function 3. Normal LVEDP 12 mm Hg.  Plan; recommend continued medical therapy. Compared to  prior cath in October 2018 the LIMA graft is better visualized. There is improved flow in the ramus branch. No  new lesions to explain his recent symptoms.   TTE, 11/8 1. Left ventricular ejection fraction, by estimation, is 55 to 60%. The  left ventricle has normal function. The left ventricle has no regional  wall motion abnormalities. Left ventricular diastolic parameters were  normal.  2. Right ventricular systolic function is normal. The right ventricular  size is normal. There is normal pulmonary artery systolic pressure.  3. Left atrial size was mildly dilated.  4. The mitral valve is normal in structure. No evidence of mitral valve  regurgitation. No evidence of mitral stenosis.  5. The aortic valve is normal in structure. Aortic valve regurgitation is  not visualized. No aortic stenosis is present.  6. The inferior vena cava is normal in size with greater than 50%  respiratory variability, suggesting right atrial pressure of 3 mmHg.    Subjective: Feels well, no chest pain, no shortness of breath discharge Exam: Vitals:   02/28/20 0838 02/28/20 1141  BP: 129/73 132/72  Pulse: (!) 56 (!) 51  Resp: 20 20  Temp: 98 F (36.7 C) (!) 97.4 F (36.3 C)  SpO2: 97% 97%   Vitals:   02/27/20 2347 02/28/20 0421 02/28/20 0838 02/28/20 1141  BP: 121/62 105/64 129/73 132/72  Pulse: (!) 56 (!) 53 (!) 56 (!) 51  Resp: 18 18 20 20   Temp: 98.3 F (36.8 C) 97.8 F (36.6 C) 98 F (36.7 C) (!) 97.4 F (36.3 C)  TempSrc: Oral Oral Oral Oral  SpO2: 96% 97% 97% 97%  Weight:  91.2 kg    Height:        General: Lying in bed, no apparent distress Eyes: EOMI, anicteric ENT: Oral Mucosa clear and moist Cardiovascular: regular rate and rhythm, no murmurs, rubs or gallops, no edema, Respiratory: Normal respiratory effort on room air, lungs clear to auscultation bilaterally Abdomen: soft, non-distended, non-tender, normal bowel sounds Skin: No Rash Neurologic: Hard of hearing otherwise grossly no focal neuro deficit.Mental status AAOx3, speech normal, Psychiatric:Appropriate affect, and  mood  Discharge Diagnoses:  Active Problems:   Hypertension   CAD (coronary artery disease)   Chronic kidney disease (CKD), stage III (moderate) (HCC)   Weakness   Hyperlipidemia LDL goal <70   Chronic diastolic CHF (congestive heart failure) (HCC)   Shortness of breath    Discharge Instructions  Discharge Instructions    Diet - low sodium heart healthy   Complete by: As directed    Increase activity slowly   Complete by: As directed      Allergies as of 02/28/2020      Reactions   Benadryl [diphenhydramine Hcl (sleep)] Other (See Comments)   Reaction not recalled   Levofloxacin Other (See Comments)   Tendon rupture       Medication List    STOP taking these medications   amLODipine 5 MG tablet Commonly known as: NORVASC   atorvastatin 80 MG tablet Commonly known as: LIPITOR     TAKE these medications   aspirin 81 MG EC tablet Take 1 tablet (81 mg total) by mouth daily.   furosemide 40 MG tablet Commonly known as: LASIX TAKE 1 TABLET DAILY What changed:   how much to take  how to take this  when to take this  additional instructions  Another medication with the same name was removed. Continue taking this medication, and follow the directions you see here.  isosorbide mononitrate 60 MG 24 hr tablet Commonly known as: IMDUR Take 2 tablets (120 mg total) by mouth daily. What changed: how much to take   memantine 5 MG tablet Commonly known as: NAMENDA Take 5 mg by mouth 2 (two) times daily.   metoprolol tartrate 25 MG tablet Commonly known as: LOPRESSOR Take 0.5 tablets (12.5 mg total) by mouth 2 (two) times daily. What changed: how much to take   nitroGLYCERIN 0.1 mg/hr patch Commonly known as: NITRODUR - Dosed in mg/24 hr PLACE 1 PATCH ON THE SKIN DAILY What changed: See the new instructions.   nitroGLYCERIN 0.4 MG SL tablet Commonly known as: NITROSTAT Place 0.4 mg under the tongue every 5 (five) minutes as needed for chest pain. What  changed: Another medication with the same name was changed. Make sure you understand how and when to take each.   pantoprazole 40 MG tablet Commonly known as: PROTONIX Take 40 mg by mouth in the morning and at bedtime.   potassium chloride 10 MEQ tablet Commonly known as: KLOR-CON Take 1 tablet (10 mEq total) by mouth daily.   ranolazine 1000 MG SR tablet Commonly known as: RANEXA TAKE 1  BY MOUTH TWICE DAILY What changed: See the new instructions.   rosuvastatin 20 MG tablet Commonly known as: CRESTOR Take 1 tablet (20 mg total) by mouth daily at 6 PM.   Xarelto 2.5 MG Tabs tablet Generic drug: rivaroxaban TAKE 1 TABLET BY MOUTH TWICE A DAY What changed: how much to take       Allergies  Allergen Reactions  . Benadryl [Diphenhydramine Hcl (Sleep)] Other (See Comments)    Reaction not recalled  . Levofloxacin Other (See Comments)    Tendon rupture         The results of significant diagnostics from this hospitalization (including imaging, microbiology, ancillary and laboratory) are listed below for reference.     Microbiology: Recent Results (from the past 240 hour(s))  Respiratory Panel by RT PCR (Flu A&B, Covid) - Nasopharyngeal Swab     Status: None   Collection Time: 02/25/20 12:30 PM   Specimen: Nasopharyngeal Swab  Result Value Ref Range Status   SARS Coronavirus 2 by RT PCR NEGATIVE NEGATIVE Final    Comment: (NOTE) SARS-CoV-2 target nucleic acids are NOT DETECTED.  The SARS-CoV-2 RNA is generally detectable in upper respiratoy specimens during the acute phase of infection. The lowest concentration of SARS-CoV-2 viral copies this assay can detect is 131 copies/mL. A negative result does not preclude SARS-Cov-2 infection and should not be used as the sole basis for treatment or other patient management decisions. A negative result may occur with  improper specimen collection/handling, submission of specimen other than nasopharyngeal swab, presence of  viral mutation(s) within the areas targeted by this assay, and inadequate number of viral copies (<131 copies/mL). A negative result must be combined with clinical observations, patient history, and epidemiological information. The expected result is Negative.  Fact Sheet for Patients:  PinkCheek.be  Fact Sheet for Healthcare Providers:  GravelBags.it  This test is no t yet approved or cleared by the [place] FDA and  has been authorized for detection and/or diagnosis of SARS-CoV-2 by FDA under an Emergency Use Authorization (EUA). This EUA will remain  in effect (meaning this test can be used) for the duration of the COVID-19 declaration under Section 564(b)(1) of the Act, 21 U.S.C. section 360bbb-3(b)(1), unless the authorization is terminated or revoked sooner.     Influenza A by PCR  NEGATIVE NEGATIVE Final   Influenza B by PCR NEGATIVE NEGATIVE Final    Comment: (NOTE) The Xpert Xpress SARS-CoV-2/FLU/RSV assay is intended as an aid in  the diagnosis of influenza from Nasopharyngeal swab specimens and  should not be used as a sole basis for treatment. Nasal washings and  aspirates are unacceptable for Xpert Xpress SARS-CoV-2/FLU/RSV  testing.  Fact Sheet for Patients: PinkCheek.be  Fact Sheet for Healthcare Providers: GravelBags.it  This test is not yet approved or cleared by the [place] FDA and  has been authorized for detection and/or diagnosis of SARS-CoV-2 by  FDA under an Emergency Use Authorization (EUA). This EUA will remain  in effect (meaning this test can be used) for the duration of the  Covid-19 declaration under Section 564(b)(1) of the Act, 21  U.S.C. section 360bbb-3(b)(1), unless the authorization is  terminated or revoked. Performed at [facility], [place] [address], [place], [place] [address]      Labs: BNP (last 3  results) Recent Labs    02/25/20 1228  BNP 604.5*   Basic Metabolic Panel: Recent Labs  Lab 02/25/20 1228 02/26/20 0046 02/27/20 0343 02/28/20 0328  NA 137 137 138 138  K 4.3 3.6 4.2 4.1  CL 105 103 104 106  CO2 23 24 26 23   GLUCOSE 98 136* 94 97  BUN 12 12 21 18   CREATININE 1.13 1.41* 1.43* 1.25*  CALCIUM 8.9 9.3 9.1 8.7*  MG  --  2.1  --   --    Liver Function Tests: Recent Labs  Lab 02/25/20 1228  AST 19  ALT 24  ALKPHOS 58  BILITOT 1.1  PROT 5.9*  ALBUMIN 3.3*   No results for input(s): LIPASE, AMYLASE in the last 168 hours. No results for input(s): AMMONIA in the last 168 hours. CBC: Recent Labs  Lab 02/25/20 1228  WBC 7.7  HGB 13.7  HCT 40.9  MCV 96.0  PLT 161   Cardiac Enzymes: No results for input(s): CKTOTAL, CKMB, CKMBINDEX, TROPONINI in the last 168 hours. BNP: Invalid input(s): POCBNP CBG: No results for input(s): GLUCAP in the last 168 hours. D-Dimer No results for input(s): DDIMER in the last 72 hours. Hgb A1c No results for input(s): HGBA1C in the last 72 hours. Lipid Profile Recent Labs    02/27/20 0343  CHOL 101  HDL 36*  LDLCALC 46  TRIG 97  CHOLHDL 2.8   Thyroid function studies No results for input(s): TSH, T4TOTAL, T3FREE, THYROIDAB in the last 72 hours.  Invalid input(s): FREET3 Anemia work up No results for input(s): VITAMINB12, FOLATE, FERRITIN, TIBC, IRON, RETICCTPCT in the last 72 hours. Urinalysis    Component Value Date/Time   COLORURINE YELLOW 09/25/2016 1820   APPEARANCEUR CLEAR 09/25/2016 1820   LABSPEC 1.014 09/25/2016 1820   PHURINE 5.0 09/25/2016 1820   GLUCOSEU NEGATIVE 09/25/2016 1820   HGBUR NEGATIVE 09/25/2016 1820   BILIRUBINUR NEGATIVE 09/25/2016 1820   KETONESUR NEGATIVE 09/25/2016 1820   PROTEINUR NEGATIVE 09/25/2016 1820   NITRITE NEGATIVE 09/25/2016 1820   LEUKOCYTESUR NEGATIVE 09/25/2016 1820   Sepsis Labs Invalid input(s): PROCALCITONIN,  WBC,  LACTICIDVEN Microbiology Recent  Results (from the past 240 hour(s))  Respiratory Panel by RT PCR (Flu A&B, Covid) - Nasopharyngeal Swab     Status: None   Collection Time: 02/25/20 12:30 PM   Specimen: Nasopharyngeal Swab  Result Value Ref Range Status   SARS Coronavirus 2 by RT PCR NEGATIVE NEGATIVE Final    Comment: (NOTE) SARS-CoV-2 target  nucleic acids are NOT DETECTED.  The SARS-CoV-2 RNA is generally detectable in upper respiratoy specimens during the acute phase of infection. The lowest concentration of SARS-CoV-2 viral copies this assay can detect is 131 copies/mL. A negative result does not preclude SARS-Cov-2 infection and should not be used as the sole basis for treatment or other patient management decisions. A negative result may occur with  improper specimen collection/handling, submission of specimen other than nasopharyngeal swab, presence of viral mutation(s) within the areas targeted by this assay, and inadequate number of viral copies (<131 copies/mL). A negative result must be combined with clinical observations, patient history, and epidemiological information. The expected result is Negative.  Fact Sheet for Patients:  PinkCheek.be  Fact Sheet for Healthcare Providers:  GravelBags.it  This test is no t yet approved or cleared by the [place] FDA and  has been authorized for detection and/or diagnosis of SARS-CoV-2 by FDA under an Emergency Use Authorization (EUA). This EUA will remain  in effect (meaning this test can be used) for the duration of the COVID-19 declaration under Section 564(b)(1) of the Act, 21 U.S.C. section 360bbb-3(b)(1), unless the authorization is terminated or revoked sooner.     Influenza A by PCR NEGATIVE NEGATIVE Final   Influenza B by PCR NEGATIVE NEGATIVE Final    Comment: (NOTE) The Xpert Xpress SARS-CoV-2/FLU/RSV assay is intended as an aid in  the diagnosis of influenza from Nasopharyngeal swab  specimens and  should not be used as a sole basis for treatment. Nasal washings and  aspirates are unacceptable for Xpert Xpress SARS-CoV-2/FLU/RSV  testing.  Fact Sheet for Patients: PinkCheek.be  Fact Sheet for Healthcare Providers: GravelBags.it  This test is not yet approved or cleared by the [place] FDA and  has been authorized for detection and/or diagnosis of SARS-CoV-2 by  FDA under an Emergency Use Authorization (EUA). This EUA will remain  in effect (meaning this test can be used) for the duration of the  Covid-19 declaration under Section 564(b)(1) of the Act, 21  U.S.C. section 360bbb-3(b)(1), unless the authorization is  terminated or revoked. Performed at [facility], [place] [address] [place],  [address]      Time coordinating discharge: Over 30 minutes  SIGNED:   Desiree Hane, MD  [facility] 02/28/2020, 5:31 PM Pager   If 7PM-7AM, please contact night-coverage [email or web address] Password [email or web address]

## 2020-03-07 DIAGNOSIS — I25709 Atherosclerosis of coronary artery bypass graft(s), unspecified, with unspecified angina pectoris: Secondary | ICD-10-CM | POA: Diagnosis not present

## 2020-03-07 DIAGNOSIS — R531 Weakness: Secondary | ICD-10-CM | POA: Diagnosis not present

## 2020-03-07 DIAGNOSIS — I951 Orthostatic hypotension: Secondary | ICD-10-CM | POA: Diagnosis not present

## 2020-03-07 DIAGNOSIS — Z6832 Body mass index (BMI) 32.0-32.9, adult: Secondary | ICD-10-CM | POA: Diagnosis not present

## 2020-03-11 ENCOUNTER — Ambulatory Visit (INDEPENDENT_AMBULATORY_CARE_PROVIDER_SITE_OTHER): Payer: Medicare Other

## 2020-03-11 DIAGNOSIS — Z8673 Personal history of transient ischemic attack (TIA), and cerebral infarction without residual deficits: Secondary | ICD-10-CM

## 2020-03-12 LAB — CUP PACEART REMOTE DEVICE CHECK
Date Time Interrogation Session: 20211122100101
Implantable Pulse Generator Implant Date: 20210610

## 2020-03-18 NOTE — Progress Notes (Signed)
Carelink Summary Report / Loop Recorder 

## 2020-03-19 DIAGNOSIS — Z23 Encounter for immunization: Secondary | ICD-10-CM | POA: Diagnosis not present

## 2020-03-20 ENCOUNTER — Telehealth: Payer: Self-pay | Admitting: Cardiology

## 2020-03-20 NOTE — Telephone Encounter (Signed)
Thank you, good advice is

## 2020-03-20 NOTE — Telephone Encounter (Signed)
Spoke with the patient and his wife. The patient has been having swelling in his abdomen and legs. He has gained 2 lbs since yesterday and continues to take Lasix 40 mg daily. No chest pain or increased SOB.  He also reports some orthostatic hypotension (dropping 156/77 to 118/57). Does not feel like he is going to pass out.  Taking all medications as prescribed.  Advised patient to continue taking lasix daily and if he gains more than 3 lbs in a day he can take an extra. Also advised patient to change positions slowly.  Patient missed his 3 month FU appointment due to being hospitalized.  I have scheduled him to see Dr. Agustin Cree 12/6. Advised that he will be contacted if there are any further recommendations prior to appointment and to contact us if anything worsens.

## 2020-03-20 NOTE — Telephone Encounter (Signed)
Pt c/o swelling: STAT is pt has developed SOB within 24 hours  1) How much weight have you gained and in what time span? Couple of pounds since yesterday  2) If swelling, where is the swelling located? abdomen  3) Are you currently taking a fluid pill? yes  4) Are you currently SOB? Yes but nothing new  5) Do you have a log of your daily weights (if so, list)? no  6) Have you gained 3 pounds in a day or 5 pounds in a week? A little over 2 pounds since yesterday. Patient weighed a little over 205 but weighed 203 yesterday.  7) Have you traveled recently? No   Pt c/o BP issue: STAT if pt c/o blurred vision, one-sided weakness or slurred speech  1. What are your last 5 BP readings? On 03/07/20 patient was sitting down (had not taken meds yet) and BP was 156/77 when he stood up it was 118/57  2. Are you having any other symptoms (ex. Dizziness, headache, blurred vision, passed out)? Last few days he has been having some sharp pains go through his head but no headaches.   3. What is your BP issue? Patients BP is dropping when he stands up.    Patient went to [facility] ED on 11/07 and followed up with his PCP on 11/18 who advised him to contact his cardiologist. While at the appointment his BP when standing up dropped to 19-37 systolic.

## 2020-03-22 DIAGNOSIS — Z951 Presence of aortocoronary bypass graft: Secondary | ICD-10-CM | POA: Insufficient documentation

## 2020-03-22 DIAGNOSIS — E785 Hyperlipidemia, unspecified: Secondary | ICD-10-CM | POA: Insufficient documentation

## 2020-03-25 ENCOUNTER — Encounter: Payer: Self-pay | Admitting: Cardiology

## 2020-03-25 ENCOUNTER — Other Ambulatory Visit: Payer: Self-pay

## 2020-03-25 ENCOUNTER — Ambulatory Visit (INDEPENDENT_AMBULATORY_CARE_PROVIDER_SITE_OTHER): Payer: Medicare Other | Admitting: Cardiology

## 2020-03-25 VITALS — BP 106/66 | HR 81 | Ht 66.0 in | Wt 209.0 lb

## 2020-03-25 DIAGNOSIS — I25118 Atherosclerotic heart disease of native coronary artery with other forms of angina pectoris: Secondary | ICD-10-CM | POA: Diagnosis not present

## 2020-03-25 DIAGNOSIS — I255 Ischemic cardiomyopathy: Secondary | ICD-10-CM | POA: Diagnosis not present

## 2020-03-25 DIAGNOSIS — I5033 Acute on chronic diastolic (congestive) heart failure: Secondary | ICD-10-CM

## 2020-03-25 DIAGNOSIS — Z951 Presence of aortocoronary bypass graft: Secondary | ICD-10-CM

## 2020-03-25 NOTE — Progress Notes (Signed)
Cardiology Office Note:    Date:  03/25/2020   ID:  Ernest Booker, DOB [birth year], MRN [id number]  PCP:  [name], MD  Cardiologist:  Jenne Campus, MD    Referring MD: [name], MD   Chief Complaint  Patient presents with  . BP drop while transitioning to standing  . Abdominal swelling    History of Present Illness:    Ernest Booker is a 74 y.o. male very complex past medical history which include coronary artery disease, status post coronary bypass graft years ago then after that he was find to have completely occluded all grafts except LIMA since that time he is being managed medically.  Recently he ended being in the hospital because of weakness fatigue and tiredness he was found to have low blood pressure.  He did have a cardiac catheterization which showed no new findings, 3 grafts occluded LIMA still patent.  Also echocardiogram has been done which showed preserved left ventricle ejection fraction.  Conclusion was that his fatigue and tiredness was related to hypertension related to a lot of medications.  His amlodipine has been discontinued, his Imdur has been lowered.  His beta-blocker has been lowered.  He comes today 2 months for follow-up.  He complained of weakness fatigue and tiredness.  Also described to have some swelling of lower extremities.  He was put on Lasix 40 is taking this daily.  It seems to be helping with the swelling.  Past Medical History:  Diagnosis Date  . Acute on chronic diastolic CHF (congestive heart failure) ([place]) 02/25/2020  . Angina pectoris ([place])   . Anginal pain ([place])   . Arthritis   . Atrial fibrillation ([place]) 09/15/2017  . Bilateral numbness and tingling of arms and legs 10/29/2015  . CAD (coronary artery disease) 09/28/2016  . Cardiogenic shock ([place])   . Cervical disc disease 10/29/2015  . Chest pain at rest 09/15/2017  . Chronic diastolic CHF (congestive heart failure) ([place]) 02/25/2020  . Chronic kidney disease (CKD), stage III  (moderate) ([place]) 09/15/2017  . Coronary artery disease   . Edema   . Encounter for therapeutic drug monitoring 10/14/2016  . History of TIA (transient ischemic attack) 07/27/2019  . Hx of CABG   . Hyperlipemia 09/24/2016  . Hyperlipidemia   . Hyperlipidemia LDL goal <70   . Hypertension   . Ischemic cardiomyopathy 10/22/2016  . Myocardial infarction ([place])   . Neck pain 10/29/2015  . Non-ST elevation (NSTEMI) myocardial infarction ([place])   . NSTEMI (non-ST elevated myocardial infarction) ([place]) 09/24/2016  . Numbness in left leg 10/29/2015  . Paroxysmal tachycardia ([place])   . Pleural effusion   . Presence of aortocoronary bypass graft 10/08/2016   LIMA to LAD -SVG to DIAG SVG to RAMUS SVG to PDA  All grafts occluded except LIMA based on cardiac catheterization 07/2016  . Respiratory failure ([place])   . S/P CABG x 4 10/08/2016   LIMA to LAD -SVG to DIAG SVG to RAMUS SVG to PDA  All grafts occluded except LIMA based on cardiac catheterization 07/2016  . Weakness 10/29/2015  . Weakness of both arms 10/29/2015    Past Surgical History:  Procedure Laterality Date  . CORONARY ARTERY BYPASS GRAFT N/A 09/28/2016   Procedure: CORONARY ARTERY BYPASS GRAFTING (CABG) x four, using left internal mammary artery and right leg greater saphenous vein harvested endoscopically;  Surgeon: Ivin Poot, MD;  Location: [facility];  Service: Open Heart Surgery;  Laterality: N/A;  . IR THORACENTESIS ASP PLEURAL  SPACE W/IMG GUIDE  10/06/2016  . LEFT HEART CATH AND CORONARY ANGIOGRAPHY N/A 09/24/2016   Procedure: Left Heart Cath and Coronary Angiography;  Surgeon: Lorretta Harp, MD;  Location: [facility];  Service: Cardiovascular;  Laterality: N/A;  . LEFT HEART CATH AND CORS/GRAFTS ANGIOGRAPHY N/A 02/11/2017   Procedure: LEFT HEART CATH AND CORS/GRAFTS ANGIOGRAPHY;  Surgeon: Jettie Booze, MD;  Location: [facility];  Service: Cardiovascular;  Laterality: N/A;  . LEFT HEART CATH AND CORS/GRAFTS ANGIOGRAPHY  N/A 02/27/2020   Procedure: LEFT HEART CATH AND CORS/GRAFTS ANGIOGRAPHY;  Surgeon: Martinique, Peter M, MD;  Location: [facility];  Service: Cardiovascular;  Laterality: N/A;  . TEE WITHOUT CARDIOVERSION N/A 09/28/2016   Procedure: TRANSESOPHAGEAL ECHOCARDIOGRAM (TEE);  Surgeon: Prescott Gum, Collier Salina, MD;  Location: [facility];  Service: Open Heart Surgery;  Laterality: N/A;  . ULTRASOUND GUIDANCE FOR VASCULAR ACCESS  02/11/2017   Procedure: Ultrasound Guidance For Vascular Access;  Surgeon: Jettie Booze, MD;  Location: [facility];  Service: Cardiovascular;;    Current Medications: Current Meds  Medication Sig  . aspirin 81 MG EC tablet Take 1 tablet (81 mg total) by mouth daily.  . furosemide (LASIX) 40 MG tablet TAKE 1 TABLET DAILY  . isosorbide mononitrate (IMDUR) 60 MG 24 hr tablet Take 2 tablets (120 mg total) by mouth daily.  . memantine (NAMENDA) 5 MG tablet Take 5 mg by mouth 2 (two) times daily.   . metoprolol tartrate (LOPRESSOR) 25 MG tablet Take 0.5 tablets (12.5 mg total) by mouth 2 (two) times daily.  . nitroGLYCERIN (NITRODUR - DOSED IN MG/24 HR) 0.1 mg/hr patch PLACE 1 PATCH ON THE SKIN DAILY (Patient taking differently: Place 0.1 mg onto the skin daily. )  . nitroGLYCERIN (NITROSTAT) 0.4 MG SL tablet Place 0.4 mg under the tongue every 5 (five) minutes as needed for chest pain.   . pantoprazole (PROTONIX) 40 MG tablet Take 40 mg by mouth in the morning and at bedtime.   . potassium chloride (KLOR-CON) 10 MEQ tablet Take 1 tablet (10 mEq total) by mouth daily.  . ranolazine (RANEXA) 1000 MG SR tablet TAKE 1  BY MOUTH TWICE DAILY (Patient taking differently: Take 1,000 mg by mouth 2 (two) times daily. )  . rosuvastatin (CRESTOR) 20 MG tablet Take 1 tablet (20 mg total) by mouth daily at 6 PM.  . XARELTO 2.5 MG TABS tablet TAKE 1 TABLET BY MOUTH TWICE A DAY (Patient taking differently: Take 2.5 mg by mouth 2 (two) times daily. )     Allergies:   Benadryl  [diphenhydramine hcl (sleep)] and Levofloxacin   Social History   Socioeconomic History  . Marital status: Married    Spouse name: Not on file  . Number of children: Not on file  . Years of education: Not on file  . Highest education level: Not on file  Occupational History  . Occupation: Retired  Tobacco Use  . Smoking status: Never Smoker  . Smokeless tobacco: Never Used  Vaping Use  . Vaping Use: Never used  Substance and Sexual Activity  . Alcohol use: No  . Drug use: No  . Sexual activity: Yes    Birth control/protection: None  Other Topics Concern  . Not on file  Social History Narrative  . Not on file   Social Determinants of Health   Financial Resource Strain:   . Difficulty of Paying Living Expenses: Not on file  Food Insecurity:   .  Worried About Charity fundraiser in the Last Year: Not on file  . Ran Out of Food in the Last Year: Not on file  Transportation Needs:   . Lack of Transportation (Medical): Not on file  . Lack of Transportation (Non-Medical): Not on file  Physical Activity:   . Days of Exercise per Week: Not on file  . Minutes of Exercise per Session: Not on file  Stress:   . Feeling of Stress : Not on file  Social Connections:   . Frequency of Communication with Friends and Family: Not on file  . Frequency of Social Gatherings with Friends and Family: Not on file  . Attends Religious Services: Not on file  . Active Member of Clubs or Organizations: Not on file  . Attends Archivist Meetings: Not on file  . Marital Status: Not on file     Family History: The patient's family history includes Heart attack in his father; Heart failure in his mother. ROS:   Please see the history of present illness.    All 14 point review of systems negative except as described per history of present illness  EKGs/Labs/Other Studies Reviewed:      Recent Labs: 10/24/2019: NT-Pro BNP 169 02/25/2020: ALT 24; B Natriuretic Peptide 102.1;  Hemoglobin 13.7; Platelets 161; TSH 1.771 02/26/2020: Magnesium 2.1 02/28/2020: BUN 18; Creatinine, Ser 1.25; Potassium 4.1; Sodium 138  Recent Lipid Panel    Component Value Date/Time   CHOL 101 02/27/2020 0343   CHOL 96 (L) 09/14/2017 0840   TRIG 97 02/27/2020 0343   HDL 36 (L) 02/27/2020 0343   HDL 42 09/14/2017 0840   CHOLHDL 2.8 02/27/2020 0343   VLDL 19 02/27/2020 0343   LDLCALC 46 02/27/2020 0343   LDLCALC 32 09/14/2017 0840    Physical Exam:    VS:  BP 106/66 (BP Location: Left Arm, Patient Position: Sitting)   Pulse 81   Ht 5\' 6"  (1.676 m)   Wt 209 lb (94.8 kg)   SpO2 95%   BMI 33.73 kg/m     Wt Readings from Last 3 Encounters:  03/25/20 209 lb (94.8 kg)  02/28/20 201 lb (91.2 kg)  11/28/19 203 lb 3.2 oz (92.2 kg)     GEN:  Well nourished, well developed in no acute distress HEENT: Normal NECK: No JVD; No carotid bruits LYMPHATICS: No lymphadenopathy CARDIAC: RRR, no murmurs, no rubs, no gallops RESPIRATORY:  Clear to auscultation without rales, wheezing or rhonchi  ABDOMEN: Soft, non-tender, non-distended MUSCULOSKELETAL:  No edema; No deformity  SKIN: Warm and dry LOWER EXTREMITIES: no swelling NEUROLOGIC:  Alert and oriented x 3 PSYCHIATRIC:  Normal affect   ASSESSMENT:    1. Ischemic cardiomyopathy   2. Coronary artery disease of native artery of native heart with stable angina pectoris ([place])   3. Acute on chronic diastolic CHF (congestive heart failure) ([place])   4. S/P CABG x 4    PLAN:    In order of problems listed above:  1. Ischemic cardiomyopathy: Ejection fraction normal based on last echocardiogram.  I will check proBNP today to make sure if there is anything else I can do with diuretics to help with the situation. 2. Advanced coronary artery disease with a lot of distal disease, no targets for interventions based on latest cardiac catheterization from just few weeks ago.  I will continue present medication which include ranolazine, his  amlodipine has been discontinued, he is still on very small dose of metoprolol trouble continue.  He takes long-acting nitroglycerin he was asked to reduce Imdur from 3 tablets in the morning to only 2 tablets in the morning he started complaining back about chest pain.  I advised him to go back on 3 Imdur's in the morning at once. 3. Status post coronary bypass graft.  Noted. 4. Dyslipidemia: He is on Crestor 20 which I will continue.  Last K PN data showed me LDL 46 HDL 36 this is from 02/27/2020.   Medication Adjustments/Labs and Tests Ordered: Current medicines are reviewed at length with the patient today.  Concerns regarding medicines are outlined above.  No orders of the defined types were placed in this encounter.  Medication changes: No orders of the defined types were placed in this encounter.   Signed, Park Liter, MD, [facility] 03/25/2020 10:33 AM    [place]

## 2020-03-25 NOTE — Patient Instructions (Addendum)
   Medication Instructions:  Your physician recommends that you continue on your current medications as directed. Please refer to the Current Medication list given to you today.  *If you need a refill on your cardiac medications before your next appointment, please call your pharmacy*   Lab Work: Your physician recommends that you return for lab work today: bmp, pro bnp   If you have labs (blood work) drawn today and your tests are completely normal, you will receive your results only by: Marland Kitchen MyChart Message (if you have MyChart) OR . A paper copy in the mail If you have any lab test that is abnormal or we need to change your treatment, we will call you to review the results.   Testing/Procedures: None.   Follow-Up: At [facility], you and your health needs are our priority.  As part of our continuing mission to provide you with exceptional heart care, we have created designated Provider Care Teams.  These Care Teams include your primary Cardiologist (physician) and Advanced Practice Providers (APPs -  Physician Assistants and Nurse Practitioners) who all work together to provide you with the care you need, when you need it.  We recommend signing up for the patient portal called "MyChart".  Sign up information is provided on this After Visit Summary.  MyChart is used to connect with patients for Virtual Visits (Telemedicine).  Patients are able to view lab/test results, encounter notes, upcoming appointments, etc.  Non-urgent messages can be sent to your provider as well.   To learn more about what you can do with MyChart, go to [email or web address].    Your next appointment:   1 month(s)  The format for your next appointment:   In Person  Provider:   Jenne Campus, MD   Other Instructions

## 2020-03-26 LAB — BASIC METABOLIC PANEL
BUN/Creatinine Ratio: 12 (ref 10–24)
BUN: 18 mg/dL (ref 8–27)
CO2: 25 mmol/L (ref 20–29)
Calcium: 9.1 mg/dL (ref 8.6–10.2)
Chloride: 100 mmol/L (ref 96–106)
Creatinine, Ser: 1.45 mg/dL — ABNORMAL HIGH (ref 0.76–1.27)
GFR calc Af Amer: 55 mL/min/{1.73_m2} — ABNORMAL LOW (ref >59)
GFR calc non Af Amer: 48 mL/min/{1.73_m2} — ABNORMAL LOW (ref >59)
Glucose: 105 mg/dL — ABNORMAL HIGH (ref 65–99)
Potassium: 4.3 mmol/L (ref 3.5–5.2)
Sodium: 141 mmol/L (ref 134–144)

## 2020-03-26 LAB — PRO B NATRIURETIC PEPTIDE: NT-Pro BNP: 153 pg/mL (ref 0–376)

## 2020-03-30 ENCOUNTER — Other Ambulatory Visit: Payer: Self-pay | Admitting: Cardiology

## 2020-04-01 ENCOUNTER — Other Ambulatory Visit: Payer: Self-pay | Admitting: Cardiology

## 2020-04-11 ENCOUNTER — Ambulatory Visit (INDEPENDENT_AMBULATORY_CARE_PROVIDER_SITE_OTHER): Payer: Medicare Other

## 2020-04-11 DIAGNOSIS — I639 Cerebral infarction, unspecified: Secondary | ICD-10-CM

## 2020-04-15 LAB — CUP PACEART REMOTE DEVICE CHECK
Date Time Interrogation Session: 20211225100611
Implantable Pulse Generator Implant Date: 20210610

## 2020-04-24 DIAGNOSIS — I10 Essential (primary) hypertension: Secondary | ICD-10-CM | POA: Insufficient documentation

## 2020-04-24 DIAGNOSIS — R609 Edema, unspecified: Secondary | ICD-10-CM | POA: Insufficient documentation

## 2020-04-24 DIAGNOSIS — I479 Paroxysmal tachycardia, unspecified: Secondary | ICD-10-CM | POA: Insufficient documentation

## 2020-04-25 NOTE — Progress Notes (Signed)
Carelink Summary Report / Loop Recorder 

## 2020-04-26 ENCOUNTER — Other Ambulatory Visit: Payer: Self-pay

## 2020-04-26 ENCOUNTER — Ambulatory Visit (INDEPENDENT_AMBULATORY_CARE_PROVIDER_SITE_OTHER): Payer: Medicare Other | Admitting: Cardiology

## 2020-04-26 ENCOUNTER — Encounter: Payer: Self-pay | Admitting: Cardiology

## 2020-04-26 VITALS — BP 118/70 | HR 56 | Ht 66.0 in | Wt 210.0 lb

## 2020-04-26 DIAGNOSIS — I25118 Atherosclerotic heart disease of native coronary artery with other forms of angina pectoris: Secondary | ICD-10-CM

## 2020-04-26 DIAGNOSIS — Z951 Presence of aortocoronary bypass graft: Secondary | ICD-10-CM | POA: Diagnosis not present

## 2020-04-26 DIAGNOSIS — I209 Angina pectoris, unspecified: Secondary | ICD-10-CM | POA: Diagnosis not present

## 2020-04-26 DIAGNOSIS — I5032 Chronic diastolic (congestive) heart failure: Secondary | ICD-10-CM | POA: Diagnosis not present

## 2020-04-26 MED ORDER — ROSUVASTATIN CALCIUM 20 MG PO TABS: 20.0000 mg | ORAL_TABLET | Freq: Every day | ORAL | 1 refills | Status: DC

## 2020-04-26 NOTE — Patient Instructions (Signed)

## 2020-04-26 NOTE — Addendum Note (Signed)
Addended by: Senaida Ores on: 04/26/2020 11:25 AM   Modules accepted: Orders

## 2020-04-26 NOTE — Progress Notes (Signed)
Cardiology Office Note:    Date:  04/26/2020   ID:  Ernest Booker, DOB 09/08/1945, MRN [id number]  PCP:  [name], MD  Cardiologist:  Jenne Campus, MD    Referring MD: [name], MD   Chief Complaint  Patient presents with  . Medication Refill  . Follow-up    History of Present Illness:    Ernest Booker is a 75 y.o. male with complex past medical history.  Few years ago he did have coronary artery bypass graft however shortly after that within 6 months he occluded 3 out of 4 grafts the only graft openings LIMA to LAD.  Recently he had cardiac catheterization redone which showed no target lesions for intervention.  His ejection fraction was normal.  He does have dyslipidemia.  He is struggling with episode of angina.  I am trying to do the best to manage this medically with some successes.  Recently I increased dose of Imdur and he comes today he said he feels better he got not as frequent episode of angina in the matter-of-fact yesterday he was trying to work some in the garden today he is exhausted after that but did not have much chest pain.  Past Medical History:  Diagnosis Date  . Acute on chronic diastolic CHF (congestive heart failure) ([place]) 02/25/2020  . Angina pectoris ([place])   . Anginal pain ([place])   . Arthritis   . Atrial fibrillation ([place]) 09/15/2017  . Bilateral numbness and tingling of arms and legs 10/29/2015  . CAD (coronary artery disease) 09/28/2016  . Cardiogenic shock ([place])   . Cervical disc disease 10/29/2015  . Chest pain at rest 09/15/2017  . Chronic diastolic CHF (congestive heart failure) ([place]) 02/25/2020  . Chronic kidney disease (CKD), stage III (moderate) ([place]) 09/15/2017  . Coronary artery disease   . Edema   . Encounter for therapeutic drug monitoring 10/14/2016  . History of TIA (transient ischemic attack) 07/27/2019  . Hx of CABG   . Hyperlipemia 09/24/2016  . Hyperlipidemia   . Hyperlipidemia LDL goal <70   . Hypertension   . Ischemic  cardiomyopathy 10/22/2016  . Myocardial infarction ([place])   . Neck pain 10/29/2015  . Non-ST elevation (NSTEMI) myocardial infarction ([place])   . NSTEMI (non-ST elevated myocardial infarction) ([place]) 09/24/2016  . Numbness in left leg 10/29/2015  . Paroxysmal tachycardia ([place])   . Pleural effusion   . Presence of aortocoronary bypass graft 10/08/2016   LIMA to LAD -SVG to DIAG SVG to RAMUS SVG to PDA  All grafts occluded except LIMA based on cardiac catheterization 07/2016  . Respiratory failure ([place])   . S/P CABG x 4 10/08/2016   LIMA to LAD -SVG to DIAG SVG to RAMUS SVG to PDA  All grafts occluded except LIMA based on cardiac catheterization 07/2016  . Shortness of breath   . Weakness 10/29/2015  . Weakness of both arms 10/29/2015    Past Surgical History:  Procedure Laterality Date  . CORONARY ARTERY BYPASS GRAFT N/A 09/28/2016   Procedure: CORONARY ARTERY BYPASS GRAFTING (CABG) x four, using left internal mammary artery and right leg greater saphenous vein harvested endoscopically;  Surgeon: Ivin Poot, MD;  Location: [facility];  Service: Open Heart Surgery;  Laterality: N/A;  . IR THORACENTESIS ASP PLEURAL SPACE W/IMG GUIDE  10/06/2016  . LEFT HEART CATH AND CORONARY ANGIOGRAPHY N/A 09/24/2016   Procedure: Left Heart Cath and Coronary Angiography;  Surgeon: Lorretta Harp, MD;  Location: [facility];  Service: Cardiovascular;  Laterality: N/A;  . LEFT HEART CATH AND CORS/GRAFTS ANGIOGRAPHY N/A 02/11/2017   Procedure: LEFT HEART CATH AND CORS/GRAFTS ANGIOGRAPHY;  Surgeon: Jettie Booze, MD;  Location: [facility];  Service: Cardiovascular;  Laterality: N/A;  . LEFT HEART CATH AND CORS/GRAFTS ANGIOGRAPHY N/A 02/27/2020   Procedure: LEFT HEART CATH AND CORS/GRAFTS ANGIOGRAPHY;  Surgeon: Martinique, Peter M, MD;  Location: [facility];  Service: Cardiovascular;  Laterality: N/A;  . TEE WITHOUT CARDIOVERSION N/A 09/28/2016   Procedure: TRANSESOPHAGEAL ECHOCARDIOGRAM (TEE);   Surgeon: Prescott Gum, Collier Salina, MD;  Location: [facility];  Service: Open Heart Surgery;  Laterality: N/A;  . ULTRASOUND GUIDANCE FOR VASCULAR ACCESS  02/11/2017   Procedure: Ultrasound Guidance For Vascular Access;  Surgeon: Jettie Booze, MD;  Location: [facility];  Service: Cardiovascular;;    Current Medications: Current Meds  Medication Sig  . aspirin 81 MG EC tablet Take 1 tablet (81 mg total) by mouth daily.  . furosemide (LASIX) 40 MG tablet TAKE 1 TABLET BY MOUTH EVERY DAY  . isosorbide mononitrate (IMDUR) 60 MG 24 hr tablet TAKE 3 TABLETS (180 MG TOTAL) BY MOUTH DAILY.  . memantine (NAMENDA) 5 MG tablet Take 5 mg by mouth 2 (two) times daily.   . metoprolol tartrate (LOPRESSOR) 25 MG tablet Take 0.5 tablets (12.5 mg total) by mouth 2 (two) times daily.  . nitroGLYCERIN (NITRODUR - DOSED IN MG/24 HR) 0.1 mg/hr patch PLACE 1 PATCH ON THE SKIN DAILY (Patient taking differently: Place 0.1 mg onto the skin daily.)  . nitroGLYCERIN (NITROSTAT) 0.4 MG SL tablet Place 0.4 mg under the tongue every 5 (five) minutes as needed for chest pain.   . pantoprazole (PROTONIX) 40 MG tablet Take 40 mg by mouth in the morning and at bedtime.   . potassium chloride (KLOR-CON) 10 MEQ tablet TAKE 1 TABLET BY MOUTH EVERY DAY  . ranolazine (RANEXA) 1000 MG SR tablet TAKE 1  BY MOUTH TWICE DAILY (Patient taking differently: Take 1,000 mg by mouth 2 (two) times daily.)  . rosuvastatin (CRESTOR) 20 MG tablet Take 1 tablet (20 mg total) by mouth daily at 6 PM.  . XARELTO 2.5 MG TABS tablet TAKE 1 TABLET BY MOUTH TWICE A DAY (Patient taking differently: Take 2.5 mg by mouth 2 (two) times daily.)     Allergies:   Benadryl [diphenhydramine hcl (sleep)] and Levofloxacin   Social History   Socioeconomic History  . Marital status: Married    Spouse name: Not on file  . Number of children: Not on file  . Years of education: Not on file  . Highest education level: Not on file  Occupational History  .  Occupation: Retired  Tobacco Use  . Smoking status: Never Smoker  . Smokeless tobacco: Never Used  Vaping Use  . Vaping Use: Never used  Substance and Sexual Activity  . Alcohol use: No  . Drug use: No  . Sexual activity: Yes    Birth control/protection: None  Other Topics Concern  . Not on file  Social History Narrative  . Not on file   Social Determinants of Health   Financial Resource Strain: Not on file  Food Insecurity: Not on file  Transportation Needs: Not on file  Physical Activity: Not on file  Stress: Not on file  Social Connections: Not on file     Family History: The patient's family history includes Heart attack in his father; Heart failure in his mother. ROS:  Please see the history of present illness.    All 14 point review of systems negative except as described per history of present illness  EKGs/Labs/Other Studies Reviewed:      Recent Labs: 02/25/2020: ALT 24; B Natriuretic Peptide 102.1; Hemoglobin 13.7; Platelets 161; TSH 1.771 02/26/2020: Magnesium 2.1 03/25/2020: BUN 18; Creatinine, Ser 1.45; NT-Pro BNP 153; Potassium 4.3; Sodium 141  Recent Lipid Panel    Component Value Date/Time   CHOL 101 02/27/2020 0343   CHOL 96 (L) 09/14/2017 0840   TRIG 97 02/27/2020 0343   HDL 36 (L) 02/27/2020 0343   HDL 42 09/14/2017 0840   CHOLHDL 2.8 02/27/2020 0343   VLDL 19 02/27/2020 0343   LDLCALC 46 02/27/2020 0343   LDLCALC 32 09/14/2017 0840    Physical Exam:    VS:  BP 118/70 (BP Location: Left Arm, Patient Position: Sitting)   Pulse (!) 56   Ht 5\' 6"  (1.676 m)   Wt 210 lb (95.3 kg)   SpO2 94%   BMI 33.89 kg/m     Wt Readings from Last 3 Encounters:  04/26/20 210 lb (95.3 kg)  03/25/20 209 lb (94.8 kg)  02/28/20 201 lb (91.2 kg)     GEN:  Well nourished, well developed in no acute distress HEENT: Normal NECK: No JVD; No carotid bruits LYMPHATICS: No lymphadenopathy CARDIAC: RRR, no murmurs, no rubs, no gallops RESPIRATORY:  Clear to  auscultation without rales, wheezing or rhonchi  ABDOMEN: Soft, non-tender, non-distended MUSCULOSKELETAL:  No edema; No deformity  SKIN: Warm and dry LOWER EXTREMITIES: no swelling NEUROLOGIC:  Alert and oriented x 3 PSYCHIATRIC:  Normal affect   ASSESSMENT:    1. Coronary artery disease of native artery of native heart with stable angina pectoris ([place])   2. Angina pectoris ([place])   3. Chronic diastolic CHF (congestive heart failure) ([place])   4. S/P CABG x 4    PLAN:    In order of problems listed above:  1. Coronary disease which is advanced.  Medical therapy.  I will not alter any medications today.  He seems to be doing reasonably well.  I encouraged him to be L be more active. 2. Angina pectoris stable episodes continue present management. 3. Chronic diastolic congestive heart failure appears to be compensated. 4. Status post coronary artery bypass graft noted. 5. Dyslipidemia he is on rosuvastatin I did review his K PN which show me his LDL of 46 HDL 36.  We will continue present management.   Medication Adjustments/Labs and Tests Ordered: Current medicines are reviewed at length with the patient today.  Concerns regarding medicines are outlined above.  No orders of the defined types were placed in this encounter.  Medication changes: No orders of the defined types were placed in this encounter.   Signed, Park Liter, MD, [facility] 04/26/2020 11:07 AM    [place]

## 2020-04-30 DIAGNOSIS — M19011 Primary osteoarthritis, right shoulder: Secondary | ICD-10-CM | POA: Diagnosis not present

## 2020-05-13 ENCOUNTER — Ambulatory Visit (INDEPENDENT_AMBULATORY_CARE_PROVIDER_SITE_OTHER): Payer: Medicare Other

## 2020-05-13 DIAGNOSIS — I255 Ischemic cardiomyopathy: Secondary | ICD-10-CM | POA: Diagnosis not present

## 2020-05-16 LAB — CUP PACEART REMOTE DEVICE CHECK
Date Time Interrogation Session: 20220127100912
Implantable Pulse Generator Implant Date: 20210610

## 2020-05-23 NOTE — Progress Notes (Signed)
Carelink Summary Report / Loop Recorder 

## 2020-06-11 DIAGNOSIS — C44612 Basal cell carcinoma of skin of right upper limb, including shoulder: Secondary | ICD-10-CM | POA: Diagnosis not present

## 2020-06-11 DIAGNOSIS — L821 Other seborrheic keratosis: Secondary | ICD-10-CM | POA: Diagnosis not present

## 2020-06-11 DIAGNOSIS — L570 Actinic keratosis: Secondary | ICD-10-CM | POA: Diagnosis not present

## 2020-06-11 DIAGNOSIS — L578 Other skin changes due to chronic exposure to nonionizing radiation: Secondary | ICD-10-CM | POA: Diagnosis not present

## 2020-06-19 ENCOUNTER — Ambulatory Visit (INDEPENDENT_AMBULATORY_CARE_PROVIDER_SITE_OTHER): Payer: Medicare Other

## 2020-06-19 DIAGNOSIS — I255 Ischemic cardiomyopathy: Secondary | ICD-10-CM | POA: Diagnosis not present

## 2020-06-20 LAB — CUP PACEART REMOTE DEVICE CHECK
Date Time Interrogation Session: 20220301101359
Implantable Pulse Generator Implant Date: 20210610

## 2020-06-21 DIAGNOSIS — R413 Other amnesia: Secondary | ICD-10-CM | POA: Diagnosis not present

## 2020-06-21 DIAGNOSIS — G4733 Obstructive sleep apnea (adult) (pediatric): Secondary | ICD-10-CM | POA: Diagnosis not present

## 2020-06-21 DIAGNOSIS — K219 Gastro-esophageal reflux disease without esophagitis: Secondary | ICD-10-CM | POA: Diagnosis not present

## 2020-06-21 DIAGNOSIS — Z6833 Body mass index (BMI) 33.0-33.9, adult: Secondary | ICD-10-CM | POA: Diagnosis not present

## 2020-06-21 DIAGNOSIS — E785 Hyperlipidemia, unspecified: Secondary | ICD-10-CM | POA: Diagnosis not present

## 2020-06-21 DIAGNOSIS — I25709 Atherosclerosis of coronary artery bypass graft(s), unspecified, with unspecified angina pectoris: Secondary | ICD-10-CM | POA: Diagnosis not present

## 2020-06-27 NOTE — Progress Notes (Signed)
Carelink Summary Report / Loop Recorder 

## 2020-07-22 ENCOUNTER — Ambulatory Visit (INDEPENDENT_AMBULATORY_CARE_PROVIDER_SITE_OTHER): Payer: Medicare Other

## 2020-07-22 DIAGNOSIS — I255 Ischemic cardiomyopathy: Secondary | ICD-10-CM | POA: Diagnosis not present

## 2020-07-23 DIAGNOSIS — Z2839 Other underimmunization status: Secondary | ICD-10-CM | POA: Diagnosis not present

## 2020-07-23 DIAGNOSIS — I130 Hypertensive heart and chronic kidney disease with heart failure and stage 1 through stage 4 chronic kidney disease, or unspecified chronic kidney disease: Secondary | ICD-10-CM | POA: Diagnosis not present

## 2020-07-23 DIAGNOSIS — Z79899 Other long term (current) drug therapy: Secondary | ICD-10-CM | POA: Diagnosis not present

## 2020-07-23 DIAGNOSIS — E785 Hyperlipidemia, unspecified: Secondary | ICD-10-CM | POA: Diagnosis not present

## 2020-07-23 DIAGNOSIS — R001 Bradycardia, unspecified: Secondary | ICD-10-CM | POA: Diagnosis not present

## 2020-07-23 DIAGNOSIS — Z7901 Long term (current) use of anticoagulants: Secondary | ICD-10-CM | POA: Diagnosis not present

## 2020-07-23 DIAGNOSIS — M19012 Primary osteoarthritis, left shoulder: Secondary | ICD-10-CM | POA: Diagnosis not present

## 2020-07-23 DIAGNOSIS — I25708 Atherosclerosis of coronary artery bypass graft(s), unspecified, with other forms of angina pectoris: Secondary | ICD-10-CM | POA: Diagnosis not present

## 2020-07-23 DIAGNOSIS — Z951 Presence of aortocoronary bypass graft: Secondary | ICD-10-CM | POA: Diagnosis not present

## 2020-07-23 DIAGNOSIS — I5032 Chronic diastolic (congestive) heart failure: Secondary | ICD-10-CM | POA: Diagnosis not present

## 2020-07-23 DIAGNOSIS — G8929 Other chronic pain: Secondary | ICD-10-CM | POA: Diagnosis not present

## 2020-07-23 DIAGNOSIS — R0602 Shortness of breath: Secondary | ICD-10-CM | POA: Diagnosis not present

## 2020-07-23 DIAGNOSIS — Z7982 Long term (current) use of aspirin: Secondary | ICD-10-CM | POA: Diagnosis not present

## 2020-07-23 DIAGNOSIS — M25512 Pain in left shoulder: Secondary | ICD-10-CM | POA: Diagnosis not present

## 2020-07-23 DIAGNOSIS — K219 Gastro-esophageal reflux disease without esophagitis: Secondary | ICD-10-CM | POA: Diagnosis not present

## 2020-07-23 DIAGNOSIS — I25810 Atherosclerosis of coronary artery bypass graft(s) without angina pectoris: Secondary | ICD-10-CM | POA: Diagnosis not present

## 2020-07-23 DIAGNOSIS — N183 Chronic kidney disease, stage 3 unspecified: Secondary | ICD-10-CM | POA: Diagnosis not present

## 2020-07-23 DIAGNOSIS — R079 Chest pain, unspecified: Secondary | ICD-10-CM | POA: Diagnosis not present

## 2020-07-23 LAB — CUP PACEART REMOTE DEVICE CHECK
Date Time Interrogation Session: 20220403111846
Implantable Pulse Generator Implant Date: 20210610

## 2020-07-24 ENCOUNTER — Telehealth: Payer: Self-pay

## 2020-07-24 DIAGNOSIS — I25810 Atherosclerosis of coronary artery bypass graft(s) without angina pectoris: Secondary | ICD-10-CM | POA: Diagnosis not present

## 2020-07-24 DIAGNOSIS — I10 Essential (primary) hypertension: Secondary | ICD-10-CM | POA: Diagnosis not present

## 2020-07-24 DIAGNOSIS — E785 Hyperlipidemia, unspecified: Secondary | ICD-10-CM | POA: Diagnosis not present

## 2020-07-24 DIAGNOSIS — Z7901 Long term (current) use of anticoagulants: Secondary | ICD-10-CM | POA: Diagnosis not present

## 2020-07-24 NOTE — Telephone Encounter (Signed)
[employer] Patient Assistance Application completed and faxed

## 2020-08-01 NOTE — Progress Notes (Signed)
Carelink Summary Report / Loop Recorder 

## 2020-08-02 DIAGNOSIS — R609 Edema, unspecified: Secondary | ICD-10-CM | POA: Insufficient documentation

## 2020-08-02 DIAGNOSIS — Z951 Presence of aortocoronary bypass graft: Secondary | ICD-10-CM | POA: Insufficient documentation

## 2020-08-05 ENCOUNTER — Other Ambulatory Visit: Payer: Self-pay

## 2020-08-05 ENCOUNTER — Ambulatory Visit (INDEPENDENT_AMBULATORY_CARE_PROVIDER_SITE_OTHER): Payer: Medicare Other | Admitting: Cardiology

## 2020-08-05 ENCOUNTER — Encounter: Payer: Self-pay | Admitting: Cardiology

## 2020-08-05 VITALS — BP 138/80 | HR 59 | Ht 66.0 in | Wt 206.0 lb

## 2020-08-05 DIAGNOSIS — Z951 Presence of aortocoronary bypass graft: Secondary | ICD-10-CM

## 2020-08-05 DIAGNOSIS — I255 Ischemic cardiomyopathy: Secondary | ICD-10-CM | POA: Diagnosis not present

## 2020-08-05 DIAGNOSIS — E782 Mixed hyperlipidemia: Secondary | ICD-10-CM

## 2020-08-05 DIAGNOSIS — I10 Essential (primary) hypertension: Secondary | ICD-10-CM | POA: Diagnosis not present

## 2020-08-05 MED ORDER — ISOSORBIDE MONONITRATE ER 60 MG PO TB24
240.0000 mg | ORAL_TABLET | Freq: Every day | ORAL | 1 refills | Status: DC
Start: 2020-08-05 — End: 2020-09-02

## 2020-08-05 NOTE — Patient Instructions (Signed)

## 2020-08-05 NOTE — Progress Notes (Signed)
Cardiology Office Note:    Date:  08/05/2020   ID:  Ernest Booker, DOB Apr 29, 1945, MRN [id number]  PCP:  [name], MD  Cardiologist:  Jenne Campus, MD    Referring MD: [name], MD   Chief Complaint  Patient presents with  . follow up chest pain  Doing better  History of Present Illness:    Ernest Booker is a 75 y.o. male with past medical history significant for coronary artery disease, years ago he had coronary artery bypass graft however within 6 months he occluded 3 out of 4 grafts Graft workings LIMA to LAD, he did have cardiac catheterization we do not in October 2021 which showed basically similar finding without target lesion for intervention.  Also history of paroxysmal atrial fibrillation, dyslipidemia, essential hypertension. Comes today 2 months for follow-up overall he is doing well now.  Recently he ended going to the hospital because of episode of profound weakness of his legs as well as prolonged episode of chest pain.  It was in April 52,022.  He ruled out for myocardial infarction his medication has been adjusted he is being discharged home.  Since that time he is doing well.  He takes nitroglycerin on a regular basis.  However there is no increase in frequency of need to use nitroglycerin as well as there is no increased duration of those episodes.  Past Medical History:  Diagnosis Date  . Acute on chronic diastolic CHF (congestive heart failure) ([place]) 02/25/2020  . Angina pectoris ([place])   . Anginal pain ([place])   . Arthritis   . Atrial fibrillation ([place]) 09/15/2017  . Bilateral numbness and tingling of arms and legs 10/29/2015  . CAD (coronary artery disease) 09/28/2016  . Cardiogenic shock ([place])   . Cervical disc disease 10/29/2015  . Chest pain at rest 09/15/2017  . Chronic diastolic CHF (congestive heart failure) ([place]) 02/25/2020  . Chronic kidney disease (CKD), stage III (moderate) ([place]) 09/15/2017  . Coronary artery disease   . Edema   .  Encounter for therapeutic drug monitoring 10/14/2016  . History of TIA (transient ischemic attack) 07/27/2019  . Hx of CABG   . Hyperlipemia 09/24/2016  . Hyperlipidemia   . Hyperlipidemia LDL goal <70   . Hypertension   . Ischemic cardiomyopathy 10/22/2016  . Myocardial infarction ([place])   . Neck pain 10/29/2015  . Non-ST elevation (NSTEMI) myocardial infarction ([place])   . NSTEMI (non-ST elevated myocardial infarction) ([place]) 09/24/2016  . Numbness in left leg 10/29/2015  . Paroxysmal tachycardia ([place])   . Pleural effusion   . Presence of aortocoronary bypass graft 10/08/2016   LIMA to LAD -SVG to DIAG SVG to RAMUS SVG to PDA  All grafts occluded except LIMA based on cardiac catheterization 07/2016  . Respiratory failure ([place])   . S/P CABG x 4 10/08/2016   LIMA to LAD -SVG to DIAG SVG to RAMUS SVG to PDA  All grafts occluded except LIMA based on cardiac catheterization 07/2016  . Shortness of breath   . Weakness 10/29/2015  . Weakness of both arms 10/29/2015    Past Surgical History:  Procedure Laterality Date  . CORONARY ARTERY BYPASS GRAFT N/A 09/28/2016   Procedure: CORONARY ARTERY BYPASS GRAFTING (CABG) x four, using left internal mammary artery and right leg greater saphenous vein harvested endoscopically;  Surgeon: Ivin Poot, MD;  Location: [place];  Service: Open Heart Surgery;  Laterality: N/A;  . IR THORACENTESIS ASP PLEURAL SPACE W/IMG GUIDE  10/06/2016  . LEFT  HEART CATH AND CORONARY ANGIOGRAPHY N/A 09/24/2016   Procedure: Left Heart Cath and Coronary Angiography;  Surgeon: Lorretta Harp, MD;  Location: [place] [facility];  Service: Cardiovascular;  Laterality: N/A;  . LEFT HEART CATH AND CORS/GRAFTS ANGIOGRAPHY N/A 02/11/2017   Procedure: LEFT HEART CATH AND CORS/GRAFTS ANGIOGRAPHY;  Surgeon: Jettie Booze, MD;  Location: [place] [facility];  Service: Cardiovascular;  Laterality: N/A;  . LEFT HEART CATH AND CORS/GRAFTS ANGIOGRAPHY N/A 02/27/2020   Procedure: LEFT HEART CATH AND  CORS/GRAFTS ANGIOGRAPHY;  Surgeon: Martinique, Peter M, MD;  Location: [place] [facility];  Service: Cardiovascular;  Laterality: N/A;  . TEE WITHOUT CARDIOVERSION N/A 09/28/2016   Procedure: TRANSESOPHAGEAL ECHOCARDIOGRAM (TEE);  Surgeon: Prescott Gum, Collier Salina, MD;  Location: [place];  Service: Open Heart Surgery;  Laterality: N/A;  . ULTRASOUND GUIDANCE FOR VASCULAR ACCESS  02/11/2017   Procedure: Ultrasound Guidance For Vascular Access;  Surgeon: Jettie Booze, MD;  Location: [place] [facility];  Service: Cardiovascular;;    Current Medications: Current Meds  Medication Sig  . aspirin 81 MG EC tablet Take 1 tablet (81 mg total) by mouth daily.  . Cyanocobalamin (VITAMIN B-12 ER PO) Take 1 tablet by mouth daily. Unknown strength  . furosemide (LASIX) 40 MG tablet TAKE 1 TABLET BY MOUTH EVERY DAY (Patient taking differently: Take 40 mg by mouth daily. TAKE 1 TABLET BY MOUTH EVERY DAY)  . isosorbide mononitrate (IMDUR) 60 MG 24 hr tablet TAKE 3 TABLETS (180 MG TOTAL) BY MOUTH DAILY. (Patient taking differently: Take 240 mg by mouth daily.)  . metoprolol tartrate (LOPRESSOR) 25 MG tablet Take 0.5 tablets (12.5 mg total) by mouth 2 (two) times daily.  . nitroGLYCERIN (NITROSTAT) 0.4 MG SL tablet Place 0.4 mg under the tongue every 5 (five) minutes as needed for chest pain.   . pantoprazole (PROTONIX) 40 MG tablet Take 40 mg by mouth in the morning and at bedtime.   . potassium chloride (KLOR-CON) 10 MEQ tablet TAKE 1 TABLET BY MOUTH EVERY DAY (Patient taking differently: Take 10 mEq by mouth daily.)  . ranolazine (RANEXA) 1000 MG SR tablet TAKE 1  BY MOUTH TWICE DAILY (Patient taking differently: Take 1,000 mg by mouth 2 (two) times daily.)  . rosuvastatin (CRESTOR) 20 MG tablet Take 1 tablet (20 mg total) by mouth daily at 6 PM.  . XARELTO 2.5 MG TABS tablet TAKE 1 TABLET BY MOUTH TWICE A DAY (Patient taking differently: Take 2.5 mg by mouth 2 (two) times daily.)     Allergies:   Benadryl  [diphenhydramine hcl (sleep)] and Levofloxacin   Social History   Socioeconomic History  . Marital status: Married    Spouse name: Not on file  . Number of children: Not on file  . Years of education: Not on file  . Highest education level: Not on file  Occupational History  . Occupation: Retired  Tobacco Use  . Smoking status: Never Smoker  . Smokeless tobacco: Never Used  Vaping Use  . Vaping Use: Never used  Substance and Sexual Activity  . Alcohol use: No  . Drug use: No  . Sexual activity: Yes    Birth control/protection: None  Other Topics Concern  . Not on file  Social History Narrative  . Not on file   Social Determinants of Health   Financial Resource Strain: Not on file  Food Insecurity: Not on file  Transportation Needs: Not on file  Physical Activity: Not on file  Stress: Not  on file  Social Connections: Not on file     Family History: The patient's family history includes Heart attack in his father; Heart failure in his mother. ROS:   Please see the history of present illness.    All 14 point review of systems negative except as described per history of present illness  EKGs/Labs/Other Studies Reviewed:      Recent Labs: 02/25/2020: ALT 24; B Natriuretic Peptide 102.1; Hemoglobin 13.7; Platelets 161; TSH 1.771 02/26/2020: Magnesium 2.1 03/25/2020: BUN 18; Creatinine, Ser 1.45; NT-Pro BNP 153; Potassium 4.3; Sodium 141  Recent Lipid Panel    Component Value Date/Time   CHOL 101 02/27/2020 0343   CHOL 96 (L) 09/14/2017 0840   TRIG 97 02/27/2020 0343   HDL 36 (L) 02/27/2020 0343   HDL 42 09/14/2017 0840   CHOLHDL 2.8 02/27/2020 0343   VLDL 19 02/27/2020 0343   LDLCALC 46 02/27/2020 0343   LDLCALC 32 09/14/2017 0840    Physical Exam:    VS:  BP 138/80 (BP Location: Right Arm, Patient Position: Sitting)   Pulse (!) 59   Ht 5\' 6"  (1.676 m)   Wt 206 lb (93.4 kg)   SpO2 95%   BMI 33.25 kg/m     Wt Readings from Last 3 Encounters:  08/05/20  206 lb (93.4 kg)  04/26/20 210 lb (95.3 kg)  03/25/20 209 lb (94.8 kg)     GEN:  Well nourished, well developed in no acute distress HEENT: Normal NECK: No JVD; No carotid bruits LYMPHATICS: No lymphadenopathy CARDIAC: RRR, no murmurs, no rubs, no gallops RESPIRATORY:  Clear to auscultation without rales, wheezing or rhonchi  ABDOMEN: Soft, non-tender, non-distended MUSCULOSKELETAL:  No edema; No deformity  SKIN: Warm and dry LOWER EXTREMITIES: no swelling NEUROLOGIC:  Alert and oriented x 3 PSYCHIATRIC:  Normal affect   ASSESSMENT:    1. Hx of CABG   2. S/P CABG x 4   3. Mixed hyperlipidemia   4. Ischemic cardiomyopathy   5. Primary hypertension    PLAN:    In order of problems listed above:  1. History of coronary artery bypass graft with occluded 3 out of 4 grafts.  Medical therapy only.  Seems to be responding quite nicely at this time to medical therapy which I will continue. 2. Mixed dyslipidemia he is on Crestor 20 which is high intensity statin which I will continue.  He is fasting lipid profile showed LDL 46 HDL 36.  We will continue present management. 3. Paroxysmal atrial fibrillation maintaining sinus rhythm denies have any palpitation he is anticoagulated with Xarelto 2.5 mg dose of vascular/CAD dose which I will continue.  No recent recurrences of atrial fibrillation.  The only time he had atrial fibrillation was after bypass surgery. 4. We had a long discussion today about the future.  His wife actually brought in a shoe that he is bathing himself that he is not able to do what he was able to do before.  I did offer him some psychological support.  Bensen said that he would like to think about it and he will let me know if he decides to proceed that way I might appear it would be very beneficial to him. 5. I did review records from hospitalization for this visit.   Medication Adjustments/Labs and Tests Ordered: Current medicines are reviewed at length with the  patient today.  Concerns regarding medicines are outlined above.  No orders of the defined types were placed in this encounter.  Medication changes: No orders of the defined types were placed in this encounter.   Signed, Park Liter, MD, [facility] 08/05/2020 9:55 AM    [place]

## 2020-08-09 ENCOUNTER — Other Ambulatory Visit: Payer: Self-pay | Admitting: Cardiology

## 2020-08-09 NOTE — Telephone Encounter (Signed)
Prescription refill request for Xarelto received.  Indication: atrial fib Last office visit:4/22  krasowski Weight:93.4 kg Age:75 Scr:1.45  12/21 CrCl: 63.01 ml/min  Prescription refilled

## 2020-08-22 ENCOUNTER — Ambulatory Visit (INDEPENDENT_AMBULATORY_CARE_PROVIDER_SITE_OTHER): Payer: Medicare Other

## 2020-08-22 DIAGNOSIS — I255 Ischemic cardiomyopathy: Secondary | ICD-10-CM | POA: Diagnosis not present

## 2020-08-26 LAB — CUP PACEART REMOTE DEVICE CHECK
Date Time Interrogation Session: 20220506112134
Implantable Pulse Generator Implant Date: 20210610

## 2020-09-01 ENCOUNTER — Other Ambulatory Visit: Payer: Self-pay | Admitting: Cardiology

## 2020-09-12 NOTE — Progress Notes (Signed)
Carelink Summary Report / Loop Recorder 

## 2020-09-21 ENCOUNTER — Other Ambulatory Visit: Payer: Self-pay | Admitting: Cardiology

## 2020-09-23 ENCOUNTER — Ambulatory Visit (INDEPENDENT_AMBULATORY_CARE_PROVIDER_SITE_OTHER): Payer: Medicare Other

## 2020-09-23 DIAGNOSIS — I255 Ischemic cardiomyopathy: Secondary | ICD-10-CM

## 2020-09-25 LAB — CUP PACEART REMOTE DEVICE CHECK
Date Time Interrogation Session: 20220608112454
Implantable Pulse Generator Implant Date: 20210610

## 2020-10-09 ENCOUNTER — Other Ambulatory Visit: Payer: Self-pay | Admitting: Cardiology

## 2020-10-09 ENCOUNTER — Telehealth: Payer: Self-pay | Admitting: Cardiology

## 2020-10-09 MED ORDER — NITROGLYCERIN 0.4 MG SL SUBL: 0.4000 mg | SUBLINGUAL_TABLET | SUBLINGUAL | 11 refills | Status: DC | PRN

## 2020-10-09 NOTE — Telephone Encounter (Signed)
Medication filled.  

## 2020-10-09 NOTE — Telephone Encounter (Signed)
*  STAT* If patient is at the pharmacy, call can be transferred to refill team.   1. Which medications need to be refilled? (please list name of each medication and dose if known) nitroGLYCERIN (NITROSTAT) 0.4 MG SL tablet  2. Which pharmacy/location (including street and city if local pharmacy) is medication to be sent to?  [id number] [place], [place] [facility] [address]  3. Do they need a 30 day or 90 day supply? [place]

## 2020-10-12 ENCOUNTER — Other Ambulatory Visit: Payer: Self-pay | Admitting: Cardiology

## 2020-10-14 NOTE — Telephone Encounter (Signed)
Rx approved and sent 

## 2020-10-14 NOTE — Telephone Encounter (Signed)
Pharmacy notified patient stopped Imdur back on 07/2020.

## 2020-10-15 NOTE — Progress Notes (Signed)
Carelink Summary Report / Loop Recorder 

## 2020-10-24 ENCOUNTER — Ambulatory Visit (INDEPENDENT_AMBULATORY_CARE_PROVIDER_SITE_OTHER): Payer: Medicare Other

## 2020-10-24 ENCOUNTER — Other Ambulatory Visit: Payer: Self-pay | Admitting: Cardiology

## 2020-10-24 DIAGNOSIS — I255 Ischemic cardiomyopathy: Secondary | ICD-10-CM

## 2020-10-25 ENCOUNTER — Encounter: Payer: Self-pay | Admitting: Cardiology

## 2020-10-25 ENCOUNTER — Other Ambulatory Visit: Payer: Self-pay

## 2020-10-25 ENCOUNTER — Ambulatory Visit (INDEPENDENT_AMBULATORY_CARE_PROVIDER_SITE_OTHER): Payer: Medicare Other | Admitting: Cardiology

## 2020-10-25 VITALS — BP 148/82 | HR 61 | Ht 66.0 in | Wt 208.8 lb

## 2020-10-25 DIAGNOSIS — I5032 Chronic diastolic (congestive) heart failure: Secondary | ICD-10-CM | POA: Diagnosis not present

## 2020-10-25 DIAGNOSIS — E782 Mixed hyperlipidemia: Secondary | ICD-10-CM

## 2020-10-25 DIAGNOSIS — R0602 Shortness of breath: Secondary | ICD-10-CM

## 2020-10-25 DIAGNOSIS — Z951 Presence of aortocoronary bypass graft: Secondary | ICD-10-CM | POA: Diagnosis not present

## 2020-10-25 DIAGNOSIS — I255 Ischemic cardiomyopathy: Secondary | ICD-10-CM | POA: Diagnosis not present

## 2020-10-25 DIAGNOSIS — I10 Essential (primary) hypertension: Secondary | ICD-10-CM

## 2020-10-25 NOTE — Addendum Note (Signed)
Addended by: Truddie Hidden on: 10/25/2020 09:40 AM   Modules accepted: Orders

## 2020-10-25 NOTE — Patient Instructions (Addendum)
Medication Instructions:  Your physician recommends that you continue on your current medications as directed. Please refer to the Current Medication list given to you today.  *If you need a refill on your cardiac medications before your next appointment, please call your pharmacy*   Lab Work: Your physician recommends that you have a BMET and ProBNP today in the office.  If you have labs (blood work) drawn today and your tests are completely normal, you will receive your results only by: [place] (if you have MyChart) OR A paper copy in the mail If you have any lab test that is abnormal or we need to change your treatment, we will call you to review the results.   Testing/Procedures: None ordered   Follow-Up: At [facility], you and your health needs are our priority.  As part of our continuing mission to provide you with exceptional heart care, we have created designated Provider Care Teams.  These Care Teams include your primary Cardiologist (physician) and Advanced Practice Providers (APPs -  Physician Assistants and Nurse Practitioners) who all work together to provide you with the care you need, when you need it.  We recommend signing up for the patient portal called "MyChart".  Sign up information is provided on this After Visit Summary.  MyChart is used to connect with patients for Virtual Visits (Telemedicine).  Patients are able to view lab/test results, encounter notes, upcoming appointments, etc.  Non-urgent messages can be sent to your provider as well.   To learn more about what you can do with MyChart, go to [email or web address].    Your next appointment:   5 month(s)  The format for your next appointment:   In Person  Provider:   Jenne Campus, MD   Other Instructions NA

## 2020-10-25 NOTE — Progress Notes (Signed)
Cardiology Office Note:    Date:  10/25/2020   ID:  Ernest Booker, DOB 1946/04/07, MRN [id number]  PCP:  [name], MD  Cardiologist:  Jenne Campus, MD    Referring MD: [name], MD   Chief Complaint  Patient presents with   Follow-up  Doing fair, I do have bad days and good days  History of Present Illness:    Ernest Booker is a 75 y.o. male with past medical history significant for coronary artery bypass graft which was done in 2018, with LIMA to LAD SVG to diagonal SVG to ramus and SVG to PDA.  Then cardiac catheterization done 6 months later showed occlusion of all venous grafts except LIMA to LAD being open.  Since that time he been struggling with episode of angina.  He did have cardiac catheterization in 2021 November which basically showed occluded grafts and no target lesions for intervention.  He also got history of essential hypertension, dyslipidemia.  Comes today to my office for follow-up.  Overall seems to be doing fine.  Still described to have some episode of chest pain.  He said he got bad days and good days.  In bad days he is not able to do anything he is just tired exhausted sitting in the chair but then a good day he is trying to do a lot and he is doing quite well.  His passion is fishing but he did not finish for long time.    Past Medical History:  Diagnosis Date   Acute on chronic diastolic CHF (congestive heart failure) (HCC) 02/25/2020   Angina pectoris (H[facility])    Anginal pain (H[facility])    Arthritis    Atrial fibrillation ([place]) 09/15/2017   Bilateral numbness and tingling of arms and legs 10/29/2015   CAD (coronary artery disease) 09/28/2016   Cardiogenic shock (HCC)    Cervical disc disease 10/29/2015   Chest pain at rest 09/15/2017   Chronic diastolic CHF (congestive heart failure) ([place]) 02/25/2020   Chronic kidney disease (CKD), stage III (moderate) ([place]) 09/15/2017   Coronary artery disease    Edema    Encounter for therapeutic drug monitoring  10/14/2016   History of TIA (transient ischemic attack) 07/27/2019   Hx of CABG    Hyperlipemia 09/24/2016   Hyperlipidemia    Hyperlipidemia LDL goal <70    Hypertension    Ischemic cardiomyopathy 10/22/2016   Myocardial infarction [facility])    Neck pain 10/29/2015   Non-ST elevation (NSTEMI) myocardial infarction [facility])    NSTEMI (non-ST elevated myocardial infarction) ([place]) 09/24/2016   Numbness in left leg 10/29/2015   Paroxysmal tachycardia (HCC)    Pleural effusion    Presence of aortocoronary bypass graft 10/08/2016   LIMA to LAD -SVG to DIAG SVG to RAMUS SVG to PDA  All grafts occluded except LIMA based on cardiac catheterization 07/2016   Respiratory failure (HCC)    S/P CABG x 4 10/08/2016   LIMA to LAD -SVG to DIAG SVG to RAMUS SVG to PDA  All grafts occluded except LIMA based on cardiac catheterization 07/2016   Shortness of breath    Weakness 10/29/2015   Weakness of both arms 10/29/2015    Past Surgical History:  Procedure Laterality Date   CORONARY ARTERY BYPASS GRAFT N/A 09/28/2016   Procedure: CORONARY ARTERY BYPASS GRAFTING (CABG) x four, using left internal mammary artery and right leg greater saphenous vein harvested endoscopically;  Surgeon: Ivin Poot, MD;  Location: [place];  Service: Open Heart  Surgery;  Laterality: N/A;   IR THORACENTESIS ASP PLEURAL SPACE W/IMG GUIDE  10/06/2016   LEFT HEART CATH AND CORONARY ANGIOGRAPHY N/A 09/24/2016   Procedure: Left Heart Cath and Coronary Angiography;  Surgeon: Lorretta Harp, MD;  Location: [place] [facility];  Service: Cardiovascular;  Laterality: N/A;   LEFT HEART CATH AND CORS/GRAFTS ANGIOGRAPHY N/A 02/11/2017   Procedure: LEFT HEART CATH AND CORS/GRAFTS ANGIOGRAPHY;  Surgeon: Jettie Booze, MD;  Location: [place] [facility];  Service: Cardiovascular;  Laterality: N/A;   LEFT HEART CATH AND CORS/GRAFTS ANGIOGRAPHY N/A 02/27/2020   Procedure: LEFT HEART CATH AND CORS/GRAFTS ANGIOGRAPHY;  Surgeon: Martinique, Peter M, MD;  Location:  [place] [facility];  Service: Cardiovascular;  Laterality: N/A;   TEE WITHOUT CARDIOVERSION N/A 09/28/2016   Procedure: TRANSESOPHAGEAL ECHOCARDIOGRAM (TEE);  Surgeon: Prescott Gum, Collier Salina, MD;  Location: [place];  Service: Open Heart Surgery;  Laterality: N/A;   ULTRASOUND GUIDANCE FOR VASCULAR ACCESS  02/11/2017   Procedure: Ultrasound Guidance For Vascular Access;  Surgeon: Jettie Booze, MD;  Location: [place] [facility];  Service: Cardiovascular;;    Current Medications: Current Meds  Medication Sig   aspirin 81 MG EC tablet Take 1 tablet (81 mg total) by mouth daily.   Cyanocobalamin (VITAMIN B-12 ER PO) Take 1 tablet by mouth daily. Unknown strength   furosemide (LASIX) 40 MG tablet TAKE 1 TABLET BY MOUTH EVERY DAY (Patient taking differently: Take 40 mg by mouth daily. TAKE 1 TABLET BY MOUTH EVERY DAY)   isosorbide mononitrate (IMDUR) 60 MG 24 hr tablet TAKE 4 TABLETS (240 MG TOTAL) BY MOUTH DAILY.   metoprolol tartrate (LOPRESSOR) 25 MG tablet Take 0.5 tablets (12.5 mg total) by mouth 2 (two) times daily.   nitroGLYCERIN (NITROSTAT) 0.4 MG SL tablet Place 1 tablet (0.4 mg total) under the tongue every 5 (five) minutes as needed for chest pain.   pantoprazole (PROTONIX) 40 MG tablet Take 40 mg by mouth in the morning and at bedtime.    potassium chloride (KLOR-CON) 10 MEQ tablet TAKE 1 TABLET BY MOUTH EVERY DAY (Patient taking differently: Take 10 mEq by mouth daily.)   ranolazine (RANEXA) 1000 MG SR tablet TAKE 1  BY MOUTH TWICE DAILY (Patient taking differently: Take 1,000 mg by mouth 2 (two) times daily.)   rosuvastatin (CRESTOR) 20 MG tablet TAKE 1 TABLET BY MOUTH ONCE DAILY AT 6 PM (Patient taking differently: Take 20 mg by mouth daily.)   XARELTO 2.5 MG TABS tablet TAKE 1 TABLET BY MOUTH TWICE A DAY (Patient taking differently: Take 2.5 mg by mouth 2 (two) times daily.)     Allergies:   Benadryl [diphenhydramine hcl (sleep)] and Levofloxacin   Social History   Socioeconomic  History   Marital status: Married    Spouse name: Not on file   Number of children: Not on file   Years of education: Not on file   Highest education level: Not on file  Occupational History   Occupation: Retired  Tobacco Use   Smoking status: Never   Smokeless tobacco: Never  Vaping Use   Vaping Use: Never used  Substance and Sexual Activity   Alcohol use: No   Drug use: No   Sexual activity: Yes    Birth control/protection: None  Other Topics Concern   Not on file  Social History Narrative   Not on file   Social Determinants of Health   Financial Resource Strain: Not on file  Food Insecurity: Not on  file  Transportation Needs: Not on file  Physical Activity: Not on file  Stress: Not on file  Social Connections: Not on file     Family History: The patient's family history includes Heart attack in his father; Heart failure in his mother. ROS:   Please see the history of present illness.    All 14 point review of systems negative except as described per history of present illness  EKGs/Labs/Other Studies Reviewed:      Recent Labs: 02/25/2020: ALT 24; B Natriuretic Peptide 102.1; Hemoglobin 13.7; Platelets 161; TSH 1.771 02/26/2020: Magnesium 2.1 03/25/2020: BUN 18; Creatinine, Ser 1.45; NT-Pro BNP 153; Potassium 4.3; Sodium 141  Recent Lipid Panel    Component Value Date/Time   CHOL 101 02/27/2020 0343   CHOL 96 (L) 09/14/2017 0840   TRIG 97 02/27/2020 0343   HDL 36 (L) 02/27/2020 0343   HDL 42 09/14/2017 0840   CHOLHDL 2.8 02/27/2020 0343   VLDL 19 02/27/2020 0343   LDLCALC 46 02/27/2020 0343   LDLCALC 32 09/14/2017 0840    Physical Exam:    VS:  BP (!) 148/82 (BP Location: Right Arm)   Pulse 61   Ht 5\' 6"  (1.676 m)   Wt 208 lb 12.8 oz (94.7 kg)   SpO2 96%   BMI 33.70 kg/m     Wt Readings from Last 3 Encounters:  10/25/20 208 lb 12.8 oz (94.7 kg)  08/05/20 206 lb (93.4 kg)  04/26/20 210 lb (95.3 kg)     GEN:  Well nourished, well developed in  no acute distress HEENT: Normal NECK: No JVD; No carotid bruits LYMPHATICS: No lymphadenopathy CARDIAC: RRR, no murmurs, no rubs, no gallops RESPIRATORY:  Clear to auscultation without rales, wheezing or rhonchi  ABDOMEN: Soft, non-tender, non-distended MUSCULOSKELETAL:  No edema; No deformity  SKIN: Warm and dry LOWER EXTREMITIES: no swelling NEUROLOGIC:  Alert and oriented x 3 PSYCHIATRIC:  Normal affect   ASSESSMENT:    1. S/P CABG x 4   2. Chronic diastolic CHF (congestive heart failure) ([place])   3. Ischemic cardiomyopathy   4. Primary hypertension   5. Mixed hyperlipidemia   6. Shortness of breath    PLAN:    In order of problems listed above:  Coronary disease status post coronary bypass graft in 2018 with occlusion of 3 out of 4 grafts.  Medical therapy.  He seems to be doing quite well issue that he got today is the fact he does have some swelling of lower extremities as well as increased belly size.  I will do Chem-7 as well as proBNP.  I am somewhat reluctant to give him more diuretics since he described also episodes of unsteady gait and dizziness as he did fell down 3 times. Chronic diastolic congestive heart failure and proBNP will be checked to see if there is any room for more diuresis. Essential hypertension uncontrolled today but he said he just took his medication.  I asked him to check his blood pressure on the regular basis. Dyslipidemia he is on high intensity statin which I will continue, he is cholesterol I have from November of last year which is LDL 46 a HDL 36.  We will check his cholesterol today. Shortness of breath: Stable.  Continue present management.   Medication Adjustments/Labs and Tests Ordered: Current medicines are reviewed at length with the patient today.  Concerns regarding medicines are outlined above.  No orders of the defined types were placed in this encounter.  Medication changes: No  orders of the defined types were placed in this  encounter.   Signed, Park Liter, MD, [facility] 10/25/2020 9:26 AM    [place]

## 2020-10-26 LAB — LIPID PANEL
Chol/HDL Ratio: 2.4 ratio (ref 0.0–5.0)
Cholesterol, Total: 125 mg/dL (ref 100–199)
HDL: 53 mg/dL (ref >39)
LDL Chol Calc (NIH): 52 mg/dL (ref 0–99)
Triglycerides: 111 mg/dL (ref 0–149)
VLDL Cholesterol Cal: 20 mg/dL (ref 5–40)

## 2020-10-26 LAB — PRO B NATRIURETIC PEPTIDE: NT-Pro BNP: 206 pg/mL (ref 0–376)

## 2020-10-28 LAB — CUP PACEART REMOTE DEVICE CHECK
Date Time Interrogation Session: 20220711112641
Implantable Pulse Generator Implant Date: 20210610

## 2020-11-06 ENCOUNTER — Other Ambulatory Visit: Payer: Self-pay | Admitting: Cardiology

## 2020-11-09 ENCOUNTER — Other Ambulatory Visit: Payer: Self-pay | Admitting: Cardiology

## 2020-11-15 NOTE — Progress Notes (Signed)
Carelink Summary Report / Loop Recorder 

## 2020-11-25 ENCOUNTER — Ambulatory Visit (INDEPENDENT_AMBULATORY_CARE_PROVIDER_SITE_OTHER): Payer: Medicare Other

## 2020-11-25 DIAGNOSIS — I255 Ischemic cardiomyopathy: Secondary | ICD-10-CM | POA: Diagnosis not present

## 2020-11-29 DIAGNOSIS — F331 Major depressive disorder, recurrent, moderate: Secondary | ICD-10-CM | POA: Diagnosis not present

## 2020-11-29 DIAGNOSIS — Z7189 Other specified counseling: Secondary | ICD-10-CM | POA: Diagnosis not present

## 2020-11-29 DIAGNOSIS — Z1331 Encounter for screening for depression: Secondary | ICD-10-CM | POA: Diagnosis not present

## 2020-11-29 DIAGNOSIS — Z125 Encounter for screening for malignant neoplasm of prostate: Secondary | ICD-10-CM | POA: Diagnosis not present

## 2020-11-29 DIAGNOSIS — Z23 Encounter for immunization: Secondary | ICD-10-CM | POA: Diagnosis not present

## 2020-11-29 DIAGNOSIS — Z131 Encounter for screening for diabetes mellitus: Secondary | ICD-10-CM | POA: Diagnosis not present

## 2020-11-29 DIAGNOSIS — Z0001 Encounter for general adult medical examination with abnormal findings: Secondary | ICD-10-CM | POA: Diagnosis not present

## 2020-11-29 DIAGNOSIS — Z1339 Encounter for screening examination for other mental health and behavioral disorders: Secondary | ICD-10-CM | POA: Diagnosis not present

## 2020-12-02 LAB — CUP PACEART REMOTE DEVICE CHECK
Date Time Interrogation Session: 20220813112754
Implantable Pulse Generator Implant Date: 20210610

## 2020-12-04 DIAGNOSIS — M25511 Pain in right shoulder: Secondary | ICD-10-CM | POA: Diagnosis not present

## 2020-12-04 DIAGNOSIS — M19011 Primary osteoarthritis, right shoulder: Secondary | ICD-10-CM | POA: Diagnosis not present

## 2020-12-04 DIAGNOSIS — G8929 Other chronic pain: Secondary | ICD-10-CM | POA: Diagnosis not present

## 2020-12-09 DIAGNOSIS — C44612 Basal cell carcinoma of skin of right upper limb, including shoulder: Secondary | ICD-10-CM | POA: Diagnosis not present

## 2020-12-09 DIAGNOSIS — L570 Actinic keratosis: Secondary | ICD-10-CM | POA: Diagnosis not present

## 2020-12-18 DIAGNOSIS — R7989 Other specified abnormal findings of blood chemistry: Secondary | ICD-10-CM | POA: Diagnosis not present

## 2020-12-19 NOTE — Progress Notes (Signed)
Carelink Summary Report / Loop Recorder 

## 2020-12-24 DIAGNOSIS — H26493 Other secondary cataract, bilateral: Secondary | ICD-10-CM | POA: Diagnosis not present

## 2020-12-26 ENCOUNTER — Ambulatory Visit: Payer: Medicare Other

## 2020-12-26 ENCOUNTER — Other Ambulatory Visit: Payer: Self-pay | Admitting: Cardiology

## 2020-12-26 NOTE — Telephone Encounter (Signed)
Isosorbide mononitrate 60 mg tablet # 120 x 3 refills sent to    [id number] [place] [place] - [id number]

## 2020-12-27 DIAGNOSIS — N19 Unspecified kidney failure: Secondary | ICD-10-CM | POA: Diagnosis not present

## 2021-01-03 DIAGNOSIS — R972 Elevated prostate specific antigen [PSA]: Secondary | ICD-10-CM | POA: Diagnosis not present

## 2021-01-03 DIAGNOSIS — N401 Enlarged prostate with lower urinary tract symptoms: Secondary | ICD-10-CM | POA: Diagnosis not present

## 2021-01-03 LAB — CUP PACEART REMOTE DEVICE CHECK
Date Time Interrogation Session: 20220915113049
Implantable Pulse Generator Implant Date: 20210610

## 2021-01-08 ENCOUNTER — Telehealth: Payer: Self-pay

## 2021-01-08 NOTE — Telephone Encounter (Signed)
   [facility] Pre-operative Risk Assessment    Request for surgical clearance:  What type of surgery is being performed? Ultrasound Guided Prostate Biopsy   When is this surgery scheduled? TBD   What type of clearance is required (medical clearance vs. Pharmacy clearance to hold med vs. Both)? Both   Are there any medications that need to be held prior to surgery and how long?Xarelto requesting 7 days prior to procedure.    Practice name and name of physician performing surgery? Dr. Earney Navy at [facility]   What is your office phone number: [phone]    7.   What is your office fax number: [phone]  8.   Anesthesia type (None, local, MAC, general) ? Not specified   FYI: Dr. Ara Kussmaul office is requesting copies of recent labs, office notes, EKG, ECHO, Stress Test or any other recent test performed.    Basil Dess Lot [place] 01/08/2021, 4:46 PM  _________________________________________________________________   (provider comments below)

## 2021-01-08 NOTE — Telephone Encounter (Signed)
   Patient Name: Ernest Booker  DOB: [birth year] MRN: [id number]  Primary Cardiologist: Jenne Campus, MD  Chart reviewed as part of pre-operative protocol coverage: ultrasound guided prostate biopsy, needing to hold Xarelto for 7 days.  Patient appears to be on Xarelto 2.5mg  BID dose per primary cardiologist (not anticoagulation dose) therefore need sto go to MD. Atrial fibrillation also listed under [place] but referenced in 07/2020 note as only occurring after bypass surgery. Will route to Dr. Agustin Cree for input on holding this for 7 days prior to procedure, then patient will need call. Dr. Agustin Cree - - Please route response to P CV DIV PREOP (the pre-op pool). Thank you.  Charlie Pitter, PA-C 01/08/2021, 5:59 PM

## 2021-01-10 ENCOUNTER — Other Ambulatory Visit: Payer: Self-pay | Admitting: Cardiology

## 2021-01-13 NOTE — Telephone Encounter (Signed)
   Name: Ernest Booker  DOB: 05-Feb-1946  MRN: [id number]   Primary Cardiologist: Jenne Campus, MD  Chart reviewed as part of pre-operative protocol coverage. Patient was contacted 01/13/2021 in reference to pre-operative risk assessment for pending surgery as outlined below.  Ernest Booker was last seen 10/2020 by Dr. Drue Dun, chart reviewed. RCRI is >11% indicating patient is generally high risk based on comorbidities. However, biopsy is generally low risk procedure. I reached out to patient for update on how he is doing. The patient affirms he has been doing well without any new cardiac symptoms. Therefore, based on ACC/AHA guidelines, the patient would be at acceptable risk for the planned procedure without further cardiovascular testing. The patient was advised that if he develops new symptoms prior to surgery to contact our office to arrange for a follow-up visit, and he verbalized understanding.  Regarding Xarelto, Dr. Agustin Cree was contacted with request to hold for 7 days. He states, "Xarelto need to be held only for 48 hours not for 5 to 7 days.  The only indication for holding Xarelto 5 to 7 days in the situation that we are operating  on central nervous system but not for prostate biopsy.  48-hour should be enough."  I will route this recommendation to the requesting party via Epic fax function and remove from pre-op pool.   I will also route to callback pool to fulfill request for additional records below.  Please call with questions.  Charlie Pitter, PA-C 01/13/2021, 11:05 AM

## 2021-01-13 NOTE — Telephone Encounter (Signed)
I will have medical records dept fax over notes being requested to surgeon's office. I will fax clearance notes to surgeon's office.

## 2021-01-15 NOTE — Telephone Encounter (Signed)
Records sent via MyChart

## 2021-01-15 NOTE — Telephone Encounter (Signed)
Message sent back from our HIM Dept states the pt is not seen in the [facility] location. The pt is seen in our [place] office. I will send a message to the [place] office HIM Dept to please fax the records being requested for pre op clearance.

## 2021-01-27 DIAGNOSIS — N401 Enlarged prostate with lower urinary tract symptoms: Secondary | ICD-10-CM | POA: Diagnosis not present

## 2021-01-27 DIAGNOSIS — R972 Elevated prostate specific antigen [PSA]: Secondary | ICD-10-CM | POA: Diagnosis not present

## 2021-01-27 DIAGNOSIS — C61 Malignant neoplasm of prostate: Secondary | ICD-10-CM | POA: Diagnosis not present

## 2021-02-05 ENCOUNTER — Ambulatory Visit (INDEPENDENT_AMBULATORY_CARE_PROVIDER_SITE_OTHER): Payer: Medicare Other

## 2021-02-05 DIAGNOSIS — C61 Malignant neoplasm of prostate: Secondary | ICD-10-CM | POA: Diagnosis not present

## 2021-02-05 DIAGNOSIS — I255 Ischemic cardiomyopathy: Secondary | ICD-10-CM

## 2021-02-05 DIAGNOSIS — R972 Elevated prostate specific antigen [PSA]: Secondary | ICD-10-CM | POA: Diagnosis not present

## 2021-02-07 LAB — CUP PACEART REMOTE DEVICE CHECK
Date Time Interrogation Session: 20221018113237
Implantable Pulse Generator Implant Date: 20210610

## 2021-02-10 NOTE — Progress Notes (Signed)
Carelink Summary Report / Loop Recorder 

## 2021-02-11 ENCOUNTER — Other Ambulatory Visit (HOSPITAL_COMMUNITY): Payer: Self-pay | Admitting: Urology

## 2021-02-11 DIAGNOSIS — R972 Elevated prostate specific antigen [PSA]: Secondary | ICD-10-CM

## 2021-02-20 ENCOUNTER — Other Ambulatory Visit: Payer: Self-pay

## 2021-02-20 ENCOUNTER — Encounter (HOSPITAL_COMMUNITY)
Admission: RE | Admit: 2021-02-20 | Discharge: 2021-02-20 | Disposition: A | Discharge status: Home / Self Care | Payer: Medicare Other | Source: Ambulatory Visit | Attending: Urology | Admitting: Urology

## 2021-02-20 DIAGNOSIS — J9811 Atelectasis: Secondary | ICD-10-CM | POA: Diagnosis not present

## 2021-02-20 DIAGNOSIS — C61 Malignant neoplasm of prostate: Secondary | ICD-10-CM | POA: Diagnosis not present

## 2021-02-20 DIAGNOSIS — R972 Elevated prostate specific antigen [PSA]: Secondary | ICD-10-CM | POA: Diagnosis not present

## 2021-02-20 DIAGNOSIS — K5730 Diverticulosis of large intestine without perforation or abscess without bleeding: Secondary | ICD-10-CM | POA: Diagnosis not present

## 2021-02-20 DIAGNOSIS — I2510 Atherosclerotic heart disease of native coronary artery without angina pectoris: Secondary | ICD-10-CM | POA: Diagnosis not present

## 2021-02-26 DIAGNOSIS — C61 Malignant neoplasm of prostate: Secondary | ICD-10-CM | POA: Diagnosis not present

## 2021-03-05 DIAGNOSIS — Z23 Encounter for immunization: Secondary | ICD-10-CM | POA: Diagnosis not present

## 2021-03-07 DIAGNOSIS — M25562 Pain in left knee: Secondary | ICD-10-CM | POA: Diagnosis not present

## 2021-03-10 ENCOUNTER — Ambulatory Visit (INDEPENDENT_AMBULATORY_CARE_PROVIDER_SITE_OTHER): Payer: Medicare Other

## 2021-03-10 DIAGNOSIS — C61 Malignant neoplasm of prostate: Secondary | ICD-10-CM | POA: Diagnosis not present

## 2021-03-10 DIAGNOSIS — R972 Elevated prostate specific antigen [PSA]: Secondary | ICD-10-CM | POA: Diagnosis not present

## 2021-03-10 DIAGNOSIS — I639 Cerebral infarction, unspecified: Secondary | ICD-10-CM

## 2021-03-11 DIAGNOSIS — C61 Malignant neoplasm of prostate: Secondary | ICD-10-CM | POA: Diagnosis not present

## 2021-03-11 LAB — CUP PACEART REMOTE DEVICE CHECK
Date Time Interrogation Session: 20221120103611
Implantable Pulse Generator Implant Date: 20210610

## 2021-03-15 DIAGNOSIS — D72829 Elevated white blood cell count, unspecified: Secondary | ICD-10-CM | POA: Diagnosis not present

## 2021-03-15 DIAGNOSIS — I252 Old myocardial infarction: Secondary | ICD-10-CM | POA: Diagnosis not present

## 2021-03-15 DIAGNOSIS — I10 Essential (primary) hypertension: Secondary | ICD-10-CM | POA: Diagnosis not present

## 2021-03-15 DIAGNOSIS — Z951 Presence of aortocoronary bypass graft: Secondary | ICD-10-CM | POA: Diagnosis not present

## 2021-03-15 DIAGNOSIS — R531 Weakness: Secondary | ICD-10-CM | POA: Diagnosis not present

## 2021-03-15 DIAGNOSIS — L03311 Cellulitis of abdominal wall: Secondary | ICD-10-CM | POA: Diagnosis not present

## 2021-03-15 DIAGNOSIS — I2510 Atherosclerotic heart disease of native coronary artery without angina pectoris: Secondary | ICD-10-CM | POA: Diagnosis not present

## 2021-03-15 DIAGNOSIS — R079 Chest pain, unspecified: Secondary | ICD-10-CM | POA: Diagnosis not present

## 2021-03-15 DIAGNOSIS — Z20822 Contact with and (suspected) exposure to covid-19: Secondary | ICD-10-CM | POA: Diagnosis not present

## 2021-03-15 DIAGNOSIS — K219 Gastro-esophageal reflux disease without esophagitis: Secondary | ICD-10-CM | POA: Diagnosis not present

## 2021-03-15 DIAGNOSIS — I517 Cardiomegaly: Secondary | ICD-10-CM | POA: Diagnosis not present

## 2021-03-15 DIAGNOSIS — C61 Malignant neoplasm of prostate: Secondary | ICD-10-CM | POA: Diagnosis not present

## 2021-03-15 DIAGNOSIS — Z7982 Long term (current) use of aspirin: Secondary | ICD-10-CM | POA: Diagnosis not present

## 2021-03-15 DIAGNOSIS — Z79899 Other long term (current) drug therapy: Secondary | ICD-10-CM | POA: Diagnosis not present

## 2021-03-15 DIAGNOSIS — Z8673 Personal history of transient ischemic attack (TIA), and cerebral infarction without residual deficits: Secondary | ICD-10-CM | POA: Diagnosis not present

## 2021-03-15 DIAGNOSIS — R0789 Other chest pain: Secondary | ICD-10-CM | POA: Diagnosis not present

## 2021-03-15 DIAGNOSIS — N179 Acute kidney failure, unspecified: Secondary | ICD-10-CM | POA: Diagnosis not present

## 2021-03-15 DIAGNOSIS — I4891 Unspecified atrial fibrillation: Secondary | ICD-10-CM | POA: Diagnosis not present

## 2021-03-16 DIAGNOSIS — Z79899 Other long term (current) drug therapy: Secondary | ICD-10-CM | POA: Diagnosis not present

## 2021-03-16 DIAGNOSIS — C61 Malignant neoplasm of prostate: Secondary | ICD-10-CM | POA: Diagnosis not present

## 2021-03-16 DIAGNOSIS — R531 Weakness: Secondary | ICD-10-CM | POA: Diagnosis not present

## 2021-03-19 DIAGNOSIS — Z510 Encounter for antineoplastic radiation therapy: Secondary | ICD-10-CM | POA: Diagnosis not present

## 2021-03-19 DIAGNOSIS — C61 Malignant neoplasm of prostate: Secondary | ICD-10-CM | POA: Diagnosis not present

## 2021-03-19 DIAGNOSIS — R972 Elevated prostate specific antigen [PSA]: Secondary | ICD-10-CM | POA: Diagnosis not present

## 2021-03-19 NOTE — Progress Notes (Signed)
Carelink Summary Report / Loop Recorder 

## 2021-03-27 ENCOUNTER — Ambulatory Visit (INDEPENDENT_AMBULATORY_CARE_PROVIDER_SITE_OTHER): Payer: Medicare Other | Admitting: Cardiology

## 2021-03-27 ENCOUNTER — Other Ambulatory Visit: Payer: Self-pay

## 2021-03-27 ENCOUNTER — Encounter: Payer: Self-pay | Admitting: Cardiology

## 2021-03-27 VITALS — BP 122/78 | HR 62 | Ht 66.0 in | Wt 214.8 lb

## 2021-03-27 DIAGNOSIS — E785 Hyperlipidemia, unspecified: Secondary | ICD-10-CM

## 2021-03-27 DIAGNOSIS — Z510 Encounter for antineoplastic radiation therapy: Secondary | ICD-10-CM | POA: Diagnosis not present

## 2021-03-27 DIAGNOSIS — C61 Malignant neoplasm of prostate: Secondary | ICD-10-CM | POA: Diagnosis not present

## 2021-03-27 DIAGNOSIS — Z951 Presence of aortocoronary bypass graft: Secondary | ICD-10-CM

## 2021-03-27 DIAGNOSIS — E782 Mixed hyperlipidemia: Secondary | ICD-10-CM

## 2021-03-27 DIAGNOSIS — Z8673 Personal history of transient ischemic attack (TIA), and cerebral infarction without residual deficits: Secondary | ICD-10-CM | POA: Diagnosis not present

## 2021-03-27 DIAGNOSIS — I209 Angina pectoris, unspecified: Secondary | ICD-10-CM | POA: Diagnosis not present

## 2021-03-27 DIAGNOSIS — Z20822 Contact with and (suspected) exposure to covid-19: Secondary | ICD-10-CM | POA: Diagnosis not present

## 2021-03-27 MED ORDER — NITROGLYCERIN 0.4 MG SL SUBL: 0.4000 mg | SUBLINGUAL_TABLET | SUBLINGUAL | 11 refills | Status: DC | PRN

## 2021-03-27 NOTE — Addendum Note (Signed)
Addended by: Senaida Ores on: 03/27/2021 09:46 AM   Modules accepted: Orders

## 2021-03-27 NOTE — Patient Instructions (Signed)

## 2021-03-27 NOTE — Progress Notes (Signed)
Cardiology Office Note:    Date:  03/27/2021   ID:  Ernest Booker, DOB 27-Nov-1945, MRN [id number]  PCP:  [name], MD  Cardiologist:  Jenne Campus, MD    Referring MD: [name], MD   Chief Complaint  Patient presents with   Follow-up  I have a prostate cancer which is a new diagnosis  History of Present Illness:    Ernest Booker is a 74 y.o. male  Ernest Booker is a 75 y.o. male with past medical history significant for coronary artery bypass graft which was done in 2018, with LIMA to LAD SVG to diagonal SVG to ramus and SVG to PDA.  Then cardiac catheterization done 6 months later showed occlusion of all venous grafts except LIMA to LAD being open.  Since that time he been struggling with episode of angina.  He did have cardiac catheterization in 2021 November which basically showed occluded grafts and no target lesions for intervention.  He also got history of essential hypertension, dyslipidemia..  Recently he was diagnosed to have a prostate cancer he initiated radiation therapy as well as hormonal therapy does have difficulty tolerating it complain of being weak tired exhausted does have some episodes of chest pain but not as frequent as before probably the reason for that is the fact that he is more sedentary because he does not feel well with the treatment for prostate cancer.  Luckily PET scan did not show any metastatic lesions.  Past Medical History:  Diagnosis Date   Acute on chronic diastolic CHF (congestive heart failure) (HCC) 02/25/2020   Angina pectoris (HCC)    Anginal pain (HCC)    Arthritis    Atrial fibrillation ([place]) 09/15/2017   Bilateral numbness and tingling of arms and legs 10/29/2015   CAD (coronary artery disease) 09/28/2016   Cardiogenic shock (HCC)    Cervical disc disease 10/29/2015   Chest pain at rest 09/15/2017   Chronic diastolic CHF (congestive heart failure) ([place]) 02/25/2020   Chronic kidney disease (CKD), stage III (moderate)  ([place]) 09/15/2017   Coronary artery disease    Edema    Encounter for therapeutic drug monitoring 10/14/2016   History of TIA (transient ischemic attack) 07/27/2019   Hx of CABG    Hyperlipemia 09/24/2016   Hyperlipidemia    Hyperlipidemia LDL goal <70    Hypertension    Ischemic cardiomyopathy 10/22/2016   Myocardial infarction [facility])    Neck pain 10/29/2015   Non-ST elevation (NSTEMI) myocardial infarction [facility])    NSTEMI (non-ST elevated myocardial infarction) ([place]) 09/24/2016   Numbness in left leg 10/29/2015   Paroxysmal tachycardia (HCC)    Pleural effusion    Presence of aortocoronary bypass graft 10/08/2016   LIMA to LAD -SVG to DIAG SVG to RAMUS SVG to PDA  All grafts occluded except LIMA based on cardiac catheterization 07/2016   Prostate cancer ([place])    Respiratory failure (HCC)    S/P CABG x 4 10/08/2016   LIMA to LAD -SVG to DIAG SVG to RAMUS SVG to PDA  All grafts occluded except LIMA based on cardiac catheterization 07/2016   Shortness of breath    Weakness 10/29/2015   Weakness of both arms 10/29/2015    Past Surgical History:  Procedure Laterality Date   CORONARY ARTERY BYPASS GRAFT N/A 09/28/2016   Procedure: CORONARY ARTERY BYPASS GRAFTING (CABG) x four, using left internal mammary artery and right leg greater saphenous vein harvested endoscopically;  Surgeon: Ivin Poot, MD;  Location: [place];  Service: Open Heart Surgery;  Laterality: N/A;   IR THORACENTESIS ASP PLEURAL SPACE W/IMG GUIDE  10/06/2016   LEFT HEART CATH AND CORONARY ANGIOGRAPHY N/A 09/24/2016   Procedure: Left Heart Cath and Coronary Angiography;  Surgeon: Lorretta Harp, MD;  Location: [place] CV LAB;  Service: Cardiovascular;  Laterality: N/A;   LEFT HEART CATH AND CORS/GRAFTS ANGIOGRAPHY N/A 02/11/2017   Procedure: LEFT HEART CATH AND CORS/GRAFTS ANGIOGRAPHY;  Surgeon: Jettie Booze, MD;  Location: [place] CV LAB;  Service: Cardiovascular;  Laterality: N/A;   LEFT HEART CATH  AND CORS/GRAFTS ANGIOGRAPHY N/A 02/27/2020   Procedure: LEFT HEART CATH AND CORS/GRAFTS ANGIOGRAPHY;  Surgeon: Martinique, Peter M, MD;  Location: [place] CV LAB;  Service: Cardiovascular;  Laterality: N/A;   TEE WITHOUT CARDIOVERSION N/A 09/28/2016   Procedure: TRANSESOPHAGEAL ECHOCARDIOGRAM (TEE);  Surgeon: Prescott Gum, Collier Salina, MD;  Location: [place];  Service: Open Heart Surgery;  Laterality: N/A;   ULTRASOUND GUIDANCE FOR VASCULAR ACCESS  02/11/2017   Procedure: Ultrasound Guidance For Vascular Access;  Surgeon: Jettie Booze, MD;  Location: [place] CV LAB;  Service: Cardiovascular;;    Current Medications: Current Meds  Medication Sig   aspirin 81 MG EC tablet Take 1 tablet (81 mg total) by mouth daily.   Cyanocobalamin (VITAMIN B-12 ER PO) Take 1 tablet by mouth daily. Unknown strength   furosemide (LASIX) 40 MG tablet TAKE 1 TABLET BY MOUTH EVERY DAY (Patient taking differently: Take 40 mg by mouth daily. TAKE 1 TABLET BY MOUTH EVERY DAY)   isosorbide mononitrate (IMDUR) 60 MG 24 hr tablet TAKE 4 TABLETS (240 MG TOTAL) BY MOUTH DAILY.   metoprolol tartrate (LOPRESSOR) 25 MG tablet Take 0.5 tablets (12.5 mg total) by mouth 2 (two) times daily.   nitroGLYCERIN (NITROSTAT) 0.4 MG SL tablet Place 1 tablet (0.4 mg total) under the tongue every 5 (five) minutes as needed for chest pain.   pantoprazole (PROTONIX) 40 MG tablet Take 40 mg by mouth in the morning and at bedtime.    potassium chloride (KLOR-CON) 10 MEQ tablet TAKE 1 TABLET BY MOUTH EVERY DAY (Patient taking differently: Take 10 mEq by mouth daily.)   ranolazine (RANEXA) 1000 MG SR tablet Take 1 tablet by mouth twice daily (Patient taking differently: Take 1,000 mg by mouth 2 (two) times daily.)   rosuvastatin (CRESTOR) 20 MG tablet TAKE 1 TABLET BY MOUTH ONCE DAILY AT 6 PM (Patient taking differently: Take 20 mg by mouth daily.)   XARELTO 2.5 MG TABS tablet TAKE 1 TABLET BY MOUTH TWICE A DAY (Patient taking differently: Take 2.5  mg by mouth 2 (two) times daily.)     Allergies:   Benadryl [diphenhydramine hcl (sleep)] and Levofloxacin   Social History   Socioeconomic History   Marital status: Married    Spouse name: Not on file   Number of children: Not on file   Years of education: Not on file   Highest education level: Not on file  Occupational History   Occupation: Retired  Tobacco Use   Smoking status: Never   Smokeless tobacco: Never  Vaping Use   Vaping Use: Never used  Substance and Sexual Activity   Alcohol use: No   Drug use: No   Sexual activity: Yes    Birth control/protection: None  Other Topics Concern   Not on file  Social History Narrative   Not on file   Social Determinants of Health   Financial Resource Strain: Not on file  Food  Insecurity: Not on file  Transportation Needs: Not on file  Physical Activity: Not on file  Stress: Not on file  Social Connections: Not on file     Family History: The patient's family history includes Heart attack in his father; Heart failure in his mother. ROS:   Please see the history of present illness.    All 14 point review of systems negative except as described per history of present illness  EKGs/Labs/Other Studies Reviewed:      Recent Labs: 10/25/2020: NT-Pro BNP 206  Recent Lipid Panel    Component Value Date/Time   CHOL 125 10/25/2020 0958   TRIG 111 10/25/2020 0958   HDL 53 10/25/2020 0958   CHOLHDL 2.4 10/25/2020 0958   CHOLHDL 2.8 02/27/2020 0343   VLDL 19 02/27/2020 0343   LDLCALC 52 10/25/2020 0958    Physical Exam:    VS:  BP 122/78 (BP Location: Right Arm, Patient Position: Sitting)   Pulse 62   Ht 5\' 6"  (1.676 m)   Wt 214 lb 12.8 oz (97.4 kg)   SpO2 98%   BMI 34.67 kg/m     Wt Readings from Last 3 Encounters:  03/27/21 214 lb 12.8 oz (97.4 kg)  10/25/20 208 lb 12.8 oz (94.7 kg)  08/05/20 206 lb (93.4 kg)     GEN:  Well nourished, well developed in no acute distress HEENT: Normal NECK: No JVD; No  carotid bruits LYMPHATICS: No lymphadenopathy CARDIAC: RRR, no murmurs, no rubs, no gallops RESPIRATORY:  Clear to auscultation without rales, wheezing or rhonchi  ABDOMEN: Soft, non-tender, non-distended MUSCULOSKELETAL:  No edema; No deformity  SKIN: Warm and dry LOWER EXTREMITIES: no swelling NEUROLOGIC:  Alert and oriented x 3 PSYCHIATRIC:  Normal affect   ASSESSMENT:    1. Hx of CABG   2. Hyperlipidemia LDL goal <70   3. History of TIA (transient ischemic attack)   4. Mixed hyperlipidemia   5. Angina pectoris ([place])   6. Prostate cancer [facility])    PLAN:    In order of problems listed above:  History of coronary artery bypass graft.  Stable from that point review does have stable angina pectoris.  We will continue risk factors modifications with antianginal therapy.  Overall he seems to be doing well from that point review. Hyperlipidemia I did review K PN which show me his LDL of 52 HDL 53 this is from summer of this year. History of TIA no more episodes.  He is on antiplatelet therapy.  He is also on low-dose of Xarelto Angina pectoris.  Rare episodes. Prostate CA is a new discovery.  He is on hormonal therapy as well as radiation.   Medication Adjustments/Labs and Tests Ordered: Current medicines are reviewed at length with the patient today.  Concerns regarding medicines are outlined above.  No orders of the defined types were placed in this encounter.  Medication changes: No orders of the defined types were placed in this encounter.   Signed, Park Liter, MD, [facility] 03/27/2021 9:24 AM    [place]

## 2021-03-28 DIAGNOSIS — R972 Elevated prostate specific antigen [PSA]: Secondary | ICD-10-CM | POA: Diagnosis not present

## 2021-03-28 DIAGNOSIS — C61 Malignant neoplasm of prostate: Secondary | ICD-10-CM | POA: Diagnosis not present

## 2021-03-28 DIAGNOSIS — Z510 Encounter for antineoplastic radiation therapy: Secondary | ICD-10-CM | POA: Diagnosis not present

## 2021-04-02 ENCOUNTER — Other Ambulatory Visit: Payer: Self-pay | Admitting: Cardiology

## 2021-04-02 DIAGNOSIS — C61 Malignant neoplasm of prostate: Secondary | ICD-10-CM | POA: Diagnosis not present

## 2021-04-02 DIAGNOSIS — Z510 Encounter for antineoplastic radiation therapy: Secondary | ICD-10-CM | POA: Diagnosis not present

## 2021-04-02 DIAGNOSIS — R972 Elevated prostate specific antigen [PSA]: Secondary | ICD-10-CM | POA: Diagnosis not present

## 2021-04-02 NOTE — Telephone Encounter (Signed)
Prescription refill request for Xarelto received.  Indication:Afib Last office visit:12/22 Weight:97.4 kg Age:75 Scr:1.4 CrCl:64.74 ml/min  Prescription refilled

## 2021-04-03 DIAGNOSIS — C61 Malignant neoplasm of prostate: Secondary | ICD-10-CM | POA: Diagnosis not present

## 2021-04-03 DIAGNOSIS — Z510 Encounter for antineoplastic radiation therapy: Secondary | ICD-10-CM | POA: Diagnosis not present

## 2021-04-03 DIAGNOSIS — R972 Elevated prostate specific antigen [PSA]: Secondary | ICD-10-CM | POA: Diagnosis not present

## 2021-04-04 DIAGNOSIS — R972 Elevated prostate specific antigen [PSA]: Secondary | ICD-10-CM | POA: Diagnosis not present

## 2021-04-04 DIAGNOSIS — Z510 Encounter for antineoplastic radiation therapy: Secondary | ICD-10-CM | POA: Diagnosis not present

## 2021-04-04 DIAGNOSIS — C61 Malignant neoplasm of prostate: Secondary | ICD-10-CM | POA: Diagnosis not present

## 2021-04-07 DIAGNOSIS — R972 Elevated prostate specific antigen [PSA]: Secondary | ICD-10-CM | POA: Diagnosis not present

## 2021-04-07 DIAGNOSIS — C61 Malignant neoplasm of prostate: Secondary | ICD-10-CM | POA: Diagnosis not present

## 2021-04-07 DIAGNOSIS — Z510 Encounter for antineoplastic radiation therapy: Secondary | ICD-10-CM | POA: Diagnosis not present

## 2021-04-08 DIAGNOSIS — C61 Malignant neoplasm of prostate: Secondary | ICD-10-CM | POA: Diagnosis not present

## 2021-04-08 DIAGNOSIS — R972 Elevated prostate specific antigen [PSA]: Secondary | ICD-10-CM | POA: Diagnosis not present

## 2021-04-08 DIAGNOSIS — Z510 Encounter for antineoplastic radiation therapy: Secondary | ICD-10-CM | POA: Diagnosis not present

## 2021-04-09 DIAGNOSIS — C61 Malignant neoplasm of prostate: Secondary | ICD-10-CM | POA: Diagnosis not present

## 2021-04-09 DIAGNOSIS — R972 Elevated prostate specific antigen [PSA]: Secondary | ICD-10-CM | POA: Diagnosis not present

## 2021-04-09 DIAGNOSIS — Z510 Encounter for antineoplastic radiation therapy: Secondary | ICD-10-CM | POA: Diagnosis not present

## 2021-04-10 DIAGNOSIS — R972 Elevated prostate specific antigen [PSA]: Secondary | ICD-10-CM | POA: Diagnosis not present

## 2021-04-10 DIAGNOSIS — C61 Malignant neoplasm of prostate: Secondary | ICD-10-CM | POA: Diagnosis not present

## 2021-04-10 DIAGNOSIS — Z510 Encounter for antineoplastic radiation therapy: Secondary | ICD-10-CM | POA: Diagnosis not present

## 2021-04-11 DIAGNOSIS — R972 Elevated prostate specific antigen [PSA]: Secondary | ICD-10-CM | POA: Diagnosis not present

## 2021-04-11 DIAGNOSIS — C61 Malignant neoplasm of prostate: Secondary | ICD-10-CM | POA: Diagnosis not present

## 2021-04-11 DIAGNOSIS — Z510 Encounter for antineoplastic radiation therapy: Secondary | ICD-10-CM | POA: Diagnosis not present

## 2021-04-14 LAB — CUP PACEART REMOTE DEVICE CHECK
Date Time Interrogation Session: 20221223103810
Implantable Pulse Generator Implant Date: 20210610

## 2021-04-15 DIAGNOSIS — C61 Malignant neoplasm of prostate: Secondary | ICD-10-CM | POA: Diagnosis not present

## 2021-04-15 DIAGNOSIS — R972 Elevated prostate specific antigen [PSA]: Secondary | ICD-10-CM | POA: Diagnosis not present

## 2021-04-15 DIAGNOSIS — Z510 Encounter for antineoplastic radiation therapy: Secondary | ICD-10-CM | POA: Diagnosis not present

## 2021-04-16 ENCOUNTER — Ambulatory Visit (INDEPENDENT_AMBULATORY_CARE_PROVIDER_SITE_OTHER): Payer: Medicare Other

## 2021-04-16 DIAGNOSIS — R972 Elevated prostate specific antigen [PSA]: Secondary | ICD-10-CM | POA: Diagnosis not present

## 2021-04-16 DIAGNOSIS — I639 Cerebral infarction, unspecified: Secondary | ICD-10-CM

## 2021-04-16 DIAGNOSIS — C61 Malignant neoplasm of prostate: Secondary | ICD-10-CM | POA: Diagnosis not present

## 2021-04-16 DIAGNOSIS — Z510 Encounter for antineoplastic radiation therapy: Secondary | ICD-10-CM | POA: Diagnosis not present

## 2021-04-17 DIAGNOSIS — R972 Elevated prostate specific antigen [PSA]: Secondary | ICD-10-CM | POA: Diagnosis not present

## 2021-04-17 DIAGNOSIS — C61 Malignant neoplasm of prostate: Secondary | ICD-10-CM | POA: Diagnosis not present

## 2021-04-17 DIAGNOSIS — Z510 Encounter for antineoplastic radiation therapy: Secondary | ICD-10-CM | POA: Diagnosis not present

## 2021-04-18 DIAGNOSIS — R972 Elevated prostate specific antigen [PSA]: Secondary | ICD-10-CM | POA: Diagnosis not present

## 2021-04-18 DIAGNOSIS — C61 Malignant neoplasm of prostate: Secondary | ICD-10-CM | POA: Diagnosis not present

## 2021-04-18 DIAGNOSIS — Z510 Encounter for antineoplastic radiation therapy: Secondary | ICD-10-CM | POA: Diagnosis not present

## 2021-04-20 DIAGNOSIS — R972 Elevated prostate specific antigen [PSA]: Secondary | ICD-10-CM | POA: Diagnosis not present

## 2021-04-20 DIAGNOSIS — C61 Malignant neoplasm of prostate: Secondary | ICD-10-CM | POA: Diagnosis not present

## 2021-04-22 DIAGNOSIS — Z510 Encounter for antineoplastic radiation therapy: Secondary | ICD-10-CM | POA: Diagnosis not present

## 2021-04-22 DIAGNOSIS — R972 Elevated prostate specific antigen [PSA]: Secondary | ICD-10-CM | POA: Diagnosis not present

## 2021-04-22 DIAGNOSIS — C61 Malignant neoplasm of prostate: Secondary | ICD-10-CM | POA: Diagnosis not present

## 2021-04-23 DIAGNOSIS — C61 Malignant neoplasm of prostate: Secondary | ICD-10-CM | POA: Diagnosis not present

## 2021-04-23 DIAGNOSIS — R972 Elevated prostate specific antigen [PSA]: Secondary | ICD-10-CM | POA: Diagnosis not present

## 2021-04-23 DIAGNOSIS — M25511 Pain in right shoulder: Secondary | ICD-10-CM | POA: Diagnosis not present

## 2021-04-23 DIAGNOSIS — G8929 Other chronic pain: Secondary | ICD-10-CM | POA: Diagnosis not present

## 2021-04-23 DIAGNOSIS — Z510 Encounter for antineoplastic radiation therapy: Secondary | ICD-10-CM | POA: Diagnosis not present

## 2021-04-24 DIAGNOSIS — Z510 Encounter for antineoplastic radiation therapy: Secondary | ICD-10-CM | POA: Diagnosis not present

## 2021-04-24 DIAGNOSIS — C61 Malignant neoplasm of prostate: Secondary | ICD-10-CM | POA: Diagnosis not present

## 2021-04-24 DIAGNOSIS — R972 Elevated prostate specific antigen [PSA]: Secondary | ICD-10-CM | POA: Diagnosis not present

## 2021-04-25 DIAGNOSIS — C61 Malignant neoplasm of prostate: Secondary | ICD-10-CM | POA: Diagnosis not present

## 2021-04-25 DIAGNOSIS — R972 Elevated prostate specific antigen [PSA]: Secondary | ICD-10-CM | POA: Diagnosis not present

## 2021-04-25 DIAGNOSIS — Z510 Encounter for antineoplastic radiation therapy: Secondary | ICD-10-CM | POA: Diagnosis not present

## 2021-04-28 DIAGNOSIS — Z510 Encounter for antineoplastic radiation therapy: Secondary | ICD-10-CM | POA: Diagnosis not present

## 2021-04-28 DIAGNOSIS — R972 Elevated prostate specific antigen [PSA]: Secondary | ICD-10-CM | POA: Diagnosis not present

## 2021-04-28 DIAGNOSIS — C61 Malignant neoplasm of prostate: Secondary | ICD-10-CM | POA: Diagnosis not present

## 2021-04-29 DIAGNOSIS — Z510 Encounter for antineoplastic radiation therapy: Secondary | ICD-10-CM | POA: Diagnosis not present

## 2021-04-29 DIAGNOSIS — R972 Elevated prostate specific antigen [PSA]: Secondary | ICD-10-CM | POA: Diagnosis not present

## 2021-04-29 DIAGNOSIS — C61 Malignant neoplasm of prostate: Secondary | ICD-10-CM | POA: Diagnosis not present

## 2021-04-29 NOTE — Progress Notes (Signed)
Carelink Summary Report / Loop Recorder 

## 2021-04-30 DIAGNOSIS — Z510 Encounter for antineoplastic radiation therapy: Secondary | ICD-10-CM | POA: Diagnosis not present

## 2021-04-30 DIAGNOSIS — C61 Malignant neoplasm of prostate: Secondary | ICD-10-CM | POA: Diagnosis not present

## 2021-04-30 DIAGNOSIS — R972 Elevated prostate specific antigen [PSA]: Secondary | ICD-10-CM | POA: Diagnosis not present

## 2021-05-01 DIAGNOSIS — C61 Malignant neoplasm of prostate: Secondary | ICD-10-CM | POA: Diagnosis not present

## 2021-05-01 DIAGNOSIS — Z510 Encounter for antineoplastic radiation therapy: Secondary | ICD-10-CM | POA: Diagnosis not present

## 2021-05-01 DIAGNOSIS — R972 Elevated prostate specific antigen [PSA]: Secondary | ICD-10-CM | POA: Diagnosis not present

## 2021-05-02 ENCOUNTER — Other Ambulatory Visit: Payer: Self-pay | Admitting: Cardiology

## 2021-05-02 DIAGNOSIS — R972 Elevated prostate specific antigen [PSA]: Secondary | ICD-10-CM | POA: Diagnosis not present

## 2021-05-02 DIAGNOSIS — Z510 Encounter for antineoplastic radiation therapy: Secondary | ICD-10-CM | POA: Diagnosis not present

## 2021-05-02 DIAGNOSIS — C61 Malignant neoplasm of prostate: Secondary | ICD-10-CM | POA: Diagnosis not present

## 2021-05-05 DIAGNOSIS — C61 Malignant neoplasm of prostate: Secondary | ICD-10-CM | POA: Diagnosis not present

## 2021-05-05 DIAGNOSIS — R972 Elevated prostate specific antigen [PSA]: Secondary | ICD-10-CM | POA: Diagnosis not present

## 2021-05-05 DIAGNOSIS — Z510 Encounter for antineoplastic radiation therapy: Secondary | ICD-10-CM | POA: Diagnosis not present

## 2021-05-06 DIAGNOSIS — C61 Malignant neoplasm of prostate: Secondary | ICD-10-CM | POA: Diagnosis not present

## 2021-05-06 DIAGNOSIS — R972 Elevated prostate specific antigen [PSA]: Secondary | ICD-10-CM | POA: Diagnosis not present

## 2021-05-06 DIAGNOSIS — Z510 Encounter for antineoplastic radiation therapy: Secondary | ICD-10-CM | POA: Diagnosis not present

## 2021-05-07 DIAGNOSIS — R972 Elevated prostate specific antigen [PSA]: Secondary | ICD-10-CM | POA: Diagnosis not present

## 2021-05-07 DIAGNOSIS — Z510 Encounter for antineoplastic radiation therapy: Secondary | ICD-10-CM | POA: Diagnosis not present

## 2021-05-07 DIAGNOSIS — C61 Malignant neoplasm of prostate: Secondary | ICD-10-CM | POA: Diagnosis not present

## 2021-05-08 DIAGNOSIS — C61 Malignant neoplasm of prostate: Secondary | ICD-10-CM | POA: Diagnosis not present

## 2021-05-08 DIAGNOSIS — Z510 Encounter for antineoplastic radiation therapy: Secondary | ICD-10-CM | POA: Diagnosis not present

## 2021-05-08 DIAGNOSIS — R972 Elevated prostate specific antigen [PSA]: Secondary | ICD-10-CM | POA: Diagnosis not present

## 2021-05-09 DIAGNOSIS — C61 Malignant neoplasm of prostate: Secondary | ICD-10-CM | POA: Diagnosis not present

## 2021-05-09 DIAGNOSIS — R972 Elevated prostate specific antigen [PSA]: Secondary | ICD-10-CM | POA: Diagnosis not present

## 2021-05-09 DIAGNOSIS — Z510 Encounter for antineoplastic radiation therapy: Secondary | ICD-10-CM | POA: Diagnosis not present

## 2021-05-12 DIAGNOSIS — C61 Malignant neoplasm of prostate: Secondary | ICD-10-CM | POA: Diagnosis not present

## 2021-05-12 DIAGNOSIS — R972 Elevated prostate specific antigen [PSA]: Secondary | ICD-10-CM | POA: Diagnosis not present

## 2021-05-12 DIAGNOSIS — Z510 Encounter for antineoplastic radiation therapy: Secondary | ICD-10-CM | POA: Diagnosis not present

## 2021-05-13 DIAGNOSIS — R972 Elevated prostate specific antigen [PSA]: Secondary | ICD-10-CM | POA: Diagnosis not present

## 2021-05-13 DIAGNOSIS — Z510 Encounter for antineoplastic radiation therapy: Secondary | ICD-10-CM | POA: Diagnosis not present

## 2021-05-13 DIAGNOSIS — C61 Malignant neoplasm of prostate: Secondary | ICD-10-CM | POA: Diagnosis not present

## 2021-05-14 DIAGNOSIS — C61 Malignant neoplasm of prostate: Secondary | ICD-10-CM | POA: Diagnosis not present

## 2021-05-14 DIAGNOSIS — Z510 Encounter for antineoplastic radiation therapy: Secondary | ICD-10-CM | POA: Diagnosis not present

## 2021-05-14 DIAGNOSIS — R972 Elevated prostate specific antigen [PSA]: Secondary | ICD-10-CM | POA: Diagnosis not present

## 2021-05-15 DIAGNOSIS — C61 Malignant neoplasm of prostate: Secondary | ICD-10-CM | POA: Diagnosis not present

## 2021-05-15 DIAGNOSIS — Z510 Encounter for antineoplastic radiation therapy: Secondary | ICD-10-CM | POA: Diagnosis not present

## 2021-05-15 DIAGNOSIS — R972 Elevated prostate specific antigen [PSA]: Secondary | ICD-10-CM | POA: Diagnosis not present

## 2021-05-16 DIAGNOSIS — C61 Malignant neoplasm of prostate: Secondary | ICD-10-CM | POA: Diagnosis not present

## 2021-05-16 DIAGNOSIS — Z510 Encounter for antineoplastic radiation therapy: Secondary | ICD-10-CM | POA: Diagnosis not present

## 2021-05-16 DIAGNOSIS — R972 Elevated prostate specific antigen [PSA]: Secondary | ICD-10-CM | POA: Diagnosis not present

## 2021-05-19 ENCOUNTER — Ambulatory Visit (INDEPENDENT_AMBULATORY_CARE_PROVIDER_SITE_OTHER): Payer: Medicare Other

## 2021-05-19 DIAGNOSIS — Z510 Encounter for antineoplastic radiation therapy: Secondary | ICD-10-CM | POA: Diagnosis not present

## 2021-05-19 DIAGNOSIS — I639 Cerebral infarction, unspecified: Secondary | ICD-10-CM

## 2021-05-19 DIAGNOSIS — C61 Malignant neoplasm of prostate: Secondary | ICD-10-CM | POA: Diagnosis not present

## 2021-05-19 DIAGNOSIS — R972 Elevated prostate specific antigen [PSA]: Secondary | ICD-10-CM | POA: Diagnosis not present

## 2021-05-19 LAB — CUP PACEART REMOTE DEVICE CHECK
Date Time Interrogation Session: 20230129231204
Implantable Pulse Generator Implant Date: 20210610

## 2021-05-20 DIAGNOSIS — C61 Malignant neoplasm of prostate: Secondary | ICD-10-CM | POA: Diagnosis not present

## 2021-05-20 DIAGNOSIS — R972 Elevated prostate specific antigen [PSA]: Secondary | ICD-10-CM | POA: Diagnosis not present

## 2021-05-20 DIAGNOSIS — Z510 Encounter for antineoplastic radiation therapy: Secondary | ICD-10-CM | POA: Diagnosis not present

## 2021-05-21 DIAGNOSIS — R972 Elevated prostate specific antigen [PSA]: Secondary | ICD-10-CM | POA: Diagnosis not present

## 2021-05-21 DIAGNOSIS — C61 Malignant neoplasm of prostate: Secondary | ICD-10-CM | POA: Diagnosis not present

## 2021-05-21 DIAGNOSIS — Z510 Encounter for antineoplastic radiation therapy: Secondary | ICD-10-CM | POA: Diagnosis not present

## 2021-05-22 DIAGNOSIS — Z510 Encounter for antineoplastic radiation therapy: Secondary | ICD-10-CM | POA: Diagnosis not present

## 2021-05-22 DIAGNOSIS — R972 Elevated prostate specific antigen [PSA]: Secondary | ICD-10-CM | POA: Diagnosis not present

## 2021-05-22 DIAGNOSIS — C61 Malignant neoplasm of prostate: Secondary | ICD-10-CM | POA: Diagnosis not present

## 2021-05-23 DIAGNOSIS — R972 Elevated prostate specific antigen [PSA]: Secondary | ICD-10-CM | POA: Diagnosis not present

## 2021-05-23 DIAGNOSIS — C61 Malignant neoplasm of prostate: Secondary | ICD-10-CM | POA: Diagnosis not present

## 2021-05-23 DIAGNOSIS — Z510 Encounter for antineoplastic radiation therapy: Secondary | ICD-10-CM | POA: Diagnosis not present

## 2021-05-26 DIAGNOSIS — C61 Malignant neoplasm of prostate: Secondary | ICD-10-CM | POA: Diagnosis not present

## 2021-05-26 DIAGNOSIS — R972 Elevated prostate specific antigen [PSA]: Secondary | ICD-10-CM | POA: Diagnosis not present

## 2021-05-26 DIAGNOSIS — Z510 Encounter for antineoplastic radiation therapy: Secondary | ICD-10-CM | POA: Diagnosis not present

## 2021-05-27 DIAGNOSIS — C61 Malignant neoplasm of prostate: Secondary | ICD-10-CM | POA: Diagnosis not present

## 2021-05-27 DIAGNOSIS — R972 Elevated prostate specific antigen [PSA]: Secondary | ICD-10-CM | POA: Diagnosis not present

## 2021-05-27 DIAGNOSIS — Z510 Encounter for antineoplastic radiation therapy: Secondary | ICD-10-CM | POA: Diagnosis not present

## 2021-05-27 NOTE — Progress Notes (Signed)
Carelink Summary Report / Loop Recorder 

## 2021-05-28 DIAGNOSIS — Z510 Encounter for antineoplastic radiation therapy: Secondary | ICD-10-CM | POA: Diagnosis not present

## 2021-05-28 DIAGNOSIS — R972 Elevated prostate specific antigen [PSA]: Secondary | ICD-10-CM | POA: Diagnosis not present

## 2021-05-28 DIAGNOSIS — C61 Malignant neoplasm of prostate: Secondary | ICD-10-CM | POA: Diagnosis not present

## 2021-05-29 DIAGNOSIS — C61 Malignant neoplasm of prostate: Secondary | ICD-10-CM | POA: Diagnosis not present

## 2021-05-29 DIAGNOSIS — Z510 Encounter for antineoplastic radiation therapy: Secondary | ICD-10-CM | POA: Diagnosis not present

## 2021-05-29 DIAGNOSIS — R972 Elevated prostate specific antigen [PSA]: Secondary | ICD-10-CM | POA: Diagnosis not present

## 2021-06-09 DIAGNOSIS — M25562 Pain in left knee: Secondary | ICD-10-CM | POA: Diagnosis not present

## 2021-06-09 DIAGNOSIS — G8929 Other chronic pain: Secondary | ICD-10-CM | POA: Diagnosis not present

## 2021-06-09 DIAGNOSIS — L578 Other skin changes due to chronic exposure to nonionizing radiation: Secondary | ICD-10-CM | POA: Diagnosis not present

## 2021-06-09 DIAGNOSIS — L570 Actinic keratosis: Secondary | ICD-10-CM | POA: Diagnosis not present

## 2021-06-09 DIAGNOSIS — L820 Inflamed seborrheic keratosis: Secondary | ICD-10-CM | POA: Diagnosis not present

## 2021-06-09 DIAGNOSIS — L821 Other seborrheic keratosis: Secondary | ICD-10-CM | POA: Diagnosis not present

## 2021-06-10 DIAGNOSIS — C61 Malignant neoplasm of prostate: Secondary | ICD-10-CM | POA: Diagnosis not present

## 2021-06-16 DIAGNOSIS — Z20822 Contact with and (suspected) exposure to covid-19: Secondary | ICD-10-CM | POA: Diagnosis not present

## 2021-06-23 ENCOUNTER — Ambulatory Visit (INDEPENDENT_AMBULATORY_CARE_PROVIDER_SITE_OTHER): Payer: Medicare Other

## 2021-06-23 DIAGNOSIS — I639 Cerebral infarction, unspecified: Secondary | ICD-10-CM

## 2021-06-24 LAB — CUP PACEART REMOTE DEVICE CHECK
Date Time Interrogation Session: 20230303231521
Implantable Pulse Generator Implant Date: 20210610

## 2021-06-28 ENCOUNTER — Other Ambulatory Visit: Payer: Self-pay | Admitting: Cardiology

## 2021-07-04 NOTE — Progress Notes (Signed)
Carelink Summary Report / Loop Recorder 

## 2021-07-08 DIAGNOSIS — N3289 Other specified disorders of bladder: Secondary | ICD-10-CM | POA: Diagnosis not present

## 2021-07-08 DIAGNOSIS — C61 Malignant neoplasm of prostate: Secondary | ICD-10-CM | POA: Diagnosis not present

## 2021-07-22 DIAGNOSIS — Z20822 Contact with and (suspected) exposure to covid-19: Secondary | ICD-10-CM | POA: Diagnosis not present

## 2021-07-24 DIAGNOSIS — Z20822 Contact with and (suspected) exposure to covid-19: Secondary | ICD-10-CM | POA: Diagnosis not present

## 2021-07-28 ENCOUNTER — Ambulatory Visit: Payer: Medicare Other

## 2021-08-01 DIAGNOSIS — R5383 Other fatigue: Secondary | ICD-10-CM | POA: Diagnosis not present

## 2021-08-01 DIAGNOSIS — M2550 Pain in unspecified joint: Secondary | ICD-10-CM | POA: Diagnosis not present

## 2021-08-04 DIAGNOSIS — G8929 Other chronic pain: Secondary | ICD-10-CM | POA: Diagnosis not present

## 2021-08-04 DIAGNOSIS — Z20822 Contact with and (suspected) exposure to covid-19: Secondary | ICD-10-CM | POA: Diagnosis not present

## 2021-08-04 DIAGNOSIS — M25512 Pain in left shoulder: Secondary | ICD-10-CM | POA: Diagnosis not present

## 2021-08-08 DIAGNOSIS — M19011 Primary osteoarthritis, right shoulder: Secondary | ICD-10-CM | POA: Diagnosis not present

## 2021-08-08 DIAGNOSIS — Z043 Encounter for examination and observation following other accident: Secondary | ICD-10-CM | POA: Diagnosis not present

## 2021-08-08 DIAGNOSIS — S46911A Strain of unspecified muscle, fascia and tendon at shoulder and upper arm level, right arm, initial encounter: Secondary | ICD-10-CM | POA: Diagnosis not present

## 2021-08-15 DIAGNOSIS — G8929 Other chronic pain: Secondary | ICD-10-CM | POA: Diagnosis not present

## 2021-08-15 DIAGNOSIS — M25511 Pain in right shoulder: Secondary | ICD-10-CM | POA: Diagnosis not present

## 2021-08-20 DIAGNOSIS — C61 Malignant neoplasm of prostate: Secondary | ICD-10-CM | POA: Diagnosis not present

## 2021-08-20 DIAGNOSIS — N3289 Other specified disorders of bladder: Secondary | ICD-10-CM | POA: Diagnosis not present

## 2021-08-25 DIAGNOSIS — Z20822 Contact with and (suspected) exposure to covid-19: Secondary | ICD-10-CM | POA: Diagnosis not present

## 2021-08-28 ENCOUNTER — Ambulatory Visit (INDEPENDENT_AMBULATORY_CARE_PROVIDER_SITE_OTHER): Payer: Medicare Other | Admitting: Cardiology

## 2021-08-28 ENCOUNTER — Other Ambulatory Visit: Payer: Self-pay

## 2021-08-28 ENCOUNTER — Encounter: Payer: Self-pay | Admitting: Cardiology

## 2021-08-28 VITALS — BP 130/72 | HR 74 | Ht 66.0 in | Wt 210.6 lb

## 2021-08-28 DIAGNOSIS — Z951 Presence of aortocoronary bypass graft: Secondary | ICD-10-CM | POA: Diagnosis not present

## 2021-08-28 DIAGNOSIS — I209 Angina pectoris, unspecified: Secondary | ICD-10-CM | POA: Diagnosis not present

## 2021-08-28 DIAGNOSIS — I25118 Atherosclerotic heart disease of native coronary artery with other forms of angina pectoris: Secondary | ICD-10-CM

## 2021-08-28 DIAGNOSIS — I5032 Chronic diastolic (congestive) heart failure: Secondary | ICD-10-CM

## 2021-08-28 DIAGNOSIS — E782 Mixed hyperlipidemia: Secondary | ICD-10-CM

## 2021-08-28 MED ORDER — NITROGLYCERIN 0.4 MG SL SUBL: 0.4000 mg | SUBLINGUAL_TABLET | SUBLINGUAL | 11 refills | Status: DC | PRN

## 2021-08-28 NOTE — Patient Instructions (Signed)

## 2021-08-28 NOTE — Progress Notes (Signed)
?Cardiology Office Note:   ? ?Date:  08/28/2021  ? ?ID:  Ernest Booker, DOB [birth year], MRN [id number] ? ?PCP:  [name], MD  ?Cardiologist:  Jenne Campus, MD   ? ?Referring MD: [name], MD  ? ?Chief Complaint  ?Patient presents with  ? Follow-up  ?Doing fine cardiac wise ? ?History of Present Illness:   ? ?Ernest Booker is a 76 y.o. male  with past medical history significant for coronary artery bypass graft which was done in 2018, with LIMA to LAD SVG to diagonal SVG to ramus and SVG to PDA.  Then cardiac catheterization done 6 months later showed occlusion of all venous grafts except LIMA to LAD being open.  Since that time he been struggling with episode of angina.  He did have cardiac catheterization in 2021 November which basically showed occluded grafts and no target lesions for intervention.  He also got history of essential hypertension, dyslipidemia..  Recently he was diagnosed to have a prostate cancer he initiated radiation therapy as well as hormonal therapy  ?He comes today to muscle follow-up overall he seems to be doing quite well from cardiac standpoint of view.  He denies have any unusual chest pain tightness squeezing pressure burning chest.  He seems to be tolerating therapy for his prostate cancer well he is on hormonal therapy as well as on radiation ? ?Past Medical History:  ?Diagnosis Date  ? Acute on chronic diastolic CHF (congestive heart failure) ([place]) 02/25/2020  ? Angina pectoris ([place])   ? Anginal pain ([place])   ? Arthritis   ? Atrial fibrillation ([place]) 09/15/2017  ? Bilateral numbness and tingling of arms and legs 10/29/2015  ? CAD (coronary artery disease) 09/28/2016  ? Cardiogenic shock ([place])   ? Cervical disc disease 10/29/2015  ? Chest pain at rest 09/15/2017  ? Chronic diastolic CHF (congestive heart failure) ([place]) 02/25/2020  ? Chronic kidney disease (CKD), stage III (moderate) ([place]) 09/15/2017  ? Coronary artery disease   ? Edema   ? Encounter for therapeutic  drug monitoring 10/14/2016  ? History of TIA (transient ischemic attack) 07/27/2019  ? Hx of CABG   ? Hyperlipemia 09/24/2016  ? Hyperlipidemia   ? Hyperlipidemia LDL goal <70   ? Hypertension   ? Ischemic cardiomyopathy 10/22/2016  ? Myocardial infarction [facility])   ? Neck pain 10/29/2015  ? Non-ST elevation (NSTEMI) myocardial infarction [facility])   ? NSTEMI (non-ST elevated myocardial infarction) ([place]) 09/24/2016  ? Numbness in left leg 10/29/2015  ? Paroxysmal tachycardia ([place])   ? Pleural effusion   ? Presence of aortocoronary bypass graft 10/08/2016  ? LIMA to LAD -SVG to DIAG SVG to RAMUS SVG to PDA  All grafts occluded except LIMA based on cardiac catheterization 07/2016  ? Prostate cancer ([place])   ? Respiratory failure ([place])   ? S/P CABG x 4 10/08/2016  ? LIMA to LAD -SVG to DIAG SVG to RAMUS SVG to PDA  All grafts occluded except LIMA based on cardiac catheterization 07/2016  ? Shortness of breath   ? Weakness 10/29/2015  ? Weakness of both arms 10/29/2015  ? ? ?Past Surgical History:  ?Procedure Laterality Date  ? CORONARY ARTERY BYPASS GRAFT N/A 09/28/2016  ? Procedure: CORONARY ARTERY BYPASS GRAFTING (CABG) x four, using left internal mammary artery and right leg greater saphenous vein harvested endoscopically;  Surgeon: Ivin Poot, MD;  Location: [place];  Service: Open Heart Surgery;  Laterality: N/A;  ? IR THORACENTESIS ASP PLEURAL SPACE W/IMG GUIDE  10/06/2016  ? LEFT HEART CATH AND CORONARY ANGIOGRAPHY N/A 09/24/2016  ? Procedure: Left Heart Cath and Coronary Angiography;  Surgeon: Lorretta Harp, MD;  Location: [place] CV LAB;  Service: Cardiovascular;  Laterality: N/A;  ? LEFT HEART CATH AND CORS/GRAFTS ANGIOGRAPHY N/A 02/11/2017  ? Procedure: LEFT HEART CATH AND CORS/GRAFTS ANGIOGRAPHY;  Surgeon: Jettie Booze, MD;  Location: [place] CV LAB;  Service: Cardiovascular;  Laterality: N/A;  ? LEFT HEART CATH AND CORS/GRAFTS ANGIOGRAPHY N/A 02/27/2020  ? Procedure: LEFT HEART CATH AND  CORS/GRAFTS ANGIOGRAPHY;  Surgeon: Martinique, Peter M, MD;  Location: [place] CV LAB;  Service: Cardiovascular;  Laterality: N/A;  ? TEE WITHOUT CARDIOVERSION N/A 09/28/2016  ? Procedure: TRANSESOPHAGEAL ECHOCARDIOGRAM (TEE);  Surgeon: Prescott Gum, Collier Salina, MD;  Location: [place];  Service: Open Heart Surgery;  Laterality: N/A;  ? ULTRASOUND GUIDANCE FOR VASCULAR ACCESS  02/11/2017  ? Procedure: Ultrasound Guidance For Vascular Access;  Surgeon: Jettie Booze, MD;  Location: [place] CV LAB;  Service: Cardiovascular;;  ? ? ?Current Medications: ?Current Meds  ?Medication Sig  ? aspirin 81 MG EC tablet Take 1 tablet (81 mg total) by mouth daily.  ? Cyanocobalamin (VITAMIN B-12 ER PO) Take 1 tablet by mouth daily. Unknown strength  ? furosemide (LASIX) 40 MG tablet TAKE 1 TABLET BY MOUTH EVERY DAY (Patient taking differently: Take 40 mg by mouth daily. TAKE 1 TABLET BY MOUTH EVERY DAY)  ? isosorbide mononitrate (IMDUR) 60 MG 24 hr tablet TAKE 4 TABLETS (240 MG TOTAL) BY MOUTH DAILY.  ? metoprolol tartrate (LOPRESSOR) 25 MG tablet Take 0.5 tablets (12.5 mg total) by mouth 2 (two) times daily.  ? nitroGLYCERIN (NITROSTAT) 0.4 MG SL tablet Place 1 tablet (0.4 mg total) under the tongue every 5 (five) minutes as needed for chest pain.  ? pantoprazole (PROTONIX) 40 MG tablet Take 40 mg by mouth in the morning and at bedtime.   ? potassium chloride (KLOR-CON) 10 MEQ tablet TAKE 1 TABLET BY MOUTH EVERY DAY (Patient taking differently: Take 10 mEq by mouth daily.)  ? ranolazine (RANEXA) 1000 MG SR tablet Take 1 tablet by mouth twice daily (Patient taking differently: Take 1,000 mg by mouth 2 (two) times daily.)  ? rosuvastatin (CRESTOR) 20 MG tablet TAKE 1 TABLET BY MOUTH ONCE DAILY AT 6 PM (Patient taking differently: Take 20 mg by mouth daily.)  ? XARELTO 2.5 MG TABS tablet TAKE 1 TABLET BY MOUTH TWICE A DAY (Patient taking differently: Take 2.5 mg by mouth 2 (two) times daily.)  ?  ? ?Allergies:   Benadryl  [diphenhydramine hcl (sleep)] and Levofloxacin  ? ?Social History  ? ?Socioeconomic History  ? Marital status: Married  ?  Spouse name: Not on file  ? Number of children: Not on file  ? Years of education: Not on file  ? Highest education level: Not on file  ?Occupational History  ? Occupation: Retired  ?Tobacco Use  ? Smoking status: Never  ? Smokeless tobacco: Never  ?Vaping Use  ? Vaping Use: Never used  ?Substance and Sexual Activity  ? Alcohol use: No  ? Drug use: No  ? Sexual activity: Yes  ?  Birth control/protection: None  ?Other Topics Concern  ? Not on file  ?Social History Narrative  ? Not on file  ? ?Social Determinants of Health  ? ?Financial Resource Strain: Not on file  ?Food Insecurity: Not on file  ?Transportation Needs: Not on file  ?Physical Activity: Not on file  ?  Stress: Not on file  ?Social Connections: Not on file  ?  ? ?Family History: ?The patient's family history includes Heart attack in his father; Heart failure in his mother. ?ROS:   ?Please see the history of present illness.    ?All 14 point review of systems negative except as described per history of present illness ? ?EKGs/Labs/Other Studies Reviewed:   ? ? ? ?Recent Labs: ?10/25/2020: NT-Pro BNP 206  ?Recent Lipid Panel ?   ?Component Value Date/Time  ? CHOL 125 10/25/2020 0958  ? TRIG 111 10/25/2020 0958  ? HDL 53 10/25/2020 0958  ? CHOLHDL 2.4 10/25/2020 0958  ? CHOLHDL 2.8 02/27/2020 0343  ? VLDL 19 02/27/2020 0343  ? [place] 52 10/25/2020 0958  ? ? ?Physical Exam:   ? ?VS:  BP 130/72 (BP Location: Left Arm, Patient Position: Sitting)   Pulse 74   Ht '5\' 6"'$  (1.676 m)   Wt 210 lb 9.6 oz (95.5 kg)   SpO2 97%   BMI 33.99 kg/m?    ? ?Wt Readings from Last 3 Encounters:  ?08/28/21 210 lb 9.6 oz (95.5 kg)  ?03/27/21 214 lb 12.8 oz (97.4 kg)  ?10/25/20 208 lb 12.8 oz (94.7 kg)  ?  ? ?GEN:  Well nourished, well developed in no acute distress ?HEENT: Normal ?NECK: No JVD; No carotid bruits ?LYMPHATICS: No lymphadenopathy ?CARDIAC: RRR,  no murmurs, no rubs, no gallops ?RESPIRATORY:  Clear to auscultation without rales, wheezing or rhonchi  ?ABDOMEN: Soft, non-tender, non-distended ?MUSCULOSKELETAL:  No edema; No deformity  ?SKIN: Warm and dry ?LOWER EXTREMITIES:

## 2021-09-01 ENCOUNTER — Ambulatory Visit (INDEPENDENT_AMBULATORY_CARE_PROVIDER_SITE_OTHER): Payer: Medicare Other

## 2021-09-01 DIAGNOSIS — I639 Cerebral infarction, unspecified: Secondary | ICD-10-CM

## 2021-09-03 LAB — CUP PACEART REMOTE DEVICE CHECK
Date Time Interrogation Session: 20230512231615
Implantable Pulse Generator Implant Date: 20210610

## 2021-09-08 DIAGNOSIS — C61 Malignant neoplasm of prostate: Secondary | ICD-10-CM | POA: Diagnosis not present

## 2021-09-23 NOTE — Progress Notes (Signed)
Carelink Summary Report / Loop Recorder 

## 2021-09-24 ENCOUNTER — Other Ambulatory Visit: Payer: Self-pay | Admitting: Cardiology

## 2021-09-28 ENCOUNTER — Other Ambulatory Visit: Payer: Self-pay | Admitting: Cardiology

## 2021-09-29 NOTE — Telephone Encounter (Signed)
Prescription refill request for Xarelto received.  Indication: Last office visit:08/28/21 Ernest Booker)  Weight: 95.5kg Age: 76 Scr: CrCl:

## 2021-09-29 NOTE — Telephone Encounter (Signed)
Prescription refill request for Xarelto received.  Indication: CAD/TIA Last office visit: 08/28/21  Nelta Numbers MD Weight: 95.5kg Age: 76 Scr: 1.13 on 07/08/21 CrCl: 78.64  Based on above findings Xarelto 2.'5mg'$  twice daily is the appropriate dose.  Refill approved.

## 2021-10-02 DIAGNOSIS — I509 Heart failure, unspecified: Secondary | ICD-10-CM | POA: Diagnosis not present

## 2021-10-02 DIAGNOSIS — R5383 Other fatigue: Secondary | ICD-10-CM | POA: Diagnosis not present

## 2021-10-02 DIAGNOSIS — I110 Hypertensive heart disease with heart failure: Secondary | ICD-10-CM | POA: Diagnosis not present

## 2021-10-03 DIAGNOSIS — I252 Old myocardial infarction: Secondary | ICD-10-CM | POA: Diagnosis not present

## 2021-10-03 DIAGNOSIS — R531 Weakness: Secondary | ICD-10-CM | POA: Diagnosis not present

## 2021-10-03 DIAGNOSIS — Z8701 Personal history of pneumonia (recurrent): Secondary | ICD-10-CM | POA: Diagnosis not present

## 2021-10-03 DIAGNOSIS — Z8673 Personal history of transient ischemic attack (TIA), and cerebral infarction without residual deficits: Secondary | ICD-10-CM | POA: Diagnosis not present

## 2021-10-03 DIAGNOSIS — Z888 Allergy status to other drugs, medicaments and biological substances status: Secondary | ICD-10-CM | POA: Diagnosis not present

## 2021-10-03 DIAGNOSIS — K219 Gastro-esophageal reflux disease without esophagitis: Secondary | ICD-10-CM | POA: Diagnosis present

## 2021-10-03 DIAGNOSIS — E669 Obesity, unspecified: Secondary | ICD-10-CM | POA: Diagnosis not present

## 2021-10-03 DIAGNOSIS — Z6834 Body mass index (BMI) 34.0-34.9, adult: Secondary | ICD-10-CM | POA: Diagnosis not present

## 2021-10-03 DIAGNOSIS — I2510 Atherosclerotic heart disease of native coronary artery without angina pectoris: Secondary | ICD-10-CM | POA: Diagnosis not present

## 2021-10-03 DIAGNOSIS — E785 Hyperlipidemia, unspecified: Secondary | ICD-10-CM | POA: Diagnosis not present

## 2021-10-03 DIAGNOSIS — Z79899 Other long term (current) drug therapy: Secondary | ICD-10-CM | POA: Diagnosis not present

## 2021-10-03 DIAGNOSIS — I5023 Acute on chronic systolic (congestive) heart failure: Secondary | ICD-10-CM | POA: Diagnosis not present

## 2021-10-03 DIAGNOSIS — I25810 Atherosclerosis of coronary artery bypass graft(s) without angina pectoris: Secondary | ICD-10-CM | POA: Diagnosis not present

## 2021-10-03 DIAGNOSIS — M1990 Unspecified osteoarthritis, unspecified site: Secondary | ICD-10-CM | POA: Diagnosis not present

## 2021-10-03 DIAGNOSIS — I479 Paroxysmal tachycardia, unspecified: Secondary | ICD-10-CM | POA: Diagnosis not present

## 2021-10-03 DIAGNOSIS — I509 Heart failure, unspecified: Secondary | ICD-10-CM | POA: Diagnosis not present

## 2021-10-03 DIAGNOSIS — F32A Depression, unspecified: Secondary | ICD-10-CM | POA: Diagnosis not present

## 2021-10-03 DIAGNOSIS — I471 Supraventricular tachycardia: Secondary | ICD-10-CM | POA: Diagnosis not present

## 2021-10-03 DIAGNOSIS — Z7901 Long term (current) use of anticoagulants: Secondary | ICD-10-CM | POA: Diagnosis not present

## 2021-10-03 DIAGNOSIS — Z951 Presence of aortocoronary bypass graft: Secondary | ICD-10-CM | POA: Diagnosis not present

## 2021-10-03 DIAGNOSIS — R5383 Other fatigue: Secondary | ICD-10-CM | POA: Diagnosis not present

## 2021-10-03 DIAGNOSIS — I110 Hypertensive heart disease with heart failure: Secondary | ICD-10-CM | POA: Diagnosis not present

## 2021-10-13 DIAGNOSIS — R5383 Other fatigue: Secondary | ICD-10-CM | POA: Diagnosis not present

## 2021-10-13 DIAGNOSIS — Z6833 Body mass index (BMI) 33.0-33.9, adult: Secondary | ICD-10-CM | POA: Diagnosis not present

## 2021-10-13 DIAGNOSIS — I5032 Chronic diastolic (congestive) heart failure: Secondary | ICD-10-CM | POA: Diagnosis not present

## 2021-10-13 DIAGNOSIS — Z79899 Other long term (current) drug therapy: Secondary | ICD-10-CM | POA: Diagnosis not present

## 2021-10-13 DIAGNOSIS — R531 Weakness: Secondary | ICD-10-CM | POA: Diagnosis not present

## 2021-10-16 ENCOUNTER — Other Ambulatory Visit: Payer: Self-pay | Admitting: Cardiology

## 2021-10-16 ENCOUNTER — Telehealth: Payer: Self-pay | Admitting: Cardiology

## 2021-10-16 MED ORDER — POTASSIUM CHLORIDE ER 10 MEQ PO TBCR: 10.0000 meq | EXTENDED_RELEASE_TABLET | Freq: Every day | ORAL | 2 refills | Status: DC

## 2021-10-16 NOTE — Telephone Encounter (Signed)
*  STAT* If patient is at the pharmacy, call can be transferred to refill team.   1. Which medications need to be refilled? (please list name of each medication and dose if known) potassium chloride (KLOR-CON) 10 MEQ tablet  2. Which pharmacy/location (including street and city if local pharmacy) is medication to be sent to? [place], [place]  3. Do they need a 30 day or 90 day supply? 90   Patient is out of medication

## 2021-10-28 DIAGNOSIS — L03313 Cellulitis of chest wall: Secondary | ICD-10-CM | POA: Diagnosis not present

## 2021-10-28 DIAGNOSIS — L720 Epidermal cyst: Secondary | ICD-10-CM | POA: Diagnosis not present

## 2021-10-30 ENCOUNTER — Other Ambulatory Visit: Payer: Self-pay | Admitting: Cardiology

## 2021-11-03 DIAGNOSIS — L02213 Cutaneous abscess of chest wall: Secondary | ICD-10-CM | POA: Diagnosis not present

## 2021-11-03 DIAGNOSIS — L0390 Cellulitis, unspecified: Secondary | ICD-10-CM | POA: Diagnosis not present

## 2021-11-10 ENCOUNTER — Ambulatory Visit (INDEPENDENT_AMBULATORY_CARE_PROVIDER_SITE_OTHER): Payer: Medicare Other

## 2021-11-10 DIAGNOSIS — I639 Cerebral infarction, unspecified: Secondary | ICD-10-CM

## 2021-11-11 ENCOUNTER — Encounter: Payer: Self-pay | Admitting: Cardiology

## 2021-11-11 ENCOUNTER — Ambulatory Visit (INDEPENDENT_AMBULATORY_CARE_PROVIDER_SITE_OTHER): Payer: Medicare Other | Admitting: Cardiology

## 2021-11-11 VITALS — BP 130/72 | HR 83 | Ht 66.0 in | Wt 208.0 lb

## 2021-11-11 DIAGNOSIS — I209 Angina pectoris, unspecified: Secondary | ICD-10-CM | POA: Diagnosis not present

## 2021-11-11 DIAGNOSIS — I214 Non-ST elevation (NSTEMI) myocardial infarction: Secondary | ICD-10-CM | POA: Diagnosis not present

## 2021-11-11 DIAGNOSIS — I255 Ischemic cardiomyopathy: Secondary | ICD-10-CM

## 2021-11-11 DIAGNOSIS — Z951 Presence of aortocoronary bypass graft: Secondary | ICD-10-CM

## 2021-11-11 DIAGNOSIS — I25118 Atherosclerotic heart disease of native coronary artery with other forms of angina pectoris: Secondary | ICD-10-CM | POA: Diagnosis not present

## 2021-11-11 DIAGNOSIS — I219 Acute myocardial infarction, unspecified: Secondary | ICD-10-CM | POA: Diagnosis not present

## 2021-11-11 LAB — CUP PACEART REMOTE DEVICE CHECK
Date Time Interrogation Session: 20230717232429
Implantable Pulse Generator Implant Date: 20210610

## 2021-11-11 MED ORDER — FUROSEMIDE 20 MG PO TABS: 20.0000 mg | ORAL_TABLET | Freq: Every day | ORAL | 3 refills | Status: DC

## 2021-11-11 NOTE — Patient Instructions (Signed)
Medication Instructions:  Your physician has recommended you make the following change in your medication: DECREASE: Lasix to '20mg'$  daily by mouth                     STOP: Potassium                         Lab Work: BMP, ProBNP - Today If you have labs (blood work) drawn today and your tests are completely normal, you will receive your results only by: [place] (if you have MyChart) OR A paper copy in the mail If you have any lab test that is abnormal or we need to change your treatment, we will call you to review the results.   Testing/Procedures: None Ordered   Follow-Up: At [facility], you and your health needs are our priority.  As part of our continuing mission to provide you with exceptional heart care, we have created designated Provider Care Teams.  These Care Teams include your primary Cardiologist (physician) and Advanced Practice Providers (APPs -  Physician Assistants and Nurse Practitioners) who all work together to provide you with the care you need, when you need it.  We recommend signing up for the patient portal called "MyChart".  Sign up information is provided on this After Visit Summary.  MyChart is used to connect with patients for Virtual Visits (Telemedicine).  Patients are able to view lab/test results, encounter notes, upcoming appointments, etc.  Non-urgent messages can be sent to your provider as well.   To learn more about what you can do with MyChart, go to [email or web address].    Your next appointment:   6 week(s)  The format for your next appointment:   In Person  Provider:   Jenne Campus, MD    Other Instructions NA .instmed

## 2021-11-11 NOTE — Progress Notes (Unsigned)
Cardiology Office Note:    Date:  11/11/2021   ID:  Ernest Booker, DOB Dec 18, 1945, MRN [id number]  PCP:  [name], MD  Cardiologist:  Jenne Campus, MD    Referring MD: [name], MD   Chief Complaint  Patient presents with   Follow-up  I am doing better  History of Present Illness:    Ernest Booker is a 76 y.o. male  with past medical history significant for coronary artery bypass graft which was done in 2018, with LIMA to LAD SVG to diagonal SVG to ramus and SVG to PDA.  Then cardiac catheterization done 6 months later showed occlusion of all venous grafts except LIMA to LAD being open.  Since that time he been struggling with episode of angina.  He did have cardiac catheterization in 2021 November which basically showed occluded grafts and no target lesions for intervention.  He also got history of essential hypertension, dyslipidemia..  Recently he was diagnosed to have a prostate cancer he initiated radiation therapy as well as hormonal therapy  About a month ago he ended up going to [facility], the reason for that was swelling of lower extremities and shortness of breath.  He was find to be in mildly decompensated congestive heart failure, he was put on diuretics and managed appropriately discharged home.  Since that time he is doing better he is very particular about avoidance of salt and restriction of fluid he describes however to have some dizziness which is orthostatic.  He fell down one time.  He did not passed out.  Denies have any chest pain tightness squeezing pressure burning chest overall cardiac wise wise doing well.  Past Medical History:  Diagnosis Date   Acute on chronic diastolic CHF (congestive heart failure) (HCC) 02/25/2020   Angina pectoris (HCC)    Anginal pain (HCC)    Arthritis    Atrial fibrillation ([place]) 09/15/2017   Bilateral numbness and tingling of arms and legs 10/29/2015   CAD (coronary artery disease) 09/28/2016   Cardiogenic  shock (HCC)    Cervical disc disease 10/29/2015   Chest pain at rest 09/15/2017   Chronic diastolic CHF (congestive heart failure) ([place]) 02/25/2020   Chronic kidney disease (CKD), stage III (moderate) ([place]) 09/15/2017   Coronary artery disease    Edema    Encounter for therapeutic drug monitoring 10/14/2016   History of TIA (transient ischemic attack) 07/27/2019   Hx of CABG    Hyperlipemia 09/24/2016   Hyperlipidemia    Hyperlipidemia LDL goal <70    Hypertension    Ischemic cardiomyopathy 10/22/2016   Myocardial infarction [facility])    Neck pain 10/29/2015   Non-ST elevation (NSTEMI) myocardial infarction [facility])    NSTEMI (non-ST elevated myocardial infarction) ([place]) 09/24/2016   Numbness in left leg 10/29/2015   Paroxysmal tachycardia (HCC)    Pleural effusion    Presence of aortocoronary bypass graft 10/08/2016   LIMA to LAD -SVG to DIAG SVG to RAMUS SVG to PDA  All grafts occluded except LIMA based on cardiac catheterization 07/2016   Prostate cancer ([place])    Respiratory failure (HCC)    S/P CABG x 4 10/08/2016   LIMA to LAD -SVG to DIAG SVG to RAMUS SVG to PDA  All grafts occluded except LIMA based on cardiac catheterization 07/2016   Shortness of breath    Weakness 10/29/2015   Weakness of both arms 10/29/2015    Past Surgical History:  Procedure Laterality Date   CORONARY ARTERY BYPASS GRAFT N/A  09/28/2016   Procedure: CORONARY ARTERY BYPASS GRAFTING (CABG) x four, using left internal mammary artery and right leg greater saphenous vein harvested endoscopically;  Surgeon: Ivin Poot, MD;  Location: [place];  Service: Open Heart Surgery;  Laterality: N/A;   IR THORACENTESIS ASP PLEURAL SPACE W/IMG GUIDE  10/06/2016   LEFT HEART CATH AND CORONARY ANGIOGRAPHY N/A 09/24/2016   Procedure: Left Heart Cath and Coronary Angiography;  Surgeon: Lorretta Harp, MD;  Location: [place] CV LAB;  Service: Cardiovascular;  Laterality: N/A;   LEFT HEART CATH AND CORS/GRAFTS ANGIOGRAPHY  N/A 02/11/2017   Procedure: LEFT HEART CATH AND CORS/GRAFTS ANGIOGRAPHY;  Surgeon: Jettie Booze, MD;  Location: [place] CV LAB;  Service: Cardiovascular;  Laterality: N/A;   LEFT HEART CATH AND CORS/GRAFTS ANGIOGRAPHY N/A 02/27/2020   Procedure: LEFT HEART CATH AND CORS/GRAFTS ANGIOGRAPHY;  Surgeon: Martinique, Peter M, MD;  Location: [place] CV LAB;  Service: Cardiovascular;  Laterality: N/A;   TEE WITHOUT CARDIOVERSION N/A 09/28/2016   Procedure: TRANSESOPHAGEAL ECHOCARDIOGRAM (TEE);  Surgeon: Prescott Gum, Collier Salina, MD;  Location: [place];  Service: Open Heart Surgery;  Laterality: N/A;   ULTRASOUND GUIDANCE FOR VASCULAR ACCESS  02/11/2017   Procedure: Ultrasound Guidance For Vascular Access;  Surgeon: Jettie Booze, MD;  Location: [place] CV LAB;  Service: Cardiovascular;;    Current Medications: Current Meds  Medication Sig   aspirin 81 MG EC tablet Take 1 tablet (81 mg total) by mouth daily.   furosemide (LASIX) 40 MG tablet TAKE 1 TABLET BY MOUTH EVERY DAY (Patient taking differently: Take 40 mg by mouth daily. TAKE 1 TABLET BY MOUTH EVERY DAY)   isosorbide mononitrate (IMDUR) 60 MG 24 hr tablet Take 4 tablets (240 mg total) by mouth daily.   metoprolol tartrate (LOPRESSOR) 25 MG tablet TAKE 1 TABLET BY MOUTH TWICE A DAY (Patient taking differently: Take 25 mg by mouth 2 (two) times daily.)   nitroGLYCERIN (NITROSTAT) 0.4 MG SL tablet Place 1 tablet (0.4 mg total) under the tongue every 5 (five) minutes as needed for chest pain.   pantoprazole (PROTONIX) 40 MG tablet Take 40 mg by mouth in the morning and at bedtime.    potassium chloride (KLOR-CON) 10 MEQ tablet Take 1 tablet (10 mEq total) by mouth daily.   ranolazine (RANEXA) 1000 MG SR tablet Take 1 tablet by mouth twice daily (Patient taking differently: Take 1,000 mg by mouth 2 (two) times daily.)   rivaroxaban (XARELTO) 2.5 MG TABS tablet Take 1 tablet by mouth twice daily (Patient taking differently: Take 2.5 mg by  mouth 2 (two) times daily.)   rosuvastatin (CRESTOR) 20 MG tablet TAKE 1 TABLET BY MOUTH ONCE DAILY AT 6 PM (Patient taking differently: Take 20 mg by mouth daily.)   [DISCONTINUED] Cyanocobalamin (VITAMIN B-12 ER PO) Take 1 tablet by mouth daily. Unknown strength     Allergies:   Benadryl [diphenhydramine hcl (sleep)] and Levofloxacin   Social History   Socioeconomic History   Marital status: Married    Spouse name: Not on file   Number of children: Not on file   Years of education: Not on file   Highest education level: Not on file  Occupational History   Occupation: Retired  Tobacco Use   Smoking status: Never   Smokeless tobacco: Never  Vaping Use   Vaping Use: Never used  Substance and Sexual Activity   Alcohol use: No   Drug use: No   Sexual activity: Yes  Birth control/protection: None  Other Topics Concern   Not on file  Social History Narrative   Not on file   Social Determinants of Health   Financial Resource Strain: Not on file  Food Insecurity: Not on file  Transportation Needs: Not on file  Physical Activity: Not on file  Stress: Not on file  Social Connections: Not on file     Family History: The patient's family history includes Heart attack in his father; Heart failure in his mother. ROS:   Please see the history of present illness.    All 14 point review of systems negative except as described per history of present illness  EKGs/Labs/Other Studies Reviewed:      Recent Labs: No results found for requested labs within last 365 days.  Recent Lipid Panel    Component Value Date/Time   CHOL 125 10/25/2020 0958   TRIG 111 10/25/2020 0958   HDL 53 10/25/2020 0958   CHOLHDL 2.4 10/25/2020 0958   CHOLHDL 2.8 02/27/2020 0343   VLDL 19 02/27/2020 0343   LDLCALC 52 10/25/2020 0958    Physical Exam:    VS:  BP 130/72 (BP Location: Left Arm, Patient Position: Sitting)   Pulse 83   Ht '5\' 6"'$  (1.676 m)   Wt 208 lb (94.3 kg)   SpO2 95%   BMI  33.57 kg/m     Wt Readings from Last 3 Encounters:  11/11/21 208 lb (94.3 kg)  08/28/21 210 lb 9.6 oz (95.5 kg)  03/27/21 214 lb 12.8 oz (97.4 kg)     GEN:  Well nourished, well developed in no acute distress HEENT: Normal NECK: No JVD; No carotid bruits LYMPHATICS: No lymphadenopathy CARDIAC: RRR, no murmurs, no rubs, no gallops RESPIRATORY:  Clear to auscultation without rales, wheezing or rhonchi  ABDOMEN: Soft, non-tender, non-distended MUSCULOSKELETAL:  No edema; No deformity  SKIN: Warm and dry LOWER EXTREMITIES: no swelling NEUROLOGIC:  Alert and oriented x 3 PSYCHIATRIC:  Normal affect   ASSESSMENT:    1. Coronary artery disease of native artery of native heart with stable angina pectoris ([place])   2. Ischemic cardiomyopathy   3. S/P CABG x 4    PLAN:    In order of problems listed above:  Coronary disease stable from that point review he stopped metoprolol because he was making him dizzy.  I will stay away from this medication for now Cardiomyopathy ejection fraction 5055% based on last echocardiogram reviewed from hospital, diastolic function show a relaxation abnormalities, he interesting his proBNP was normal.  He is on small dose of diuretic which I will continue.  I will reduce dose of Lasix to 20 mg daily I will discontinue potassium Chem-7 proBNP will be checked today Status post coronary artery bypass graft doing well from that point review Prostate cancer on hormonal therapy and radiation. I did review record from hospital for that visit   Medication Adjustments/Labs and Tests Ordered: Current medicines are reviewed at length with the patient today.  Concerns regarding medicines are outlined above.  No orders of the defined types were placed in this encounter.  Medication changes: No orders of the defined types were placed in this encounter.   Signed, Park Liter, MD, [facility] 11/11/2021 1:03 PM    [place]

## 2021-11-12 LAB — BASIC METABOLIC PANEL
BUN/Creatinine Ratio: 19 (ref 10–24)
BUN: 22 mg/dL (ref 8–27)
CO2: 22 mmol/L (ref 20–29)
Calcium: 9.5 mg/dL (ref 8.6–10.2)
Chloride: 101 mmol/L (ref 96–106)
Creatinine, Ser: 1.17 mg/dL (ref 0.76–1.27)
Glucose: 126 mg/dL — ABNORMAL HIGH (ref 70–99)
Potassium: 4.2 mmol/L (ref 3.5–5.2)
Sodium: 137 mmol/L (ref 134–144)
eGFR: 66 mL/min/{1.73_m2} (ref >59)

## 2021-11-12 LAB — PRO B NATRIURETIC PEPTIDE: NT-Pro BNP: 144 pg/mL (ref 0–376)

## 2021-11-13 ENCOUNTER — Telehealth: Payer: Self-pay

## 2021-11-13 NOTE — Telephone Encounter (Signed)
Results reviewed with pt as per Dr. Krasowski's note.  Pt verbalized understanding and had no additional questions. Routed to PCP  

## 2021-11-24 ENCOUNTER — Other Ambulatory Visit: Payer: Self-pay | Admitting: Cardiology

## 2021-12-03 DIAGNOSIS — M1712 Unilateral primary osteoarthritis, left knee: Secondary | ICD-10-CM | POA: Diagnosis not present

## 2021-12-08 DIAGNOSIS — C61 Malignant neoplasm of prostate: Secondary | ICD-10-CM | POA: Diagnosis not present

## 2021-12-08 DIAGNOSIS — L570 Actinic keratosis: Secondary | ICD-10-CM | POA: Diagnosis not present

## 2021-12-08 DIAGNOSIS — N3289 Other specified disorders of bladder: Secondary | ICD-10-CM | POA: Diagnosis not present

## 2021-12-08 DIAGNOSIS — M19011 Primary osteoarthritis, right shoulder: Secondary | ICD-10-CM | POA: Diagnosis not present

## 2021-12-08 DIAGNOSIS — L578 Other skin changes due to chronic exposure to nonionizing radiation: Secondary | ICD-10-CM | POA: Diagnosis not present

## 2021-12-08 DIAGNOSIS — L821 Other seborrheic keratosis: Secondary | ICD-10-CM | POA: Diagnosis not present

## 2021-12-12 DIAGNOSIS — R197 Diarrhea, unspecified: Secondary | ICD-10-CM | POA: Diagnosis not present

## 2021-12-15 ENCOUNTER — Ambulatory Visit (INDEPENDENT_AMBULATORY_CARE_PROVIDER_SITE_OTHER): Payer: Medicare Other

## 2021-12-15 DIAGNOSIS — I255 Ischemic cardiomyopathy: Secondary | ICD-10-CM | POA: Diagnosis not present

## 2021-12-15 DIAGNOSIS — I5032 Chronic diastolic (congestive) heart failure: Secondary | ICD-10-CM

## 2021-12-15 LAB — CUP PACEART REMOTE DEVICE CHECK
Date Time Interrogation Session: 20230819232611
Implantable Pulse Generator Implant Date: 20210610

## 2021-12-19 NOTE — Progress Notes (Signed)
Carelink Summary Report / Loop Recorder 

## 2021-12-24 DIAGNOSIS — R2681 Unsteadiness on feet: Secondary | ICD-10-CM | POA: Diagnosis not present

## 2021-12-24 DIAGNOSIS — I25709 Atherosclerosis of coronary artery bypass graft(s), unspecified, with unspecified angina pectoris: Secondary | ICD-10-CM | POA: Diagnosis not present

## 2021-12-24 DIAGNOSIS — Z1331 Encounter for screening for depression: Secondary | ICD-10-CM | POA: Diagnosis not present

## 2021-12-24 DIAGNOSIS — R7301 Impaired fasting glucose: Secondary | ICD-10-CM | POA: Diagnosis not present

## 2021-12-24 DIAGNOSIS — E785 Hyperlipidemia, unspecified: Secondary | ICD-10-CM | POA: Diagnosis not present

## 2021-12-24 DIAGNOSIS — I700 Atherosclerosis of aorta: Secondary | ICD-10-CM | POA: Diagnosis not present

## 2021-12-24 DIAGNOSIS — K219 Gastro-esophageal reflux disease without esophagitis: Secondary | ICD-10-CM | POA: Diagnosis not present

## 2021-12-24 DIAGNOSIS — Z6832 Body mass index (BMI) 32.0-32.9, adult: Secondary | ICD-10-CM | POA: Diagnosis not present

## 2021-12-24 DIAGNOSIS — Z0000 Encounter for general adult medical examination without abnormal findings: Secondary | ICD-10-CM | POA: Diagnosis not present

## 2021-12-24 DIAGNOSIS — G4733 Obstructive sleep apnea (adult) (pediatric): Secondary | ICD-10-CM | POA: Diagnosis not present

## 2021-12-24 DIAGNOSIS — R296 Repeated falls: Secondary | ICD-10-CM | POA: Diagnosis not present

## 2021-12-24 DIAGNOSIS — C61 Malignant neoplasm of prostate: Secondary | ICD-10-CM | POA: Diagnosis not present

## 2021-12-25 DIAGNOSIS — R7301 Impaired fasting glucose: Secondary | ICD-10-CM | POA: Diagnosis not present

## 2021-12-25 DIAGNOSIS — M1712 Unilateral primary osteoarthritis, left knee: Secondary | ICD-10-CM | POA: Diagnosis not present

## 2021-12-29 ENCOUNTER — Encounter: Payer: Self-pay | Admitting: Cardiology

## 2021-12-29 ENCOUNTER — Ambulatory Visit: Payer: Medicare Other | Attending: Cardiology | Admitting: Cardiology

## 2021-12-29 VITALS — BP 116/72 | HR 73 | Ht 66.0 in | Wt 206.0 lb

## 2021-12-29 DIAGNOSIS — I25118 Atherosclerotic heart disease of native coronary artery with other forms of angina pectoris: Secondary | ICD-10-CM | POA: Diagnosis not present

## 2021-12-29 DIAGNOSIS — I10 Essential (primary) hypertension: Secondary | ICD-10-CM | POA: Diagnosis not present

## 2021-12-29 DIAGNOSIS — C61 Malignant neoplasm of prostate: Secondary | ICD-10-CM | POA: Diagnosis not present

## 2021-12-29 DIAGNOSIS — I5032 Chronic diastolic (congestive) heart failure: Secondary | ICD-10-CM | POA: Diagnosis not present

## 2021-12-29 DIAGNOSIS — I255 Ischemic cardiomyopathy: Secondary | ICD-10-CM

## 2021-12-29 DIAGNOSIS — Z951 Presence of aortocoronary bypass graft: Secondary | ICD-10-CM | POA: Insufficient documentation

## 2021-12-29 NOTE — Patient Instructions (Signed)
Medication Instructions:  Your physician recommends that you continue on your current medications as directed. Please refer to the Current Medication list given to you today.  *If you need a refill on your cardiac medications before your next appointment, please call your pharmacy*   Lab Work: Chem 7 - Today If you have labs (blood work) drawn today and your tests are completely normal, you will receive your results only by: [place] (if you have MyChart) OR A paper copy in the mail If you have any lab test that is abnormal or we need to change your treatment, we will call you to review the results.   Testing/Procedures: None Ordered   Follow-Up: At [facility], you and your health needs are our priority.  As part of our continuing mission to provide you with exceptional heart care, we have created designated Provider Care Teams.  These Care Teams include your primary Cardiologist (physician) and Advanced Practice Providers (APPs -  Physician Assistants and Nurse Practitioners) who all work together to provide you with the care you need, when you need it.  We recommend signing up for the patient portal called "MyChart".  Sign up information is provided on this After Visit Summary.  MyChart is used to connect with patients for Virtual Visits (Telemedicine).  Patients are able to view lab/test results, encounter notes, upcoming appointments, etc.  Non-urgent messages can be sent to your provider as well.   To learn more about what you can do with MyChart, go to [email or web address].    Your next appointment:   5 month(s)  The format for your next appointment:   In Person  Provider:   Jenne Campus, MD    Other Instructions NA

## 2021-12-29 NOTE — Progress Notes (Signed)
Cardiology Office Note:    Date:  12/29/2021   ID:  [name], DOB 10-16-1945, MRN [id number]  PCP:  [name], MD  Cardiologist:  Jenne Campus, MD    Referring MD: [name], MD   Chief Complaint  Patient presents with   Medication Management  Doing fine  History of Present Illness:    Ernest Booker is a 76 y.o. male   with past medical history significant for coronary artery bypass graft which was done in 2018, with LIMA to LAD SVG to diagonal SVG to ramus and SVG to PDA.  Then cardiac catheterization done 6 months later showed occlusion of all venous grafts except LIMA to LAD being open.  Since that time he been struggling with episode of angina.  He did have cardiac catheterization in 2021 November which basically showed occluded grafts and no target lesions for intervention.  He also got history of essential hypertension, dyslipidemia..  Recently he was diagnosed to have a prostate cancer he initiated radiation therapy as well as hormonal therapy  Recently he ended up in the [facility] division with swelling of lower extremities, he was appropriately put on diuretic and improved quite significantly.  Last time of seeing him a reduced dose of diuretic however that backfired his leg started swelling again and he is back on 40 mg of Lasix daily.  He said he is doing better yesterday he got better today he developed diarrhea and very weak but no chest pain tightness squeezing pressure burning chest.  He started walking some and he started thinking about fishing again.  Past Medical History:  Diagnosis Date   Acute on chronic diastolic CHF (congestive heart failure) (HCC) 02/25/2020   Angina pectoris (HCC)    Anginal pain (HCC)    Arthritis    Atrial fibrillation ([place]) 09/15/2017   Bilateral numbness and tingling of arms and legs 10/29/2015   CAD (coronary artery disease) 09/28/2016   Cardiogenic shock (HCC)    Cervical disc disease 10/29/2015   Chest pain  at rest 09/15/2017   Chronic diastolic CHF (congestive heart failure) ([place]) 02/25/2020   Chronic kidney disease (CKD), stage III (moderate) ([place]) 09/15/2017   Coronary artery disease    Edema    Encounter for therapeutic drug monitoring 10/14/2016   History of TIA (transient ischemic attack) 07/27/2019   Hx of CABG    Hyperlipemia 09/24/2016   Hyperlipidemia    Hyperlipidemia LDL goal <70    Hypertension    Ischemic cardiomyopathy 10/22/2016   Myocardial infarction [facility])    Neck pain 10/29/2015   Non-ST elevation (NSTEMI) myocardial infarction [facility])    NSTEMI (non-ST elevated myocardial infarction) ([place]) 09/24/2016   Numbness in left leg 10/29/2015   Paroxysmal tachycardia (HCC)    Pleural effusion    Presence of aortocoronary bypass graft 10/08/2016   LIMA to LAD -SVG to DIAG SVG to RAMUS SVG to PDA  All grafts occluded except LIMA based on cardiac catheterization 07/2016   Prostate cancer ([place])    Respiratory failure (HCC)    S/P CABG x 4 10/08/2016   LIMA to LAD -SVG to DIAG SVG to RAMUS SVG to PDA  All grafts occluded except LIMA based on cardiac catheterization 07/2016   Shortness of breath    Weakness 10/29/2015   Weakness of both arms 10/29/2015    Past Surgical History:  Procedure Laterality Date   CORONARY ARTERY BYPASS GRAFT N/A 09/28/2016   Procedure: CORONARY ARTERY BYPASS GRAFTING (CABG) x four, using left  internal mammary artery and right leg greater saphenous vein harvested endoscopically;  Surgeon: Ivin Poot, MD;  Location: [place];  Service: Open Heart Surgery;  Laterality: N/A;   IR THORACENTESIS ASP PLEURAL SPACE W/IMG GUIDE  10/06/2016   LEFT HEART CATH AND CORONARY ANGIOGRAPHY N/A 09/24/2016   Procedure: Left Heart Cath and Coronary Angiography;  Surgeon: Lorretta Harp, MD;  Location: [place] CV LAB;  Service: Cardiovascular;  Laterality: N/A;   LEFT HEART CATH AND CORS/GRAFTS ANGIOGRAPHY N/A 02/11/2017   Procedure: LEFT HEART CATH AND CORS/GRAFTS  ANGIOGRAPHY;  Surgeon: Jettie Booze, MD;  Location: [place] CV LAB;  Service: Cardiovascular;  Laterality: N/A;   LEFT HEART CATH AND CORS/GRAFTS ANGIOGRAPHY N/A 02/27/2020   Procedure: LEFT HEART CATH AND CORS/GRAFTS ANGIOGRAPHY;  Surgeon: Martinique, Peter M, MD;  Location: [place] CV LAB;  Service: Cardiovascular;  Laterality: N/A;   TEE WITHOUT CARDIOVERSION N/A 09/28/2016   Procedure: TRANSESOPHAGEAL ECHOCARDIOGRAM (TEE);  Surgeon: Prescott Gum, Collier Salina, MD;  Location: [place];  Service: Open Heart Surgery;  Laterality: N/A;   ULTRASOUND GUIDANCE FOR VASCULAR ACCESS  02/11/2017   Procedure: Ultrasound Guidance For Vascular Access;  Surgeon: Jettie Booze, MD;  Location: [place] CV LAB;  Service: Cardiovascular;;    Current Medications: Current Meds  Medication Sig   aspirin 81 MG EC tablet Take 1 tablet (81 mg total) by mouth daily.   furosemide (LASIX) 20 MG tablet Take 1 tablet (20 mg total) by mouth daily. (Patient taking differently: Take 40 mg by mouth daily.)   isosorbide mononitrate (IMDUR) 60 MG 24 hr tablet Take 4 tablets (240 mg total) by mouth daily.   nitroGLYCERIN (NITROSTAT) 0.4 MG SL tablet Place 1 tablet (0.4 mg total) under the tongue every 5 (five) minutes as needed for chest pain.   pantoprazole (PROTONIX) 40 MG tablet Take 40 mg by mouth in the morning and at bedtime.    ranolazine (RANEXA) 1000 MG SR tablet TAKE 1  BY MOUTH TWICE DAILY (Patient taking differently: Take 1,000 mg by mouth 2 (two) times daily.)   rivaroxaban (XARELTO) 2.5 MG TABS tablet Take 1 tablet by mouth twice daily (Patient taking differently: Take 2.5 mg by mouth 2 (two) times daily.)   rosuvastatin (CRESTOR) 20 MG tablet TAKE 1 TABLET BY MOUTH ONCE DAILY AT 6 PM (Patient taking differently: Take 20 mg by mouth daily.)   [DISCONTINUED] metoprolol tartrate (LOPRESSOR) 25 MG tablet TAKE 1 TABLET BY MOUTH TWICE A DAY (Patient taking differently: Take 25 mg by mouth 2 (two) times daily.)      Allergies:   Benadryl [diphenhydramine hcl (sleep)] and Levofloxacin   Social History   Socioeconomic History   Marital status: Married    Spouse name: Not on file   Number of children: Not on file   Years of education: Not on file   Highest education level: Not on file  Occupational History   Occupation: Retired  Tobacco Use   Smoking status: Never   Smokeless tobacco: Never  Vaping Use   Vaping Use: Never used  Substance and Sexual Activity   Alcohol use: No   Drug use: No   Sexual activity: Yes    Birth control/protection: None  Other Topics Concern   Not on file  Social History Narrative   Not on file   Social Determinants of Health   Financial Resource Strain: Not on file  Food Insecurity: Not on file  Transportation Needs: Not on file  Physical Activity:  Not on file  Stress: Not on file  Social Connections: Not on file     Family History: The patient's family history includes Heart attack in his father; Heart failure in his mother. ROS:   Please see the history of present illness.    All 14 point review of systems negative except as described per history of present illness  EKGs/Labs/Other Studies Reviewed:      Recent Labs: 11/11/2021: BUN 22; Creatinine, Ser 1.17; NT-Pro BNP 144; Potassium 4.2; Sodium 137  Recent Lipid Panel    Component Value Date/Time   CHOL 125 10/25/2020 0958   TRIG 111 10/25/2020 0958   HDL 53 10/25/2020 0958   CHOLHDL 2.4 10/25/2020 0958   CHOLHDL 2.8 02/27/2020 0343   VLDL 19 02/27/2020 0343   LDLCALC 52 10/25/2020 0958    Physical Exam:    VS:  BP 116/72 (BP Location: Left Arm, Patient Position: Sitting)   Pulse 73   Ht '5\' 6"'$  (1.676 m)   Wt 206 lb (93.4 kg)   SpO2 97%   BMI 33.25 kg/m     Wt Readings from Last 3 Encounters:  12/29/21 206 lb (93.4 kg)  11/11/21 208 lb (94.3 kg)  08/28/21 210 lb 9.6 oz (95.5 kg)     GEN:  Well nourished, well developed in no acute distress HEENT: Normal NECK: No JVD; No  carotid bruits LYMPHATICS: No lymphadenopathy CARDIAC: RRR, no murmurs, no rubs, no gallops RESPIRATORY:  Clear to auscultation without rales, wheezing or rhonchi  ABDOMEN: Soft, non-tender, non-distended MUSCULOSKELETAL:  No edema; No deformity  SKIN: Warm and dry LOWER EXTREMITIES: no swelling NEUROLOGIC:  Alert and oriented x 3 PSYCHIATRIC:  Normal affect   ASSESSMENT:    1. Ischemic cardiomyopathy   2. Coronary artery disease of native artery of native heart with stable angina pectoris ([place])   3. Chronic diastolic CHF (congestive heart failure) ([place])   4. Primary hypertension   5. Prostate cancer ([place])   6. S/P CABG x 4    PLAN:    In order of problems listed above:  Ischemic cardiomyopathy.  Last left ventricle ejection fraction 50 to 55% we will continue present management. Coronary artery disease status post coronary artery bypass graft with occluded 3 out of 4 grafts.  Stable.  Stable angina pectoris Diastolic congestive heart failure compensated.  His primary care physician adjusted Chem-7 last week.  We will call her and get copy of it. Essential hypertension blood pressure well controlled Prostate CAD is being followed by primary care physician   Medication Adjustments/Labs and Tests Ordered: Current medicines are reviewed at length with the patient today.  Concerns regarding medicines are outlined above.  No orders of the defined types were placed in this encounter.  Medication changes: No orders of the defined types were placed in this encounter.   Signed, Park Liter, MD, [facility] 12/29/2021 9:06 AM    [place]

## 2021-12-29 NOTE — Addendum Note (Signed)
Addended by: Jacobo Forest D on: 12/29/2021 09:18 AM   Modules accepted: Orders

## 2021-12-30 LAB — BASIC METABOLIC PANEL
BUN/Creatinine Ratio: 22 (ref 10–24)
BUN: 24 mg/dL (ref 8–27)
CO2: 24 mmol/L (ref 20–29)
Calcium: 9.1 mg/dL (ref 8.6–10.2)
Chloride: 102 mmol/L (ref 96–106)
Creatinine, Ser: 1.1 mg/dL (ref 0.76–1.27)
Glucose: 142 mg/dL — ABNORMAL HIGH (ref 70–99)
Potassium: 3.9 mmol/L (ref 3.5–5.2)
Sodium: 139 mmol/L (ref 134–144)
eGFR: 71 mL/min/{1.73_m2} (ref >59)

## 2022-01-01 DIAGNOSIS — M1712 Unilateral primary osteoarthritis, left knee: Secondary | ICD-10-CM | POA: Diagnosis not present

## 2022-01-07 ENCOUNTER — Telehealth: Payer: Self-pay

## 2022-01-07 DIAGNOSIS — M1712 Unilateral primary osteoarthritis, left knee: Secondary | ICD-10-CM | POA: Diagnosis not present

## 2022-01-07 NOTE — Telephone Encounter (Signed)
Patient notified of results.

## 2022-01-07 NOTE — Telephone Encounter (Signed)
-----   Message from Park Liter, MD sent at 01/06/2022  7:48 PM EDT ----- Chem-7 looks good, continue present management

## 2022-01-08 NOTE — Progress Notes (Signed)
Carelink Summary Report / Loop Recorder 

## 2022-01-09 ENCOUNTER — Ambulatory Visit (INDEPENDENT_AMBULATORY_CARE_PROVIDER_SITE_OTHER): Payer: Medicare Other

## 2022-01-09 DIAGNOSIS — I255 Ischemic cardiomyopathy: Secondary | ICD-10-CM

## 2022-01-11 DIAGNOSIS — Z20822 Contact with and (suspected) exposure to covid-19: Secondary | ICD-10-CM | POA: Diagnosis not present

## 2022-01-11 DIAGNOSIS — J22 Unspecified acute lower respiratory infection: Secondary | ICD-10-CM | POA: Diagnosis not present

## 2022-01-12 LAB — CUP PACEART REMOTE DEVICE CHECK
Date Time Interrogation Session: 20230921233128
Implantable Pulse Generator Implant Date: 20210610

## 2022-01-16 NOTE — Progress Notes (Signed)
Carelink Summary Report / Loop Recorder 

## 2022-01-20 IMAGING — PT NM PET TUM IMG SKULL BASE T - THIGH
1 series · 2 of 2 positions shown · non-contrast
Comparison: CT of the chest of Sunday July, 2020.

CLINICAL DATA: New diagnosis of prostate cancer in a 75-year-old
male with elevated PSA to 23.6.

EXAM:
NUCLEAR MEDICINE PET SKULL BASE TO THIGH
TECHNIQUE: 9.0 mCi F18 Piflufolastat (Pylarify) was injected intravenously.
Full-ring PET imaging was performed from the skull base to thigh
after the radiotracer. CT data was obtained and used for attenuation
correction and anatomic localization.

[Series 1062: results mm oncology reading · 1.0mm · 0.45mm/px · 2 of 2 slices shown]
[im 1/2]
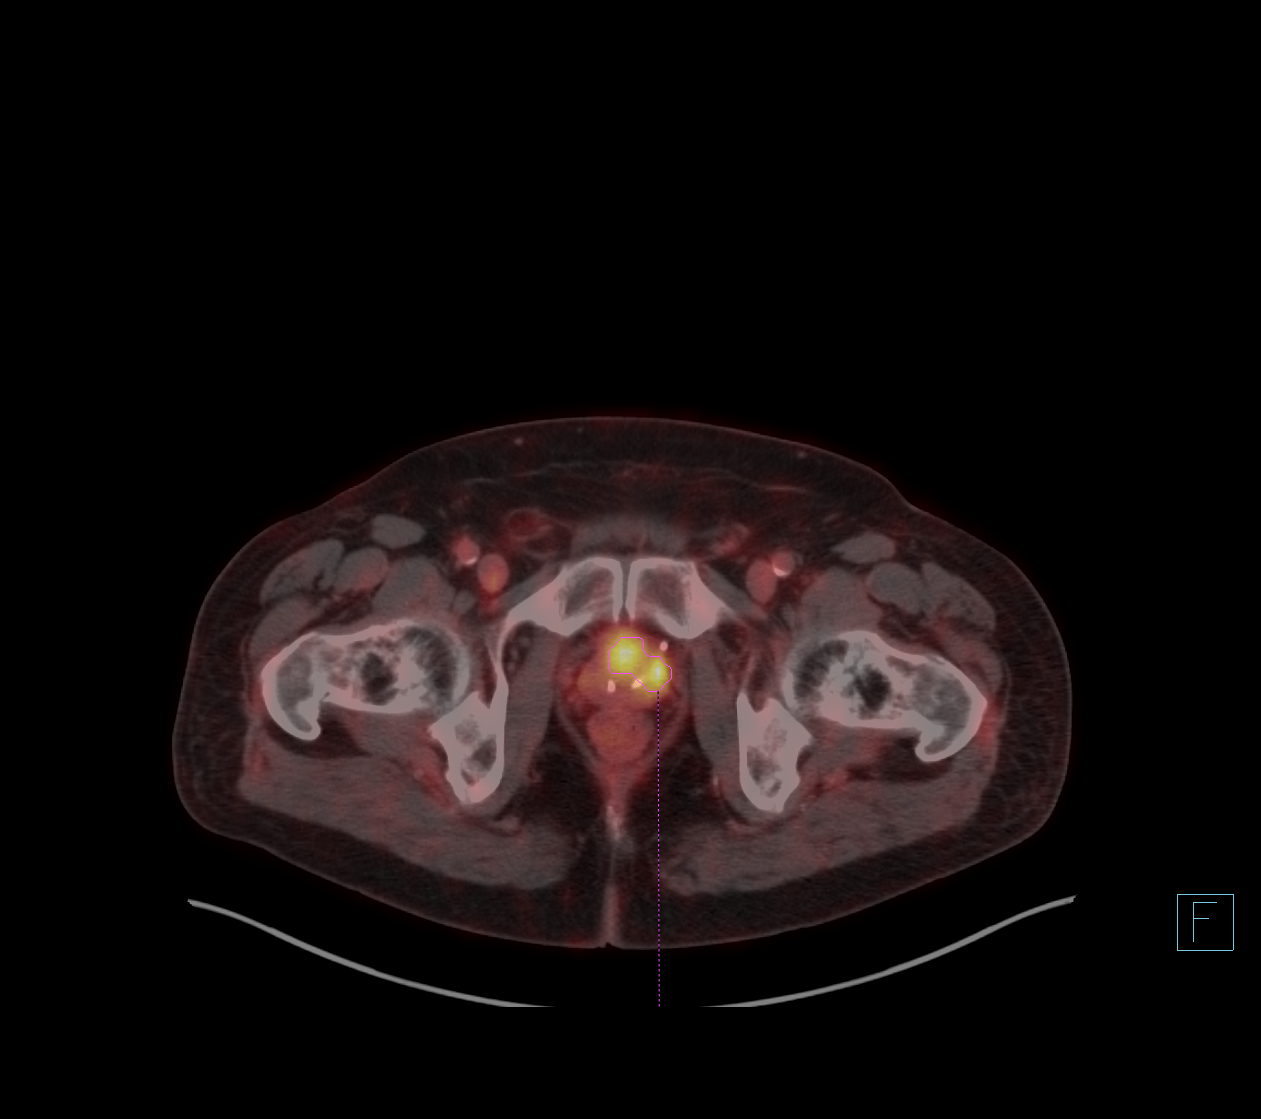
[im 2/2]
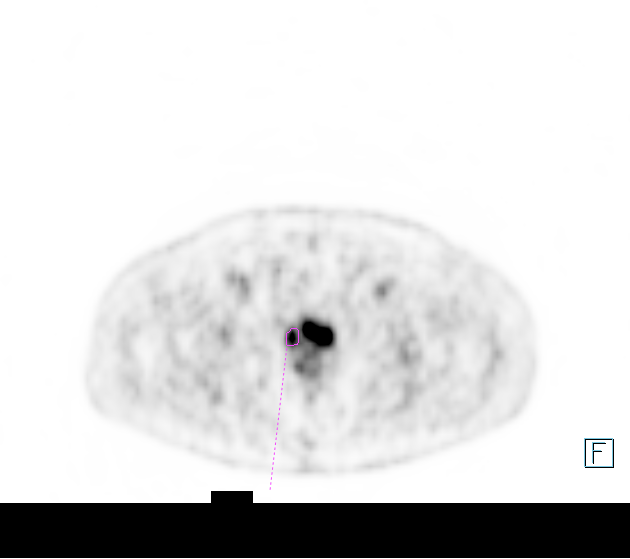

[2 of 2 positions shown; findings below may reference images not displayed]

FINDINGS: NECK

No radiotracer activity in neck lymph nodes.

Incidental CT finding: None

CHEST

No areas of abnormal radiotracer accumulation, no suspicious
pulmonary nodules.

Incidental CT finding: Signs of median sternotomy for CABG.
Scattered aortic atherosclerosis. Three-vessel coronary artery
disease. No adenopathy by size criteria in the chest. Lungs are
clear aside from minimal basilar atelectasis. No effusion. Airways
are patent.

ABDOMEN/PELVIS

Prostate: Radiotracer accumulation in the LEFT and anterior prostate
with maximum SUV of 12.2. Radiotracer accumulation in the RIGHT
prostate gland with a maximum SUV of 6.5.

Lymph nodes: No abnormal radiotracer accumulation within pelvic or
abdominal nodes.

Liver: No evidence of liver metastasis

Incidental CT finding: No acute findings relative to liver,
gallbladder, spleen, pancreas, adrenal glands or kidneys. Small
hiatal hernia colonic diverticulosis. No adenopathy by size criteria
in the abdomen or in the pelvis. Aortic atherosclerosis without
aneurysm.

SKELETON

No focal  activity to suggest skeletal metastasis.
IMPRESSION: Marked radiotracer accumulation in the LEFT greater than RIGHT
prostate gland likely corresponding to the patient's known prostate
malignancy.

No evidence of prostate cancer metastases in the neck, chest,
abdomen or in the pelvis.

Findings the generalized atherosclerotic changes of the thoracic and
abdominal aorta and coronary artery disease post median sternotomy.

Colonic diverticulosis.

Aortic Atherosclerosis ([id number]).

## 2022-01-22 DIAGNOSIS — R0600 Dyspnea, unspecified: Secondary | ICD-10-CM | POA: Diagnosis not present

## 2022-01-22 DIAGNOSIS — R051 Acute cough: Secondary | ICD-10-CM | POA: Diagnosis not present

## 2022-01-22 DIAGNOSIS — R509 Fever, unspecified: Secondary | ICD-10-CM | POA: Diagnosis not present

## 2022-01-22 DIAGNOSIS — U071 COVID-19: Secondary | ICD-10-CM | POA: Diagnosis not present

## 2022-02-02 ENCOUNTER — Other Ambulatory Visit: Payer: Self-pay | Admitting: Cardiology

## 2022-02-02 NOTE — Telephone Encounter (Signed)
Rx refill sent to pharmacy. 

## 2022-02-04 DIAGNOSIS — R2689 Other abnormalities of gait and mobility: Secondary | ICD-10-CM | POA: Diagnosis not present

## 2022-02-04 DIAGNOSIS — M6281 Muscle weakness (generalized): Secondary | ICD-10-CM | POA: Diagnosis not present

## 2022-02-09 DIAGNOSIS — M6281 Muscle weakness (generalized): Secondary | ICD-10-CM | POA: Diagnosis not present

## 2022-02-09 DIAGNOSIS — R2689 Other abnormalities of gait and mobility: Secondary | ICD-10-CM | POA: Diagnosis not present

## 2022-02-11 ENCOUNTER — Ambulatory Visit (INDEPENDENT_AMBULATORY_CARE_PROVIDER_SITE_OTHER): Payer: Medicare Other

## 2022-02-11 DIAGNOSIS — I255 Ischemic cardiomyopathy: Secondary | ICD-10-CM

## 2022-02-11 LAB — CUP PACEART REMOTE DEVICE CHECK
Date Time Interrogation Session: 20231024233201
Implantable Pulse Generator Implant Date: 20210610

## 2022-02-12 DIAGNOSIS — R2689 Other abnormalities of gait and mobility: Secondary | ICD-10-CM | POA: Diagnosis not present

## 2022-02-12 DIAGNOSIS — M6281 Muscle weakness (generalized): Secondary | ICD-10-CM | POA: Diagnosis not present

## 2022-02-13 ENCOUNTER — Other Ambulatory Visit: Payer: Self-pay | Admitting: Cardiology

## 2022-02-13 NOTE — Telephone Encounter (Signed)
Rosuvastatin refill sent to pharmacy

## 2022-02-16 DIAGNOSIS — M6281 Muscle weakness (generalized): Secondary | ICD-10-CM | POA: Diagnosis not present

## 2022-02-16 DIAGNOSIS — R2689 Other abnormalities of gait and mobility: Secondary | ICD-10-CM | POA: Diagnosis not present

## 2022-02-18 DIAGNOSIS — M85851 Other specified disorders of bone density and structure, right thigh: Secondary | ICD-10-CM | POA: Diagnosis not present

## 2022-02-23 DIAGNOSIS — R2689 Other abnormalities of gait and mobility: Secondary | ICD-10-CM | POA: Diagnosis not present

## 2022-02-23 DIAGNOSIS — M6281 Muscle weakness (generalized): Secondary | ICD-10-CM | POA: Diagnosis not present

## 2022-02-24 NOTE — Progress Notes (Signed)
Carelink Summary Report / Loop Recorder 

## 2022-02-25 DIAGNOSIS — R5383 Other fatigue: Secondary | ICD-10-CM | POA: Diagnosis not present

## 2022-02-25 DIAGNOSIS — Z79899 Other long term (current) drug therapy: Secondary | ICD-10-CM | POA: Diagnosis not present

## 2022-03-02 DIAGNOSIS — R2689 Other abnormalities of gait and mobility: Secondary | ICD-10-CM | POA: Diagnosis not present

## 2022-03-02 DIAGNOSIS — M6281 Muscle weakness (generalized): Secondary | ICD-10-CM | POA: Diagnosis not present

## 2022-03-04 DIAGNOSIS — M6281 Muscle weakness (generalized): Secondary | ICD-10-CM | POA: Diagnosis not present

## 2022-03-04 DIAGNOSIS — R2689 Other abnormalities of gait and mobility: Secondary | ICD-10-CM | POA: Diagnosis not present

## 2022-03-07 ENCOUNTER — Other Ambulatory Visit: Payer: Self-pay | Admitting: Cardiology

## 2022-03-09 NOTE — Telephone Encounter (Signed)
Refill to pharmacy 

## 2022-03-10 DIAGNOSIS — J00 Acute nasopharyngitis [common cold]: Secondary | ICD-10-CM | POA: Diagnosis not present

## 2022-03-13 DIAGNOSIS — M25811 Other specified joint disorders, right shoulder: Secondary | ICD-10-CM | POA: Diagnosis not present

## 2022-03-14 DIAGNOSIS — R079 Chest pain, unspecified: Secondary | ICD-10-CM | POA: Diagnosis not present

## 2022-03-14 DIAGNOSIS — R9431 Abnormal electrocardiogram [ECG] [EKG]: Secondary | ICD-10-CM | POA: Diagnosis not present

## 2022-03-14 DIAGNOSIS — M19011 Primary osteoarthritis, right shoulder: Secondary | ICD-10-CM | POA: Diagnosis not present

## 2022-03-14 DIAGNOSIS — I200 Unstable angina: Secondary | ICD-10-CM | POA: Diagnosis not present

## 2022-03-14 DIAGNOSIS — I10 Essential (primary) hypertension: Secondary | ICD-10-CM | POA: Diagnosis not present

## 2022-03-14 DIAGNOSIS — I25810 Atherosclerosis of coronary artery bypass graft(s) without angina pectoris: Secondary | ICD-10-CM | POA: Diagnosis not present

## 2022-03-14 DIAGNOSIS — M25511 Pain in right shoulder: Secondary | ICD-10-CM | POA: Diagnosis not present

## 2022-03-14 DIAGNOSIS — I25118 Atherosclerotic heart disease of native coronary artery with other forms of angina pectoris: Secondary | ICD-10-CM | POA: Diagnosis not present

## 2022-03-16 ENCOUNTER — Ambulatory Visit (INDEPENDENT_AMBULATORY_CARE_PROVIDER_SITE_OTHER): Payer: Medicare Other

## 2022-03-16 DIAGNOSIS — I639 Cerebral infarction, unspecified: Secondary | ICD-10-CM

## 2022-03-16 DIAGNOSIS — Z79899 Other long term (current) drug therapy: Secondary | ICD-10-CM | POA: Diagnosis not present

## 2022-03-16 DIAGNOSIS — M85859 Other specified disorders of bone density and structure, unspecified thigh: Secondary | ICD-10-CM | POA: Diagnosis not present

## 2022-03-16 DIAGNOSIS — R5383 Other fatigue: Secondary | ICD-10-CM | POA: Diagnosis not present

## 2022-03-16 DIAGNOSIS — C61 Malignant neoplasm of prostate: Secondary | ICD-10-CM | POA: Diagnosis not present

## 2022-03-16 DIAGNOSIS — N3289 Other specified disorders of bladder: Secondary | ICD-10-CM | POA: Diagnosis not present

## 2022-03-16 DIAGNOSIS — R531 Weakness: Secondary | ICD-10-CM | POA: Diagnosis not present

## 2022-03-16 DIAGNOSIS — Z6833 Body mass index (BMI) 33.0-33.9, adult: Secondary | ICD-10-CM | POA: Diagnosis not present

## 2022-03-16 DIAGNOSIS — M2550 Pain in unspecified joint: Secondary | ICD-10-CM | POA: Diagnosis not present

## 2022-03-16 DIAGNOSIS — M791 Myalgia, unspecified site: Secondary | ICD-10-CM | POA: Diagnosis not present

## 2022-03-16 DIAGNOSIS — R410 Disorientation, unspecified: Secondary | ICD-10-CM | POA: Diagnosis not present

## 2022-03-17 LAB — CUP PACEART REMOTE DEVICE CHECK
Date Time Interrogation Session: 20231126233848
Implantable Pulse Generator Implant Date: 20210610

## 2022-03-27 DIAGNOSIS — M19011 Primary osteoarthritis, right shoulder: Secondary | ICD-10-CM | POA: Diagnosis not present

## 2022-03-31 NOTE — Progress Notes (Deleted)
Initial neurology clinic note  SERVICE DATE: 04/02/22  Reason for Evaluation: Consultation requested by [name], MD for an opinion regarding generalized weakness. My final recommendations will be communicated back to the requesting physician by way of shared medical record or letter to requesting physician via [place] mail.  HPI: This is Mr. Ernest Booker, a 76 y.o. ***-handed male with a medical history of HTN, HLD, CAD s/p CABG, CHF, DM, OA, OSA (***), prostate cancer who presents to neurology clinic with the chief complaint of generalized weakness. The patient is accompanied by ***.  *** Whole body weakness for a while Tired, exhausted, poor appetite Confused Feet burning  Sleep: *** Mood: ***  The patient has not*** had similar episodes of symptoms in the past. ***  Muscle bulk loss? *** Muscle pain? ***  Cramps/Twitching? *** Suggestion of myotonia/difficulty relaxing after contraction? ***  Fatigable weakness?*** Does strength improve after brief exercise?***  Able to brush hair/teeth without difficulty? *** Able to button shirts/use zips? *** Clumsiness/dropping grasped objects?*** Can you arise from squatted position easily? *** Able to get out of chair without using arms? *** Able to walk up steps easily? *** Use an assistive device to walk? *** Significant imbalance with walking? *** Falls?*** Any change in urine color, especially after exertion/physical activity? ***  The patient denies*** symptoms suggestive of oculobulbar weakness including diplopia, ptosis, dysphagia, poor saliva control, dysarthria/dysphonia, impaired mastication, facial weakness/droop.  There are no*** neuromuscular respiratory weakness symptoms, particularly orthopnea>dyspnea.   Pseudobulbar affect is absent***.  The patient does not*** report symptoms referable to autonomic dysfunction including impaired sweating, heat or cold intolerance, excessive mucosal dryness, gastroparetic early  satiety, postprandial abdominal bloating, constipation, bowel or bladder dyscontrol, erectile dysfunction*** or syncope/presyncope/orthostatic intolerance.  There are no*** complaints relating to other symptoms of small fiber modalities including paresthesia/pain.  The patient has not *** noticed any recent skin rashes nor does he*** report any constitutional symptoms like fever, night sweats, anorexia or unintentional weight loss.  EtOH use: ***  Restrictive diet? *** Family history of neuropathy/myopathy/NM disease?***  Previous labs, electrodiagnostics, and neuroimaging are summarized below, but pertinent findings include***  Any biopsy done? *** Current medications being tried for the patient's symptoms include ***  Prior medications that have been tried: ***   MEDICATIONS:  Outpatient Encounter Medications as of 04/02/2022  Medication Sig   aspirin 81 MG EC tablet Take 1 tablet (81 mg total) by mouth daily.   furosemide (LASIX) 40 MG tablet Take 1 tablet (40 mg total) by mouth daily. TAKE 1 TABLET BY MOUTH EVERY DAY   isosorbide mononitrate (IMDUR) 60 MG 24 hr tablet Take 4 tablets (240 mg total) by mouth daily.   nitroGLYCERIN (NITROSTAT) 0.4 MG SL tablet Place 1 tablet (0.4 mg total) under the tongue every 5 (five) minutes as needed for chest pain.   pantoprazole (PROTONIX) 40 MG tablet Take 40 mg by mouth in the morning and at bedtime.    potassium chloride (KLOR-CON) 10 MEQ tablet Take 1 tablet (10 mEq total) by mouth daily. (Patient not taking: Reported on 12/29/2021)   ranolazine (RANEXA) 1000 MG SR tablet TAKE 1  BY MOUTH TWICE DAILY   rivaroxaban (XARELTO) 2.5 MG TABS tablet Take 1 tablet by mouth twice daily (Patient taking differently: Take 2.5 mg by mouth 2 (two) times daily.)   rosuvastatin (CRESTOR) 20 MG tablet Take 1 tablet (20 mg total) by mouth daily.   No facility-administered encounter medications on file as of 04/02/2022.  PAST MEDICAL HISTORY: Past Medical  History:  Diagnosis Date   Acute on chronic diastolic CHF (congestive heart failure) (HCC) 02/25/2020   Angina pectoris (HCC)    Anginal pain (HCC)    Arthritis    Atrial fibrillation (HCC) 09/15/2017   Bilateral numbness and tingling of arms and legs 10/29/2015   CAD (coronary artery disease) 09/28/2016   Cardiogenic shock (HCC)    Cervical disc disease 10/29/2015   Chest pain at rest 09/15/2017   Chronic diastolic CHF (congestive heart failure) ([place]) 02/25/2020   Chronic kidney disease (CKD), stage III (moderate) ([facility] [place] [facility]) 09/15/2017   Coronary artery disease    Edema    Encounter for therapeutic drug monitoring 10/14/2016   History of TIA (transient ischemic attack) 07/27/2019   Hx of CABG    Hyperlipemia 09/24/2016   Hyperlipidemia    Hyperlipidemia LDL goal <70    Hypertension    Ischemic cardiomyopathy 10/22/2016   Myocardial infarction [facility])    Neck pain 10/29/2015   Non-ST elevation (NSTEMI) myocardial infarction [facility])    NSTEMI (non-ST elevated myocardial infarction) ([place]) 09/24/2016   Numbness in left leg 10/29/2015   Paroxysmal tachycardia (HCC)    Pleural effusion    Presence of aortocoronary bypass graft 10/08/2016   LIMA to LAD -SVG to DIAG SVG to RAMUS SVG to PDA  All grafts occluded except LIMA based on cardiac catheterization 07/2016   Prostate cancer ([place])    Respiratory failure (HCC)    S/P CABG x 4 10/08/2016   LIMA to LAD -SVG to DIAG SVG to RAMUS SVG to PDA  All grafts occluded except LIMA based on cardiac catheterization 07/2016   Shortness of breath    Weakness 10/29/2015   Weakness of both arms 10/29/2015    PAST SURGICAL HISTORY: Past Surgical History:  Procedure Laterality Date   CORONARY ARTERY BYPASS GRAFT N/A 09/28/2016   Procedure: CORONARY ARTERY BYPASS GRAFTING (CABG) x four, using left internal mammary artery and right leg greater saphenous vein harvested endoscopically;  Surgeon: Ivin Poot, MD;  Location: [facility];  Service: Open Heart  Surgery;  Laterality: N/A;   IR THORACENTESIS ASP PLEURAL SPACE W/IMG GUIDE  10/06/2016   LEFT HEART CATH AND CORONARY ANGIOGRAPHY N/A 09/24/2016   Procedure: Left Heart Cath and Coronary Angiography;  Surgeon: Lorretta Harp, MD;  Location: [place] [facility];  Service: Cardiovascular;  Laterality: N/A;   LEFT HEART CATH AND CORS/GRAFTS ANGIOGRAPHY N/A 02/11/2017   Procedure: LEFT HEART CATH AND CORS/GRAFTS ANGIOGRAPHY;  Surgeon: Jettie Booze, MD;  Location: [place] [facility];  Service: Cardiovascular;  Laterality: N/A;   LEFT HEART CATH AND CORS/GRAFTS ANGIOGRAPHY N/A 02/27/2020   Procedure: LEFT HEART CATH AND CORS/GRAFTS ANGIOGRAPHY;  Surgeon: Martinique, Peter M, MD;  Location: [place] [facility];  Service: Cardiovascular;  Laterality: N/A;   TEE WITHOUT CARDIOVERSION N/A 09/28/2016   Procedure: TRANSESOPHAGEAL ECHOCARDIOGRAM (TEE);  Surgeon: Prescott Gum, Collier Salina, MD;  Location: [place];  Service: Open Heart Surgery;  Laterality: N/A;   ULTRASOUND GUIDANCE FOR VASCULAR ACCESS  02/11/2017   Procedure: Ultrasound Guidance For Vascular Access;  Surgeon: Jettie Booze, MD;  Location: [place] [facility];  Service: Cardiovascular;;    ALLERGIES: Allergies  Allergen Reactions   Benadryl [Diphenhydramine Hcl (Sleep)] Other (See Comments)    Reaction not recalled   Levofloxacin Other (See Comments)    Tendon rupture     FAMILY HISTORY: Family History  Problem Relation Age of Onset   Heart failure  Mother    Heart attack Father     SOCIAL HISTORY: Social History   Tobacco Use   Smoking status: Never   Smokeless tobacco: Never  Vaping Use   Vaping Use: Never used  Substance Use Topics   Alcohol use: No   Drug use: No   Social History   Social History Narrative   Not on file     OBJECTIVE: PHYSICAL EXAM: There were no vitals taken for this visit.  General:*** General appearance: Awake and alert. No distress. Cooperative with exam.  Skin: No obvious rash or  jaundice. HEENT: Atraumatic. Anicteric. Lungs: Non-labored breathing on room air  Heart: Regular Abdomen: Soft, non tender. Extremities: No edema. No obvious deformity.  Musculoskeletal: No obvious joint swelling. Psych: Affect appropriate.  Neurological: Mental Status: Alert. Speech fluent. No pseudobulbar affect Cranial Nerves: CNII: No RAPD. Visual fields grossly intact. CNIII, IV, VI: PERRL. No nystagmus. EOMI. CN V: Facial sensation intact bilaterally to fine touch. [place] clench strong. Jaw jerk***. CN VII: Facial muscles symmetric and strong. No ptosis at rest or after sustained upgaze***. CN VIII: Hearing grossly intact bilaterally. CN IX: No hypophonia. CN X: Palate elevates symmetrically. CN XI: Full strength shoulder shrug bilaterally. CN XII: Tongue protrusion full and midline. No atrophy or fasciculations. No significant dysarthria*** Motor: Tone is ***. *** fasciculations in *** extremities. *** atrophy. No grip or percussive myotonia.***  Individual muscle group testing (MRC grade out of 5):  Movement     Neck flexion ***    Neck extension ***     Right Left   Shoulder abduction *** ***   Shoulder adduction *** ***   Shoulder ext rotation *** ***   Shoulder int rotation *** ***   Elbow flexion *** ***   Elbow extension *** ***   Wrist extension *** ***   Wrist flexion *** ***   Finger abduction - FDI *** ***   Finger abduction - ADM *** ***   Finger extension *** ***   Finger distal flexion - 2/3 *** ***   Finger distal flexion - 4/5 *** ***   Thumb flexion - FPL *** ***   Thumb abduction - APB *** ***    Hip flexion *** ***   Hip extension *** ***   Hip adduction *** ***   Hip abduction *** ***   Knee extension *** ***   Knee flexion *** ***   Dorsiflexion *** ***   Plantarflexion *** ***   Inversion *** ***   Eversion *** ***   Great toe extension *** ***   Great toe flexion *** ***     Reflexes:  Right Left   Bicep *** ***    Tricep *** ***   BrRad *** ***   Knee *** ***   Ankle *** ***    Pathological Reflexes: Babinski: *** response bilaterally*** Hoffman: *** Troemner: *** Pectoral: *** Palmomental: *** Facial: *** Midline tap: *** Sensation: Pinprick: *** Vibration: *** Temperature: *** Proprioception: *** Coordination: Intact finger-to- nose-finger bilaterally. Romberg negative.*** Gait: Able to rise from chair with arms crossed unassisted. Normal, narrow-based gait. Able to tandem walk. Able to walk on toes and heels.***  Lab and Test Review: Internal labs: TSH (02/25/20): 1.771 ***  External labs: Normal or unremarkable: CBC, CMP, ESR, CRP, ANA, CK (59) Vit D (03/17/22): low at 23.3 ***  ***  ASSESSMENT: Ernest Booker is a 76 y.o. male who presents for evaluation of ***. *** has a relevant medical history of ***. ***  neurological examination is pertinent for ***. Available diagnostic data is significant for ***. This constellation of symptoms and objective data would most likely localize to ***. ***  PLAN: -Blood work: ***TSH, B1, B12 ***  -Return to clinic ***  The impression above as well as the plan as outlined below were extensively discussed with the patient (in the company of ***) who voiced understanding. All questions were answered to their satisfaction.  The patient was counseled on pertinent fall precautions per the printed material provided today, and as noted under the "Patient Instructions" section below.***  When available, results of the above investigations and possible further recommendations will be communicated to the patient via telephone/MyChart. Patient to call office if not contacted after expected testing turnaround time.   Total time spent reviewing records, interview, history/exam, documentation, and coordination of care on day of encounter:  *** min   Thank you for allowing me to participate in patient's care.  If I can answer any additional questions, I  would be pleased to do so.  Kai Levins, MD   CC: [name], MD [address] [place] [address]  CC: Referring provider: [name], MD [address] [place],  [place] [address]

## 2022-04-01 DIAGNOSIS — Z7984 Long term (current) use of oral hypoglycemic drugs: Secondary | ICD-10-CM | POA: Diagnosis not present

## 2022-04-01 DIAGNOSIS — R7303 Prediabetes: Secondary | ICD-10-CM | POA: Diagnosis not present

## 2022-04-01 DIAGNOSIS — H26493 Other secondary cataract, bilateral: Secondary | ICD-10-CM | POA: Diagnosis not present

## 2022-04-02 ENCOUNTER — Ambulatory Visit: Payer: Medicare Other | Admitting: Neurology

## 2022-04-02 ENCOUNTER — Encounter: Payer: Self-pay | Admitting: Neurology

## 2022-04-02 DIAGNOSIS — Z20822 Contact with and (suspected) exposure to covid-19: Secondary | ICD-10-CM | POA: Diagnosis not present

## 2022-04-02 DIAGNOSIS — Z029 Encounter for administrative examinations, unspecified: Secondary | ICD-10-CM

## 2022-04-02 DIAGNOSIS — J09X2 Influenza due to identified novel influenza A virus with other respiratory manifestations: Secondary | ICD-10-CM | POA: Diagnosis not present

## 2022-04-04 DIAGNOSIS — J111 Influenza due to unidentified influenza virus with other respiratory manifestations: Secondary | ICD-10-CM | POA: Diagnosis not present

## 2022-04-04 DIAGNOSIS — R0602 Shortness of breath: Secondary | ICD-10-CM | POA: Diagnosis not present

## 2022-04-04 DIAGNOSIS — I252 Old myocardial infarction: Secondary | ICD-10-CM | POA: Diagnosis not present

## 2022-04-21 ENCOUNTER — Ambulatory Visit (INDEPENDENT_AMBULATORY_CARE_PROVIDER_SITE_OTHER): Payer: Medicare Other

## 2022-04-21 ENCOUNTER — Telehealth: Payer: Self-pay

## 2022-04-21 DIAGNOSIS — I639 Cerebral infarction, unspecified: Secondary | ICD-10-CM

## 2022-04-21 LAB — CUP PACEART REMOTE DEVICE CHECK
Date Time Interrogation Session: 20240101231916
Implantable Pulse Generator Implant Date: 20210610

## 2022-04-21 NOTE — Telephone Encounter (Signed)
Alert received from [employer]:  ILR summary report received. Battery status OK. Normal device function. No new symptom, tachy, brady, or pause episodes. 2 AF episodes logged, no events received. due to gap in monitoring. Will need manual transmission for retrieval of episodes if actionable. [place]- Xarelto.    Outreach made to Pt.  Phone rang-did not go to VM.

## 2022-04-22 NOTE — Telephone Encounter (Signed)
Mychart message sent.

## 2022-04-24 NOTE — Telephone Encounter (Signed)
Called patient had patient send manual transmission, AF episodes appear to be noise with oversensing/ PVCs noisy baseline

## 2022-04-28 NOTE — Progress Notes (Signed)
Carelink Summary Report / Loop Recorder 

## 2022-04-29 DIAGNOSIS — R5383 Other fatigue: Secondary | ICD-10-CM | POA: Diagnosis not present

## 2022-04-29 DIAGNOSIS — I25709 Atherosclerosis of coronary artery bypass graft(s), unspecified, with unspecified angina pectoris: Secondary | ICD-10-CM | POA: Diagnosis not present

## 2022-04-29 DIAGNOSIS — Z6832 Body mass index (BMI) 32.0-32.9, adult: Secondary | ICD-10-CM | POA: Diagnosis not present

## 2022-04-29 DIAGNOSIS — R7303 Prediabetes: Secondary | ICD-10-CM | POA: Diagnosis not present

## 2022-04-29 DIAGNOSIS — I700 Atherosclerosis of aorta: Secondary | ICD-10-CM | POA: Diagnosis not present

## 2022-04-29 DIAGNOSIS — E785 Hyperlipidemia, unspecified: Secondary | ICD-10-CM | POA: Diagnosis not present

## 2022-04-29 DIAGNOSIS — C61 Malignant neoplasm of prostate: Secondary | ICD-10-CM | POA: Diagnosis not present

## 2022-04-29 DIAGNOSIS — G4733 Obstructive sleep apnea (adult) (pediatric): Secondary | ICD-10-CM | POA: Diagnosis not present

## 2022-04-29 DIAGNOSIS — K219 Gastro-esophageal reflux disease without esophagitis: Secondary | ICD-10-CM | POA: Diagnosis not present

## 2022-05-02 DIAGNOSIS — J22 Unspecified acute lower respiratory infection: Secondary | ICD-10-CM | POA: Diagnosis not present

## 2022-05-02 DIAGNOSIS — I10 Essential (primary) hypertension: Secondary | ICD-10-CM | POA: Diagnosis not present

## 2022-05-02 DIAGNOSIS — R051 Acute cough: Secondary | ICD-10-CM | POA: Diagnosis not present

## 2022-05-20 DIAGNOSIS — E559 Vitamin D deficiency, unspecified: Secondary | ICD-10-CM | POA: Diagnosis not present

## 2022-05-20 DIAGNOSIS — R7303 Prediabetes: Secondary | ICD-10-CM | POA: Diagnosis not present

## 2022-05-22 ENCOUNTER — Telehealth: Payer: Self-pay

## 2022-05-22 NOTE — Telephone Encounter (Signed)
Yes we will waive the No show fee for  one time  spoke with patient and they understood

## 2022-05-22 NOTE — Telephone Encounter (Signed)
Patients wife calling in about the no show charge on 12/14. Wife is wondering if we can waive the fee as they both were extremely sick with the flu that day and were unaware they would be charged without calling.

## 2022-05-22 NOTE — Progress Notes (Signed)
Carelink Summary Report / Loop Recorder 

## 2022-05-24 LAB — CUP PACEART REMOTE DEVICE CHECK
Date Time Interrogation Session: 20240203232550
Implantable Pulse Generator Implant Date: 20210610

## 2022-05-25 ENCOUNTER — Ambulatory Visit: Payer: Medicare Other

## 2022-05-25 DIAGNOSIS — I639 Cerebral infarction, unspecified: Secondary | ICD-10-CM

## 2022-05-30 DIAGNOSIS — Z20822 Contact with and (suspected) exposure to covid-19: Secondary | ICD-10-CM | POA: Diagnosis not present

## 2022-05-30 DIAGNOSIS — K219 Gastro-esophageal reflux disease without esophagitis: Secondary | ICD-10-CM | POA: Diagnosis not present

## 2022-05-30 DIAGNOSIS — E119 Type 2 diabetes mellitus without complications: Secondary | ICD-10-CM | POA: Diagnosis not present

## 2022-05-30 DIAGNOSIS — I10 Essential (primary) hypertension: Secondary | ICD-10-CM | POA: Diagnosis not present

## 2022-05-30 DIAGNOSIS — E2740 Unspecified adrenocortical insufficiency: Secondary | ICD-10-CM | POA: Diagnosis not present

## 2022-05-30 DIAGNOSIS — R531 Weakness: Secondary | ICD-10-CM | POA: Diagnosis not present

## 2022-05-30 DIAGNOSIS — I700 Atherosclerosis of aorta: Secondary | ICD-10-CM | POA: Diagnosis not present

## 2022-05-30 DIAGNOSIS — I444 Left anterior fascicular block: Secondary | ICD-10-CM | POA: Diagnosis not present

## 2022-05-30 DIAGNOSIS — G459 Transient cerebral ischemic attack, unspecified: Secondary | ICD-10-CM | POA: Diagnosis not present

## 2022-05-30 DIAGNOSIS — R9431 Abnormal electrocardiogram [ECG] [EKG]: Secondary | ICD-10-CM | POA: Diagnosis not present

## 2022-05-30 DIAGNOSIS — I4891 Unspecified atrial fibrillation: Secondary | ICD-10-CM | POA: Diagnosis not present

## 2022-05-30 DIAGNOSIS — Z8673 Personal history of transient ischemic attack (TIA), and cerebral infarction without residual deficits: Secondary | ICD-10-CM | POA: Diagnosis not present

## 2022-05-30 DIAGNOSIS — Z79899 Other long term (current) drug therapy: Secondary | ICD-10-CM | POA: Diagnosis not present

## 2022-05-30 DIAGNOSIS — Z23 Encounter for immunization: Secondary | ICD-10-CM | POA: Diagnosis not present

## 2022-05-30 DIAGNOSIS — I252 Old myocardial infarction: Secondary | ICD-10-CM | POA: Diagnosis not present

## 2022-05-30 DIAGNOSIS — E785 Hyperlipidemia, unspecified: Secondary | ICD-10-CM | POA: Diagnosis not present

## 2022-05-30 DIAGNOSIS — R2681 Unsteadiness on feet: Secondary | ICD-10-CM | POA: Diagnosis not present

## 2022-05-30 DIAGNOSIS — C61 Malignant neoplasm of prostate: Secondary | ICD-10-CM | POA: Diagnosis not present

## 2022-05-30 DIAGNOSIS — J9811 Atelectasis: Secondary | ICD-10-CM | POA: Diagnosis not present

## 2022-05-30 DIAGNOSIS — I2510 Atherosclerotic heart disease of native coronary artery without angina pectoris: Secondary | ICD-10-CM | POA: Diagnosis not present

## 2022-05-30 DIAGNOSIS — K429 Umbilical hernia without obstruction or gangrene: Secondary | ICD-10-CM | POA: Diagnosis not present

## 2022-05-30 DIAGNOSIS — E669 Obesity, unspecified: Secondary | ICD-10-CM | POA: Diagnosis not present

## 2022-05-30 DIAGNOSIS — Z951 Presence of aortocoronary bypass graft: Secondary | ICD-10-CM | POA: Diagnosis not present

## 2022-05-30 DIAGNOSIS — I25700 Atherosclerosis of coronary artery bypass graft(s), unspecified, with unstable angina pectoris: Secondary | ICD-10-CM | POA: Diagnosis not present

## 2022-05-30 DIAGNOSIS — Z7901 Long term (current) use of anticoagulants: Secondary | ICD-10-CM | POA: Diagnosis not present

## 2022-05-30 DIAGNOSIS — R0609 Other forms of dyspnea: Secondary | ICD-10-CM | POA: Diagnosis not present

## 2022-05-30 DIAGNOSIS — K5730 Diverticulosis of large intestine without perforation or abscess without bleeding: Secondary | ICD-10-CM | POA: Diagnosis not present

## 2022-05-31 DIAGNOSIS — Z7901 Long term (current) use of anticoagulants: Secondary | ICD-10-CM | POA: Diagnosis not present

## 2022-05-31 DIAGNOSIS — E119 Type 2 diabetes mellitus without complications: Secondary | ICD-10-CM | POA: Diagnosis not present

## 2022-05-31 DIAGNOSIS — G459 Transient cerebral ischemic attack, unspecified: Secondary | ICD-10-CM | POA: Diagnosis not present

## 2022-05-31 DIAGNOSIS — R531 Weakness: Secondary | ICD-10-CM | POA: Diagnosis not present

## 2022-05-31 DIAGNOSIS — E785 Hyperlipidemia, unspecified: Secondary | ICD-10-CM | POA: Diagnosis not present

## 2022-05-31 DIAGNOSIS — I25810 Atherosclerosis of coronary artery bypass graft(s) without angina pectoris: Secondary | ICD-10-CM | POA: Diagnosis not present

## 2022-05-31 DIAGNOSIS — R0609 Other forms of dyspnea: Secondary | ICD-10-CM | POA: Diagnosis not present

## 2022-05-31 DIAGNOSIS — I10 Essential (primary) hypertension: Secondary | ICD-10-CM | POA: Diagnosis not present

## 2022-06-01 DIAGNOSIS — G459 Transient cerebral ischemic attack, unspecified: Secondary | ICD-10-CM | POA: Diagnosis not present

## 2022-06-01 DIAGNOSIS — Z7901 Long term (current) use of anticoagulants: Secondary | ICD-10-CM | POA: Diagnosis not present

## 2022-06-01 DIAGNOSIS — I25810 Atherosclerosis of coronary artery bypass graft(s) without angina pectoris: Secondary | ICD-10-CM | POA: Diagnosis not present

## 2022-06-01 DIAGNOSIS — I10 Essential (primary) hypertension: Secondary | ICD-10-CM | POA: Diagnosis not present

## 2022-06-01 DIAGNOSIS — E119 Type 2 diabetes mellitus without complications: Secondary | ICD-10-CM | POA: Diagnosis not present

## 2022-06-01 DIAGNOSIS — R531 Weakness: Secondary | ICD-10-CM | POA: Diagnosis not present

## 2022-06-01 DIAGNOSIS — E785 Hyperlipidemia, unspecified: Secondary | ICD-10-CM | POA: Diagnosis not present

## 2022-06-02 DIAGNOSIS — K219 Gastro-esophageal reflux disease without esophagitis: Secondary | ICD-10-CM | POA: Diagnosis not present

## 2022-06-02 DIAGNOSIS — Z8673 Personal history of transient ischemic attack (TIA), and cerebral infarction without residual deficits: Secondary | ICD-10-CM | POA: Diagnosis not present

## 2022-06-02 DIAGNOSIS — Z79891 Long term (current) use of opiate analgesic: Secondary | ICD-10-CM | POA: Diagnosis not present

## 2022-06-02 DIAGNOSIS — I5023 Acute on chronic systolic (congestive) heart failure: Secondary | ICD-10-CM | POA: Diagnosis not present

## 2022-06-02 DIAGNOSIS — Z7901 Long term (current) use of anticoagulants: Secondary | ICD-10-CM | POA: Diagnosis not present

## 2022-06-02 DIAGNOSIS — Z604 Social exclusion and rejection: Secondary | ICD-10-CM | POA: Diagnosis not present

## 2022-06-02 DIAGNOSIS — I25810 Atherosclerosis of coronary artery bypass graft(s) without angina pectoris: Secondary | ICD-10-CM | POA: Diagnosis not present

## 2022-06-02 DIAGNOSIS — E669 Obesity, unspecified: Secondary | ICD-10-CM | POA: Diagnosis not present

## 2022-06-02 DIAGNOSIS — M8580 Other specified disorders of bone density and structure, unspecified site: Secondary | ICD-10-CM | POA: Diagnosis not present

## 2022-06-02 DIAGNOSIS — Z7984 Long term (current) use of oral hypoglycemic drugs: Secondary | ICD-10-CM | POA: Diagnosis not present

## 2022-06-02 DIAGNOSIS — I4891 Unspecified atrial fibrillation: Secondary | ICD-10-CM | POA: Diagnosis not present

## 2022-06-02 DIAGNOSIS — H9190 Unspecified hearing loss, unspecified ear: Secondary | ICD-10-CM | POA: Diagnosis not present

## 2022-06-02 DIAGNOSIS — E2740 Unspecified adrenocortical insufficiency: Secondary | ICD-10-CM | POA: Diagnosis not present

## 2022-06-02 DIAGNOSIS — I700 Atherosclerosis of aorta: Secondary | ICD-10-CM | POA: Diagnosis not present

## 2022-06-02 DIAGNOSIS — I252 Old myocardial infarction: Secondary | ICD-10-CM | POA: Diagnosis not present

## 2022-06-02 DIAGNOSIS — Z7951 Long term (current) use of inhaled steroids: Secondary | ICD-10-CM | POA: Diagnosis not present

## 2022-06-02 DIAGNOSIS — Z8546 Personal history of malignant neoplasm of prostate: Secondary | ICD-10-CM | POA: Diagnosis not present

## 2022-06-02 DIAGNOSIS — G4733 Obstructive sleep apnea (adult) (pediatric): Secondary | ICD-10-CM | POA: Diagnosis not present

## 2022-06-02 DIAGNOSIS — F32A Depression, unspecified: Secondary | ICD-10-CM | POA: Diagnosis not present

## 2022-06-02 DIAGNOSIS — M1990 Unspecified osteoarthritis, unspecified site: Secondary | ICD-10-CM | POA: Diagnosis not present

## 2022-06-02 DIAGNOSIS — I110 Hypertensive heart disease with heart failure: Secondary | ICD-10-CM | POA: Diagnosis not present

## 2022-06-02 DIAGNOSIS — Z556 Problems related to health literacy: Secondary | ICD-10-CM | POA: Diagnosis not present

## 2022-06-02 DIAGNOSIS — E7800 Pure hypercholesterolemia, unspecified: Secondary | ICD-10-CM | POA: Diagnosis not present

## 2022-06-02 DIAGNOSIS — E119 Type 2 diabetes mellitus without complications: Secondary | ICD-10-CM | POA: Diagnosis not present

## 2022-06-02 DIAGNOSIS — Z8616 Personal history of COVID-19: Secondary | ICD-10-CM | POA: Diagnosis not present

## 2022-06-03 ENCOUNTER — Telehealth: Payer: Self-pay

## 2022-06-03 NOTE — Patient Outreach (Signed)
  Care Coordination   Initial Visit Note   06/03/2022 Name: Ernest Booker MRN: [id number] DOB: 1945-05-16  Ernest Booker is a 77 y.o. year old male who sees Prochnau, Chrys Racer, MD for primary care. I spoke with  Ernest Booker by phone today.  What matters to the patients health and wellness today?  Placed call to patient today to review and offer [facility] care coordination program. Patient reports that he is doing well and denies needs today.    SDOH assessments and interventions completed:  No     Care Coordination Interventions:  No, not indicated   Follow up plan: No further intervention required.   Encounter Outcome:  Pt. Refused   Tomasa Rand, RN, BSN, CEN [facility] [employer] [phone]

## 2022-06-05 DIAGNOSIS — I110 Hypertensive heart disease with heart failure: Secondary | ICD-10-CM | POA: Diagnosis not present

## 2022-06-05 DIAGNOSIS — I4891 Unspecified atrial fibrillation: Secondary | ICD-10-CM | POA: Diagnosis not present

## 2022-06-05 DIAGNOSIS — I5023 Acute on chronic systolic (congestive) heart failure: Secondary | ICD-10-CM | POA: Diagnosis not present

## 2022-06-05 DIAGNOSIS — I25810 Atherosclerosis of coronary artery bypass graft(s) without angina pectoris: Secondary | ICD-10-CM | POA: Diagnosis not present

## 2022-06-05 DIAGNOSIS — E119 Type 2 diabetes mellitus without complications: Secondary | ICD-10-CM | POA: Diagnosis not present

## 2022-06-05 DIAGNOSIS — I700 Atherosclerosis of aorta: Secondary | ICD-10-CM | POA: Diagnosis not present

## 2022-06-08 ENCOUNTER — Encounter: Payer: Self-pay | Admitting: Cardiology

## 2022-06-08 ENCOUNTER — Ambulatory Visit: Payer: Medicare Other | Attending: Cardiology | Admitting: Cardiology

## 2022-06-08 VITALS — BP 148/82 | HR 80 | Ht 66.0 in | Wt 212.8 lb

## 2022-06-08 DIAGNOSIS — I10 Essential (primary) hypertension: Secondary | ICD-10-CM | POA: Insufficient documentation

## 2022-06-08 DIAGNOSIS — E782 Mixed hyperlipidemia: Secondary | ICD-10-CM | POA: Diagnosis not present

## 2022-06-08 DIAGNOSIS — Z951 Presence of aortocoronary bypass graft: Secondary | ICD-10-CM | POA: Insufficient documentation

## 2022-06-08 NOTE — Patient Instructions (Signed)
Medication Instructions:  Your physician recommends that you continue on your current medications as directed. Please refer to the Current Medication list given to you today.  *If you need a refill on your cardiac medications before your next appointment, please call your pharmacy*   Lab Work: BMP- Today If you have labs (blood work) drawn today and your tests are completely normal, you will receive your results only by: [facility] (if you have MyChart) OR A paper copy in the mail If you have any lab test that is abnormal or we need to change your treatment, we will call you to review the results.   Testing/Procedures: None Ordered   Follow-Up: At [facility], you and your health needs are our priority.  As part of our continuing mission to provide you with exceptional heart care, we have created designated Provider Care Teams.  These Care Teams include your primary Cardiologist (physician) and Advanced Practice Providers (APPs -  Physician Assistants and Nurse Practitioners) who all work together to provide you with the care you need, when you need it.  We recommend signing up for the patient portal called "MyChart".  Sign up information is provided on this After Visit Summary.  MyChart is used to connect with patients for Virtual Visits (Telemedicine).  Patients are able to view lab/test results, encounter notes, upcoming appointments, etc.  Non-urgent messages can be sent to your provider as well.   To learn more about what you can do with MyChart, go to [email or web address].    Your next appointment:   4 month(s)  The format for your next appointment:   In Person  Provider:   Jenne Campus, MD    Other Instructions NA

## 2022-06-08 NOTE — Progress Notes (Unsigned)
Cardiology Office Note:    Date:  06/08/2022   ID:  Ernest Booker, DOB 15-Feb-1946, MRN [id number]  PCP:  [name], MD  Cardiologist:  Jenne Campus, MD    Referring MD: [name], MD   No chief complaint on file.   History of Present Illness:    Ernest Booker is a 77 y.o. male  with past medical history significant for coronary artery bypass graft which was done in 2018, with LIMA to LAD SVG to diagonal SVG to ramus and SVG to PDA.  Then cardiac catheterization done 6 months later showed occlusion of all venous grafts except LIMA to LAD being open.  Since that time he been struggling with episode of angina.  He did have cardiac catheterization in 2021 November which basically showed occluded grafts and no target lesions for intervention.  He also got history of essential hypertension, dyslipidemia..  Recently he was diagnosed to have a prostate cancer he initiated radiation therapy as well as hormonal therapy   Recently he ended up going to [facility] because of profound weakness fatigue.  He was find to have stabilized deficiency, he was put on steroid hormones comes today to my office for follow-up and he is doing dramatically better.  He got much more energy he is also much more cheerful.  He said life starts again for him.  Previously he could not do anything.  He also was started on antidepressant.  Past Medical History:  Diagnosis Date   Acute on chronic diastolic CHF (congestive heart failure) (HCC) 02/25/2020   Angina pectoris (HCC)    Anginal pain (HCC)    Arthritis    Atrial fibrillation ([place]) 09/15/2017   Bilateral numbness and tingling of arms and legs 10/29/2015   CAD (coronary artery disease) 09/28/2016   Cardiogenic shock (HCC)    Cervical disc disease 10/29/2015   Chest pain at rest 09/15/2017   Chronic diastolic CHF (congestive heart failure) ([place]) 02/25/2020   Chronic kidney disease (CKD), stage III (moderate) ([place]) 09/15/2017   Coronary  artery disease    Edema    Encounter for therapeutic drug monitoring 10/14/2016   History of TIA (transient ischemic attack) 07/27/2019   Hx of CABG    Hyperlipemia 09/24/2016   Hyperlipidemia    Hyperlipidemia LDL goal <70    Hypertension    Ischemic cardiomyopathy 10/22/2016   Myocardial infarction [facility])    Neck pain 10/29/2015   Non-ST elevation (NSTEMI) myocardial infarction [facility])    NSTEMI (non-ST elevated myocardial infarction) ([place]) 09/24/2016   Numbness in left leg 10/29/2015   Paroxysmal tachycardia (HCC)    Pleural effusion    Presence of aortocoronary bypass graft 10/08/2016   LIMA to LAD -SVG to DIAG SVG to RAMUS SVG to PDA  All grafts occluded except LIMA based on cardiac catheterization 07/2016   Prostate cancer ([place])    Respiratory failure (HCC)    S/P CABG x 4 10/08/2016   LIMA to LAD -SVG to DIAG SVG to RAMUS SVG to PDA  All grafts occluded except LIMA based on cardiac catheterization 07/2016   Shortness of breath    Weakness 10/29/2015   Weakness of both arms 10/29/2015    Past Surgical History:  Procedure Laterality Date   CORONARY ARTERY BYPASS GRAFT N/A 09/28/2016   Procedure: CORONARY ARTERY BYPASS GRAFTING (CABG) x four, using left internal mammary artery and right leg greater saphenous vein harvested endoscopically;  Surgeon: Ivin Poot, MD;  Location: [place];  Service: Open Heart  Surgery;  Laterality: N/A;   IR THORACENTESIS ASP PLEURAL SPACE W/IMG GUIDE  10/06/2016   LEFT HEART CATH AND CORONARY ANGIOGRAPHY N/A 09/24/2016   Procedure: Left Heart Cath and Coronary Angiography;  Surgeon: Lorretta Harp, MD;  Location: [place] CV LAB;  Service: Cardiovascular;  Laterality: N/A;   LEFT HEART CATH AND CORS/GRAFTS ANGIOGRAPHY N/A 02/11/2017   Procedure: LEFT HEART CATH AND CORS/GRAFTS ANGIOGRAPHY;  Surgeon: Jettie Booze, MD;  Location: [place] CV LAB;  Service: Cardiovascular;  Laterality: N/A;   LEFT HEART CATH AND CORS/GRAFTS ANGIOGRAPHY  N/A 02/27/2020   Procedure: LEFT HEART CATH AND CORS/GRAFTS ANGIOGRAPHY;  Surgeon: Martinique, Peter M, MD;  Location: [place] CV LAB;  Service: Cardiovascular;  Laterality: N/A;   TEE WITHOUT CARDIOVERSION N/A 09/28/2016   Procedure: TRANSESOPHAGEAL ECHOCARDIOGRAM (TEE);  Surgeon: Prescott Gum, Collier Salina, MD;  Location: [place];  Service: Open Heart Surgery;  Laterality: N/A;   ULTRASOUND GUIDANCE FOR VASCULAR ACCESS  02/11/2017   Procedure: Ultrasound Guidance For Vascular Access;  Surgeon: Jettie Booze, MD;  Location: [place] CV LAB;  Service: Cardiovascular;;    Current Medications: Current Meds  Medication Sig   amLODipine (NORVASC) 5 MG tablet Take 5 mg by mouth daily.   furosemide (LASIX) 40 MG tablet Take 1 tablet (40 mg total) by mouth daily. TAKE 1 TABLET BY MOUTH EVERY DAY   isosorbide mononitrate (IMDUR) 60 MG 24 hr tablet Take 4 tablets (240 mg total) by mouth daily.   metFORMIN (GLUCOPHAGE-XR) 500 MG 24 hr tablet Take 500 mg by mouth 2 (two) times daily with a meal.   nitroGLYCERIN (NITROSTAT) 0.4 MG SL tablet Place 1 tablet (0.4 mg total) under the tongue every 5 (five) minutes as needed for chest pain.   ondansetron (ZOFRAN-ODT) 4 MG disintegrating tablet Take 4 mg by mouth every 8 (eight) hours as needed for nausea.   pantoprazole (PROTONIX) 40 MG tablet Take 40 mg by mouth in the morning and at bedtime.    ranolazine (RANEXA) 1000 MG SR tablet TAKE 1  BY MOUTH TWICE DAILY   rivaroxaban (XARELTO) 2.5 MG TABS tablet Take 1 tablet by mouth twice daily (Patient taking differently: Take 2.5 mg by mouth 2 (two) times daily.)   rosuvastatin (CRESTOR) 20 MG tablet Take 1 tablet (20 mg total) by mouth daily.   traMADol (ULTRAM) 50 MG tablet Take 50 mg by mouth every 12 (twelve) hours as needed for moderate pain or severe pain.   Vitamin D, Ergocalciferol, (DRISDOL) 1.25 MG (50000 UNIT) CAPS capsule Take 50,000 Units by mouth once a week.     Allergies:   Benadryl [diphenhydramine  hcl (sleep)], Doxycycline, and Levofloxacin   Social History   Socioeconomic History   Marital status: Married    Spouse name: Not on file   Number of children: Not on file   Years of education: Not on file   Highest education level: Not on file  Occupational History   Occupation: Retired  Tobacco Use   Smoking status: Never   Smokeless tobacco: Never  Vaping Use   Vaping Use: Never used  Substance and Sexual Activity   Alcohol use: No   Drug use: No   Sexual activity: Yes    Birth control/protection: None  Other Topics Concern   Not on file  Social History Narrative   Not on file   Social Determinants of Health   Financial Resource Strain: Not on file  Food Insecurity: Not on file  Transportation Needs:  Not on file  Physical Activity: Not on file  Stress: Not on file  Social Connections: Not on file     Family History: The patient's family history includes Heart attack in his father; Heart failure in his mother. ROS:   Please see the history of present illness.    All 14 point review of systems negative except as described per history of present illness  EKGs/Labs/Other Studies Reviewed:      Recent Labs: 11/11/2021: NT-Pro BNP 144 12/29/2021: BUN 24; Creatinine, Ser 1.10; Potassium 3.9; Sodium 139  Recent Lipid Panel    Component Value Date/Time   CHOL 125 10/25/2020 0958   TRIG 111 10/25/2020 0958   HDL 53 10/25/2020 0958   CHOLHDL 2.4 10/25/2020 0958   CHOLHDL 2.8 02/27/2020 0343   VLDL 19 02/27/2020 0343   LDLCALC 52 10/25/2020 0958    Physical Exam:    VS:  BP (!) 148/82 (BP Location: Left Arm, Patient Position: Sitting)   Pulse 80   Ht 5' 6"$  (1.676 m)   Wt 212 lb 12.8 oz (96.5 kg)   SpO2 96%   BMI 34.35 kg/m     Wt Readings from Last 3 Encounters:  06/08/22 212 lb 12.8 oz (96.5 kg)  12/29/21 206 lb (93.4 kg)  11/11/21 208 lb (94.3 kg)     GEN:  Well nourished, well developed in no acute distress HEENT: Normal NECK: No JVD; No  carotid bruits LYMPHATICS: No lymphadenopathy CARDIAC: RRR, no murmurs, no rubs, no gallops RESPIRATORY:  Clear to auscultation without rales, wheezing or rhonchi  ABDOMEN: Soft, non-tender, non-distended MUSCULOSKELETAL:  No edema; No deformity  SKIN: Warm and dry LOWER EXTREMITIES: no swelling NEUROLOGIC:  Alert and oriented x 3 PSYCHIATRIC:  Normal affect   ASSESSMENT:    1. S/P CABG x 4   2. Mixed hyperlipidemia   3. Primary hypertension    PLAN:    In order of problems listed above:  Coronary artery disease send vitals stable.  Doing well from that point review. Mixed dyslipidemia, I did review his K PN which show me his LDL 52 HDL 53.  He is on high intense statin for of Crestor 20 which I will continue. Essential hypertension blood pressure elevated today.  He did get prescription from the hospital for losartan 25 however was afraid to take it.  Will check Chem-7 today if Chem-7 is fine we will start him on a prescription of losartan 25. Steroids deficiency he started taking steroids hormones.  He feels dramatically better today.  Will continue.  I advised him to follow with her his primary care physician.  He may need to see endocrinologist in the future   Medication Adjustments/Labs and Tests Ordered: Current medicines are reviewed at length with the patient today.  Concerns regarding medicines are outlined above.  No orders of the defined types were placed in this encounter.  Medication changes: No orders of the defined types were placed in this encounter.   Signed, Park Liter, MD, [facility] 06/08/2022 8:53 AM    [place]

## 2022-06-09 LAB — BASIC METABOLIC PANEL
BUN/Creatinine Ratio: 23 (ref 10–24)
BUN: 22 mg/dL (ref 8–27)
CO2: 26 mmol/L (ref 20–29)
Calcium: 9.8 mg/dL (ref 8.6–10.2)
Chloride: 101 mmol/L (ref 96–106)
Creatinine, Ser: 0.94 mg/dL (ref 0.76–1.27)
Glucose: 105 mg/dL — ABNORMAL HIGH (ref 70–99)
Potassium: 4.5 mmol/L (ref 3.5–5.2)
Sodium: 141 mmol/L (ref 134–144)
eGFR: 85 mL/min/{1.73_m2} (ref >59)

## 2022-06-10 DIAGNOSIS — I700 Atherosclerosis of aorta: Secondary | ICD-10-CM | POA: Diagnosis not present

## 2022-06-10 DIAGNOSIS — I4891 Unspecified atrial fibrillation: Secondary | ICD-10-CM | POA: Diagnosis not present

## 2022-06-10 DIAGNOSIS — I110 Hypertensive heart disease with heart failure: Secondary | ICD-10-CM | POA: Diagnosis not present

## 2022-06-10 DIAGNOSIS — E119 Type 2 diabetes mellitus without complications: Secondary | ICD-10-CM | POA: Diagnosis not present

## 2022-06-10 DIAGNOSIS — I25810 Atherosclerosis of coronary artery bypass graft(s) without angina pectoris: Secondary | ICD-10-CM | POA: Diagnosis not present

## 2022-06-10 DIAGNOSIS — I5023 Acute on chronic systolic (congestive) heart failure: Secondary | ICD-10-CM | POA: Diagnosis not present

## 2022-06-11 DIAGNOSIS — I5023 Acute on chronic systolic (congestive) heart failure: Secondary | ICD-10-CM | POA: Diagnosis not present

## 2022-06-11 DIAGNOSIS — I110 Hypertensive heart disease with heart failure: Secondary | ICD-10-CM | POA: Diagnosis not present

## 2022-06-11 DIAGNOSIS — I25810 Atherosclerosis of coronary artery bypass graft(s) without angina pectoris: Secondary | ICD-10-CM | POA: Diagnosis not present

## 2022-06-11 DIAGNOSIS — E119 Type 2 diabetes mellitus without complications: Secondary | ICD-10-CM | POA: Diagnosis not present

## 2022-06-11 DIAGNOSIS — I4891 Unspecified atrial fibrillation: Secondary | ICD-10-CM | POA: Diagnosis not present

## 2022-06-11 DIAGNOSIS — I700 Atherosclerosis of aorta: Secondary | ICD-10-CM | POA: Diagnosis not present

## 2022-06-12 ENCOUNTER — Other Ambulatory Visit: Payer: Self-pay | Admitting: Cardiology

## 2022-06-12 DIAGNOSIS — Z8673 Personal history of transient ischemic attack (TIA), and cerebral infarction without residual deficits: Secondary | ICD-10-CM

## 2022-06-12 NOTE — Telephone Encounter (Signed)
Prescription refill request for Xarelto received.  Indication: CAD/TIA  Last office visit: 06/08/22 Agustin Cree)  Weight: 96.5kg Age: 77 Scr: 0.94 (06/08/22)  CrCl: 94.38m/min  Appropriate dose. Refill sent.

## 2022-06-13 ENCOUNTER — Other Ambulatory Visit: Payer: Self-pay | Admitting: Cardiology

## 2022-06-15 NOTE — Telephone Encounter (Signed)
Refill to pharmacy 

## 2022-06-16 DIAGNOSIS — I110 Hypertensive heart disease with heart failure: Secondary | ICD-10-CM | POA: Diagnosis not present

## 2022-06-16 DIAGNOSIS — I25810 Atherosclerosis of coronary artery bypass graft(s) without angina pectoris: Secondary | ICD-10-CM | POA: Diagnosis not present

## 2022-06-16 DIAGNOSIS — E119 Type 2 diabetes mellitus without complications: Secondary | ICD-10-CM | POA: Diagnosis not present

## 2022-06-16 DIAGNOSIS — I700 Atherosclerosis of aorta: Secondary | ICD-10-CM | POA: Diagnosis not present

## 2022-06-16 DIAGNOSIS — I4891 Unspecified atrial fibrillation: Secondary | ICD-10-CM | POA: Diagnosis not present

## 2022-06-16 DIAGNOSIS — I5023 Acute on chronic systolic (congestive) heart failure: Secondary | ICD-10-CM | POA: Diagnosis not present

## 2022-06-17 DIAGNOSIS — I4891 Unspecified atrial fibrillation: Secondary | ICD-10-CM | POA: Diagnosis not present

## 2022-06-17 DIAGNOSIS — I25810 Atherosclerosis of coronary artery bypass graft(s) without angina pectoris: Secondary | ICD-10-CM | POA: Diagnosis not present

## 2022-06-17 DIAGNOSIS — I700 Atherosclerosis of aorta: Secondary | ICD-10-CM | POA: Diagnosis not present

## 2022-06-17 DIAGNOSIS — E119 Type 2 diabetes mellitus without complications: Secondary | ICD-10-CM | POA: Diagnosis not present

## 2022-06-17 DIAGNOSIS — I110 Hypertensive heart disease with heart failure: Secondary | ICD-10-CM | POA: Diagnosis not present

## 2022-06-17 DIAGNOSIS — I5023 Acute on chronic systolic (congestive) heart failure: Secondary | ICD-10-CM | POA: Diagnosis not present

## 2022-06-22 DIAGNOSIS — C61 Malignant neoplasm of prostate: Secondary | ICD-10-CM | POA: Diagnosis not present

## 2022-06-22 DIAGNOSIS — N3289 Other specified disorders of bladder: Secondary | ICD-10-CM | POA: Diagnosis not present

## 2022-06-26 DIAGNOSIS — M19011 Primary osteoarthritis, right shoulder: Secondary | ICD-10-CM | POA: Diagnosis not present

## 2022-06-26 LAB — CUP PACEART REMOTE DEVICE CHECK
Date Time Interrogation Session: 20240307232419
Implantable Pulse Generator Implant Date: 20210610

## 2022-06-29 ENCOUNTER — Ambulatory Visit (INDEPENDENT_AMBULATORY_CARE_PROVIDER_SITE_OTHER): Payer: Medicare Other

## 2022-06-29 DIAGNOSIS — I639 Cerebral infarction, unspecified: Secondary | ICD-10-CM

## 2022-07-09 NOTE — Progress Notes (Signed)
Carelink Summary Report / Loop Recorder 

## 2022-07-30 LAB — CUP PACEART REMOTE DEVICE CHECK
Date Time Interrogation Session: 20240410002428
Implantable Pulse Generator Implant Date: 20210610

## 2022-08-01 ENCOUNTER — Other Ambulatory Visit: Payer: Self-pay | Admitting: Cardiology

## 2022-08-03 ENCOUNTER — Telehealth: Payer: Self-pay

## 2022-08-03 ENCOUNTER — Ambulatory Visit (INDEPENDENT_AMBULATORY_CARE_PROVIDER_SITE_OTHER): Payer: Medicare Other

## 2022-08-03 DIAGNOSIS — I639 Cerebral infarction, unspecified: Secondary | ICD-10-CM | POA: Diagnosis not present

## 2022-08-03 NOTE — Telephone Encounter (Signed)
Following alert received from [employer] received for 1 tachy alert, no EMG's available. Called pt to send manual transmission.   Manual transmission reviewed. 1 tachy event on 06/30/22 with duration of 5 seconds. Patient unable to recall any symptoms. Will continue to monitor and patient agreeable to call if any s/s arise.

## 2022-08-03 NOTE — Telephone Encounter (Signed)
Rx refill sent to pharmacy. 

## 2022-08-10 NOTE — Progress Notes (Signed)
Carelink Summary Report / Loop Recorder 

## 2022-08-23 ENCOUNTER — Other Ambulatory Visit: Payer: Self-pay | Admitting: Cardiology

## 2022-08-24 DIAGNOSIS — M25552 Pain in left hip: Secondary | ICD-10-CM | POA: Diagnosis not present

## 2022-08-24 DIAGNOSIS — R5383 Other fatigue: Secondary | ICD-10-CM | POA: Diagnosis not present

## 2022-08-24 DIAGNOSIS — Z6833 Body mass index (BMI) 33.0-33.9, adult: Secondary | ICD-10-CM | POA: Diagnosis not present

## 2022-08-24 DIAGNOSIS — M25551 Pain in right hip: Secondary | ICD-10-CM | POA: Diagnosis not present

## 2022-08-26 DIAGNOSIS — M25552 Pain in left hip: Secondary | ICD-10-CM | POA: Diagnosis not present

## 2022-08-26 DIAGNOSIS — M25551 Pain in right hip: Secondary | ICD-10-CM | POA: Diagnosis not present

## 2022-09-01 ENCOUNTER — Other Ambulatory Visit: Payer: Self-pay | Admitting: Cardiology

## 2022-09-01 DIAGNOSIS — I2510 Atherosclerotic heart disease of native coronary artery without angina pectoris: Secondary | ICD-10-CM | POA: Diagnosis not present

## 2022-09-01 DIAGNOSIS — R7989 Other specified abnormal findings of blood chemistry: Secondary | ICD-10-CM | POA: Diagnosis not present

## 2022-09-01 DIAGNOSIS — Z79899 Other long term (current) drug therapy: Secondary | ICD-10-CM | POA: Diagnosis not present

## 2022-09-01 DIAGNOSIS — I10 Essential (primary) hypertension: Secondary | ICD-10-CM | POA: Diagnosis not present

## 2022-09-01 DIAGNOSIS — R9431 Abnormal electrocardiogram [ECG] [EKG]: Secondary | ICD-10-CM | POA: Diagnosis not present

## 2022-09-01 DIAGNOSIS — R9389 Abnormal findings on diagnostic imaging of other specified body structures: Secondary | ICD-10-CM | POA: Diagnosis not present

## 2022-09-01 DIAGNOSIS — R079 Chest pain, unspecified: Secondary | ICD-10-CM | POA: Diagnosis not present

## 2022-09-01 DIAGNOSIS — R0789 Other chest pain: Secondary | ICD-10-CM | POA: Diagnosis not present

## 2022-09-01 DIAGNOSIS — S20219A Contusion of unspecified front wall of thorax, initial encounter: Secondary | ICD-10-CM | POA: Diagnosis not present

## 2022-09-01 DIAGNOSIS — Z041 Encounter for examination and observation following transport accident: Secondary | ICD-10-CM | POA: Diagnosis not present

## 2022-09-01 DIAGNOSIS — I444 Left anterior fascicular block: Secondary | ICD-10-CM | POA: Diagnosis not present

## 2022-09-01 DIAGNOSIS — K429 Umbilical hernia without obstruction or gangrene: Secondary | ICD-10-CM | POA: Diagnosis not present

## 2022-09-01 DIAGNOSIS — S298XXA Other specified injuries of thorax, initial encounter: Secondary | ICD-10-CM | POA: Diagnosis not present

## 2022-09-01 DIAGNOSIS — Z951 Presence of aortocoronary bypass graft: Secondary | ICD-10-CM | POA: Diagnosis not present

## 2022-09-01 DIAGNOSIS — Z5181 Encounter for therapeutic drug level monitoring: Secondary | ICD-10-CM | POA: Diagnosis not present

## 2022-09-02 DIAGNOSIS — S299XXA Unspecified injury of thorax, initial encounter: Secondary | ICD-10-CM | POA: Diagnosis not present

## 2022-09-02 DIAGNOSIS — R0789 Other chest pain: Secondary | ICD-10-CM | POA: Diagnosis not present

## 2022-09-02 DIAGNOSIS — Z041 Encounter for examination and observation following transport accident: Secondary | ICD-10-CM | POA: Diagnosis not present

## 2022-09-02 DIAGNOSIS — R079 Chest pain, unspecified: Secondary | ICD-10-CM | POA: Diagnosis not present

## 2022-09-02 DIAGNOSIS — R10813 Right lower quadrant abdominal tenderness: Secondary | ICD-10-CM | POA: Diagnosis not present

## 2022-09-02 DIAGNOSIS — R1032 Left lower quadrant pain: Secondary | ICD-10-CM | POA: Diagnosis not present

## 2022-09-02 DIAGNOSIS — R9431 Abnormal electrocardiogram [ECG] [EKG]: Secondary | ICD-10-CM | POA: Diagnosis not present

## 2022-09-02 DIAGNOSIS — Y92488 Other paved roadways as the place of occurrence of the external cause: Secondary | ICD-10-CM | POA: Diagnosis not present

## 2022-09-02 DIAGNOSIS — S20219A Contusion of unspecified front wall of thorax, initial encounter: Secondary | ICD-10-CM | POA: Diagnosis not present

## 2022-09-02 DIAGNOSIS — R1031 Right lower quadrant pain: Secondary | ICD-10-CM | POA: Diagnosis not present

## 2022-09-02 DIAGNOSIS — M47814 Spondylosis without myelopathy or radiculopathy, thoracic region: Secondary | ICD-10-CM | POA: Diagnosis not present

## 2022-09-02 DIAGNOSIS — I10 Essential (primary) hypertension: Secondary | ICD-10-CM | POA: Diagnosis not present

## 2022-09-02 DIAGNOSIS — M8580 Other specified disorders of bone density and structure, unspecified site: Secondary | ICD-10-CM | POA: Diagnosis not present

## 2022-09-02 DIAGNOSIS — S301XXA Contusion of abdominal wall, initial encounter: Secondary | ICD-10-CM | POA: Diagnosis not present

## 2022-09-02 DIAGNOSIS — I700 Atherosclerosis of aorta: Secondary | ICD-10-CM | POA: Diagnosis not present

## 2022-09-02 DIAGNOSIS — Z5181 Encounter for therapeutic drug level monitoring: Secondary | ICD-10-CM | POA: Diagnosis not present

## 2022-09-02 DIAGNOSIS — I7025 Atherosclerosis of native arteries of other extremities with ulceration: Secondary | ICD-10-CM | POA: Diagnosis not present

## 2022-09-02 DIAGNOSIS — Z8679 Personal history of other diseases of the circulatory system: Secondary | ICD-10-CM | POA: Diagnosis not present

## 2022-09-02 DIAGNOSIS — Z79899 Other long term (current) drug therapy: Secondary | ICD-10-CM | POA: Diagnosis not present

## 2022-09-03 LAB — CUP PACEART REMOTE DEVICE CHECK
Date Time Interrogation Session: 20240513002737
Implantable Pulse Generator Implant Date: 20210610

## 2022-09-07 ENCOUNTER — Ambulatory Visit (INDEPENDENT_AMBULATORY_CARE_PROVIDER_SITE_OTHER): Payer: Medicare Other

## 2022-09-07 DIAGNOSIS — I639 Cerebral infarction, unspecified: Secondary | ICD-10-CM

## 2022-09-07 DIAGNOSIS — N289 Disorder of kidney and ureter, unspecified: Secondary | ICD-10-CM | POA: Diagnosis not present

## 2022-09-07 DIAGNOSIS — R5383 Other fatigue: Secondary | ICD-10-CM | POA: Diagnosis not present

## 2022-09-07 DIAGNOSIS — E2740 Unspecified adrenocortical insufficiency: Secondary | ICD-10-CM | POA: Diagnosis not present

## 2022-09-07 DIAGNOSIS — Z79899 Other long term (current) drug therapy: Secondary | ICD-10-CM | POA: Diagnosis not present

## 2022-09-07 DIAGNOSIS — M87052 Idiopathic aseptic necrosis of left femur: Secondary | ICD-10-CM | POA: Diagnosis not present

## 2022-09-07 DIAGNOSIS — C61 Malignant neoplasm of prostate: Secondary | ICD-10-CM | POA: Diagnosis not present

## 2022-09-07 DIAGNOSIS — Z6833 Body mass index (BMI) 33.0-33.9, adult: Secondary | ICD-10-CM | POA: Diagnosis not present

## 2022-09-07 DIAGNOSIS — E559 Vitamin D deficiency, unspecified: Secondary | ICD-10-CM | POA: Diagnosis not present

## 2022-09-07 DIAGNOSIS — N189 Chronic kidney disease, unspecified: Secondary | ICD-10-CM | POA: Diagnosis not present

## 2022-09-07 DIAGNOSIS — E88819 Insulin resistance, unspecified: Secondary | ICD-10-CM | POA: Diagnosis not present

## 2022-09-07 DIAGNOSIS — R7303 Prediabetes: Secondary | ICD-10-CM | POA: Diagnosis not present

## 2022-09-09 NOTE — Progress Notes (Signed)
Carelink Summary Report / Loop Recorder 

## 2022-09-21 DIAGNOSIS — E782 Mixed hyperlipidemia: Secondary | ICD-10-CM | POA: Diagnosis not present

## 2022-09-21 DIAGNOSIS — I480 Paroxysmal atrial fibrillation: Secondary | ICD-10-CM | POA: Diagnosis not present

## 2022-09-21 DIAGNOSIS — I255 Ischemic cardiomyopathy: Secondary | ICD-10-CM | POA: Diagnosis not present

## 2022-09-21 DIAGNOSIS — I719 Aortic aneurysm of unspecified site, without rupture: Secondary | ICD-10-CM | POA: Diagnosis not present

## 2022-09-23 DIAGNOSIS — C61 Malignant neoplasm of prostate: Secondary | ICD-10-CM | POA: Diagnosis not present

## 2022-09-23 DIAGNOSIS — N3289 Other specified disorders of bladder: Secondary | ICD-10-CM | POA: Diagnosis not present

## 2022-10-01 DIAGNOSIS — G8929 Other chronic pain: Secondary | ICD-10-CM | POA: Diagnosis not present

## 2022-10-01 DIAGNOSIS — M25562 Pain in left knee: Secondary | ICD-10-CM | POA: Diagnosis not present

## 2022-10-05 DIAGNOSIS — Z79899 Other long term (current) drug therapy: Secondary | ICD-10-CM | POA: Diagnosis not present

## 2022-10-06 NOTE — Progress Notes (Signed)
Carelink Summary Report / Loop Recorder 

## 2022-10-08 ENCOUNTER — Encounter: Payer: Self-pay | Admitting: Cardiology

## 2022-10-08 ENCOUNTER — Ambulatory Visit: Payer: Medicare Other | Attending: Cardiology | Admitting: Cardiology

## 2022-10-08 VITALS — BP 122/86 | HR 78 | Ht 66.0 in | Wt 217.0 lb

## 2022-10-08 DIAGNOSIS — I5032 Chronic diastolic (congestive) heart failure: Secondary | ICD-10-CM | POA: Insufficient documentation

## 2022-10-08 DIAGNOSIS — I255 Ischemic cardiomyopathy: Secondary | ICD-10-CM | POA: Insufficient documentation

## 2022-10-08 DIAGNOSIS — I719 Aortic aneurysm of unspecified site, without rupture: Secondary | ICD-10-CM

## 2022-10-08 DIAGNOSIS — I25118 Atherosclerotic heart disease of native coronary artery with other forms of angina pectoris: Secondary | ICD-10-CM | POA: Diagnosis not present

## 2022-10-08 DIAGNOSIS — E782 Mixed hyperlipidemia: Secondary | ICD-10-CM | POA: Insufficient documentation

## 2022-10-08 DIAGNOSIS — Z951 Presence of aortocoronary bypass graft: Secondary | ICD-10-CM | POA: Insufficient documentation

## 2022-10-08 HISTORY — DX: Aortic aneurysm of unspecified site, without rupture: I71.9
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
## 2022-10-08 NOTE — Addendum Note (Signed)
Addended by: Roxanne Mins I on: 10/08/2022 09:02 AM   Modules accepted: Orders

## 2022-10-08 NOTE — Patient Instructions (Signed)
Medication Instructions:  Your physician recommends that you continue on your current medications as directed. Please refer to the Current Medication list given to you today.  *If you need a refill on your cardiac medications before your next appointment, please call your pharmacy*   Lab Work: Your physician recommends that you return for lab work in:   Labs today: Pro BNP, TSH, CMP  If you have labs (blood work) drawn today and your tests are completely normal, you will receive your results only by: MyChart Message (if you have MyChart) OR A paper copy in the mail If you have any lab test that is abnormal or we need to change your treatment, we will call you to review the results.   Testing/Procedures: None   Follow-Up: At [facility], you and your health needs are our priority.  As part of our continuing mission to provide you with exceptional heart care, we have created designated Provider Care Teams.  These Care Teams include your primary Cardiologist (physician) and Advanced Practice Providers (APPs -  Physician Assistants and Nurse Practitioners) who all work together to provide you with the care you need, when you need it.  We recommend signing up for the patient portal called "MyChart".  Sign up information is provided on this After Visit Summary.  MyChart is used to connect with patients for Virtual Visits (Telemedicine).  Patients are able to view lab/test results, encounter notes, upcoming appointments, etc.  Non-urgent messages can be sent to your provider as well.   To learn more about what you can do with MyChart, go to [email or web address].    Your next appointment:   1 month(s)  Provider:   Gypsy Balsam, MD    Other Instructions None

## 2022-10-08 NOTE — Addendum Note (Signed)
Addended by: Belva Bertin on: 10/08/2022 09:24 AM   Modules accepted: Orders

## 2022-10-09 LAB — COMPREHENSIVE METABOLIC PANEL
ALT: 15 IU/L (ref 0–44)
AST: 10 IU/L (ref 0–40)
Albumin: 3.9 g/dL (ref 3.8–4.8)
Alkaline Phosphatase: 60 IU/L (ref 44–121)
BUN/Creatinine Ratio: 12 (ref 10–24)
BUN: 14 mg/dL (ref 8–27)
Bilirubin Total: 0.7 mg/dL (ref 0.0–1.2)
CO2: 27 mmol/L (ref 20–29)
Calcium: 9.6 mg/dL (ref 8.6–10.2)
Chloride: 100 mmol/L (ref 96–106)
Creatinine, Ser: 1.2 mg/dL (ref 0.76–1.27)
Globulin, Total: 2 g/dL (ref 1.5–4.5)
Glucose: 126 mg/dL — ABNORMAL HIGH (ref 70–99)
Potassium: 3.6 mmol/L (ref 3.5–5.2)
Sodium: 140 mmol/L (ref 134–144)
Total Protein: 5.9 g/dL — ABNORMAL LOW (ref 6.0–8.5)
eGFR: 63 mL/min/{1.73_m2} (ref >59)

## 2022-10-09 LAB — TSH: TSH: 2.36 u[IU]/mL (ref 0.450–4.500)

## 2022-10-09 LAB — PRO B NATRIURETIC PEPTIDE: NT-Pro BNP: 194 pg/mL (ref 0–376)

## 2022-10-12 ENCOUNTER — Ambulatory Visit: Payer: Medicare Other

## 2022-10-12 DIAGNOSIS — I639 Cerebral infarction, unspecified: Secondary | ICD-10-CM | POA: Diagnosis not present

## 2022-10-12 LAB — CUP PACEART REMOTE DEVICE CHECK
Date Time Interrogation Session: 20240624112052
Implantable Pulse Generator Implant Date: 20210610
Zone Setting Status: 755011
Zone Setting Status: 755011
Zone Setting Status: 755011
Zone Setting Status: 755011

## 2022-10-15 ENCOUNTER — Telehealth: Payer: Self-pay

## 2022-10-15 DIAGNOSIS — I5032 Chronic diastolic (congestive) heart failure: Secondary | ICD-10-CM

## 2022-10-15 MED ORDER — FUROSEMIDE 40 MG PO TABS: 60.0000 mg | ORAL_TABLET | Freq: Every day | ORAL | 3 refills | Status: DC

## 2022-10-15 MED ORDER — POTASSIUM CHLORIDE ER 10 MEQ PO TBCR: 10.0000 meq | EXTENDED_RELEASE_TABLET | Freq: Every day | ORAL | 3 refills | Status: DC

## 2022-10-15 NOTE — Telephone Encounter (Signed)
RX sent

## 2022-10-15 NOTE — Telephone Encounter (Signed)
-----   Message from Georgeanna Lea, MD sent at 10/11/2022 10:27 AM EDT ----- Labs are looking good but for swelling increase Lasix to 60 mg daily, please add 10 mg of potassium, Chem-7 need to be done within a week

## 2022-10-17 ENCOUNTER — Other Ambulatory Visit: Payer: Self-pay | Admitting: Cardiology

## 2022-10-23 DIAGNOSIS — I5032 Chronic diastolic (congestive) heart failure: Secondary | ICD-10-CM | POA: Diagnosis not present

## 2022-10-24 LAB — BASIC METABOLIC PANEL
BUN/Creatinine Ratio: 20 (ref 10–24)
BUN: 22 mg/dL (ref 8–27)
CO2: 25 mmol/L (ref 20–29)
Calcium: 9.4 mg/dL (ref 8.6–10.2)
Chloride: 101 mmol/L (ref 96–106)
Creatinine, Ser: 1.1 mg/dL (ref 0.76–1.27)
Glucose: 152 mg/dL — ABNORMAL HIGH (ref 70–99)
Potassium: 4.1 mmol/L (ref 3.5–5.2)
Sodium: 140 mmol/L (ref 134–144)
eGFR: 70 mL/min/{1.73_m2} (ref >59)

## 2022-10-26 DIAGNOSIS — M1712 Unilateral primary osteoarthritis, left knee: Secondary | ICD-10-CM | POA: Diagnosis not present

## 2022-10-29 DIAGNOSIS — R5383 Other fatigue: Secondary | ICD-10-CM | POA: Diagnosis not present

## 2022-10-29 DIAGNOSIS — R519 Headache, unspecified: Secondary | ICD-10-CM | POA: Diagnosis not present

## 2022-10-29 DIAGNOSIS — Z1152 Encounter for screening for COVID-19: Secondary | ICD-10-CM | POA: Diagnosis not present

## 2022-10-29 DIAGNOSIS — A084 Viral intestinal infection, unspecified: Secondary | ICD-10-CM | POA: Diagnosis not present

## 2022-10-29 DIAGNOSIS — R197 Diarrhea, unspecified: Secondary | ICD-10-CM | POA: Diagnosis not present

## 2022-10-29 DIAGNOSIS — R5381 Other malaise: Secondary | ICD-10-CM | POA: Diagnosis not present

## 2022-10-30 ENCOUNTER — Telehealth: Payer: Self-pay

## 2022-10-30 NOTE — Telephone Encounter (Signed)
Left message on My Chart with normal results per Dr. Krasowski's note. Routed to PCP. 

## 2022-10-31 ENCOUNTER — Other Ambulatory Visit: Payer: Self-pay | Admitting: Cardiology

## 2022-11-02 ENCOUNTER — Telehealth: Payer: Self-pay

## 2022-11-02 DIAGNOSIS — J0100 Acute maxillary sinusitis, unspecified: Secondary | ICD-10-CM | POA: Diagnosis not present

## 2022-11-02 DIAGNOSIS — R519 Headache, unspecified: Secondary | ICD-10-CM | POA: Diagnosis not present

## 2022-11-02 DIAGNOSIS — R5383 Other fatigue: Secondary | ICD-10-CM | POA: Diagnosis not present

## 2022-11-02 DIAGNOSIS — Z6834 Body mass index (BMI) 34.0-34.9, adult: Secondary | ICD-10-CM | POA: Diagnosis not present

## 2022-11-02 DIAGNOSIS — H109 Unspecified conjunctivitis: Secondary | ICD-10-CM | POA: Diagnosis not present

## 2022-11-02 DIAGNOSIS — M1712 Unilateral primary osteoarthritis, left knee: Secondary | ICD-10-CM | POA: Diagnosis not present

## 2022-11-02 NOTE — Telephone Encounter (Signed)
Pt viewed results in My Chart per Dr. Krasowski's note. Routed to PCP.  

## 2022-11-02 NOTE — Progress Notes (Signed)
 [facility] Summary Report / Loop Recorder 

## 2022-11-08 ENCOUNTER — Encounter: Payer: Self-pay | Admitting: Cardiology
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
## 2022-11-09 ENCOUNTER — Ambulatory Visit: Payer: Medicare Other | Attending: Cardiology | Admitting: Cardiology

## 2022-11-09 ENCOUNTER — Encounter: Payer: Self-pay | Admitting: Cardiology

## 2022-11-09 VITALS — BP 132/80 | HR 83 | Ht 66.0 in | Wt 216.2 lb

## 2022-11-09 DIAGNOSIS — Z951 Presence of aortocoronary bypass graft: Secondary | ICD-10-CM

## 2022-11-09 DIAGNOSIS — I10 Essential (primary) hypertension: Secondary | ICD-10-CM | POA: Diagnosis present

## 2022-11-09 DIAGNOSIS — I5032 Chronic diastolic (congestive) heart failure: Secondary | ICD-10-CM

## 2022-11-09 DIAGNOSIS — I255 Ischemic cardiomyopathy: Secondary | ICD-10-CM | POA: Diagnosis present

## 2022-11-09 DIAGNOSIS — I719 Aortic aneurysm of unspecified site, without rupture: Secondary | ICD-10-CM | POA: Diagnosis present

## 2022-11-09 DIAGNOSIS — E782 Mixed hyperlipidemia: Secondary | ICD-10-CM | POA: Diagnosis present

## 2022-11-09 DIAGNOSIS — I639 Cerebral infarction, unspecified: Secondary | ICD-10-CM

## 2022-11-09 DIAGNOSIS — M1712 Unilateral primary osteoarthritis, left knee: Secondary | ICD-10-CM | POA: Diagnosis not present

## 2022-11-09 DIAGNOSIS — I25118 Atherosclerotic heart disease of native coronary artery with other forms of angina pectoris: Secondary | ICD-10-CM

## 2022-11-09 NOTE — Patient Instructions (Addendum)
Medication Instructions:  Your physician has recommended you make the following change in your medication:  Take an extra Lasix today at 2:00 PM  *If you need a refill on your cardiac medications before your next appointment, please call your pharmacy*   Lab Work: NONE If you have labs (blood work) drawn today and your tests are completely normal, you will receive your results only by: MyChart Message (if you have MyChart) OR A paper copy in the mail If you have any lab test that is abnormal or we need to change your treatment, we will call you to review the results.   Testing/Procedures: NONE   Follow-Up: At [facility], you and your health needs are our priority.  As part of our continuing mission to provide you with exceptional heart care, we have created designated Provider Care Teams.  These Care Teams include your primary Cardiologist (physician) and Advanced Practice Providers (APPs -  Physician Assistants and Nurse Practitioners) who all work together to provide you with the care you need, when you need it.  We recommend signing up for the patient portal called "MyChart".  Sign up information is provided on this After Visit Summary.  MyChart is used to connect with patients for Virtual Visits (Telemedicine).  Patients are able to view lab/test results, encounter notes, upcoming appointments, etc.  Non-urgent messages can be sent to your provider as well.   To learn more about what you can do with MyChart, go to [email or web address].    Your next appointment:   2 month(s)  Provider:   Gypsy Balsam, MD    Other Instructions If feeling better before next visit call and reschedule for 6 months out from today. Around Dec. 23rd

## 2022-11-11 DIAGNOSIS — Z6834 Body mass index (BMI) 34.0-34.9, adult: Secondary | ICD-10-CM | POA: Diagnosis not present

## 2022-11-11 DIAGNOSIS — Z79899 Other long term (current) drug therapy: Secondary | ICD-10-CM | POA: Diagnosis not present

## 2022-11-11 DIAGNOSIS — W57XXXA Bitten or stung by nonvenomous insect and other nonvenomous arthropods, initial encounter: Secondary | ICD-10-CM | POA: Diagnosis not present

## 2022-11-11 DIAGNOSIS — E559 Vitamin D deficiency, unspecified: Secondary | ICD-10-CM | POA: Diagnosis not present

## 2022-11-11 DIAGNOSIS — E039 Hypothyroidism, unspecified: Secondary | ICD-10-CM | POA: Diagnosis not present

## 2022-11-11 DIAGNOSIS — R110 Nausea: Secondary | ICD-10-CM | POA: Diagnosis not present

## 2022-11-11 DIAGNOSIS — R5383 Other fatigue: Secondary | ICD-10-CM | POA: Diagnosis not present

## 2022-11-11 DIAGNOSIS — E538 Deficiency of other specified B group vitamins: Secondary | ICD-10-CM | POA: Diagnosis not present

## 2022-11-13 DIAGNOSIS — R110 Nausea: Secondary | ICD-10-CM | POA: Diagnosis not present

## 2022-11-14 LAB — CMP 10231: EGFR: 53

## 2022-11-16 ENCOUNTER — Ambulatory Visit (INDEPENDENT_AMBULATORY_CARE_PROVIDER_SITE_OTHER): Payer: Medicare Other

## 2022-11-16 DIAGNOSIS — I639 Cerebral infarction, unspecified: Secondary | ICD-10-CM

## 2022-11-24 DIAGNOSIS — M87052 Idiopathic aseptic necrosis of left femur: Secondary | ICD-10-CM | POA: Diagnosis not present

## 2022-11-24 DIAGNOSIS — M1612 Unilateral primary osteoarthritis, left hip: Secondary | ICD-10-CM | POA: Diagnosis not present

## 2022-12-01 DIAGNOSIS — R600 Localized edema: Secondary | ICD-10-CM | POA: Diagnosis not present

## 2022-12-01 DIAGNOSIS — M25452 Effusion, left hip: Secondary | ICD-10-CM | POA: Diagnosis not present

## 2022-12-01 DIAGNOSIS — M87052 Idiopathic aseptic necrosis of left femur: Secondary | ICD-10-CM | POA: Diagnosis not present

## 2022-12-01 DIAGNOSIS — M160 Bilateral primary osteoarthritis of hip: Secondary | ICD-10-CM | POA: Diagnosis not present

## 2022-12-02 DIAGNOSIS — R21 Rash and other nonspecific skin eruption: Secondary | ICD-10-CM | POA: Diagnosis not present

## 2022-12-02 DIAGNOSIS — M25552 Pain in left hip: Secondary | ICD-10-CM | POA: Diagnosis not present

## 2022-12-02 DIAGNOSIS — R110 Nausea: Secondary | ICD-10-CM | POA: Diagnosis not present

## 2022-12-02 DIAGNOSIS — M00859 Arthritis due to other bacteria, unspecified hip: Secondary | ICD-10-CM | POA: Diagnosis not present

## 2022-12-02 DIAGNOSIS — M25452 Effusion, left hip: Secondary | ICD-10-CM | POA: Diagnosis not present

## 2022-12-03 NOTE — Progress Notes (Signed)
Carelink Summary Report / Loop Recorder 

## 2022-12-04 DIAGNOSIS — M1612 Unilateral primary osteoarthritis, left hip: Secondary | ICD-10-CM | POA: Diagnosis not present

## 2022-12-04 DIAGNOSIS — M87052 Idiopathic aseptic necrosis of left femur: Secondary | ICD-10-CM | POA: Diagnosis not present

## 2022-12-08 ENCOUNTER — Other Ambulatory Visit: Payer: Self-pay | Admitting: Cardiology

## 2022-12-08 DIAGNOSIS — Z8673 Personal history of transient ischemic attack (TIA), and cerebral infarction without residual deficits: Secondary | ICD-10-CM

## 2022-12-09 DIAGNOSIS — E1169 Type 2 diabetes mellitus with other specified complication: Secondary | ICD-10-CM | POA: Diagnosis not present

## 2022-12-09 DIAGNOSIS — Z79899 Other long term (current) drug therapy: Secondary | ICD-10-CM | POA: Diagnosis not present

## 2022-12-09 DIAGNOSIS — D649 Anemia, unspecified: Secondary | ICD-10-CM | POA: Diagnosis not present

## 2022-12-09 DIAGNOSIS — E538 Deficiency of other specified B group vitamins: Secondary | ICD-10-CM | POA: Diagnosis not present

## 2022-12-14 ENCOUNTER — Other Ambulatory Visit: Payer: Self-pay | Admitting: Cardiology

## 2022-12-14 DIAGNOSIS — Z8673 Personal history of transient ischemic attack (TIA), and cerebral infarction without residual deficits: Secondary | ICD-10-CM

## 2022-12-14 NOTE — Telephone Encounter (Signed)
Prescription refill request for Xarelto received.  Indication: CAD Last office visit: 11/09/22  Anson [place] NP Weight: 98.1kg Age: 77 Scr: 1.20 on 10/08/22 CrCl: 74.94  Based on above findings Xarelto 2.5mg  twice daily is the appropriate dose.  Refill approved.

## 2022-12-22 ENCOUNTER — Ambulatory Visit: Payer: Medicare Other

## 2022-12-23 DIAGNOSIS — N3289 Other specified disorders of bladder: Secondary | ICD-10-CM | POA: Diagnosis not present

## 2022-12-23 DIAGNOSIS — C61 Malignant neoplasm of prostate: Secondary | ICD-10-CM | POA: Diagnosis not present

## 2022-12-28 DIAGNOSIS — M25511 Pain in right shoulder: Secondary | ICD-10-CM | POA: Diagnosis not present

## 2022-12-28 DIAGNOSIS — G8929 Other chronic pain: Secondary | ICD-10-CM | POA: Diagnosis not present

## 2022-12-30 DIAGNOSIS — F331 Major depressive disorder, recurrent, moderate: Secondary | ICD-10-CM | POA: Diagnosis not present

## 2022-12-30 DIAGNOSIS — Z6833 Body mass index (BMI) 33.0-33.9, adult: Secondary | ICD-10-CM | POA: Diagnosis not present

## 2022-12-30 DIAGNOSIS — Z0000 Encounter for general adult medical examination without abnormal findings: Secondary | ICD-10-CM | POA: Diagnosis not present

## 2022-12-30 DIAGNOSIS — I25709 Atherosclerosis of coronary artery bypass graft(s), unspecified, with unspecified angina pectoris: Secondary | ICD-10-CM | POA: Diagnosis not present

## 2022-12-30 DIAGNOSIS — M87052 Idiopathic aseptic necrosis of left femur: Secondary | ICD-10-CM | POA: Diagnosis not present

## 2022-12-30 DIAGNOSIS — I10 Essential (primary) hypertension: Secondary | ICD-10-CM | POA: Diagnosis not present

## 2022-12-30 DIAGNOSIS — Z1331 Encounter for screening for depression: Secondary | ICD-10-CM | POA: Diagnosis not present

## 2022-12-30 DIAGNOSIS — G4733 Obstructive sleep apnea (adult) (pediatric): Secondary | ICD-10-CM | POA: Diagnosis not present

## 2022-12-30 DIAGNOSIS — K219 Gastro-esophageal reflux disease without esophagitis: Secondary | ICD-10-CM | POA: Diagnosis not present

## 2023-01-06 DIAGNOSIS — E538 Deficiency of other specified B group vitamins: Secondary | ICD-10-CM | POA: Diagnosis not present

## 2023-01-07 DIAGNOSIS — M87052 Idiopathic aseptic necrosis of left femur: Secondary | ICD-10-CM | POA: Diagnosis not present

## 2023-01-07 DIAGNOSIS — I25709 Atherosclerosis of coronary artery bypass graft(s), unspecified, with unspecified angina pectoris: Secondary | ICD-10-CM | POA: Diagnosis not present

## 2023-01-07 DIAGNOSIS — M150 Primary generalized (osteo)arthritis: Secondary | ICD-10-CM | POA: Diagnosis not present

## 2023-01-14 ENCOUNTER — Ambulatory Visit: Payer: Medicare Other | Admitting: Cardiology

## 2023-01-14 DIAGNOSIS — L578 Other skin changes due to chronic exposure to nonionizing radiation: Secondary | ICD-10-CM | POA: Diagnosis not present

## 2023-01-14 DIAGNOSIS — L814 Other melanin hyperpigmentation: Secondary | ICD-10-CM | POA: Diagnosis not present

## 2023-01-14 DIAGNOSIS — D225 Melanocytic nevi of trunk: Secondary | ICD-10-CM | POA: Diagnosis not present

## 2023-01-14 DIAGNOSIS — C44319 Basal cell carcinoma of skin of other parts of face: Secondary | ICD-10-CM | POA: Diagnosis not present

## 2023-01-14 DIAGNOSIS — L570 Actinic keratosis: Secondary | ICD-10-CM | POA: Diagnosis not present

## 2023-01-20 LAB — CUP PACEART REMOTE DEVICE CHECK
Date Time Interrogation Session: 20240930232015
Implantable Pulse Generator Implant Date: 20210610

## 2023-01-25 ENCOUNTER — Ambulatory Visit (INDEPENDENT_AMBULATORY_CARE_PROVIDER_SITE_OTHER): Payer: Medicare Other

## 2023-01-25 DIAGNOSIS — I639 Cerebral infarction, unspecified: Secondary | ICD-10-CM | POA: Diagnosis not present

## 2023-01-25 DIAGNOSIS — M87052 Idiopathic aseptic necrosis of left femur: Secondary | ICD-10-CM | POA: Diagnosis not present

## 2023-01-28 DIAGNOSIS — M150 Primary generalized (osteo)arthritis: Secondary | ICD-10-CM | POA: Diagnosis not present

## 2023-01-28 DIAGNOSIS — Z6833 Body mass index (BMI) 33.0-33.9, adult: Secondary | ICD-10-CM | POA: Diagnosis not present

## 2023-02-01 ENCOUNTER — Ambulatory Visit: Payer: Medicare Other | Attending: Cardiology | Admitting: Cardiology

## 2023-02-01 ENCOUNTER — Encounter: Payer: Self-pay | Admitting: Cardiology

## 2023-02-01 VITALS — BP 122/66 | HR 84 | Ht 66.0 in | Wt 206.4 lb

## 2023-02-01 DIAGNOSIS — I209 Angina pectoris, unspecified: Secondary | ICD-10-CM | POA: Diagnosis not present

## 2023-02-01 DIAGNOSIS — I25118 Atherosclerotic heart disease of native coronary artery with other forms of angina pectoris: Secondary | ICD-10-CM | POA: Diagnosis not present

## 2023-02-01 DIAGNOSIS — Z951 Presence of aortocoronary bypass graft: Secondary | ICD-10-CM | POA: Diagnosis not present

## 2023-02-01 DIAGNOSIS — N1831 Chronic kidney disease, stage 3a: Secondary | ICD-10-CM | POA: Diagnosis not present

## 2023-02-01 NOTE — Progress Notes (Unsigned)
Cardiology Office Note:    Date:  02/01/2023   ID:  Ernest Booker, DOB [birth year], MRN [id number]  PCP:  Philemon Kingdom, MD  Cardiologist:  Gypsy Balsam, MD    Referring MD: Philemon Kingdom, MD   Chief Complaint  Patient presents with   Medical Clearance    Dr. Jacquiline Doe ortho L hip replacement    History of Present Illness:    Ernest Booker is a 77 y.o. male  past medical history significant for coronary bypass graft done in 2018 with LIMA to LAD, SVG to diagonal, SVG to ramus, SVG to PDA. Then 6 months later starting problems again cardiac catheterization performed she will all occluded graft except LIMA to LAD since that time we have been struggling treating is an ongoing angina pectoris. He did have cardiac catheterization 2021 November which basically showed occluded graft with no target lesion for intervention additional problem include essential hypertension, dyslipidemia, recently recognized prostate cancer treated with hormonal therapy.  Ernest Booker comes today to my office to talk about potential elective hip replacement surgery.  Apparently started having a lot of pain in her hip x-ray did not show manage however MRI thereafter showed significant necrosis of the head of the hip.  Surgery is contemplated.  He denies having much chest pain tightness squeezing pressure burning chest but at the same time his ability to exercise very limited because of the pain.  Past Medical History:  Diagnosis Date   Acute on chronic diastolic CHF (congestive heart failure) (HCC) 02/25/2020   Angina pectoris (HCC)    Anginal pain (HCC)    Arthritis    Atrial fibrillation (HCC) 09/15/2017   Bilateral numbness and tingling of arms and legs 10/29/2015   Bradycardia    CAD (coronary artery disease) 09/28/2016   Cardiogenic shock (HCC)    Cervical disc disease 10/29/2015   Chest pain at rest 09/15/2017   Chronic diastolic CHF (congestive heart failure) (HCC) 02/25/2020   Chronic kidney  disease (CKD), stage III (moderate) (HCC) 09/15/2017   Coronary artery disease    Edema    Encounter for therapeutic drug monitoring 10/14/2016   History of TIA (transient ischemic attack) 07/27/2019   Hx of CABG    Hyperlipemia 09/24/2016   Hyperlipidemia    Hyperlipidemia LDL goal <70    Hypertension    Ischemic cardiomyopathy 10/22/2016   Myocardial infarction [facility])    Neck pain 10/29/2015   Non-ST elevation (NSTEMI) myocardial infarction [facility])    NSTEMI (non-ST elevated myocardial infarction) (HCC) 09/24/2016   Numbness in left leg 10/29/2015   Paroxysmal tachycardia (HCC)    Pleural effusion    Presence of aortocoronary bypass graft 10/08/2016   LIMA to LAD -SVG to DIAG SVG to RAMUS SVG to PDA  All grafts occluded except LIMA based on cardiac catheterization 07/2016   Prostate cancer (HCC)    Respiratory failure (HCC)    S/P CABG x 4 10/08/2016   LIMA to LAD -SVG to DIAG SVG to RAMUS SVG to PDA  All grafts occluded except LIMA based on cardiac catheterization 07/2016   Shortness of breath    Weakness 10/29/2015   Weakness of both arms 10/29/2015    Past Surgical History:  Procedure Laterality Date   CORONARY ARTERY BYPASS GRAFT N/A 09/28/2016   Procedure: CORONARY ARTERY BYPASS GRAFTING (CABG) x four, using left internal mammary artery and right leg greater saphenous vein harvested endoscopically;  Surgeon: Kerin Perna, MD;  Location: [facility] OR;  Service: Open Heart Surgery;  Laterality: N/A;   IR THORACENTESIS ASP PLEURAL SPACE W/IMG GUIDE  10/06/2016   LEFT HEART CATH AND CORONARY ANGIOGRAPHY N/A 09/24/2016   Procedure: Left Heart Cath and Coronary Angiography;  Surgeon: Runell Gess, MD;  Location: [facility] INVASIVE CV LAB;  Service: Cardiovascular;  Laterality: N/A;   LEFT HEART CATH AND CORS/GRAFTS ANGIOGRAPHY N/A 02/11/2017   Procedure: LEFT HEART CATH AND CORS/GRAFTS ANGIOGRAPHY;  Surgeon: Corky Crafts, MD;  Location: [facility] INVASIVE CV LAB;  Service: Cardiovascular;   Laterality: N/A;   LEFT HEART CATH AND CORS/GRAFTS ANGIOGRAPHY N/A 02/27/2020   Procedure: LEFT HEART CATH AND CORS/GRAFTS ANGIOGRAPHY;  Surgeon: Swaziland, Peter M, MD;  Location: [facility] INVASIVE CV LAB;  Service: Cardiovascular;  Laterality: N/A;   TEE WITHOUT CARDIOVERSION N/A 09/28/2016   Procedure: TRANSESOPHAGEAL ECHOCARDIOGRAM (TEE);  Surgeon: Donata Clay, Theron Arista, MD;  Location: [facility] OR;  Service: Open Heart Surgery;  Laterality: N/A;   ULTRASOUND GUIDANCE FOR VASCULAR ACCESS  02/11/2017   Procedure: Ultrasound Guidance For Vascular Access;  Surgeon: Corky Crafts, MD;  Location: [facility] INVASIVE CV LAB;  Service: Cardiovascular;;    Current Medications: Current Meds  Medication Sig   amLODipine (NORVASC) 5 MG tablet Take 5 mg by mouth daily.   furosemide (LASIX) 40 MG tablet Take 1.5 tablets (60 mg total) by mouth daily.   isosorbide mononitrate (IMDUR) 60 MG 24 hr tablet Take 4 tablets (240 mg total) by mouth daily.   nitroGLYCERIN (NITROSTAT) 0.4 MG SL tablet Place 1 tablet (0.4 mg total) under the tongue every 5 (five) minutes as needed for chest pain.   ondansetron (ZOFRAN-ODT) 4 MG disintegrating tablet Take 4 mg by mouth every 8 (eight) hours as needed for nausea.   pantoprazole (PROTONIX) 40 MG tablet Take 40 mg by mouth in the morning and at bedtime.    potassium chloride (KLOR-CON) 10 MEQ tablet Take 1 tablet (10 mEq total) by mouth daily.   ranolazine (RANEXA) 1000 MG SR tablet Take 1 tablet by mouth twice daily   rivaroxaban (XARELTO) 2.5 MG TABS tablet Take 1 tablet by mouth twice daily (Patient taking differently: Take 2.5 mg by mouth 2 (two) times daily.)   rosuvastatin (CRESTOR) 20 MG tablet Take 1 tablet by mouth once daily   traMADol (ULTRAM) 50 MG tablet Take 50 mg by mouth every 12 (twelve) hours as needed for moderate pain or severe pain.   Vitamin D, Ergocalciferol, (DRISDOL) 1.25 MG (50000 UNIT) CAPS capsule Take 50,000 Units by mouth once a week.   [DISCONTINUED] metFORMIN  (GLUCOPHAGE-XR) 500 MG 24 hr tablet Take 500 mg by mouth 2 (two) times daily with a meal.     Allergies:   Benadryl [diphenhydramine hcl (sleep)], Doxycycline, and Levofloxacin   Social History   Socioeconomic History   Marital status: Married    Spouse name: Not on file   Number of children: Not on file   Years of education: Not on file   Highest education level: Not on file  Occupational History   Occupation: Retired  Tobacco Use   Smoking status: Never   Smokeless tobacco: Never  Vaping Use   Vaping status: Never Used  Substance and Sexual Activity   Alcohol use: No   Drug use: No   Sexual activity: Yes    Birth control/protection: None  Other Topics Concern   Not on file  Social History Narrative   Not on file   Social Determinants of Health   Financial Resource Strain: Not on file  Food  Insecurity: Not on file  Transportation Needs: Not on file  Physical Activity: Not on file  Stress: Not on file  Social Connections: Not on file     Family History: The patient's family history includes Heart attack in his father; Heart failure in his mother. ROS:   Please see the history of present illness.    All 14 point review of systems negative except as described per history of present illness  EKGs/Labs/Other Studies Reviewed:         Recent Labs: 10/08/2022: ALT 15; NT-Pro BNP 194; TSH 2.360 10/23/2022: BUN 22; Creatinine, Ser 1.10; Potassium 4.1; Sodium 140  Recent Lipid Panel    Component Value Date/Time   CHOL 125 10/25/2020 0958   TRIG 111 10/25/2020 0958   HDL 53 10/25/2020 0958   CHOLHDL 2.4 10/25/2020 0958   CHOLHDL 2.8 02/27/2020 0343   VLDL 19 02/27/2020 0343   LDLCALC 52 10/25/2020 0958    Physical Exam:    VS:  BP 122/66 (BP Location: Left Arm, Patient Position: Sitting)   Pulse 84   Ht 5\' 6"  (1.676 m)   Wt 206 lb 6.4 oz (93.6 kg)   SpO2 94%   BMI 33.31 kg/m     Wt Readings from Last 3 Encounters:  02/01/23 206 lb 6.4 oz (93.6 kg)   11/09/22 216 lb 3.2 oz (98.1 kg)  10/08/22 217 lb (98.4 kg)     GEN:  Well nourished, well developed in no acute distress HEENT: Normal NECK: No JVD; No carotid bruits LYMPHATICS: No lymphadenopathy CARDIAC: RRR, no murmurs, no rubs, no gallops RESPIRATORY:  Clear to auscultation without rales, wheezing or rhonchi  ABDOMEN: Soft, non-tender, non-distended MUSCULOSKELETAL:  No edema; No deformity  SKIN: Warm and dry LOWER EXTREMITIES: no swelling NEUROLOGIC:  Alert and oriented x 3 PSYCHIATRIC:  Normal affect   ASSESSMENT:    1. Coronary artery disease of native artery of native heart with stable angina pectoris (HCC)   2. Angina pectoris (HCC)   3. Stage 3a chronic kidney disease (HCC)   4. S/P CABG x 4    PLAN:    In order of problems listed above:  Coronary to disease he does have occluded 3 grafts out of 4.  He does have significant distal disease.  He is here to be evaluated before elective hip replacement surgery.  Overall that surgery is considered relatively low risk however anesthesia which will be spinal with sedation carry some significant risk.  I will consider him high risk and I explained to him the rationale for that decision.  I would definitely suggest to explore known surgical way to help with the pain of the hip he is taking tramadol right now he may need to see pain clinic to help with this.  Overall sound horribly difficult situation.  It clearly affect quality of life for him.  However even before he problem he was living fairly sedentary lifestyle. Angina pectoris denies having any but I think the reason for that is the fact he does not do much right now. History of penetrating ulcer in the aorta.  Following up by [facility]. Dyslipidemia.  I did review K PN which show me his data from 2022.  I will call primary care physician to get more updated information about that.  In the meantime we will continue with Crestor 20 Prostate cancer: On hormonal  therapy.   Medication Adjustments/Labs and Tests Ordered: Current medicines are reviewed at length with the patient today.  Concerns  regarding medicines are outlined above.  Orders Placed This Encounter  Procedures   EKG 12-Lead   Medication changes: No orders of the defined types were placed in this encounter.   Signed, Georgeanna Lea, MD, [facility] 02/01/2023 1:54 PM    [facility]

## 2023-02-01 NOTE — Patient Instructions (Signed)
Medication Instructions:  Your physician recommends that you continue on your current medications as directed. Please refer to the Current Medication list given to you today.  *If you need a refill on your cardiac medications before your next appointment, please call your pharmacy*   Lab Work: None Ordered If you have labs (blood work) drawn today and your tests are completely normal, you will receive your results only by: MyChart Message (if you have MyChart) OR A paper copy in the mail If you have any lab test that is abnormal or we need to change your treatment, we will call you to review the results.   Testing/Procedures: None Ordered   Follow-Up: At [facility], you and your health needs are our priority.  As part of our continuing mission to provide you with exceptional heart care, we have created designated Provider Care Teams.  These Care Teams include your primary Cardiologist (physician) and Advanced Practice Providers (APPs -  Physician Assistants and Nurse Practitioners) who all work together to provide you with the care you need, when you need it.  We recommend signing up for the patient portal called "MyChart".  Sign up information is provided on this After Visit Summary.  MyChart is used to connect with patients for Virtual Visits (Telemedicine).  Patients are able to view lab/test results, encounter notes, upcoming appointments, etc.  Non-urgent messages can be sent to your provider as well.   To learn more about what you can do with MyChart, go to [email or web address].    Your next appointment:   2 month(s)  The format for your next appointment:   In Person  Provider:   Gypsy Balsam, MD    Other Instructions NA

## 2023-02-06 ENCOUNTER — Other Ambulatory Visit: Payer: Self-pay | Admitting: Cardiology

## 2023-02-08 DIAGNOSIS — E538 Deficiency of other specified B group vitamins: Secondary | ICD-10-CM | POA: Diagnosis not present

## 2023-02-10 NOTE — Progress Notes (Signed)
Carelink Summary Report / Loop Recorder 

## 2023-02-15 DIAGNOSIS — H00012 Hordeolum externum right lower eyelid: Secondary | ICD-10-CM | POA: Diagnosis not present

## 2023-02-15 DIAGNOSIS — R7303 Prediabetes: Secondary | ICD-10-CM | POA: Diagnosis not present

## 2023-02-15 DIAGNOSIS — H5319 Other subjective visual disturbances: Secondary | ICD-10-CM | POA: Diagnosis not present

## 2023-02-15 DIAGNOSIS — H26493 Other secondary cataract, bilateral: Secondary | ICD-10-CM | POA: Diagnosis not present

## 2023-02-25 ENCOUNTER — Telehealth: Payer: Self-pay

## 2023-02-25 NOTE — Telephone Encounter (Signed)
Alert remote transmission: The device has reached RRT on 02/21/2023 Sent to triage Follow up as scheduled.  ML, CVRS   Called all available phone numbers. Unable to leave VM.

## 2023-02-26 NOTE — Telephone Encounter (Signed)
Spoke with patient. Notified. Sending to ep scheduler to discuss replacement/removal

## 2023-03-01 ENCOUNTER — Ambulatory Visit: Payer: Medicare Other

## 2023-03-02 DIAGNOSIS — R569 Unspecified convulsions: Secondary | ICD-10-CM | POA: Diagnosis not present

## 2023-03-02 DIAGNOSIS — Z1379 Encounter for other screening for genetic and chromosomal anomalies: Secondary | ICD-10-CM | POA: Diagnosis not present

## 2023-03-02 DIAGNOSIS — Z820 Family history of epilepsy and other diseases of the nervous system: Secondary | ICD-10-CM | POA: Diagnosis not present

## 2023-03-02 DIAGNOSIS — R258 Other abnormal involuntary movements: Secondary | ICD-10-CM | POA: Diagnosis not present

## 2023-03-02 DIAGNOSIS — G40401 Other generalized epilepsy and epileptic syndromes, not intractable, with status epilepticus: Secondary | ICD-10-CM | POA: Diagnosis not present

## 2023-03-08 DIAGNOSIS — E538 Deficiency of other specified B group vitamins: Secondary | ICD-10-CM | POA: Diagnosis not present

## 2023-03-09 DIAGNOSIS — I719 Aortic aneurysm of unspecified site, without rupture: Secondary | ICD-10-CM | POA: Diagnosis not present

## 2023-03-16 DIAGNOSIS — Z136 Encounter for screening for cardiovascular disorders: Secondary | ICD-10-CM | POA: Diagnosis not present

## 2023-03-16 DIAGNOSIS — Z8489 Family history of other specified conditions: Secondary | ICD-10-CM | POA: Diagnosis not present

## 2023-03-16 DIAGNOSIS — I745 Embolism and thrombosis of iliac artery: Secondary | ICD-10-CM | POA: Diagnosis not present

## 2023-03-16 DIAGNOSIS — I719 Aortic aneurysm of unspecified site, without rupture: Secondary | ICD-10-CM | POA: Diagnosis not present

## 2023-03-17 DIAGNOSIS — M25552 Pain in left hip: Secondary | ICD-10-CM | POA: Diagnosis not present

## 2023-03-22 DIAGNOSIS — E782 Mixed hyperlipidemia: Secondary | ICD-10-CM | POA: Diagnosis not present

## 2023-03-22 DIAGNOSIS — I255 Ischemic cardiomyopathy: Secondary | ICD-10-CM | POA: Diagnosis not present

## 2023-03-22 DIAGNOSIS — I719 Aortic aneurysm of unspecified site, without rupture: Secondary | ICD-10-CM | POA: Diagnosis not present

## 2023-03-22 DIAGNOSIS — I480 Paroxysmal atrial fibrillation: Secondary | ICD-10-CM | POA: Diagnosis not present

## 2023-03-26 ENCOUNTER — Ambulatory Visit (INDEPENDENT_AMBULATORY_CARE_PROVIDER_SITE_OTHER): Payer: Medicare Other

## 2023-03-26 DIAGNOSIS — I255 Ischemic cardiomyopathy: Secondary | ICD-10-CM

## 2023-03-26 DIAGNOSIS — M25552 Pain in left hip: Secondary | ICD-10-CM | POA: Diagnosis not present

## 2023-03-26 LAB — CUP PACEART REMOTE DEVICE CHECK
Date Time Interrogation Session: 20241205222544
Implantable Pulse Generator Implant Date: 20210610

## 2023-03-29 DIAGNOSIS — I25709 Atherosclerosis of coronary artery bypass graft(s), unspecified, with unspecified angina pectoris: Secondary | ICD-10-CM | POA: Diagnosis not present

## 2023-03-29 DIAGNOSIS — E559 Vitamin D deficiency, unspecified: Secondary | ICD-10-CM | POA: Diagnosis not present

## 2023-03-29 DIAGNOSIS — I10 Essential (primary) hypertension: Secondary | ICD-10-CM | POA: Diagnosis not present

## 2023-03-29 DIAGNOSIS — G8929 Other chronic pain: Secondary | ICD-10-CM | POA: Diagnosis not present

## 2023-03-29 DIAGNOSIS — M150 Primary generalized (osteo)arthritis: Secondary | ICD-10-CM | POA: Diagnosis not present

## 2023-03-29 DIAGNOSIS — M25562 Pain in left knee: Secondary | ICD-10-CM | POA: Diagnosis not present

## 2023-03-29 DIAGNOSIS — Z79899 Other long term (current) drug therapy: Secondary | ICD-10-CM | POA: Diagnosis not present

## 2023-03-29 DIAGNOSIS — Z6833 Body mass index (BMI) 33.0-33.9, adult: Secondary | ICD-10-CM | POA: Diagnosis not present

## 2023-03-29 DIAGNOSIS — M87052 Idiopathic aseptic necrosis of left femur: Secondary | ICD-10-CM | POA: Diagnosis not present

## 2023-03-29 DIAGNOSIS — E1169 Type 2 diabetes mellitus with other specified complication: Secondary | ICD-10-CM | POA: Diagnosis not present

## 2023-03-29 DIAGNOSIS — E785 Hyperlipidemia, unspecified: Secondary | ICD-10-CM | POA: Diagnosis not present

## 2023-03-29 DIAGNOSIS — K219 Gastro-esophageal reflux disease without esophagitis: Secondary | ICD-10-CM | POA: Diagnosis not present

## 2023-03-31 ENCOUNTER — Telehealth: Payer: Self-pay

## 2023-03-31 DIAGNOSIS — C61 Malignant neoplasm of prostate: Secondary | ICD-10-CM | POA: Diagnosis not present

## 2023-03-31 DIAGNOSIS — N3289 Other specified disorders of bladder: Secondary | ICD-10-CM | POA: Diagnosis not present

## 2023-03-31 NOTE — Telephone Encounter (Signed)
Pt previously made aware of loop RRT.  Pt has upcoming appointment to discuss loop removal.  ILR reached RRT:   03/22/2023  Marked "I" in Paceart: done Enter note in Paceart:  done Canceled future remotes:  done Discontinued from website:  done Entered in Speciality Comments:  done  Advised if further questions arise to please call the device clinic at [phone].

## 2023-04-05 ENCOUNTER — Ambulatory Visit: Payer: Medicare Other

## 2023-04-05 ENCOUNTER — Encounter: Payer: Self-pay | Admitting: Cardiology

## 2023-04-05 DIAGNOSIS — E538 Deficiency of other specified B group vitamins: Secondary | ICD-10-CM | POA: Diagnosis not present

## 2023-04-07 ENCOUNTER — Other Ambulatory Visit: Payer: Self-pay | Admitting: Cardiology

## 2023-04-08 ENCOUNTER — Encounter: Payer: Self-pay | Admitting: Cardiology

## 2023-04-08 ENCOUNTER — Ambulatory Visit: Payer: Medicare Other | Attending: Cardiology | Admitting: Cardiology

## 2023-04-08 VITALS — BP 122/76 | HR 68 | Ht 66.0 in | Wt 213.0 lb

## 2023-04-08 DIAGNOSIS — I25118 Atherosclerotic heart disease of native coronary artery with other forms of angina pectoris: Secondary | ICD-10-CM | POA: Diagnosis not present

## 2023-04-08 DIAGNOSIS — Z951 Presence of aortocoronary bypass graft: Secondary | ICD-10-CM | POA: Diagnosis not present

## 2023-04-08 DIAGNOSIS — I719 Aortic aneurysm of unspecified site, without rupture: Secondary | ICD-10-CM | POA: Insufficient documentation

## 2023-04-08 DIAGNOSIS — I5032 Chronic diastolic (congestive) heart failure: Secondary | ICD-10-CM | POA: Insufficient documentation

## 2023-04-08 MED ORDER — CLOPIDOGREL BISULFATE 75 MG PO TABS: 75.0000 mg | ORAL_TABLET | Freq: Every day | ORAL | 3 refills | Status: DC

## 2023-04-08 NOTE — Progress Notes (Signed)
Cardiology Office Note:    Date:  04/08/2023   ID:  Ernest Booker, DOB 1945/06/02, MRN [id number]  PCP:  Philemon Kingdom, MD  Cardiologist:  Gypsy Balsam, MD    Referring MD: Philemon Kingdom, MD   No chief complaint on file.   History of Present Illness:    Ernest Booker is a 77 y.o. male past medical history significant for coronary bypass graft in 2018 with LIMA to LAD SVG to diagonal SVG to ramus SVG PDA.  6 months later he OCCLUDED except LIMA to LAD.  Since that time he struggling with ongoing angina pectoris.  Maximal medical therapy has been accomplished.  He did have cardiac catheterization done in 2021 November which showed basically occluded all grafts and no target lesion for intervention.  Additional problem include essential hypertension dyslipidemia prostate cancer treated with hormonal therapy.  Also recently was found to have significant hip problem and the surgery contemplated but because of his comorbidity that Minerva Areola was considered high risk and for now at least surgery is on hold.  Comes today like always with his wife.  It is interesting a week ago he got excellent week he was able to walk she show me some video when he was walking the store and he was walking quite fast quite good.  But today he feels weak tired exhausted  Past Medical History:  Diagnosis Date   Acute on chronic diastolic CHF (congestive heart failure) (HCC) 02/25/2020   Angina pectoris (HCC)    Anginal pain (HCC)    Arthritis    Atrial fibrillation (HCC) 09/15/2017   Bilateral numbness and tingling of arms and legs 10/29/2015   Bradycardia    CAD (coronary artery disease) 09/28/2016   Cardiogenic shock (HCC)    Cervical disc disease 10/29/2015   Chest pain at rest 09/15/2017   Chronic diastolic CHF (congestive heart failure) (HCC) 02/25/2020   Chronic kidney disease (CKD), stage III (moderate) (HCC) 09/15/2017   Coronary artery disease    Edema    Encounter for therapeutic drug monitoring  10/14/2016   History of TIA (transient ischemic attack) 07/27/2019   Hx of CABG    Hyperlipemia 09/24/2016   Hyperlipidemia    Hyperlipidemia LDL goal <70    Hypertension    Ischemic cardiomyopathy 10/22/2016   Myocardial infarction [facility])    Neck pain 10/29/2015   Non-ST elevation (NSTEMI) myocardial infarction [facility])    NSTEMI (non-ST elevated myocardial infarction) (HCC) 09/24/2016   Numbness in left leg 10/29/2015   Paroxysmal tachycardia (HCC)    Penetrating ulcer of aorta (HCC) 10/08/2022   Pleural effusion    Presence of aortocoronary bypass graft 10/08/2016   LIMA to LAD -SVG to DIAG SVG to RAMUS SVG to PDA  All grafts occluded except LIMA based on cardiac catheterization 07/2016   Prostate cancer (HCC)    Respiratory failure (HCC)    S/P CABG x 4 10/08/2016   LIMA to LAD -SVG to DIAG SVG to RAMUS SVG to PDA  All grafts occluded except LIMA based on cardiac catheterization 07/2016   Shortness of breath    Weakness 10/29/2015   Weakness of both arms 10/29/2015    Past Surgical History:  Procedure Laterality Date   CORONARY ARTERY BYPASS GRAFT N/A 09/28/2016   Procedure: CORONARY ARTERY BYPASS GRAFTING (CABG) x four, using left internal mammary artery and right leg greater saphenous vein harvested endoscopically;  Surgeon: Kerin Perna, MD;  Location: [facility] OR;  Service: Open Heart Surgery;  Laterality: N/A;  IR THORACENTESIS ASP PLEURAL SPACE W/IMG GUIDE  10/06/2016   LEFT HEART CATH AND CORONARY ANGIOGRAPHY N/A 09/24/2016   Procedure: Left Heart Cath and Coronary Angiography;  Surgeon: Runell Gess, MD;  Location: [facility] INVASIVE CV LAB;  Service: Cardiovascular;  Laterality: N/A;   LEFT HEART CATH AND CORS/GRAFTS ANGIOGRAPHY N/A 02/11/2017   Procedure: LEFT HEART CATH AND CORS/GRAFTS ANGIOGRAPHY;  Surgeon: Corky Crafts, MD;  Location: [facility] INVASIVE CV LAB;  Service: Cardiovascular;  Laterality: N/A;   LEFT HEART CATH AND CORS/GRAFTS ANGIOGRAPHY N/A 02/27/2020    Procedure: LEFT HEART CATH AND CORS/GRAFTS ANGIOGRAPHY;  Surgeon: Swaziland, Peter M, MD;  Location: [facility] INVASIVE CV LAB;  Service: Cardiovascular;  Laterality: N/A;   TEE WITHOUT CARDIOVERSION N/A 09/28/2016   Procedure: TRANSESOPHAGEAL ECHOCARDIOGRAM (TEE);  Surgeon: Donata Clay, Theron Arista, MD;  Location: [facility] OR;  Service: Open Heart Surgery;  Laterality: N/A;   ULTRASOUND GUIDANCE FOR VASCULAR ACCESS  02/11/2017   Procedure: Ultrasound Guidance For Vascular Access;  Surgeon: Corky Crafts, MD;  Location: [facility] INVASIVE CV LAB;  Service: Cardiovascular;;    Current Medications: Current Meds  Medication Sig   amLODipine (NORVASC) 5 MG tablet Take 5 mg by mouth daily.   furosemide (LASIX) 40 MG tablet Take 1.5 tablets (60 mg total) by mouth daily.   isosorbide mononitrate (IMDUR) 60 MG 24 hr tablet Take 4 tablets (240 mg total) by mouth daily.   levothyroxine (SYNTHROID) 25 MCG tablet Take 25 mcg by mouth daily.   nitroGLYCERIN (NITROSTAT) 0.4 MG SL tablet Place 1 tablet (0.4 mg total) under the tongue every 5 (five) minutes as needed for chest pain.   ondansetron (ZOFRAN-ODT) 4 MG disintegrating tablet Take 4 mg by mouth every 8 (eight) hours as needed for nausea.   pantoprazole (PROTONIX) 40 MG tablet Take 40 mg by mouth in the morning and at bedtime.    potassium chloride (KLOR-CON) 10 MEQ tablet Take 1 tablet (10 mEq total) by mouth daily.   ranolazine (RANEXA) 1000 MG SR tablet Take 1 tablet by mouth twice daily   rivaroxaban (XARELTO) 2.5 MG TABS tablet Take 1 tablet by mouth twice daily   rosuvastatin (CRESTOR) 20 MG tablet Take 1 tablet by mouth once daily   traMADol (ULTRAM) 50 MG tablet Take 100 mg by mouth 2 (two) times daily.   Vitamin D, Ergocalciferol, (DRISDOL) 1.25 MG (50000 UNIT) CAPS capsule Take 50,000 Units by mouth once a week.     Allergies:   Benadryl [diphenhydramine hcl (sleep)], Doxycycline, and Levofloxacin   Social History   Socioeconomic History   Marital status:  Married    Spouse name: Not on file   Number of children: Not on file   Years of education: Not on file   Highest education level: Not on file  Occupational History   Occupation: Retired  Tobacco Use   Smoking status: Never   Smokeless tobacco: Never  Vaping Use   Vaping status: Never Used  Substance and Sexual Activity   Alcohol use: No   Drug use: No   Sexual activity: Yes    Birth control/protection: None  Other Topics Concern   Not on file  Social History Narrative   Not on file   Social Drivers of Health   Financial Resource Strain: Not on file  Food Insecurity: Not on file  Transportation Needs: Not on file  Physical Activity: Not on file  Stress: Not on file  Social Connections: Not on file     Family History:  The patient's family history includes Heart attack in his father; Heart failure in his mother. ROS:   Please see the history of present illness.    All 14 point review of systems negative except as described per history of present illness  EKGs/Labs/Other Studies Reviewed:         Recent Labs: 10/08/2022: ALT 15; NT-Pro BNP 194; TSH 2.360 10/23/2022: BUN 22; Creatinine, Ser 1.10; Potassium 4.1; Sodium 140  Recent Lipid Panel    Component Value Date/Time   CHOL 125 10/25/2020 0958   TRIG 111 10/25/2020 0958   HDL 53 10/25/2020 0958   CHOLHDL 2.4 10/25/2020 0958   CHOLHDL 2.8 02/27/2020 0343   VLDL 19 02/27/2020 0343   LDLCALC 52 10/25/2020 0958    Physical Exam:    VS:  BP 122/76   Pulse 68   Ht 5\' 6"  (1.676 m)   Wt 213 lb (96.6 kg)   SpO2 95%   BMI 34.38 kg/m     Wt Readings from Last 3 Encounters:  04/08/23 213 lb (96.6 kg)  02/01/23 206 lb 6.4 oz (93.6 kg)  11/09/22 216 lb 3.2 oz (98.1 kg)     GEN:  Well nourished, well developed in no acute distress HEENT: Normal NECK: No JVD; No carotid bruits LYMPHATICS: No lymphadenopathy CARDIAC: RRR, no murmurs, no rubs, no gallops RESPIRATORY:  Clear to auscultation without rales,  wheezing or rhonchi  ABDOMEN: Soft, non-tender, non-distended MUSCULOSKELETAL:  No edema; No deformity  SKIN: Warm and dry LOWER EXTREMITIES: no swelling NEUROLOGIC:  Alert and oriented x 3 PSYCHIATRIC:  Normal affect   ASSESSMENT:    1. Coronary artery disease of native artery of native heart with stable angina pectoris (HCC)   2. Chronic diastolic CHF (congestive heart failure) (HCC)   3. Penetrating ulcer of aorta (HCC)   4. Hx of CABG    PLAN:    In order of problems listed above:  Coronary disease stable from that point review on guideline directed medical therapy.  Will put him back on antiplatelet therapy in form of Plavix 75 mg daily.  He is taking Xarelto already which I will continue. Platelets are also of the artery.  That being followed by Onalee Hua. Dyslipidemia I did review K PN which show me his LDL 52 HDL 53 we will continue present management which include Crestor 20. There is some issue about potentially having adrenal insufficiency will do random cortisol level   Medication Adjustments/Labs and Tests Ordered: Current medicines are reviewed at length with the patient today.  Concerns regarding medicines are outlined above.  No orders of the defined types were placed in this encounter.  Medication changes: No orders of the defined types were placed in this encounter.   Signed, Georgeanna Lea, MD, [facility] 04/08/2023 11:28 AM    [facility]

## 2023-04-08 NOTE — Patient Instructions (Signed)
Medication Instructions:  Your physician has recommended you make the following change in your medication:   START: Plavix 75 mg daily  *If you need a refill on your cardiac medications before your next appointment, please call your pharmacy*   Lab Work: Your physician recommends that you return for lab work in:   Labs today: Cortisol level  If you have labs (blood work) drawn today and your tests are completely normal, you will receive your results only by: MyChart Message (if you have MyChart) OR A paper copy in the mail If you have any lab test that is abnormal or we need to change your treatment, we will call you to review the results.   Testing/Procedures: None   Follow-Up: At [facility], you and your health needs are our priority.  As part of our continuing mission to provide you with exceptional heart care, we have created designated Provider Care Teams.  These Care Teams include your primary Cardiologist (physician) and Advanced Practice Providers (APPs -  Physician Assistants and Nurse Practitioners) who all work together to provide you with the care you need, when you need it.  We recommend signing up for the patient portal called "MyChart".  Sign up information is provided on this After Visit Summary.  MyChart is used to connect with patients for Virtual Visits (Telemedicine).  Patients are able to view lab/test results, encounter notes, upcoming appointments, etc.  Non-urgent messages can be sent to your provider as well.   To learn more about what you can do with MyChart, go to [email or web address].    Your next appointment:   3 month(s)  Provider:   Gypsy Balsam, MD    Other Instructions None

## 2023-04-08 NOTE — Addendum Note (Signed)
Addended by: Roxanne Mins I on: 04/08/2023 11:38 AM   Modules accepted: Orders

## 2023-04-09 ENCOUNTER — Ambulatory Visit: Payer: Medicare Other | Admitting: Cardiology

## 2023-04-09 DIAGNOSIS — M25552 Pain in left hip: Secondary | ICD-10-CM | POA: Diagnosis not present

## 2023-04-09 LAB — CORTISOL: Cortisol: 4 ug/dL — ABNORMAL LOW (ref 6.2–19.4)

## 2023-04-12 ENCOUNTER — Ambulatory Visit: Payer: Medicare Other | Admitting: Cardiology

## 2023-04-12 DIAGNOSIS — M25512 Pain in left shoulder: Secondary | ICD-10-CM | POA: Diagnosis not present

## 2023-04-12 DIAGNOSIS — G8929 Other chronic pain: Secondary | ICD-10-CM | POA: Diagnosis not present

## 2023-04-26 DIAGNOSIS — G8929 Other chronic pain: Secondary | ICD-10-CM | POA: Diagnosis not present

## 2023-04-26 DIAGNOSIS — M25511 Pain in right shoulder: Secondary | ICD-10-CM | POA: Diagnosis not present

## 2023-04-29 DIAGNOSIS — I4891 Unspecified atrial fibrillation: Secondary | ICD-10-CM | POA: Diagnosis not present

## 2023-04-29 DIAGNOSIS — E118 Type 2 diabetes mellitus with unspecified complications: Secondary | ICD-10-CM | POA: Diagnosis not present

## 2023-04-29 DIAGNOSIS — R079 Chest pain, unspecified: Secondary | ICD-10-CM | POA: Diagnosis not present

## 2023-04-29 DIAGNOSIS — R9431 Abnormal electrocardiogram [ECG] [EKG]: Secondary | ICD-10-CM | POA: Diagnosis not present

## 2023-04-29 DIAGNOSIS — I444 Left anterior fascicular block: Secondary | ICD-10-CM | POA: Diagnosis not present

## 2023-04-29 DIAGNOSIS — Z7901 Long term (current) use of anticoagulants: Secondary | ICD-10-CM | POA: Diagnosis not present

## 2023-04-29 DIAGNOSIS — I2089 Other forms of angina pectoris: Secondary | ICD-10-CM | POA: Diagnosis not present

## 2023-04-29 DIAGNOSIS — E785 Hyperlipidemia, unspecified: Secondary | ICD-10-CM | POA: Diagnosis not present

## 2023-04-29 DIAGNOSIS — Z79899 Other long term (current) drug therapy: Secondary | ICD-10-CM | POA: Diagnosis not present

## 2023-04-29 DIAGNOSIS — Z7902 Long term (current) use of antithrombotics/antiplatelets: Secondary | ICD-10-CM | POA: Diagnosis not present

## 2023-04-29 DIAGNOSIS — I10 Essential (primary) hypertension: Secondary | ICD-10-CM | POA: Diagnosis not present

## 2023-04-29 DIAGNOSIS — I2510 Atherosclerotic heart disease of native coronary artery without angina pectoris: Secondary | ICD-10-CM | POA: Diagnosis not present

## 2023-04-30 ENCOUNTER — Ambulatory Visit: Payer: Medicare Other

## 2023-04-30 ENCOUNTER — Telehealth: Payer: Self-pay | Admitting: Cardiology

## 2023-04-30 DIAGNOSIS — R079 Chest pain, unspecified: Secondary | ICD-10-CM | POA: Diagnosis not present

## 2023-04-30 DIAGNOSIS — I10 Essential (primary) hypertension: Secondary | ICD-10-CM | POA: Diagnosis not present

## 2023-04-30 DIAGNOSIS — E118 Type 2 diabetes mellitus with unspecified complications: Secondary | ICD-10-CM | POA: Diagnosis not present

## 2023-04-30 NOTE — Telephone Encounter (Signed)
 Dr Madireddy would like for patient to have a two week follow up with Dr. Bing Matter post hospital stay.  Called patient, unable to leave message.  Thank you KBL 04/30/23

## 2023-05-03 DIAGNOSIS — E538 Deficiency of other specified B group vitamins: Secondary | ICD-10-CM | POA: Diagnosis not present

## 2023-05-04 NOTE — Telephone Encounter (Signed)
 Called patient, unable to leave message./kbl 05/04/23

## 2023-05-06 NOTE — Telephone Encounter (Signed)
CALLED PATIENT/NP VM AVAILABLE/KBL 05/06/2023

## 2023-05-10 ENCOUNTER — Ambulatory Visit: Payer: Medicare Other

## 2023-05-24 DIAGNOSIS — E538 Deficiency of other specified B group vitamins: Secondary | ICD-10-CM | POA: Diagnosis not present

## 2023-05-27 DIAGNOSIS — L219 Seborrheic dermatitis, unspecified: Secondary | ICD-10-CM | POA: Diagnosis not present

## 2023-05-27 DIAGNOSIS — L570 Actinic keratosis: Secondary | ICD-10-CM | POA: Diagnosis not present

## 2023-05-28 ENCOUNTER — Other Ambulatory Visit: Payer: Self-pay | Admitting: Cardiology

## 2023-06-03 ENCOUNTER — Ambulatory Visit: Payer: Medicare Other | Admitting: Cardiology

## 2023-06-04 ENCOUNTER — Ambulatory Visit: Payer: Medicare Other

## 2023-06-14 ENCOUNTER — Ambulatory Visit: Payer: Medicare Other

## 2023-06-21 DIAGNOSIS — R5382 Chronic fatigue, unspecified: Secondary | ICD-10-CM | POA: Diagnosis not present

## 2023-06-21 DIAGNOSIS — I10 Essential (primary) hypertension: Secondary | ICD-10-CM | POA: Diagnosis not present

## 2023-06-21 DIAGNOSIS — R7989 Other specified abnormal findings of blood chemistry: Secondary | ICD-10-CM | POA: Diagnosis not present

## 2023-06-21 DIAGNOSIS — R7303 Prediabetes: Secondary | ICD-10-CM | POA: Diagnosis not present

## 2023-06-21 DIAGNOSIS — E039 Hypothyroidism, unspecified: Secondary | ICD-10-CM | POA: Diagnosis not present

## 2023-06-21 DIAGNOSIS — M7918 Myalgia, other site: Secondary | ICD-10-CM | POA: Diagnosis not present

## 2023-06-21 DIAGNOSIS — E782 Mixed hyperlipidemia: Secondary | ICD-10-CM | POA: Diagnosis not present

## 2023-06-21 DIAGNOSIS — E538 Deficiency of other specified B group vitamins: Secondary | ICD-10-CM | POA: Diagnosis not present

## 2023-06-23 DIAGNOSIS — R5382 Chronic fatigue, unspecified: Secondary | ICD-10-CM | POA: Diagnosis not present

## 2023-06-23 DIAGNOSIS — M7918 Myalgia, other site: Secondary | ICD-10-CM | POA: Diagnosis not present

## 2023-06-23 DIAGNOSIS — E039 Hypothyroidism, unspecified: Secondary | ICD-10-CM | POA: Diagnosis not present

## 2023-06-23 DIAGNOSIS — R7989 Other specified abnormal findings of blood chemistry: Secondary | ICD-10-CM | POA: Diagnosis not present

## 2023-06-28 DIAGNOSIS — E1169 Type 2 diabetes mellitus with other specified complication: Secondary | ICD-10-CM | POA: Diagnosis not present

## 2023-06-28 DIAGNOSIS — Z79899 Other long term (current) drug therapy: Secondary | ICD-10-CM | POA: Diagnosis not present

## 2023-06-28 DIAGNOSIS — I10 Essential (primary) hypertension: Secondary | ICD-10-CM | POA: Diagnosis not present

## 2023-06-28 DIAGNOSIS — M150 Primary generalized (osteo)arthritis: Secondary | ICD-10-CM | POA: Diagnosis not present

## 2023-06-28 DIAGNOSIS — E2749 Other adrenocortical insufficiency: Secondary | ICD-10-CM | POA: Diagnosis not present

## 2023-06-28 DIAGNOSIS — I25709 Atherosclerosis of coronary artery bypass graft(s), unspecified, with unspecified angina pectoris: Secondary | ICD-10-CM | POA: Diagnosis not present

## 2023-06-28 DIAGNOSIS — E785 Hyperlipidemia, unspecified: Secondary | ICD-10-CM | POA: Diagnosis not present

## 2023-06-28 DIAGNOSIS — Z6833 Body mass index (BMI) 33.0-33.9, adult: Secondary | ICD-10-CM | POA: Diagnosis not present

## 2023-06-28 DIAGNOSIS — K219 Gastro-esophageal reflux disease without esophagitis: Secondary | ICD-10-CM | POA: Diagnosis not present

## 2023-07-05 DIAGNOSIS — L280 Lichen simplex chronicus: Secondary | ICD-10-CM | POA: Diagnosis not present

## 2023-07-05 DIAGNOSIS — L570 Actinic keratosis: Secondary | ICD-10-CM | POA: Diagnosis not present

## 2023-07-05 DIAGNOSIS — L820 Inflamed seborrheic keratosis: Secondary | ICD-10-CM | POA: Diagnosis not present

## 2023-07-09 ENCOUNTER — Ambulatory Visit: Payer: Medicare Other

## 2023-07-14 ENCOUNTER — Encounter: Payer: Self-pay | Admitting: Cardiology

## 2023-07-14 ENCOUNTER — Ambulatory Visit: Payer: Medicare Other | Attending: Cardiology | Admitting: Cardiology

## 2023-07-14 VITALS — BP 116/64 | HR 75 | Ht 66.0 in | Wt 215.0 lb

## 2023-07-14 DIAGNOSIS — I10 Essential (primary) hypertension: Secondary | ICD-10-CM | POA: Diagnosis not present

## 2023-07-14 DIAGNOSIS — Z951 Presence of aortocoronary bypass graft: Secondary | ICD-10-CM | POA: Diagnosis not present

## 2023-07-14 DIAGNOSIS — I25118 Atherosclerotic heart disease of native coronary artery with other forms of angina pectoris: Secondary | ICD-10-CM | POA: Diagnosis not present

## 2023-07-14 DIAGNOSIS — E785 Hyperlipidemia, unspecified: Secondary | ICD-10-CM | POA: Insufficient documentation

## 2023-07-14 NOTE — Progress Notes (Signed)
 Cardiology Office Note:    Date:  07/14/2023   ID:  Ernest Booker, DOB [birth year], MRN [id number]  PCP:  Philemon Kingdom, MD  Cardiologist:  Gypsy Balsam, MD    Referring MD: Philemon Kingdom, MD   Chief Complaint  Patient presents with   Follow-up    History of Present Illness:    Ernest Booker is a 78 y.o. male  past medical history significant for coronary bypass graft in 2018 with LIMA to LAD SVG to diagonal SVG to ramus SVG PDA. 6 months later he OCCLUDED except LIMA to LAD. Since that time he struggling with ongoing angina pectoris. Maximal medical therapy has been accomplished. He did have cardiac catheterization done in 2021 November which showed basically occluded all grafts and no target lesion for intervention. Additional problem include essential hypertension dyslipidemia prostate cancer treated with hormonal therapy. Also recently was found to have significant hip problem and the surgery contemplated but because of his comorbidity tha  Comes today to months for follow-up complaining being weak tired exhausted does some issue about potentially having adrenal insufficiency he was receiving hydrocortisone shots but cannot tell much difference.  Spent day sitting at home.  No chest pain tightness squeezing pressure burning chest just profound fatigue and tiredness  Past Medical History:  Diagnosis Date   Acute on chronic diastolic CHF (congestive heart failure) (HCC) 02/25/2020   Angina pectoris (HCC)    Anginal pain (HCC)    Arthritis    Atrial fibrillation (HCC) 09/15/2017   Bilateral numbness and tingling of arms and legs 10/29/2015   Bradycardia    CAD (coronary artery disease) 09/28/2016   Cardiogenic shock (HCC)    Cervical disc disease 10/29/2015   Chest pain at rest 09/15/2017   Chronic diastolic CHF (congestive heart failure) (HCC) 02/25/2020   Chronic kidney disease (CKD), stage III (moderate) (HCC) 09/15/2017   Coronary artery disease    Edema     Encounter for therapeutic drug monitoring 10/14/2016   History of TIA (transient ischemic attack) 07/27/2019   Hx of CABG    Hyperlipemia 09/24/2016   Hyperlipidemia    Hyperlipidemia LDL goal <70    Hypertension    Ischemic cardiomyopathy 10/22/2016   Myocardial infarction [facility])    Neck pain 10/29/2015   Non-ST elevation (NSTEMI) myocardial infarction [facility])    NSTEMI (non-ST elevated myocardial infarction) (HCC) 09/24/2016   Numbness in left leg 10/29/2015   Paroxysmal tachycardia (HCC)    Penetrating ulcer of aorta (HCC) 10/08/2022   Pleural effusion    Presence of aortocoronary bypass graft 10/08/2016   LIMA to LAD -SVG to DIAG SVG to RAMUS SVG to PDA  All grafts occluded except LIMA based on cardiac catheterization 07/2016   Prostate cancer (HCC)    Respiratory failure (HCC)    S/P CABG x 4 10/08/2016   LIMA to LAD -SVG to DIAG SVG to RAMUS SVG to PDA  All grafts occluded except LIMA based on cardiac catheterization 07/2016   Shortness of breath    Weakness 10/29/2015   Weakness of both arms 10/29/2015    Past Surgical History:  Procedure Laterality Date   CORONARY ARTERY BYPASS GRAFT N/A 09/28/2016   Procedure: CORONARY ARTERY BYPASS GRAFTING (CABG) x four, using left internal mammary artery and right leg greater saphenous vein harvested endoscopically;  Surgeon: Kerin Perna, MD;  Location: [facility] OR;  Service: Open Heart Surgery;  Laterality: N/A;   IR THORACENTESIS ASP PLEURAL SPACE W/IMG GUIDE  10/06/2016   LEFT HEART  CATH AND CORONARY ANGIOGRAPHY N/A 09/24/2016   Procedure: Left Heart Cath and Coronary Angiography;  Surgeon: Runell Gess, MD;  Location: [facility] INVASIVE CV LAB;  Service: Cardiovascular;  Laterality: N/A;   LEFT HEART CATH AND CORS/GRAFTS ANGIOGRAPHY N/A 02/11/2017   Procedure: LEFT HEART CATH AND CORS/GRAFTS ANGIOGRAPHY;  Surgeon: Corky Crafts, MD;  Location: [facility] INVASIVE CV LAB;  Service: Cardiovascular;  Laterality: N/A;   LEFT HEART CATH AND  CORS/GRAFTS ANGIOGRAPHY N/A 02/27/2020   Procedure: LEFT HEART CATH AND CORS/GRAFTS ANGIOGRAPHY;  Surgeon: Swazil[place], Peter M, MD;  Location: [facility] INVASIVE CV LAB;  Service: Cardiovascular;  Laterality: N/A;   TEE WITHOUT CARDIOVERSION N/A 09/28/2016   Procedure: TRANSESOPHAGEAL ECHOCARDIOGRAM (TEE);  Surgeon: Donata Clay, Theron Arista, MD;  Location: [facility] OR;  Service: Open Heart Surgery;  Laterality: N/A;   ULTRASOUND GUIDANCE FOR VASCULAR ACCESS  02/11/2017   Procedure: Ultrasound Guidance For Vascular Access;  Surgeon: Corky Crafts, MD;  Location: [facility] INVASIVE CV LAB;  Service: Cardiovascular;;    Current Medications: Current Meds  Medication Sig   amLODipine (NORVASC) 5 MG tablet Take 5 mg by mouth daily.   clopidogrel (PLAVIX) 75 MG tablet Take 1 tablet (75 mg total) by mouth daily.   furosemide (LASIX) 40 MG tablet Take 1.5 tablets (60 mg total) by mouth daily.   hydrocortisone (CORTEF) 5 MG tablet Take 5 mg by mouth daily.   isosorbide mononitrate (IMDUR) 60 MG 24 hr tablet TAKE 4 TABLETS BY MOUTH ONCE DAILY (Patient taking differently: Take 60 mg by mouth daily.)   levothyroxine (SYNTHROID) 25 MCG tablet Take 25 mcg by mouth daily.   nitroGLYCERIN (NITROSTAT) 0.4 MG SL tablet Place 1 tablet (0.4 mg total) under the tongue every 5 (five) minutes as needed for chest pain.   ondansetron (ZOFRAN-ODT) 4 MG disintegrating tablet Take 4 mg by mouth every 8 (eight) hours as needed for nausea.   pantoprazole (PROTONIX) 40 MG tablet Take 40 mg by mouth in the morning and at bedtime.    ranolazine (RANEXA) 1000 MG SR tablet Take 1 tablet by mouth twice daily   rivaroxaban (XARELTO) 2.5 MG TABS tablet Take 1 tablet by mouth twice daily (Patient taking differently: Take 2.5 mg by mouth 2 (two) times daily.)   rosuvastatin (CRESTOR) 20 MG tablet Take 1 tablet by mouth once daily   traMADol (ULTRAM) 50 MG tablet Take 200 mg by mouth 2 (two) times daily.   Vitamin D, Ergocalciferol, (DRISDOL) 1.25 MG (50000  UNIT) CAPS capsule Take 50,000 Units by mouth once a week.     Allergies:   Benadryl [diphenhydramine hcl (sleep)], Doxycycline, and Levofloxacin   Social History   Socioeconomic History   Marital status: Married    Spouse name: Not on file   Number of children: Not on file   Years of education: Not on file   Highest education level: Not on file  Occupational History   Occupation: Retired  Tobacco Use   Smoking status: Never   Smokeless tobacco: Never  Vaping Use   Vaping status: Never Used  Substance and Sexual Activity   Alcohol use: No   Drug use: No   Sexual activity: Yes    Birth control/protection: None  Other Topics Concern   Not on file  Social History Narrative   Not on file   Social Drivers of Health   Financial Resource Strain: Not on file  Food Insecurity: Not on file  Transportation Needs: Not on file  Physical Activity: Not on  file  Stress: Not on file  Social Connections: Not on file     Family History: The patient's family history includes Heart attack in his father; Heart failure in his mother. ROS:   Please see the history of present illness.    All 14 point review of systems negative except as described per history of present illness  EKGs/Labs/Other Studies Reviewed:         Recent Labs: 10/08/2022: ALT 15; NT-Pro BNP 194; TSH 2.360 10/23/2022: BUN 22; Creatinine, Ser 1.10; Potassium 4.1; Sodium 140  Recent Lipid Panel    Component Value Date/Time   CHOL 125 10/25/2020 0958   TRIG 111 10/25/2020 0958   HDL 53 10/25/2020 0958   CHOLHDL 2.4 10/25/2020 0958   CHOLHDL 2.8 02/27/2020 0343   VLDL 19 02/27/2020 0343   LDLCALC 52 10/25/2020 0958    Physical Exam:    VS:  BP 116/64 (BP Location: Right Arm, Patient Position: Sitting)   Pulse 75   Ht 5\' 6"  (1.676 m)   Wt 215 lb (97.5 kg)   SpO2 96%   BMI 34.70 kg/m     Wt Readings from Last 3 Encounters:  07/14/23 215 lb (97.5 kg)  04/08/23 213 lb (96.6 kg)  02/01/23 206 lb 6.4 oz  (93.6 kg)     GEN:  Well nourished, well developed in no acute distress HEENT: Normal NECK: No JVD; No carotid bruits LYMPHATICS: No lymphadenopathy CARDIAC: RRR, no murmurs, no rubs, no gallops RESPIRATORY:  Clear to auscultation without rales, wheezing or rhonchi  ABDOMEN: Soft, non-tender, non-distended MUSCULOSKELETAL:  No edema; No deformity  SKIN: Warm and dry LOWER EXTREMITIES: no swelling NEUROLOGIC:  Alert and oriented x 3 PSYCHIATRIC:  Normal affect   ASSESSMENT:    1. Coronary artery disease of native artery of native heart with stable angina pectoris (HCC)   2. Primary hypertension   3. Hx of CABG   4. Hyperlipidemia LDL goal <70   5. S/P CABG x 4    PLAN:    In order of problems listed above:  Coronary disease: Stable from that point review.  No new issues continue present management. Essential hypertension: Blood pressure well-controlled continue present management. Dyslipidemia, I did review his last fasting lipid profile 2022 which show LDL 52 HDL 53 we will call his primary care physician to get more up-to-date results of his cholesterol. Profound weakness fatigue and tiredness, multifactorial, echocardiogram done in January reviewed, ejection fraction preserved, continue monitoring   Medication Adjustments/Labs and Tests Ordered: Current medicines are reviewed at length with the patient today.  Concerns regarding medicines are outlined above.  No orders of the defined types were placed in this encounter.  Medication changes: No orders of the defined types were placed in this encounter.   Signed, Georgeanna Lea, MD, [facility] 07/14/2023 2:41 PM    [facility]

## 2023-07-14 NOTE — Patient Instructions (Signed)
 Medication Instructions:  Your physician recommends that you continue on your current medications as directed. Please refer to the Current Medication list given to you today.  *If you need a refill on your cardiac medications before your next appointment, please call your pharmacy*   Lab Work: None If you have labs (blood work) drawn today and your tests are completely normal, you will receive your results only by: MyChart Message (if you have MyChart) OR A paper copy in the mail If you have any lab test that is abnormal or we need to change your treatment, we will call you to review the results.   Testing/Procedures: None   Follow-Up: At [facility], you and your health needs are our priority.  As part of our continuing mission to provide you with exceptional heart care, we have created designated Provider Care Teams.  These Care Teams include your primary Cardiologist (physician) and Advanced Practice Providers (APPs -  Physician Assistants and Nurse Practitioners) who all work together to provide you with the care you need, when you need it.  We recommend signing up for the patient portal called "MyChart".  Sign up information is provided on this After Visit Summary.  MyChart is used to connect with patients for Virtual Visits (Telemedicine).  Patients are able to view lab/test results, encounter notes, upcoming appointments, etc.  Non-urgent messages can be sent to your provider as well.   To learn more about what you can do with MyChart, go to [email or web address].    Your next appointment:   4 month(s)  Provider:   Gypsy Balsam, MD    Other Instructions None

## 2023-07-19 ENCOUNTER — Ambulatory Visit: Payer: Medicare Other

## 2023-07-19 DIAGNOSIS — E538 Deficiency of other specified B group vitamins: Secondary | ICD-10-CM | POA: Diagnosis not present

## 2023-08-02 DIAGNOSIS — E2740 Unspecified adrenocortical insufficiency: Secondary | ICD-10-CM | POA: Diagnosis not present

## 2023-08-02 DIAGNOSIS — R5382 Chronic fatigue, unspecified: Secondary | ICD-10-CM | POA: Diagnosis not present

## 2023-08-02 DIAGNOSIS — I10 Essential (primary) hypertension: Secondary | ICD-10-CM | POA: Diagnosis not present

## 2023-08-02 DIAGNOSIS — E039 Hypothyroidism, unspecified: Secondary | ICD-10-CM | POA: Diagnosis not present

## 2023-08-02 DIAGNOSIS — E782 Mixed hyperlipidemia: Secondary | ICD-10-CM | POA: Diagnosis not present

## 2023-08-02 DIAGNOSIS — R7989 Other specified abnormal findings of blood chemistry: Secondary | ICD-10-CM | POA: Diagnosis not present

## 2023-08-02 DIAGNOSIS — R7303 Prediabetes: Secondary | ICD-10-CM | POA: Diagnosis not present

## 2023-08-13 ENCOUNTER — Ambulatory Visit: Payer: Medicare Other

## 2023-08-16 DIAGNOSIS — E538 Deficiency of other specified B group vitamins: Secondary | ICD-10-CM | POA: Diagnosis not present

## 2023-08-21 DIAGNOSIS — I200 Unstable angina: Secondary | ICD-10-CM | POA: Diagnosis not present

## 2023-08-21 DIAGNOSIS — I444 Left anterior fascicular block: Secondary | ICD-10-CM | POA: Diagnosis not present

## 2023-08-21 DIAGNOSIS — E2740 Unspecified adrenocortical insufficiency: Secondary | ICD-10-CM | POA: Diagnosis not present

## 2023-08-21 DIAGNOSIS — R079 Chest pain, unspecified: Secondary | ICD-10-CM | POA: Diagnosis not present

## 2023-08-21 DIAGNOSIS — I10 Essential (primary) hypertension: Secondary | ICD-10-CM | POA: Diagnosis not present

## 2023-08-22 DIAGNOSIS — Z7901 Long term (current) use of anticoagulants: Secondary | ICD-10-CM | POA: Diagnosis not present

## 2023-08-22 DIAGNOSIS — E66811 Obesity, class 1: Secondary | ICD-10-CM | POA: Diagnosis present

## 2023-08-22 DIAGNOSIS — I444 Left anterior fascicular block: Secondary | ICD-10-CM | POA: Diagnosis not present

## 2023-08-22 DIAGNOSIS — E2740 Unspecified adrenocortical insufficiency: Secondary | ICD-10-CM | POA: Diagnosis present

## 2023-08-22 DIAGNOSIS — I25810 Atherosclerosis of coronary artery bypass graft(s) without angina pectoris: Secondary | ICD-10-CM | POA: Diagnosis not present

## 2023-08-22 DIAGNOSIS — Z23 Encounter for immunization: Secondary | ICD-10-CM | POA: Diagnosis not present

## 2023-08-22 DIAGNOSIS — I252 Old myocardial infarction: Secondary | ICD-10-CM | POA: Diagnosis not present

## 2023-08-22 DIAGNOSIS — N1831 Chronic kidney disease, stage 3a: Secondary | ICD-10-CM | POA: Diagnosis present

## 2023-08-22 DIAGNOSIS — Z8701 Personal history of pneumonia (recurrent): Secondary | ICD-10-CM | POA: Diagnosis not present

## 2023-08-22 DIAGNOSIS — Z8546 Personal history of malignant neoplasm of prostate: Secondary | ICD-10-CM | POA: Diagnosis not present

## 2023-08-22 DIAGNOSIS — Z888 Allergy status to other drugs, medicaments and biological substances status: Secondary | ICD-10-CM | POA: Diagnosis not present

## 2023-08-22 DIAGNOSIS — Z7952 Long term (current) use of systemic steroids: Secondary | ICD-10-CM | POA: Diagnosis not present

## 2023-08-22 DIAGNOSIS — E039 Hypothyroidism, unspecified: Secondary | ICD-10-CM | POA: Diagnosis present

## 2023-08-22 DIAGNOSIS — I200 Unstable angina: Secondary | ICD-10-CM | POA: Diagnosis not present

## 2023-08-22 DIAGNOSIS — I5032 Chronic diastolic (congestive) heart failure: Secondary | ICD-10-CM | POA: Diagnosis present

## 2023-08-22 DIAGNOSIS — M1990 Unspecified osteoarthritis, unspecified site: Secondary | ICD-10-CM | POA: Diagnosis present

## 2023-08-22 DIAGNOSIS — I130 Hypertensive heart and chronic kidney disease with heart failure and stage 1 through stage 4 chronic kidney disease, or unspecified chronic kidney disease: Secondary | ICD-10-CM | POA: Diagnosis present

## 2023-08-22 DIAGNOSIS — Z7902 Long term (current) use of antithrombotics/antiplatelets: Secondary | ICD-10-CM | POA: Diagnosis not present

## 2023-08-22 DIAGNOSIS — Z85828 Personal history of other malignant neoplasm of skin: Secondary | ICD-10-CM | POA: Diagnosis not present

## 2023-08-22 DIAGNOSIS — E119 Type 2 diabetes mellitus without complications: Secondary | ICD-10-CM | POA: Diagnosis present

## 2023-08-22 DIAGNOSIS — Z881 Allergy status to other antibiotic agents status: Secondary | ICD-10-CM | POA: Diagnosis not present

## 2023-08-22 DIAGNOSIS — R079 Chest pain, unspecified: Secondary | ICD-10-CM | POA: Diagnosis not present

## 2023-08-22 DIAGNOSIS — E1122 Type 2 diabetes mellitus with diabetic chronic kidney disease: Secondary | ICD-10-CM | POA: Diagnosis present

## 2023-08-22 DIAGNOSIS — Z951 Presence of aortocoronary bypass graft: Secondary | ICD-10-CM | POA: Diagnosis not present

## 2023-08-22 DIAGNOSIS — I10 Essential (primary) hypertension: Secondary | ICD-10-CM | POA: Diagnosis not present

## 2023-08-22 DIAGNOSIS — Z8673 Personal history of transient ischemic attack (TIA), and cerebral infarction without residual deficits: Secondary | ICD-10-CM | POA: Diagnosis not present

## 2023-08-22 DIAGNOSIS — Z6834 Body mass index (BMI) 34.0-34.9, adult: Secondary | ICD-10-CM | POA: Diagnosis not present

## 2023-08-22 DIAGNOSIS — I25110 Atherosclerotic heart disease of native coronary artery with unstable angina pectoris: Secondary | ICD-10-CM | POA: Diagnosis present

## 2023-08-22 DIAGNOSIS — K219 Gastro-esophageal reflux disease without esophagitis: Secondary | ICD-10-CM | POA: Diagnosis present

## 2023-08-22 DIAGNOSIS — F32A Depression, unspecified: Secondary | ICD-10-CM | POA: Diagnosis present

## 2023-08-22 DIAGNOSIS — I160 Hypertensive urgency: Secondary | ICD-10-CM | POA: Diagnosis present

## 2023-08-23 ENCOUNTER — Telehealth: Payer: Self-pay | Admitting: Cardiology

## 2023-08-23 ENCOUNTER — Ambulatory Visit: Payer: Medicare Other

## 2023-08-23 DIAGNOSIS — I10 Essential (primary) hypertension: Secondary | ICD-10-CM | POA: Diagnosis not present

## 2023-08-23 DIAGNOSIS — R079 Chest pain, unspecified: Secondary | ICD-10-CM | POA: Diagnosis not present

## 2023-08-23 NOTE — Telephone Encounter (Signed)
   Pt c/o medication issue:  1. Name of Medication:  clopidogrel  (PLAVIX ) 75 MG tablet   rivaroxaban  (XARELTO ) 2.5 MG TABS tablet    2. How are you currently taking this medication (dosage and times per day)? As written   3. Are you having a reaction (difficulty breathing--STAT)? No   4. What is your medication issue?  Tiffany with [facility] called in to ask if pt needs to be on both Plavix  and Xarelto . Please advise.

## 2023-08-23 NOTE — Telephone Encounter (Signed)
 Spoke with [facility] nurse Tiffany. Clarified that pt is supposed to be on Xarelto  and Plavix .

## 2023-08-24 DIAGNOSIS — I10 Essential (primary) hypertension: Secondary | ICD-10-CM | POA: Diagnosis not present

## 2023-08-24 DIAGNOSIS — R079 Chest pain, unspecified: Secondary | ICD-10-CM | POA: Diagnosis not present

## 2023-08-29 ENCOUNTER — Other Ambulatory Visit: Payer: Self-pay | Admitting: Cardiology

## 2023-08-30 DIAGNOSIS — I25709 Atherosclerosis of coronary artery bypass graft(s), unspecified, with unspecified angina pectoris: Secondary | ICD-10-CM | POA: Diagnosis not present

## 2023-08-30 DIAGNOSIS — I10 Essential (primary) hypertension: Secondary | ICD-10-CM | POA: Diagnosis not present

## 2023-08-30 DIAGNOSIS — Z6835 Body mass index (BMI) 35.0-35.9, adult: Secondary | ICD-10-CM | POA: Diagnosis not present

## 2023-09-06 DIAGNOSIS — E2740 Unspecified adrenocortical insufficiency: Secondary | ICD-10-CM | POA: Diagnosis not present

## 2023-09-17 ENCOUNTER — Ambulatory Visit: Payer: Medicare Other

## 2023-09-20 DIAGNOSIS — E538 Deficiency of other specified B group vitamins: Secondary | ICD-10-CM | POA: Diagnosis not present

## 2023-10-06 DIAGNOSIS — C61 Malignant neoplasm of prostate: Secondary | ICD-10-CM | POA: Diagnosis not present

## 2023-10-06 DIAGNOSIS — N3289 Other specified disorders of bladder: Secondary | ICD-10-CM | POA: Diagnosis not present

## 2023-10-20 DIAGNOSIS — M25552 Pain in left hip: Secondary | ICD-10-CM | POA: Diagnosis not present

## 2023-10-25 ENCOUNTER — Ambulatory Visit: Payer: Medicare Other

## 2023-11-01 DIAGNOSIS — E538 Deficiency of other specified B group vitamins: Secondary | ICD-10-CM | POA: Diagnosis not present

## 2023-11-08 DIAGNOSIS — M150 Primary generalized (osteo)arthritis: Secondary | ICD-10-CM | POA: Diagnosis not present

## 2023-11-08 DIAGNOSIS — K219 Gastro-esophageal reflux disease without esophagitis: Secondary | ICD-10-CM | POA: Diagnosis not present

## 2023-11-08 DIAGNOSIS — I25709 Atherosclerosis of coronary artery bypass graft(s), unspecified, with unspecified angina pectoris: Secondary | ICD-10-CM | POA: Diagnosis not present

## 2023-11-08 DIAGNOSIS — Z79899 Other long term (current) drug therapy: Secondary | ICD-10-CM | POA: Diagnosis not present

## 2023-11-08 DIAGNOSIS — E785 Hyperlipidemia, unspecified: Secondary | ICD-10-CM | POA: Diagnosis not present

## 2023-11-08 DIAGNOSIS — Z6834 Body mass index (BMI) 34.0-34.9, adult: Secondary | ICD-10-CM | POA: Diagnosis not present

## 2023-11-08 DIAGNOSIS — E538 Deficiency of other specified B group vitamins: Secondary | ICD-10-CM | POA: Diagnosis not present

## 2023-11-08 DIAGNOSIS — E2749 Other adrenocortical insufficiency: Secondary | ICD-10-CM | POA: Diagnosis not present

## 2023-11-08 DIAGNOSIS — I10 Essential (primary) hypertension: Secondary | ICD-10-CM | POA: Diagnosis not present

## 2023-11-08 DIAGNOSIS — R7303 Prediabetes: Secondary | ICD-10-CM | POA: Diagnosis not present

## 2023-11-08 DIAGNOSIS — M87052 Idiopathic aseptic necrosis of left femur: Secondary | ICD-10-CM | POA: Diagnosis not present

## 2023-11-08 DIAGNOSIS — E559 Vitamin D deficiency, unspecified: Secondary | ICD-10-CM | POA: Diagnosis not present

## 2023-11-23 ENCOUNTER — Other Ambulatory Visit: Payer: Self-pay | Admitting: Cardiology

## 2023-11-26 ENCOUNTER — Ambulatory Visit: Payer: Medicare Other

## 2023-11-29 DIAGNOSIS — E538 Deficiency of other specified B group vitamins: Secondary | ICD-10-CM | POA: Diagnosis not present

## 2023-11-30 DIAGNOSIS — H04123 Dry eye syndrome of bilateral lacrimal glands: Secondary | ICD-10-CM | POA: Diagnosis not present

## 2023-11-30 DIAGNOSIS — H10023 Other mucopurulent conjunctivitis, bilateral: Secondary | ICD-10-CM | POA: Diagnosis not present

## 2023-11-30 DIAGNOSIS — H02112 Cicatricial ectropion of right lower eyelid: Secondary | ICD-10-CM | POA: Diagnosis not present

## 2023-12-05 ENCOUNTER — Other Ambulatory Visit: Payer: Self-pay | Admitting: Cardiology

## 2023-12-05 DIAGNOSIS — Z8673 Personal history of transient ischemic attack (TIA), and cerebral infarction without residual deficits: Secondary | ICD-10-CM

## 2023-12-09 DIAGNOSIS — I252 Old myocardial infarction: Secondary | ICD-10-CM | POA: Diagnosis not present

## 2023-12-09 DIAGNOSIS — N1831 Chronic kidney disease, stage 3a: Secondary | ICD-10-CM | POA: Diagnosis not present

## 2023-12-09 DIAGNOSIS — I444 Left anterior fascicular block: Secondary | ICD-10-CM | POA: Diagnosis not present

## 2023-12-09 DIAGNOSIS — F32A Depression, unspecified: Secondary | ICD-10-CM | POA: Diagnosis not present

## 2023-12-09 DIAGNOSIS — R262 Difficulty in walking, not elsewhere classified: Secondary | ICD-10-CM | POA: Diagnosis not present

## 2023-12-09 DIAGNOSIS — K219 Gastro-esophageal reflux disease without esophagitis: Secondary | ICD-10-CM | POA: Diagnosis not present

## 2023-12-09 DIAGNOSIS — Z881 Allergy status to other antibiotic agents status: Secondary | ICD-10-CM | POA: Diagnosis not present

## 2023-12-09 DIAGNOSIS — Z888 Allergy status to other drugs, medicaments and biological substances status: Secondary | ICD-10-CM | POA: Diagnosis not present

## 2023-12-09 DIAGNOSIS — I491 Atrial premature depolarization: Secondary | ICD-10-CM | POA: Diagnosis not present

## 2023-12-09 DIAGNOSIS — E669 Obesity, unspecified: Secondary | ICD-10-CM | POA: Diagnosis not present

## 2023-12-09 DIAGNOSIS — I25700 Atherosclerosis of coronary artery bypass graft(s), unspecified, with unstable angina pectoris: Secondary | ICD-10-CM | POA: Diagnosis not present

## 2023-12-09 DIAGNOSIS — Z8673 Personal history of transient ischemic attack (TIA), and cerebral infarction without residual deficits: Secondary | ICD-10-CM | POA: Diagnosis not present

## 2023-12-09 DIAGNOSIS — M1990 Unspecified osteoarthritis, unspecified site: Secondary | ICD-10-CM | POA: Diagnosis not present

## 2023-12-09 DIAGNOSIS — Z951 Presence of aortocoronary bypass graft: Secondary | ICD-10-CM | POA: Diagnosis not present

## 2023-12-09 DIAGNOSIS — I480 Paroxysmal atrial fibrillation: Secondary | ICD-10-CM | POA: Diagnosis not present

## 2023-12-09 DIAGNOSIS — I25810 Atherosclerosis of coronary artery bypass graft(s) without angina pectoris: Secondary | ICD-10-CM | POA: Diagnosis not present

## 2023-12-09 DIAGNOSIS — Z79899 Other long term (current) drug therapy: Secondary | ICD-10-CM | POA: Diagnosis not present

## 2023-12-09 DIAGNOSIS — Z7901 Long term (current) use of anticoagulants: Secondary | ICD-10-CM | POA: Diagnosis not present

## 2023-12-09 DIAGNOSIS — Z8701 Personal history of pneumonia (recurrent): Secondary | ICD-10-CM | POA: Diagnosis not present

## 2023-12-09 DIAGNOSIS — R079 Chest pain, unspecified: Secondary | ICD-10-CM | POA: Diagnosis not present

## 2023-12-09 DIAGNOSIS — I10 Essential (primary) hypertension: Secondary | ICD-10-CM | POA: Diagnosis not present

## 2023-12-09 DIAGNOSIS — Z7902 Long term (current) use of antithrombotics/antiplatelets: Secondary | ICD-10-CM | POA: Diagnosis not present

## 2023-12-09 DIAGNOSIS — Z6834 Body mass index (BMI) 34.0-34.9, adult: Secondary | ICD-10-CM | POA: Diagnosis not present

## 2023-12-09 DIAGNOSIS — R001 Bradycardia, unspecified: Secondary | ICD-10-CM | POA: Diagnosis not present

## 2023-12-09 DIAGNOSIS — I130 Hypertensive heart and chronic kidney disease with heart failure and stage 1 through stage 4 chronic kidney disease, or unspecified chronic kidney disease: Secondary | ICD-10-CM | POA: Diagnosis not present

## 2023-12-09 DIAGNOSIS — E039 Hypothyroidism, unspecified: Secondary | ICD-10-CM | POA: Diagnosis not present

## 2023-12-09 DIAGNOSIS — E2740 Unspecified adrenocortical insufficiency: Secondary | ICD-10-CM | POA: Diagnosis not present

## 2023-12-09 DIAGNOSIS — I5032 Chronic diastolic (congestive) heart failure: Secondary | ICD-10-CM | POA: Diagnosis not present

## 2023-12-09 DIAGNOSIS — R9431 Abnormal electrocardiogram [ECG] [EKG]: Secondary | ICD-10-CM | POA: Diagnosis not present

## 2023-12-09 DIAGNOSIS — E785 Hyperlipidemia, unspecified: Secondary | ICD-10-CM | POA: Diagnosis not present

## 2023-12-09 DIAGNOSIS — I209 Angina pectoris, unspecified: Secondary | ICD-10-CM | POA: Diagnosis not present

## 2023-12-10 DIAGNOSIS — E785 Hyperlipidemia, unspecified: Secondary | ICD-10-CM | POA: Diagnosis not present

## 2023-12-10 DIAGNOSIS — I10 Essential (primary) hypertension: Secondary | ICD-10-CM | POA: Diagnosis not present

## 2023-12-10 DIAGNOSIS — I480 Paroxysmal atrial fibrillation: Secondary | ICD-10-CM | POA: Diagnosis not present

## 2023-12-10 DIAGNOSIS — R079 Chest pain, unspecified: Secondary | ICD-10-CM | POA: Diagnosis not present

## 2023-12-10 DIAGNOSIS — I25810 Atherosclerosis of coronary artery bypass graft(s) without angina pectoris: Secondary | ICD-10-CM | POA: Diagnosis not present

## 2023-12-21 DIAGNOSIS — E2740 Unspecified adrenocortical insufficiency: Secondary | ICD-10-CM | POA: Diagnosis not present

## 2023-12-22 DIAGNOSIS — I25709 Atherosclerosis of coronary artery bypass graft(s), unspecified, with unspecified angina pectoris: Secondary | ICD-10-CM | POA: Diagnosis not present

## 2023-12-27 DIAGNOSIS — E782 Mixed hyperlipidemia: Secondary | ICD-10-CM | POA: Diagnosis not present

## 2023-12-27 DIAGNOSIS — I10 Essential (primary) hypertension: Secondary | ICD-10-CM | POA: Diagnosis not present

## 2023-12-27 DIAGNOSIS — E2740 Unspecified adrenocortical insufficiency: Secondary | ICD-10-CM | POA: Diagnosis not present

## 2023-12-27 DIAGNOSIS — R5382 Chronic fatigue, unspecified: Secondary | ICD-10-CM | POA: Diagnosis not present

## 2023-12-27 DIAGNOSIS — E538 Deficiency of other specified B group vitamins: Secondary | ICD-10-CM | POA: Diagnosis not present

## 2023-12-27 DIAGNOSIS — R7303 Prediabetes: Secondary | ICD-10-CM | POA: Diagnosis not present

## 2023-12-27 DIAGNOSIS — E039 Hypothyroidism, unspecified: Secondary | ICD-10-CM | POA: Diagnosis not present

## 2023-12-27 DIAGNOSIS — E559 Vitamin D deficiency, unspecified: Secondary | ICD-10-CM | POA: Diagnosis not present

## 2023-12-31 ENCOUNTER — Ambulatory Visit: Payer: Medicare Other

## 2024-01-03 DIAGNOSIS — L570 Actinic keratosis: Secondary | ICD-10-CM | POA: Diagnosis not present

## 2024-01-03 DIAGNOSIS — L578 Other skin changes due to chronic exposure to nonionizing radiation: Secondary | ICD-10-CM | POA: Diagnosis not present

## 2024-01-03 DIAGNOSIS — L821 Other seborrheic keratosis: Secondary | ICD-10-CM | POA: Diagnosis not present

## 2024-01-03 DIAGNOSIS — D044 Carcinoma in situ of skin of scalp and neck: Secondary | ICD-10-CM | POA: Diagnosis not present

## 2024-01-04 ENCOUNTER — Other Ambulatory Visit: Payer: Self-pay | Admitting: Cardiology

## 2024-01-05 ENCOUNTER — Other Ambulatory Visit: Payer: Self-pay | Admitting: Cardiology

## 2024-01-05 DIAGNOSIS — Z8673 Personal history of transient ischemic attack (TIA), and cerebral infarction without residual deficits: Secondary | ICD-10-CM

## 2024-01-06 DIAGNOSIS — G459 Transient cerebral ischemic attack, unspecified: Secondary | ICD-10-CM | POA: Diagnosis not present

## 2024-01-06 DIAGNOSIS — R5383 Other fatigue: Secondary | ICD-10-CM | POA: Diagnosis not present

## 2024-01-06 DIAGNOSIS — M1990 Unspecified osteoarthritis, unspecified site: Secondary | ICD-10-CM | POA: Diagnosis not present

## 2024-01-06 DIAGNOSIS — I10 Essential (primary) hypertension: Secondary | ICD-10-CM | POA: Diagnosis not present

## 2024-01-06 DIAGNOSIS — Z79899 Other long term (current) drug therapy: Secondary | ICD-10-CM | POA: Diagnosis not present

## 2024-01-06 DIAGNOSIS — Z7901 Long term (current) use of anticoagulants: Secondary | ICD-10-CM | POA: Diagnosis not present

## 2024-01-06 DIAGNOSIS — R410 Disorientation, unspecified: Secondary | ICD-10-CM | POA: Diagnosis not present

## 2024-01-06 DIAGNOSIS — K219 Gastro-esophageal reflux disease without esophagitis: Secondary | ICD-10-CM | POA: Diagnosis not present

## 2024-01-06 DIAGNOSIS — Z951 Presence of aortocoronary bypass graft: Secondary | ICD-10-CM | POA: Diagnosis not present

## 2024-01-06 DIAGNOSIS — R531 Weakness: Secondary | ICD-10-CM | POA: Diagnosis not present

## 2024-01-06 DIAGNOSIS — I639 Cerebral infarction, unspecified: Secondary | ICD-10-CM | POA: Diagnosis not present

## 2024-01-06 DIAGNOSIS — I517 Cardiomegaly: Secondary | ICD-10-CM | POA: Diagnosis not present

## 2024-01-06 DIAGNOSIS — G4489 Other headache syndrome: Secondary | ICD-10-CM | POA: Diagnosis not present

## 2024-01-06 DIAGNOSIS — I480 Paroxysmal atrial fibrillation: Secondary | ICD-10-CM | POA: Diagnosis not present

## 2024-01-06 DIAGNOSIS — F32A Depression, unspecified: Secondary | ICD-10-CM | POA: Diagnosis not present

## 2024-01-06 DIAGNOSIS — Z7982 Long term (current) use of aspirin: Secondary | ICD-10-CM | POA: Diagnosis not present

## 2024-01-06 DIAGNOSIS — E669 Obesity, unspecified: Secondary | ICD-10-CM | POA: Diagnosis not present

## 2024-01-06 DIAGNOSIS — I252 Old myocardial infarction: Secondary | ICD-10-CM | POA: Diagnosis not present

## 2024-01-06 DIAGNOSIS — I444 Left anterior fascicular block: Secondary | ICD-10-CM | POA: Diagnosis not present

## 2024-01-06 DIAGNOSIS — Z6835 Body mass index (BMI) 35.0-35.9, adult: Secondary | ICD-10-CM | POA: Diagnosis not present

## 2024-01-06 DIAGNOSIS — Z8673 Personal history of transient ischemic attack (TIA), and cerebral infarction without residual deficits: Secondary | ICD-10-CM | POA: Diagnosis not present

## 2024-01-06 DIAGNOSIS — G43109 Migraine with aura, not intractable, without status migrainosus: Secondary | ICD-10-CM | POA: Diagnosis not present

## 2024-01-06 DIAGNOSIS — R29818 Other symptoms and signs involving the nervous system: Secondary | ICD-10-CM | POA: Diagnosis not present

## 2024-01-06 DIAGNOSIS — R202 Paresthesia of skin: Secondary | ICD-10-CM | POA: Diagnosis not present

## 2024-01-06 DIAGNOSIS — Z85828 Personal history of other malignant neoplasm of skin: Secondary | ICD-10-CM | POA: Diagnosis not present

## 2024-01-06 DIAGNOSIS — N179 Acute kidney failure, unspecified: Secondary | ICD-10-CM | POA: Diagnosis not present

## 2024-01-06 DIAGNOSIS — Z888 Allergy status to other drugs, medicaments and biological substances status: Secondary | ICD-10-CM | POA: Diagnosis not present

## 2024-01-06 DIAGNOSIS — R9431 Abnormal electrocardiogram [ECG] [EKG]: Secondary | ICD-10-CM | POA: Diagnosis not present

## 2024-01-06 DIAGNOSIS — I2510 Atherosclerotic heart disease of native coronary artery without angina pectoris: Secondary | ICD-10-CM | POA: Diagnosis not present

## 2024-01-06 DIAGNOSIS — R200 Anesthesia of skin: Secondary | ICD-10-CM | POA: Diagnosis not present

## 2024-01-06 DIAGNOSIS — Z881 Allergy status to other antibiotic agents status: Secondary | ICD-10-CM | POA: Diagnosis not present

## 2024-01-06 DIAGNOSIS — Z8701 Personal history of pneumonia (recurrent): Secondary | ICD-10-CM | POA: Diagnosis not present

## 2024-01-07 DIAGNOSIS — I517 Cardiomegaly: Secondary | ICD-10-CM | POA: Diagnosis not present

## 2024-01-10 ENCOUNTER — Telehealth: Payer: Self-pay

## 2024-01-10 NOTE — Patient Instructions (Signed)
 Visit Information  Thank you for taking time to visit with me today. Please don't hesitate to contact me if I can be of assistance to you.  Sudden numbness or weakness in the face, arm, or leg, especially on one side of the body. Sudden confusion, trouble speaking, or difficulty understanding speech. Sudden trouble seeing in one or both eyes. Sudden trouble walking, dizziness, loss of balance, or lack of coordination. Sudden severe headache with no known cause. Call 9-1-1 right away if you have any of these symptoms.   Patient verbalizes understanding of instructions and care plan provided today and agrees to view in MyChart. Active MyChart status and patient understanding of how to access instructions and care plan via MyChart confirmed with patient.     The patient has been provided with contact information for the care management team and has been advised to call with any health related questions or concerns.   Please call the care guide team at [phone] if you need to cancel or reschedule your appointment.   Please call the Suicide and Crisis Lifeline: 988 if you are experiencing a Mental Health or Behavioral Health Crisis or need someone to talk to.  Nandini Bogdanski J. Dashawna Delbridge RN, MSN [facility], [facility] RN Care Manager Direct Dial: [phone]  Fax: [phone] Website: [email or web address]

## 2024-01-10 NOTE — Transitions of Care (Post Inpatient/ED Visit) (Signed)
 01/10/2024  Name: Ernest Booker MRN: [id number] DOB: 1946-03-05  Today's TOC FU Call Status: Today's TOC FU Call Status:: Successful TOC FU Call Completed TOC FU Call Complete Date: 01/10/24 Patient's Name and Date of Birth confirmed.  Transition Care Management Follow-up Telephone Call Date of Discharge: 01/07/24 Discharge Facility: Other ([facility]) Name of [facility] ([facility]) [facility] Facility: [facility] Type of Discharge: Inpatient Admission Primary Inpatient Discharge Diagnosis:: Facial Numbness and HA- per spouse. No discharge diagnosis found. How have you been since you were released from the hospital?: Better Any questions or concerns?: No  Items Reviewed: Did you receive and understand the discharge instructions provided?: Yes Medications obtained,verified, and reconciled?: Yes (Medications Reviewed) Any new allergies since your discharge?: No Dietary orders reviewed?: Yes Type of Diet Ordered:: Heart Healthy Do you have support at home?: Yes People in Home [RPT]: spouse Name of Support/Comfort Primary Source: Claudette  Medications Reviewed Today: Medications Reviewed Today     Reviewed by Julian Medina, RN (Case Manager) on 01/10/24 at 1350  Med List Status: <None>   Medication Order Taking? Sig Documenting Provider Last Dose Status Informant  amLODipine  (NORVASC ) 5 MG tablet [id number] Yes Take 5 mg by mouth daily. [provider]  Active   clopidogrel  (PLAVIX ) 75 MG tablet [id number]  Take 1 tablet (75 mg total) by mouth daily.  Patient not taking: Reported on 01/10/2024   Krasowski, Robert J, MD  Active   furosemide  (LASIX ) 40 MG tablet [id number] Yes Take 1.5 tablets (60 mg total) by mouth daily.  Patient taking differently: Take 40 mg by mouth daily.   Krasowski, Robert J, MD  Active   hydrocortisone (CORTEF) 5 MG tablet [id number] Yes Take 5 mg by mouth daily.  Patient taking differently: Take 5 mg by mouth 2 (two) times daily.   [provider]  Active   isosorbide  mononitrate (IMDUR ) 60 MG 24 hr tablet [id number] Yes TAKE 4 TABLETS BY MOUTH ONCE DAILY Krasowski, Robert J, MD  Active   levothyroxine (SYNTHROID) 25 MCG tablet [id number] Yes Take 25 mcg by mouth daily. [provider]  Active   nitroGLYCERIN  (NITROSTAT ) 0.4 MG SL tablet [id number] Yes Place 1 tablet (0.4 mg total) under the tongue every 5 (five) minutes as needed for chest pain. Krasowski, Robert J, MD  Active   ondansetron  (ZOFRAN -ODT) 4 MG disintegrating tablet [id number] Yes Take 4 mg by mouth every 8 (eight) hours as needed for nausea. [provider]  Active   pantoprazole  (PROTONIX ) 40 MG tablet [id number] Yes Take 40 mg by mouth in the morning and at bedtime.  [provider]  Active Self  potassium chloride  (KLOR-CON ) 10 MEQ tablet [id number]  Take 1 tablet (10 mEq total) by mouth daily. Krasowski, Robert J, MD  Expired 04/08/23 2359   ranolazine  (RANEXA ) 1000 MG SR tablet [id number] Yes Take 1 tablet (1,000 mg total) by mouth 2 (two) times daily. Krasowski, Robert J, MD  Active   rivaroxaban  (XARELTO ) 2.5 MG TABS tablet [id number] Yes Take 1 tablet by mouth twice daily Krasowski, Robert J, MD  Active   rosuvastatin  (CRESTOR ) 20 MG tablet [id number]  Take 1 tablet by mouth once daily  Patient not taking: Reported on 01/10/2024   Krasowski, Robert J, MD  Active   traMADol  (ULTRAM ) 50 MG tablet [id number] Yes Take 200 mg by mouth 2 (two) times daily. [provider]  Active   Vitamin D, Ergocalciferol, (DRISDOL) 1.25 MG (50000 UNIT) CAPS capsule [id number] Yes Take 50,000 Units by  mouth once a week. [provider]  Active             Home Care and Equipment/Supplies: Were Home Health Services Ordered?: No Any new equipment or medical supplies ordered?: No  Functional Questionnaire: Do you need assistance with bathing/showering or dressing?: No Do you need assistance with meal preparation?: No Do you need  assistance with eating?: No Do you have difficulty maintaining continence: No Do you need assistance with getting out of bed/getting out of a chair/moving?: No Do you have difficulty managing or taking your medications?: No  Follow up appointments reviewed: PCP Follow-up appointment confirmed?: Yes Date of PCP follow-up appointment?: 01/31/24 Follow-up Provider: Dr. Jefferey Specialist [facility] Follow-up appointment confirmed?: No Reason Specialist Follow-Up Not Confirmed: Patient has Specialist Provider Number and will Call for Appointment Do you need transportation to your follow-up appointment?: No Do you understand care options if your condition(s) worsen?: Yes-patient verbalized understanding  SDOH Interventions Today    Flowsheet Row Most Recent Value  SDOH Interventions   Food Insecurity Interventions Intervention Not Indicated  Housing Interventions Intervention Not Indicated  Transportation Interventions Intervention Not Indicated  Utilities Interventions Intervention Not Indicated   Discussed and offered 30 day TOC program.  Patient declined.    The patient has been provided with contact information for the care management team and has been advised to call with any health -related questions or concerns.  The patient verbalized understanding with current plan of care.  The patient is directed to their insurance card regarding availability of benefits coverage.   Ellakate Gonsalves J. Jemaine Prokop RN, MSN [facility], [facility] RN Care Manager Direct Dial: [phone]  Fax: [phone] Website: [email or web address]

## 2024-01-13 DIAGNOSIS — R209 Unspecified disturbances of skin sensation: Secondary | ICD-10-CM | POA: Diagnosis not present

## 2024-01-13 DIAGNOSIS — E2749 Other adrenocortical insufficiency: Secondary | ICD-10-CM | POA: Diagnosis not present

## 2024-01-13 DIAGNOSIS — Z6835 Body mass index (BMI) 35.0-35.9, adult: Secondary | ICD-10-CM | POA: Diagnosis not present

## 2024-01-19 ENCOUNTER — Other Ambulatory Visit: Payer: Self-pay

## 2024-01-19 DIAGNOSIS — R001 Bradycardia, unspecified: Secondary | ICD-10-CM | POA: Insufficient documentation

## 2024-01-20 ENCOUNTER — Ambulatory Visit: Attending: Cardiology | Admitting: Cardiology

## 2024-01-20 ENCOUNTER — Encounter: Payer: Self-pay | Admitting: Cardiology

## 2024-01-20 VITALS — BP 142/80 | HR 59 | Ht 66.0 in | Wt 218.6 lb

## 2024-01-20 DIAGNOSIS — Z951 Presence of aortocoronary bypass graft: Secondary | ICD-10-CM | POA: Diagnosis not present

## 2024-01-20 DIAGNOSIS — G459 Transient cerebral ischemic attack, unspecified: Secondary | ICD-10-CM | POA: Insufficient documentation

## 2024-01-20 DIAGNOSIS — I10 Essential (primary) hypertension: Secondary | ICD-10-CM | POA: Insufficient documentation

## 2024-01-20 DIAGNOSIS — E782 Mixed hyperlipidemia: Secondary | ICD-10-CM | POA: Diagnosis not present

## 2024-01-20 DIAGNOSIS — C61 Malignant neoplasm of prostate: Secondary | ICD-10-CM | POA: Diagnosis not present

## 2024-01-20 DIAGNOSIS — I5032 Chronic diastolic (congestive) heart failure: Secondary | ICD-10-CM | POA: Insufficient documentation

## 2024-01-20 DIAGNOSIS — I25118 Atherosclerotic heart disease of native coronary artery with other forms of angina pectoris: Secondary | ICD-10-CM | POA: Insufficient documentation

## 2024-01-20 MED ORDER — ROSUVASTATIN CALCIUM 10 MG PO TABS: 10.0000 mg | ORAL_TABLET | Freq: Every day | ORAL | 3 refills | Status: AC

## 2024-01-20 MED ORDER — ROSUVASTATIN CALCIUM 10 MG PO TABS: 10.0000 mg | ORAL_TABLET | Freq: Every day | ORAL | 3 refills | Status: DC

## 2024-01-20 NOTE — Addendum Note (Signed)
 Addended by: ARLOA PLANAS D on: 01/20/2024 10:47 AM   Modules accepted: Orders

## 2024-01-20 NOTE — Patient Instructions (Addendum)
 Medication Instructions:   START: Rosuvastatin  10mg  1 tablet daily   Lab Work: None Ordered If you have labs (blood work) drawn today and your tests are completely normal, you will receive your results only by: MyChart Message (if you have MyChart) OR A paper copy in the mail If you have any lab test that is abnormal or we need to change your treatment, we will call you to review the results.   Testing/Procedures: None Ordered   Follow-Up: At [facility], you and your health needs are our priority.  As part of our continuing mission to provide you with exceptional heart care, we have created designated Provider Care Teams.  These Care Teams include your primary Cardiologist (physician) and Advanced Practice Providers (APPs -  Physician Assistants and Nurse Practitioners) who all work together to provide you with the care you need, when you need it.  We recommend signing up for the patient portal called MyChart.  Sign up information is provided on this After Visit Summary.  MyChart is used to connect with patients for Virtual Visits (Telemedicine).  Patients are able to view lab/test results, encounter notes, upcoming appointments, etc.  Non-urgent messages can be sent to your provider as well.   To learn more about what you can do with MyChart, go to [email or web address].    Your next appointment:   3 month(s)  The format for your next appointment:   In Person  Provider:   Lamar Fitch, MD    Other Instructions NA

## 2024-01-20 NOTE — Addendum Note (Signed)
 Addended by: ARLOA PLANAS D on: 01/20/2024 11:01 AM   Modules accepted: Orders

## 2024-01-20 NOTE — Progress Notes (Signed)
 Cardiology Office Note:    Date:  01/20/2024   ID:  Ernest Booker, DOB [birth year], MRN [id number]  PCP:  Jefferey Fitch, MD  Cardiologist:  Lamar Fitch, MD    Referring MD: Jefferey Fitch, MD   No chief complaint on file. I was in the hospital  History of Present Illness:    Ernest Booker is a 78 y.o. male past medical history significant for coronary artery disease, status post coronary bypass graft done in 2019 with LIMA to LAD, SVG to diagonal, SVG to ramus SVG to PDA.  6 months later he was find to have occluded all grafts except LIMA to LAD since that time struggling with ongoing angina pectoris.  Maximal medical therapy has been implemented, repeated cardiac catheterization had been done trying to see if there is anything we can do interventional wise however that seems not to be visible.  He does have implantable loop recorder which so far did not show any new abnormalities.  Recently ended up being in the hospital because of TIA-like symptoms, extensive evaluation have been done, CT of the head as well as MRI of the head was negative for CVA.  He was discharged home surprising he was discharged home without any statin.  He is still suffering a lot he is complaining of having pain in the hip and he is thinking about potentially doing surgery.  He requested second opinion and he was referred to [facility] cardiology which I think is an excellent idea maybe they will be able to come up with some additional treatment that will help Ernest Booker.  Denies have any recent chest pain tightness squeezing pressure burning chest but his ability to exercise is limited because of hip pain  Past Medical History:  Diagnosis Date   Acute on chronic diastolic CHF (congestive heart failure) (HCC) 02/25/2020   Angina pectoris    Anginal pain    Arthritis    Atrial fibrillation (HCC) 09/15/2017   Bilateral numbness and tingling of arms and legs 10/29/2015   Bradycardia    CAD (coronary artery disease)  09/28/2016   Cardiogenic shock (HCC)    Cervical disc disease 10/29/2015   Chest pain at rest 09/15/2017   Chronic diastolic CHF (congestive heart failure) (HCC) 02/25/2020   Chronic kidney disease (CKD), stage III (moderate) (HCC) 09/15/2017   Coronary artery disease    Edema    Encounter for therapeutic drug monitoring 10/14/2016   History of TIA (transient ischemic attack) 07/27/2019   Hx of CABG    Hyperlipemia 09/24/2016   Hyperlipidemia    Hyperlipidemia LDL goal <70    Hypertension    Ischemic cardiomyopathy 10/22/2016   Myocardial infarction [facility])    Neck pain 10/29/2015   Non-ST elevation (NSTEMI) myocardial infarction [facility])    NSTEMI (non-ST elevated myocardial infarction) (HCC) 09/24/2016   Numbness in left leg 10/29/2015   Paroxysmal tachycardia (HCC)    Penetrating ulcer of aorta 10/08/2022   Pleural effusion    Presence of aortocoronary bypass graft 10/08/2016   LIMA to LAD -SVG to DIAG SVG to RAMUS SVG to PDA  All grafts occluded except LIMA based on cardiac catheterization 07/2016   Prostate cancer (HCC)    Respiratory failure (HCC)    S/P CABG x 4 10/08/2016   LIMA to LAD -SVG to DIAG SVG to RAMUS SVG to PDA  All grafts occluded except LIMA based on cardiac catheterization 07/2016   Shortness of breath    Weakness 10/29/2015   Weakness of both arms  10/29/2015    Past Surgical History:  Procedure Laterality Date   CORONARY ARTERY BYPASS GRAFT N/A 09/28/2016   Procedure: CORONARY ARTERY BYPASS GRAFTING (CABG) x four, using left internal mammary artery and right leg greater saphenous vein harvested endoscopically;  Surgeon: Fleeta Hanford Coy, MD;  Location: [facility] OR;  Service: Open Heart Surgery;  Laterality: N/A;   IR THORACENTESIS ASP PLEURAL SPACE W/IMG GUIDE  10/06/2016   LEFT HEART CATH AND CORONARY ANGIOGRAPHY N/A 09/24/2016   Procedure: Left Heart Cath and Coronary Angiography;  Surgeon: Court Dorn PARAS, MD;  Location: [facility] INVASIVE CV LAB;  Service:  Cardiovascular;  Laterality: N/A;   LEFT HEART CATH AND CORS/GRAFTS ANGIOGRAPHY N/A 02/11/2017   Procedure: LEFT HEART CATH AND CORS/GRAFTS ANGIOGRAPHY;  Surgeon: Dann Candyce RAMAN, MD;  Location: [facility] INVASIVE CV LAB;  Service: Cardiovascular;  Laterality: N/A;   LEFT HEART CATH AND CORS/GRAFTS ANGIOGRAPHY N/A 02/27/2020   Procedure: LEFT HEART CATH AND CORS/GRAFTS ANGIOGRAPHY;  Surgeon: [place], Peter M, MD;  Location[facility] INVASIVE CV LAB;  Service: Cardiovascular;  Laterality: N/A;   TEE WITHOUT CARDIOVERSION N/A 09/28/2016   Procedure: TRANSESOPHAGEAL ECHOCARDIOGRAM (TEE);  Surgeon: Fleeta Hanford, Coy, MD;  Location: [facility] OR;  Service: Open Heart Surgery;  Laterality: N/A;   ULTRASOUND GUIDANCE FOR VASCULAR ACCESS  02/11/2017   Procedure: Ultrasound Guidance For Vascular Access;  Surgeon: Dann Candyce RAMAN, MD;  Location: [facility] INVASIVE CV LAB;  Service: Cardiovascular;;    Current Medications: Current Meds  Medication Sig   amLODipine  (NORVASC ) 5 MG tablet Take 5 mg by mouth daily.   aspirin  EC 81 MG tablet Take 81 mg by mouth daily. Swallow whole.   carvedilol (COREG) 6.25 MG tablet Take 6.25 mg by mouth 2 (two) times daily with a meal.   furosemide  (LASIX ) 40 MG tablet Take 1.5 tablets (60 mg total) by mouth daily. (Patient taking differently: Take 40 mg by mouth daily.)   hydrocortisone (CORTEF) 10 MG tablet Take 10 mg by mouth daily.   isosorbide  mononitrate (IMDUR ) 60 MG 24 hr tablet TAKE 4 TABLETS BY MOUTH ONCE DAILY   levothyroxine (SYNTHROID) 25 MCG tablet Take 25 mcg by mouth daily.   nitroGLYCERIN  (NITROSTAT ) 0.4 MG SL tablet Place 1 tablet (0.4 mg total) under the tongue every 5 (five) minutes as needed for chest pain.   pantoprazole  (PROTONIX ) 40 MG tablet Take 40 mg by mouth in the morning and at bedtime.    ranolazine  (RANEXA ) 1000 MG SR tablet Take 1 tablet (1,000 mg total) by mouth 2 (two) times daily.   rivaroxaban  (XARELTO ) 2.5 MG TABS tablet Take 1 tablet by mouth twice daily    traMADol  (ULTRAM -ER) 200 MG 24 hr tablet Take 200 mg by mouth daily.   Vitamin D, Ergocalciferol, (DRISDOL) 1.25 MG (50000 UNIT) CAPS capsule Take 50,000 Units by mouth once a week.   [DISCONTINUED] traMADol  (ULTRAM ) 50 MG tablet Take 200 mg by mouth 2 (two) times daily.     Allergies:   Benadryl [diphenhydramine hcl (sleep)], Doxycycline, and Levofloxacin   Social History   Socioeconomic History   Marital status: Married    Spouse name: Not on file   Number of children: Not on file   Years of education: Not on file   Highest education level: Not on file  Occupational History   Occupation: Retired  Tobacco Use   Smoking status: Never   Smokeless tobacco: Never  Vaping Use   Vaping status: Never Used  Substance and Sexual Activity   Alcohol   use: No   Drug use: No   Sexual activity: Yes    Birth control/protection: None  Other Topics Concern   Not on file  Social History Narrative   Not on file   Social Drivers of Health   Financial Resource Strain: Not on file  Food Insecurity: No Food Insecurity (01/10/2024)   Hunger Vital Sign    Worried About Running Out of Food in the Last Year: Never true    Ran Out of Food in the Last Year: Never true  Transportation Needs: No Transportation Needs (01/10/2024)   PRAPARE - Administrator, Civil Service (Medical): No    Lack of Transportation (Non-Medical): No  Physical Activity: Not on file  Stress: Not on file  Social Connections: Not on file     Family History: The patient's family history includes Heart attack in his father; Heart failure in his mother. ROS:   Please see the history of present illness.    All 14 point review of systems negative except as described per history of present illness  EKGs/Labs/Other Studies Reviewed:         Recent Labs: No results found for requested labs within last 365 days.  Recent Lipid Panel    Component Value Date/Time   CHOL 125 10/25/2020 0958   TRIG 111  10/25/2020 0958   HDL 53 10/25/2020 0958   CHOLHDL 2.4 10/25/2020 0958   CHOLHDL 2.8 02/27/2020 0343   VLDL 19 02/27/2020 0343   LDLCALC 52 10/25/2020 0958    Physical Exam:    VS:  BP (!) 142/80   Pulse (!) 59   Ht 5' 6 (1.676 m)   Wt 218 lb 9.6 oz (99.2 kg)   SpO2 98%   BMI 35.28 kg/m     Wt Readings from Last 3 Encounters:  01/20/24 218 lb 9.6 oz (99.2 kg)  07/14/23 215 lb (97.5 kg)  04/08/23 213 lb (96.6 kg)     GEN:  Well nourished, well developed in no acute distress HEENT: Normal NECK: No JVD; No carotid bruits LYMPHATICS: No lymphadenopathy CARDIAC: RRR, no murmurs, no rubs, no gallops RESPIRATORY:  Clear to auscultation without rales, wheezing or rhonchi  ABDOMEN: Soft, non-tender, non-distended MUSCULOSKELETAL:  No edema; No deformity  SKIN: Warm and dry LOWER EXTREMITIES: no swelling NEUROLOGIC:  Alert and oriented x 3 PSYCHIATRIC:  Normal affect   ASSESSMENT:    1. Coronary artery disease of native artery of native heart with stable angina pectoris   2. Chronic diastolic CHF (congestive heart failure) (HCC)   3. Primary hypertension   4. Prostate cancer (HCC)   5. S/P CABG x 4   6. Mixed hyperlipidemia   7. Transient ischemic attack    PLAN:    In order of problems listed above:  Coronary disease stable advance, he is scheduled to have second opinion at [facility] which I think is an excellent idea in the meantime maximal medical therapy. Dyslipidemia surprisingly he was discharged home without anticholesterol medication will put him back on ranolazine  5 mg daily. Essential hypertension blood pressure well-controlled continue present management. Prostate cancer follow-up by urology, History of TIA, so far workup negative.  Continue antiplatelet therapy continue low-dose of Xarelto  2.5 twice daily.   Medication Adjustments/Labs and Tests Ordered: Current medicines are reviewed at length with the patient today.  Concerns regarding medicines are outlined  above.  No orders of the defined types were placed in this encounter.  Medication changes: No orders of the  defined types were placed in this encounter.   Signed, Lamar DOROTHA Fitch, MD, [facility] 01/20/2024 10:42 AM    [facility]

## 2024-01-24 DIAGNOSIS — E538 Deficiency of other specified B group vitamins: Secondary | ICD-10-CM | POA: Diagnosis not present

## 2024-02-04 ENCOUNTER — Ambulatory Visit: Payer: Medicare Other

## 2024-02-18 ENCOUNTER — Other Ambulatory Visit: Payer: Self-pay | Admitting: Cardiology

## 2024-02-20 ENCOUNTER — Other Ambulatory Visit: Payer: Self-pay | Admitting: Cardiology

## 2024-02-21 DIAGNOSIS — E538 Deficiency of other specified B group vitamins: Secondary | ICD-10-CM | POA: Diagnosis not present

## 2024-02-29 DIAGNOSIS — I2510 Atherosclerotic heart disease of native coronary artery without angina pectoris: Secondary | ICD-10-CM | POA: Diagnosis not present

## 2024-03-01 DIAGNOSIS — I2510 Atherosclerotic heart disease of native coronary artery without angina pectoris: Secondary | ICD-10-CM | POA: Diagnosis not present

## 2024-03-07 ENCOUNTER — Other Ambulatory Visit: Payer: Self-pay | Admitting: Cardiology

## 2024-03-10 ENCOUNTER — Ambulatory Visit: Payer: Medicare Other

## 2024-03-15 DIAGNOSIS — Z6834 Body mass index (BMI) 34.0-34.9, adult: Secondary | ICD-10-CM | POA: Diagnosis not present

## 2024-03-15 DIAGNOSIS — Z993 Dependence on wheelchair: Secondary | ICD-10-CM | POA: Diagnosis not present

## 2024-03-15 DIAGNOSIS — E785 Hyperlipidemia, unspecified: Secondary | ICD-10-CM | POA: Diagnosis not present

## 2024-03-15 DIAGNOSIS — I25118 Atherosclerotic heart disease of native coronary artery with other forms of angina pectoris: Secondary | ICD-10-CM | POA: Diagnosis not present

## 2024-03-15 DIAGNOSIS — Z79899 Other long term (current) drug therapy: Secondary | ICD-10-CM | POA: Diagnosis not present

## 2024-03-15 DIAGNOSIS — Z881 Allergy status to other antibiotic agents status: Secondary | ICD-10-CM | POA: Diagnosis not present

## 2024-03-15 DIAGNOSIS — Z888 Allergy status to other drugs, medicaments and biological substances status: Secondary | ICD-10-CM | POA: Diagnosis not present

## 2024-03-15 DIAGNOSIS — I25718 Atherosclerosis of autologous vein coronary artery bypass graft(s) with other forms of angina pectoris: Secondary | ICD-10-CM | POA: Diagnosis not present

## 2024-03-15 DIAGNOSIS — E7800 Pure hypercholesterolemia, unspecified: Secondary | ICD-10-CM | POA: Diagnosis not present

## 2024-03-15 DIAGNOSIS — I2510 Atherosclerotic heart disease of native coronary artery without angina pectoris: Secondary | ICD-10-CM | POA: Diagnosis not present

## 2024-03-15 DIAGNOSIS — Z951 Presence of aortocoronary bypass graft: Secondary | ICD-10-CM | POA: Diagnosis not present

## 2024-03-15 DIAGNOSIS — Z95820 Peripheral vascular angioplasty status with implants and grafts: Secondary | ICD-10-CM | POA: Diagnosis not present

## 2024-03-15 DIAGNOSIS — R9439 Abnormal result of other cardiovascular function study: Secondary | ICD-10-CM | POA: Diagnosis not present

## 2024-03-15 DIAGNOSIS — N183 Chronic kidney disease, stage 3 unspecified: Secondary | ICD-10-CM | POA: Diagnosis not present

## 2024-03-15 DIAGNOSIS — R9431 Abnormal electrocardiogram [ECG] [EKG]: Secondary | ICD-10-CM | POA: Diagnosis not present

## 2024-03-15 DIAGNOSIS — I129 Hypertensive chronic kidney disease with stage 1 through stage 4 chronic kidney disease, or unspecified chronic kidney disease: Secondary | ICD-10-CM | POA: Diagnosis not present

## 2024-03-15 DIAGNOSIS — N189 Chronic kidney disease, unspecified: Secondary | ICD-10-CM | POA: Diagnosis not present

## 2024-03-15 DIAGNOSIS — Z8546 Personal history of malignant neoplasm of prostate: Secondary | ICD-10-CM | POA: Diagnosis not present

## 2024-03-15 DIAGNOSIS — E66811 Obesity, class 1: Secondary | ICD-10-CM | POA: Diagnosis not present

## 2024-03-16 DIAGNOSIS — I129 Hypertensive chronic kidney disease with stage 1 through stage 4 chronic kidney disease, or unspecified chronic kidney disease: Secondary | ICD-10-CM | POA: Diagnosis not present

## 2024-03-16 DIAGNOSIS — I25118 Atherosclerotic heart disease of native coronary artery with other forms of angina pectoris: Secondary | ICD-10-CM | POA: Diagnosis not present

## 2024-03-16 DIAGNOSIS — E7800 Pure hypercholesterolemia, unspecified: Secondary | ICD-10-CM | POA: Diagnosis not present

## 2024-03-16 DIAGNOSIS — N183 Chronic kidney disease, stage 3 unspecified: Secondary | ICD-10-CM | POA: Diagnosis not present

## 2024-03-16 DIAGNOSIS — Z8546 Personal history of malignant neoplasm of prostate: Secondary | ICD-10-CM | POA: Diagnosis not present

## 2024-03-16 DIAGNOSIS — Z79899 Other long term (current) drug therapy: Secondary | ICD-10-CM | POA: Diagnosis not present

## 2024-03-20 DIAGNOSIS — I7143 Infrarenal abdominal aortic aneurysm, without rupture: Secondary | ICD-10-CM | POA: Diagnosis not present

## 2024-03-20 DIAGNOSIS — I255 Ischemic cardiomyopathy: Secondary | ICD-10-CM | POA: Diagnosis not present

## 2024-03-20 DIAGNOSIS — I719 Aortic aneurysm of unspecified site, without rupture: Secondary | ICD-10-CM | POA: Diagnosis not present

## 2024-03-20 DIAGNOSIS — E782 Mixed hyperlipidemia: Secondary | ICD-10-CM | POA: Diagnosis not present

## 2024-03-20 DIAGNOSIS — I480 Paroxysmal atrial fibrillation: Secondary | ICD-10-CM | POA: Diagnosis not present

## 2024-04-02 ENCOUNTER — Other Ambulatory Visit: Payer: Self-pay | Admitting: Cardiology

## 2024-04-02 DIAGNOSIS — Z8673 Personal history of transient ischemic attack (TIA), and cerebral infarction without residual deficits: Secondary | ICD-10-CM

## 2024-04-03 DIAGNOSIS — I25708 Atherosclerosis of coronary artery bypass graft(s), unspecified, with other forms of angina pectoris: Secondary | ICD-10-CM | POA: Diagnosis not present

## 2024-04-03 DIAGNOSIS — I10 Essential (primary) hypertension: Secondary | ICD-10-CM | POA: Diagnosis not present

## 2024-04-03 DIAGNOSIS — E782 Mixed hyperlipidemia: Secondary | ICD-10-CM | POA: Diagnosis not present

## 2024-04-03 DIAGNOSIS — I255 Ischemic cardiomyopathy: Secondary | ICD-10-CM | POA: Diagnosis not present

## 2024-04-03 DIAGNOSIS — Z95820 Peripheral vascular angioplasty status with implants and grafts: Secondary | ICD-10-CM | POA: Diagnosis not present

## 2024-04-06 DIAGNOSIS — R7303 Prediabetes: Secondary | ICD-10-CM | POA: Diagnosis not present

## 2024-04-06 DIAGNOSIS — H26493 Other secondary cataract, bilateral: Secondary | ICD-10-CM | POA: Diagnosis not present

## 2024-04-14 ENCOUNTER — Ambulatory Visit: Payer: Medicare Other
